# Patient Record
Sex: Male | Born: 1955 | Race: White | Hispanic: No | Smoking: Current some day smoker
Health system: Southern US, Community
[De-identification: ages and names within clinical notes are randomized; demographics above are authoritative.]

## PROBLEM LIST (undated history)

## (undated) DIAGNOSIS — L089 Local infection of the skin and subcutaneous tissue, unspecified: Secondary | ICD-10-CM

## (undated) DIAGNOSIS — C679 Malignant neoplasm of bladder, unspecified: Secondary | ICD-10-CM

## (undated) DIAGNOSIS — Z433 Encounter for attention to colostomy: Secondary | ICD-10-CM

## (undated) HISTORY — PX: ABDOMINAL SURGERY: SHX537

---

## 2019-07-24 ENCOUNTER — Other Ambulatory Visit: Payer: Self-pay

## 2019-07-24 DIAGNOSIS — Z20822 Contact with and (suspected) exposure to covid-19: Secondary | ICD-10-CM

## 2019-07-25 LAB — NOVEL CORONAVIRUS, NAA: SARS-CoV-2, NAA: NOT DETECTED

## 2019-10-17 ENCOUNTER — Emergency Department (HOSPITAL_COMMUNITY): Payer: Medicaid Other

## 2019-10-17 ENCOUNTER — Emergency Department (HOSPITAL_COMMUNITY)
Admission: EM | Admit: 2019-10-17 | Discharge: 2019-10-17 | Disposition: A | Discharge status: Home / Self Care | Payer: Medicaid Other | Attending: Emergency Medicine | Admitting: Emergency Medicine

## 2019-10-17 ENCOUNTER — Encounter (HOSPITAL_COMMUNITY): Payer: Self-pay

## 2019-10-17 ENCOUNTER — Other Ambulatory Visit: Payer: Self-pay

## 2019-10-17 DIAGNOSIS — Z590 Homelessness: Secondary | ICD-10-CM | POA: Diagnosis not present

## 2019-10-17 DIAGNOSIS — L97521 Non-pressure chronic ulcer of other part of left foot limited to breakdown of skin: Secondary | ICD-10-CM | POA: Insufficient documentation

## 2019-10-17 DIAGNOSIS — F17210 Nicotine dependence, cigarettes, uncomplicated: Secondary | ICD-10-CM | POA: Insufficient documentation

## 2019-10-17 DIAGNOSIS — Z433 Encounter for attention to colostomy: Secondary | ICD-10-CM | POA: Insufficient documentation

## 2019-10-17 DIAGNOSIS — S81802A Unspecified open wound, left lower leg, initial encounter: Secondary | ICD-10-CM

## 2019-10-17 MED ORDER — CLOTRIMAZOLE 1 % EX CREA
TOPICAL_CREAM | Freq: Two times a day (BID) | CUTANEOUS | Status: DC
  Administered 2019-10-17: 1 via TOPICAL
  Filled 2019-10-17: qty 15

## 2019-10-17 MED ORDER — HYDROCODONE-ACETAMINOPHEN 5-325 MG PO TABS
1.0000 | ORAL_TABLET | Freq: Once | ORAL | Status: AC
  Administered 2019-10-17: 19:00:00 1 via ORAL
  Filled 2019-10-17: qty 1

## 2019-10-17 MED ORDER — MUPIROCIN CALCIUM 2 % EX CREA
TOPICAL_CREAM | Freq: Two times a day (BID) | CUTANEOUS | Status: DC
  Administered 2019-10-17: 1 via TOPICAL
  Filled 2019-10-17: qty 15

## 2019-10-17 NOTE — ED Provider Notes (Signed)
[place] DEPT Provider Note   CSN: [id number] Arrival date & time: 10/17/19  1627     History Chief Complaint  Patient presents with  . Foot Pain  . Wound Check  . Colostomy Bag Change    Jamie Wilson is a 63 y.o. male.  Patient is a 63 year old male with a history of prior ostomy with colostomy bag, chronic lower extremity wounds who presents today for worsening wound on the left foot. Patient states that he was in prison until July and then has been out since then. In the last month his living situation has changed and he went to the [facility] which helped him get into a hotel however he has had difficult time paying and now he is back on the street sleeping in his wheelchair. Since this time he has not been able to do regular dressing changes of his foot and the wound is starting to get worse. He was initially using Neosporin and changing the dressing twice a day and it was almost completely healed. Also he has run out of ostomy care items and called multiple pharmacies but was unable to get any further ostomy bags. He denies any redness of the foot that radiates up but does have foot pain. He has had no fever, vomiting or abdominal pain. He has felt more tired in the last week or 2 but states he has been eating very little. He does plan to go to the [facility] on Monday so that they can hold his things as he currently has nowhere to stay. He is supposed to be on chronic medications but states he is not been on them for some time because he ran out. He has not seen Marliss Coots at [facility] for medications.  The history is provided by the patient.  Foot Pain This is a recurrent problem. Episode onset: wound has worsened over the last week. The problem occurs constantly. The problem has been gradually worsening. Associated symptoms comments: No fever, vomiting but feeling a little more tired. The symptoms are aggravated by bending and twisting. Nothing relieves the symptoms.  He has tried nothing for the symptoms.  Wound Check       History reviewed. No pertinent past medical history.  There are no problems to display for this patient.   History reviewed. No pertinent surgical history.     History reviewed. No pertinent family history.  Social History   Tobacco Use  . Smoking status: Current Some Day Smoker  . Smokeless tobacco: Never Used  Substance Use Topics  . Alcohol use: Not on file  . Drug use: Not on file    Home Medications Prior to Admission medications   Not on File    Allergies    Patient has no known allergies.  Review of Systems   Review of Systems  All other systems reviewed and are negative.   Physical Exam Updated Vital Signs BP 105/62 (BP Location: Left Arm)   Pulse 64   Temp 97.9 F (36.6 C) (Oral)   Resp 18   Ht 6\' 1"  (1.854 m)   Wt 72.6 kg   SpO2 96%   BMI 21.11 kg/m   Physical Exam Vitals and nursing note reviewed.  Constitutional:      General: He is not in acute distress.    Appearance: He is well-developed and normal weight.  HENT:     Head: Normocephalic and atraumatic.     Mouth/Throat:     Mouth: Mucous  membranes are moist.  Eyes:     Conjunctiva/sclera: Conjunctivae normal.     Pupils: Pupils are equal, round, and reactive to light.  Cardiovascular:     Rate and Rhythm: Normal rate and regular rhythm.     Heart sounds: No murmur.  Pulmonary:     Effort: Pulmonary effort is normal. No respiratory distress.     Breath sounds: Normal breath sounds. No wheezing or rales.  Abdominal:     General: There is no distension.     Palpations: Abdomen is soft.     Tenderness: There is no abdominal tenderness. There is no guarding or rebound.     Comments: Ostomy present without bag. Mild surrounding redness around ostomy site but no tenderness or induration.  Musculoskeletal:        General: No tenderness. Normal range of motion.     Cervical back: Normal range of motion and neck supple.      Right lower leg: Edema present.     Comments: Right foot swelling and edema without wounds present. On the left foot over the second and third toe there is an ulcerative lesion with malodorous skin breakdown between the toes. No significant drainage present. 2+ left DP pulse but foot is cold to touch. Capillary refill of 2-3  Skin:    General: Skin is warm and dry.     Findings: No erythema or rash.  Neurological:     General: No focal deficit present.     Mental Status: He is alert and oriented to person, place, and time. Mental status is at baseline.  Psychiatric:        Mood and Affect: Mood normal.        Behavior: Behavior normal.        Thought Content: Thought content normal.     ED Results / Procedures / Treatments   Labs (all labs ordered are listed, but only abnormal results are displayed) Labs Reviewed - No data to display  EKG None  Radiology No results found.  Procedures Procedures (including critical care time)  Medications Ordered in ED Medications  mupirocin cream (BACTROBAN) 2 % (has no administration in time range)  clotrimazole (LOTRIMIN) 1 % cream (has no administration in time range)    ED Course  I have reviewed the triage vital signs and the nursing notes.  Pertinent labs & imaging results that were available during my care of the patient were reviewed by me and considered in my medical decision making (see chart for details).    MDM Rules/Calculators/A&P                      63 year old male who is currently homeless coming in by EMS today for worsening wound on his left foot. 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 He has no evidence of cellulitis at this time and lower suspicion for osteomyelitis. There is no sign of abscess and patient does have palpable pulse with low suspicion for ischemic limb. Patient states the wound was almost healed until he lost the place he was  living and has not been providing good wound care to the area because he has been homeless. Patient is also out of ostomy supplies and has no further bags for his ostomy site. Patient is overall well-appearing. He has been seen at the [facility] and they helped him get a hotel and he plans to go  back there on Monday but has not seen the nurse practitioner there for his ongoing medical needs. Will discuss with social work to see if there is anything further we can offer the patient. A new dressing was placed on his wound with mupirocin and Lotrimin. X-rays negative for osteomyelitis. New ostomy supplies provided and placed. Patient has normal vital signs and low suspicion for acute underlying process.  6:44 PM Social work is not available till Architectural technologist.  Patient was given a list of shelters was discharged to the waiting room.  Patient plans to go to the [facility] on Monday and at the first of the year has plan to go to a boardinghouse.  No admittable findings today  Final Clinical Impression(s) / ED Diagnoses Final diagnoses:  Wound of left lower extremity, initial encounter    Rx / DC Orders ED Discharge Orders    None       Blanchie Dessert, MD 10/17/19 1846

## 2019-10-17 NOTE — ED Triage Notes (Signed)
EMS reports from hotel, worsening chronic wound to top of left foot. Unable to change colostomy bag.  BP 156/88 HR 88 RR 16 Sp02 95 RA Temp 97.0

## 2019-10-17 NOTE — Discharge Instructions (Addendum)
Apply the ointment 2 times a day and change the dressing to keep clean and dry.  Go to the [facility] with help for housing.

## 2019-10-18 NOTE — Progress Notes (Addendum)
CSW was called by the RN/AC to assit pt who is D/C'd.  CSW consulted Advance Care Supervisor who stated pt could go via taxi to [employer] [facility] [employer]. RN/CN updated and pt will go as soon as the taxi arrives.  Advanced Care Supervisor approved the $33 voucher.  CSW will continue to follow for D/C needs.  Alphonse Guild. Fleda Pagel, LCSW, LCAS, CSI Transitions of Care Clinical Social Worker Care Coordination Department Ph: [phone]

## 2019-11-21 ENCOUNTER — Emergency Department (HOSPITAL_COMMUNITY): Payer: Medicaid Other

## 2019-11-21 ENCOUNTER — Other Ambulatory Visit: Payer: Self-pay

## 2019-11-21 ENCOUNTER — Emergency Department (HOSPITAL_COMMUNITY)
Admission: EM | Admit: 2019-11-21 | Discharge: 2019-11-21 | Disposition: A | Discharge status: Home / Self Care | Payer: Medicaid Other | Attending: Emergency Medicine | Admitting: Emergency Medicine

## 2019-11-21 DIAGNOSIS — M79672 Pain in left foot: Secondary | ICD-10-CM | POA: Diagnosis not present

## 2019-11-21 DIAGNOSIS — F17210 Nicotine dependence, cigarettes, uncomplicated: Secondary | ICD-10-CM | POA: Insufficient documentation

## 2019-11-21 LAB — CBC WITH DIFFERENTIAL/PLATELET
Abs Immature Granulocytes: 0.03 10*3/uL (ref 0.00–0.07)
Basophils Absolute: 0 10*3/uL (ref 0.0–0.1)
Basophils Relative: 0 %
Eosinophils Absolute: 0.1 10*3/uL (ref 0.0–0.5)
Eosinophils Relative: 1 %
HCT: 46.9 % (ref 39.0–52.0)
Hemoglobin: 14.9 g/dL (ref 13.0–17.0)
Immature Granulocytes: 0 %
Lymphocytes Relative: 15 %
Lymphs Abs: 1.4 10*3/uL (ref 0.7–4.0)
MCH: 28.7 pg (ref 26.0–34.0)
MCHC: 31.8 g/dL (ref 30.0–36.0)
MCV: 90.4 fL (ref 80.0–100.0)
Monocytes Absolute: 0.5 10*3/uL (ref 0.1–1.0)
Monocytes Relative: 6 %
Neutro Abs: 7.4 10*3/uL (ref 1.7–7.7)
Neutrophils Relative %: 78 %
Platelets: 305 10*3/uL (ref 150–400)
RBC: 5.19 MIL/uL (ref 4.22–5.81)
RDW: 14.9 % (ref 11.5–15.5)
WBC: 9.4 10*3/uL (ref 4.0–10.5)
nRBC: 0 % (ref 0.0–0.2)

## 2019-11-21 LAB — BASIC METABOLIC PANEL
Anion gap: 10 (ref 5–15)
BUN: 22 mg/dL (ref 8–23)
CO2: 21 mmol/L — ABNORMAL LOW (ref 22–32)
Calcium: 9.4 mg/dL (ref 8.9–10.3)
Chloride: 104 mmol/L (ref 98–111)
Creatinine, Ser: 1.04 mg/dL (ref 0.61–1.24)
GFR calc Af Amer: >60 mL/min (ref >60)
GFR calc non Af Amer: >60 mL/min (ref >60)
Glucose, Bld: 100 mg/dL — ABNORMAL HIGH (ref 70–99)
Potassium: 3.9 mmol/L (ref 3.5–5.1)
Sodium: 135 mmol/L (ref 135–145)

## 2019-11-21 MED ORDER — SODIUM CHLORIDE 0.9 % IV SOLN: INTRAVENOUS | Status: DC

## 2019-11-21 NOTE — ED Provider Notes (Signed)
[place] DEPT Provider Note   CSN: [id number] Arrival date & time: 11/21/19  1334     History Chief Complaint  Patient presents with  . colostomy bag  . Foot Pain    Casten Gregorich is a 64 y.o. male.  64 year old male presents requesting a new colostomy bag.  Patient states he has had trouble getting adequate supplies.  Also has a secondary complaint of wound to his left foot.  States he has been doing self-care to this area by wrapping it.  Was seen at wound care in the past but due to insurance issues has not been able to go.  Denies any fever or chills.  No abdominal pain.        No past medical history on file.  There are no problems to display for this patient.   No past surgical history on file.     No family history on file.  Social History   Tobacco Use  . Smoking status: Current Some Day Smoker  . Smokeless tobacco: Never Used  Substance Use Topics  . Alcohol use: Not on file  . Drug use: Not on file    Home Medications Prior to Admission medications   Not on File    Allergies    Patient has no known allergies.  Review of Systems   Review of Systems  All other systems reviewed and are negative.   Physical Exam Updated Vital Signs BP 115/77 (BP Location: Left Arm)   Pulse (!) 105   Temp 98 F (36.7 C) (Oral)   Resp 18   SpO2 94%   Physical Exam Vitals and nursing note reviewed.  Constitutional:      General: He is not in acute distress.    Appearance: Normal appearance. He is well-developed. He is not toxic-appearing.  HENT:     Head: Normocephalic and atraumatic.  Eyes:     General: Lids are normal.     Conjunctiva/sclera: Conjunctivae normal.     Pupils: Pupils are equal, round, and reactive to light.  Neck:     Thyroid: No thyroid mass.     Trachea: No tracheal deviation.  Cardiovascular:     Rate and Rhythm: Normal rate and regular rhythm.     Heart sounds: Normal heart sounds. No murmur. No  gallop.   Pulmonary:     Effort: Pulmonary effort is normal. No respiratory distress.     Breath sounds: Normal breath sounds. No stridor. No decreased breath sounds, wheezing, rhonchi or rales.  Abdominal:     General: Bowel sounds are normal. There is no distension.     Palpations: Abdomen is soft.     Tenderness: There is no abdominal tenderness. There is no rebound.    Musculoskeletal:        General: No tenderness. Normal range of motion.     Cervical back: Normal range of motion and neck supple.  Skin:    General: Skin is warm and dry.     Findings: No abrasion or rash.  Neurological:     Mental Status: He is alert and oriented to person, place, and time.     GCS: GCS eye subscore is 4. GCS verbal subscore is 5. GCS motor subscore is 6.     Cranial Nerves: No cranial nerve deficit.     Sensory: No sensory deficit.  Psychiatric:        Speech: Speech normal.        Behavior: Behavior normal.  ED Results / Procedures / Treatments   Labs (all labs ordered are listed, but only abnormal results are displayed) Labs Reviewed - No data to display  EKG None  Radiology No results found.  Procedures Procedures (including critical care time)  Medications Ordered in ED Medications - No data to display  ED Course  I have reviewed the triage vital signs and the nursing notes.  Pertinent labs & imaging results that were available during my care of the patient were reviewed by me and considered in my medical decision making (see chart for details).    MDM Rules/Calculators/A&P                      Patient's colostomy bag replaced here.  Patient had negative x-rays of his left foot.  Upon further close exam there is no signs of ischemia to his second toe.  He has good sensation at the distal tip of the second toe on the left foot.  Does have some chronic skin breakdown.  We will have nurse dressed his wound and will give patient referral to the [facility] wound care  clinic as well as to the community [facility] center. Final Clinical Impression(s) / ED Diagnoses Final diagnoses:  None    Rx / DC Orders ED Discharge Orders    None       Lacretia Leigh, MD 11/21/19 1624

## 2019-11-21 NOTE — ED Notes (Signed)
Dressing applied to 2nd and 3rd toes on left foot.

## 2019-11-21 NOTE — ED Notes (Signed)
RN assisted patient with cleaning ostomy site, removing [employer] bag and cup and replacing it with appropriate ostomy bag. Ostomy stoma looks healthy, with no obvious signs of infection or abnormality.

## 2019-11-21 NOTE — ED Triage Notes (Signed)
Patient reports pain in left foot, patient reports previous infection.  Patient reports colostomy issues and has not had a bag to fit on the ostomy in a while

## 2019-11-21 NOTE — ED Notes (Signed)
Sign pad not working when pt signed, verbalized understanding instructions

## 2019-11-21 NOTE — ED Notes (Signed)
Pt not interested in leaving due to having to wait for ride, encouraged to get dressed so he can wait in lobby for ride. Sandwich and juice given upon going to lobby.

## 2019-12-13 ENCOUNTER — Emergency Department (HOSPITAL_COMMUNITY)
Admission: EM | Admit: 2019-12-13 | Discharge: 2019-12-13 | Disposition: A | Discharge status: Home / Self Care | Payer: Medicaid Other | Attending: Emergency Medicine | Admitting: Emergency Medicine

## 2019-12-13 ENCOUNTER — Encounter (HOSPITAL_COMMUNITY): Payer: Self-pay | Admitting: *Deleted

## 2019-12-13 ENCOUNTER — Emergency Department (HOSPITAL_COMMUNITY): Payer: Medicaid Other

## 2019-12-13 ENCOUNTER — Other Ambulatory Visit: Payer: Self-pay

## 2019-12-13 DIAGNOSIS — Z5189 Encounter for other specified aftercare: Secondary | ICD-10-CM

## 2019-12-13 DIAGNOSIS — Z4800 Encounter for change or removal of nonsurgical wound dressing: Secondary | ICD-10-CM | POA: Diagnosis not present

## 2019-12-13 DIAGNOSIS — Z433 Encounter for attention to colostomy: Secondary | ICD-10-CM

## 2019-12-13 DIAGNOSIS — F17200 Nicotine dependence, unspecified, uncomplicated: Secondary | ICD-10-CM | POA: Insufficient documentation

## 2019-12-13 HISTORY — DX: Local infection of the skin and subcutaneous tissue, unspecified: L08.9

## 2019-12-13 HISTORY — DX: Encounter for attention to colostomy: Z43.3

## 2019-12-13 NOTE — Discharge Instructions (Addendum)
Please call the wound care clinic now that you have your Medicaid insurance to establish an appointment for ongoing management of your left foot wound.  Please also get established with a primary care provider for ongoing evaluation and management of your health and wellbeing.   Please return to the ED or seek immediate medical attention for any new or worsening symptoms.

## 2019-12-13 NOTE — ED Provider Notes (Signed)
[place] DEPT Provider Note   CSN: [id number] Arrival date & time: 12/13/19  1416     History Chief Complaint  Patient presents with  . Out of Colostomy supplies  . Wound Eval    Jamie Wilson is a 64 y.o. male with no significant PMH outside of colostomy care and a left foot infection who presents to the ED for colostomy supplies and wound check.  Patient reported to the ED for these exact symptoms on 11/21/2019 with plan of wound care follow-up and establishing with a primary care provider.  However, patient reports that he just received his Medicaid card in the past few days and plans to establish care in the next week.  In the interim however, he has been washing and reusing his colostomy bags and is requesting supply replacement.  As for his left foot, he feels as though it has improved, however is still concerned about the possible infection.  He denies any fevers or chills, headache or dizziness, numbness or weakness, urinary abdominal pain, nausea vomiting, or other symptoms.  HPI     Past Medical History:  Diagnosis Date  . Colostomy care ([place])   . Toe infection     There are no problems to display for this patient.   Past Surgical History:  Procedure Laterality Date  . ABDOMINAL SURGERY         No family history on file.  Social History   Tobacco Use  . Smoking status: Current Some Day Smoker  . Smokeless tobacco: Never Used  Substance Use Topics  . Alcohol use: Yes  . Drug use: Yes    Types: Marijuana    Home Medications Prior to Admission medications   Not on File    Allergies    Patient has no known allergies.  Review of Systems   Review of Systems  All other systems reviewed and are negative.   Physical Exam Updated Vital Signs BP (!) 142/62 (BP Location: Right Arm)   Pulse 76   Temp 98.5 F (36.9 C) (Oral)   Resp 16   SpO2 98%   Physical Exam Vitals and nursing note reviewed. Exam conducted with a  chaperone present.  Constitutional:      General: He is not in acute distress.    Appearance: Normal appearance. He is not ill-appearing.     Comments: Malodorous.  HENT:     Head: Normocephalic and atraumatic.  Eyes:     General: No scleral icterus.    Conjunctiva/sclera: Conjunctivae normal.  Cardiovascular:     Rate and Rhythm: Normal rate and regular rhythm.     Pulses: Normal pulses.     Heart sounds: Normal heart sounds.  Pulmonary:     Effort: Pulmonary effort is normal. No respiratory distress.     Breath sounds: Normal breath sounds.  Abdominal:     Comments: Colostomy bag.  Ostomy site with appears noninfected.  No TTP.  Mildly distended abdomen.  No rigidity.  No obvious masses appreciated.  Musculoskeletal:     Comments: Left leg: Skin is flaking.  Sensation intact throughout.  Pedal pulse intact.  Difficult to assess for cap refill however given discolored toenails.  No TTP.  No swelling.  Skin:    General: Skin is dry.     Capillary Refill: Capillary refill takes less than 2 seconds.  Neurological:     Mental Status: He is alert and oriented to person, place, and time.     GCS: GCS  eye subscore is 4. GCS verbal subscore is 5. GCS motor subscore is 6.  Psychiatric:        Mood and Affect: Mood normal.        Behavior: Behavior normal.        Thought Content: Thought content normal.         ED Results / Procedures / Treatments   Labs (all labs ordered are listed, but only abnormal results are displayed) Labs Reviewed - No data to display  EKG None  Radiology DG Foot Complete Left  Result Date: 12/13/2019 CLINICAL DATA:  Healed ulcer left ankle, sores between digits EXAM: LEFT FOOT - COMPLETE 3+ VIEW COMPARISON:  11/21/2019 FINDINGS: Frontal, oblique, lateral views of the left foot are again obtained. There is diffuse osteopenia unchanged. No acute or destructive bony lesions. Specifically, no bony destruction or periosteal reaction to suggest osteomyelitis.  Soft tissues are unremarkable. IMPRESSION: 1. Diffuse osteopenia. 2. No radiographic evidence of osteomyelitis. If osteomyelitis remains a clinical concern, three-phase bone scan or MRI could be considered. Electronically Signed   By: Randa Ngo M.D.   On: 12/13/2019 16:02    Procedures Procedures (including critical care time)  Medications Ordered in ED Medications - No data to display  ED Course  I have reviewed the triage vital signs and the nursing notes.  Pertinent labs & imaging results that were available during my care of the patient were reviewed by me and considered in my medical decision making (see chart for details).    MDM Rules/Calculators/A&P                      Patient has chronic skin breakdown over left foot, however plain films obtained were negative for soft tissue gas or other concerning findings for osteomyelitis.  Suspect possible onychomycosis, but given chronicity of wound, patient will need to be evaluated by wound care for ongoing evaluation and management.  His colostomy bag and required accessories have been replaced and he was provided with additional bags.    Given that he now has his Medicaid card, he reports that he should be able to establish care with a primary care provider for ongoing care and management.  Do not suspect any acute pathology this time.  Patient is resting comfortably and in no acute distress.  No significant complaints aside from his colostomy bag concern and desire for wound recheck.  DG left foot complete demonstrates no radiographic evidence of osteomyelitis.    Strict ED return precautions discussed with the patient.  All of the evaluation and work-up results were discussed with the patient and any family at bedside. They were provided opportunity to ask any additional questions and have none at this time. They have expressed understanding of verbal discharge instructions as well as return precautions and are agreeable to the plan.      Final Clinical Impression(s) / ED Diagnoses Final diagnoses:  Colostomy care [facility])  Visit for wound check    Rx / DC Orders ED Discharge Orders    None       Corena Herter, PA-C 12/13/19 1702    Dorie Rank, MD 12/15/19 1047

## 2019-12-13 NOTE — ED Triage Notes (Signed)
Pt out of colostomy supplies and needs wounds on left toes checked

## 2020-04-03 ENCOUNTER — Encounter (HOSPITAL_COMMUNITY): Payer: Self-pay | Admitting: *Deleted

## 2020-04-03 ENCOUNTER — Emergency Department (HOSPITAL_COMMUNITY)
Admission: EM | Admit: 2020-04-03 | Discharge: 2020-04-03 | Disposition: A | Discharge status: Home / Self Care | Payer: Medicaid Other | Attending: Emergency Medicine | Admitting: Emergency Medicine

## 2020-04-03 ENCOUNTER — Other Ambulatory Visit: Payer: Self-pay

## 2020-04-03 DIAGNOSIS — Z720 Tobacco use: Secondary | ICD-10-CM | POA: Diagnosis not present

## 2020-04-03 DIAGNOSIS — Z433 Encounter for attention to colostomy: Secondary | ICD-10-CM | POA: Insufficient documentation

## 2020-04-03 DIAGNOSIS — Z5189 Encounter for other specified aftercare: Secondary | ICD-10-CM

## 2020-04-03 NOTE — ED Triage Notes (Signed)
The [place] was [place] in by [facility] [place] the city  Ambulatory  hes here because he ran out of colostomy bags

## 2020-04-03 NOTE — Discharge Instructions (Signed)
Be sure to follow-up with your physician for further care or return here for any concerning changes in your condition.

## 2020-04-03 NOTE — ED Notes (Signed)
Pt requesting colostomy bag and wafer.  Same obtained from materials and given to patient.  Pt then changed bag.

## 2020-04-03 NOTE — ED Provider Notes (Signed)
°  [place] EMERGENCY DEPARTMENT Provider Note   CSN: [id number] Arrival date & time: 04/03/20  1821     History Chief Complaint  Patient presents with   needs colostomy bags    Jamie Wilson is a 64 y.o. male.  HPI    Patient presents with request for assistance with obtaining colostomy bags. Patient has colostomy, ran out of bags.  He has no complaints, states that that is his only request. It is unclear when he ran out of asked, but he is currently using a plastic sac.  Past Medical History:  Diagnosis Date   Colostomy care (Jamie Wilson)    Toe infection     There are no problems to display for this patient.   Past Surgical History:  Procedure Laterality Date   ABDOMINAL SURGERY         No family history on file.  Social History   Tobacco Use   Smoking status: Current Some Day Smoker   Smokeless tobacco: Never Used  Substance Use Topics   Alcohol use: Yes   Drug use: Yes    Types: Marijuana    Home Medications Prior to Admission medications   Not on File    Allergies    Patient has no known allergies.  Review of Systems   Review of Systems  Constitutional: Negative for fever.  Gastrointestinal: Negative for vomiting.  Musculoskeletal:       Negative aside from HPI  Skin:       Wound as above    Physical Exam Updated Vital Signs Ht 6\' 1"  (1.854 m)    Wt 72.6 kg    BMI 21.11 kg/m   Physical Exam Vitals and nursing note reviewed.  Constitutional:      Appearance: He is not ill-appearing or diaphoretic.  HENT:     Head: Normocephalic and atraumatic.  Eyes:     General:        Right eye: No discharge.        Left eye: No discharge.  Musculoskeletal:        General: No deformity.  Skin:    Coloration: Skin is not jaundiced.  Neurological:     Mental Status: He is alert.  Psychiatric:        Mood and Affect: Mood normal.        Behavior: Behavior normal.     Procedures Procedures (including critical care  time)  Medications Ordered in ED Medications - No data to display  ED Course  I have reviewed the triage vital signs and the nursing notes.  Pertinent labs & imaging results that were available during my care of the patient were reviewed by me and considered in my medical decision making (see chart for details).   Patient with a history of colostomy now presents with request for assistance with wound care.  Patient has no complaints, specifically denying any other issues, concerns, requests.  On our exam he is awake, alert, sitting upright, speaking clearly, in no distress, hemodynamically unremarkable. Given his denial of other complaints, absence of hemodynamic instability or distress, patient's request was accommodated with assistance of nursing staff and he was discharged with referral to our wound care center for further care.   Final Clinical Impression(s) / ED Diagnoses Final diagnoses:  Encounter for wound care     Carmin Muskrat, MD 04/03/20 2051

## 2020-06-26 ENCOUNTER — Encounter (HOSPITAL_COMMUNITY): Payer: Self-pay | Admitting: Emergency Medicine

## 2020-06-26 ENCOUNTER — Emergency Department (HOSPITAL_COMMUNITY)
Admission: EM | Admit: 2020-06-26 | Discharge: 2020-06-27 | Disposition: A | Discharge status: Home / Self Care | Payer: Medicaid Other | Attending: Emergency Medicine | Admitting: Emergency Medicine

## 2020-06-26 ENCOUNTER — Other Ambulatory Visit: Payer: Self-pay

## 2020-06-26 DIAGNOSIS — R300 Dysuria: Secondary | ICD-10-CM | POA: Diagnosis not present

## 2020-06-26 DIAGNOSIS — N3000 Acute cystitis without hematuria: Secondary | ICD-10-CM

## 2020-06-26 DIAGNOSIS — Z933 Colostomy status: Secondary | ICD-10-CM | POA: Insufficient documentation

## 2020-06-26 DIAGNOSIS — F17200 Nicotine dependence, unspecified, uncomplicated: Secondary | ICD-10-CM | POA: Diagnosis not present

## 2020-06-26 NOTE — ED Triage Notes (Addendum)
Pt states his colostomy bag fell off last night and he is using a trash bag.  States his landlord ordered more supplies but they won't come until next week.  Also reports burning when he self caths.  Denies fever and chills.

## 2020-06-26 NOTE — ED Notes (Signed)
Called Pt for room multiple times and no answer

## 2020-06-27 ENCOUNTER — Encounter (HOSPITAL_COMMUNITY): Payer: Self-pay | Admitting: Emergency Medicine

## 2020-06-27 LAB — URINALYSIS, ROUTINE W REFLEX MICROSCOPIC
Bilirubin Urine: NEGATIVE
Glucose, UA: NEGATIVE mg/dL
Ketones, ur: 5 mg/dL — AB
Nitrite: NEGATIVE
Protein, ur: >=300 mg/dL — AB
Specific Gravity, Urine: 1.015 (ref 1.005–1.030)
pH: 7 (ref 5.0–8.0)

## 2020-06-27 MED ORDER — CEPHALEXIN 500 MG PO CAPS: 500.0000 mg | ORAL_CAPSULE | Freq: Four times a day (QID) | ORAL | 0 refills | Status: DC

## 2020-06-27 MED ORDER — CEPHALEXIN 250 MG PO CAPS
500.0000 mg | ORAL_CAPSULE | Freq: Once | ORAL | Status: DC
  Filled 2020-06-27: qty 2

## 2020-06-27 NOTE — ED Notes (Signed)
Pt given ostomy supplies.

## 2020-06-27 NOTE — ED Provider Notes (Signed)
[facility] EMERGENCY DEPARTMENT Provider Note   CSN: [id number] Arrival date & time: 06/26/20  1314     History Chief Complaint  Patient presents with  . COLOSTOMY SUPPLIES  . burning with urination    Jamie Wilson is a 64 y.o. male.  The history is provided by the patient.  Illness Location:  Ostomy  Quality:  Has not supplies to cover the ostomy  Severity:  Mild Onset quality:  Gradual Duration:  1 day Timing:  Constant Progression:  Unchanged Chronicity:  New Context:  Also has burning with urination  Relieved by:  Nothing Worsened by:  Nothing  Ineffective treatments:  None tried  Associated symptoms: no abdominal pain, no chest pain, no congestion, no cough, no diarrhea, no ear pain, no fatigue, no fever, no headaches, no loss of consciousness, no myalgias, no nausea, no rash, no rhinorrhea, no shortness of breath, no sore throat, no vomiting and no wheezing   Risk factors:  Patient self catheterizes       Past Medical History:  Diagnosis Date  . Colostomy care ([place])   . Toe infection     There are no problems to display for this patient.   Past Surgical History:  Procedure Laterality Date  . ABDOMINAL SURGERY         History reviewed. No pertinent family history.  Social History   Tobacco Use  . Smoking status: Current Some Day Smoker  . Smokeless tobacco: Never Used  Substance Use Topics  . Alcohol use: Yes  . Drug use: Yes    Types: Marijuana    Home Medications Prior to Admission medications   Not on File    Allergies    Patient has no known allergies.  Review of Systems   Review of Systems  Constitutional: Negative for fatigue and fever.  HENT: Negative for congestion, ear pain, rhinorrhea and sore throat.   Eyes: Negative for visual disturbance.  Respiratory: Negative for cough, shortness of breath and wheezing.   Cardiovascular: Negative for chest pain.  Gastrointestinal: Negative for abdominal pain,  diarrhea, nausea and vomiting.  Genitourinary: Positive for dysuria.  Musculoskeletal: Negative for myalgias.  Skin: Negative for rash.  Neurological: Negative for loss of consciousness and headaches.  Psychiatric/Behavioral: Negative for agitation.  All other systems reviewed and are negative.   Physical Exam Updated Vital Signs BP 116/79 (BP Location: Left Arm)   Pulse (!) 108   Temp 98.2 F (36.8 C) (Oral)   Resp 16   Ht 6\' 1"  (1.854 m)   Wt 68 kg   SpO2 95%   BMI 19.79 kg/m   Physical Exam Vitals and nursing note reviewed.  Constitutional:      General: He is not in acute distress.    Appearance: Normal appearance.  HENT:     Head: Normocephalic and atraumatic.     Nose: Nose normal.  Eyes:     Conjunctiva/sclera: Conjunctivae normal.     Pupils: Pupils are equal, round, and reactive to light.  Cardiovascular:     Rate and Rhythm: Normal rate and regular rhythm.     Pulses: Normal pulses.     Heart sounds: Normal heart sounds.  Pulmonary:     Effort: Pulmonary effort is normal.     Breath sounds: Normal breath sounds.  Abdominal:     General: Abdomen is flat. Bowel sounds are normal.     Palpations: Abdomen is soft.     Tenderness: There is no abdominal tenderness.  Musculoskeletal:        General: Normal range of motion.     Cervical back: Normal range of motion and neck supple.  Skin:    General: Skin is warm and dry.     Capillary Refill: Capillary refill takes less than 2 seconds.  Neurological:     General: No focal deficit present.     Mental Status: He is alert and oriented to person, place, and time.  Psychiatric:        Mood and Affect: Mood normal.        Behavior: Behavior normal.     ED Results / Procedures / Treatments   Labs (all labs ordered are listed, but only abnormal results are displayed) Results for orders placed or performed during the hospital encounter of 06/26/20  Urinalysis, Routine w reflex microscopic Urine, Catheterized    Result Value Ref Range   Color, Urine AMBER (A) YELLOW   APPearance CLOUDY (A) CLEAR   Specific Gravity, Urine 1.015 1.005 - 1.030   pH 7.0 5.0 - 8.0   Glucose, UA NEGATIVE NEGATIVE mg/dL   Hgb urine dipstick SMALL (A) NEGATIVE   Bilirubin Urine NEGATIVE NEGATIVE   Ketones, ur 5 (A) NEGATIVE mg/dL   Protein, ur >=300 (A) NEGATIVE mg/dL   Nitrite NEGATIVE NEGATIVE   Leukocytes,Ua TRACE (A) NEGATIVE   RBC / HPF 21-50 0 - 5 RBC/hpf   WBC, UA 0-5 0 - 5 WBC/hpf   Bacteria, UA RARE (A) NONE SEEN   Squamous Epithelial / LPF 0-5 0 - 5   WBC Clumps PRESENT    Mucus PRESENT    No results found.  Radiology No results found.  Procedures Procedures (including critical care time)  Medications Ordered in ED Medications - No data to display  ED Course  I have reviewed the triage vital signs and the nursing notes.  Pertinent labs & imaging results that were available during my care of the patient were reviewed by me and considered in my medical decision making (see chart for details).  Supplies given in the ED.    Jamie Wilson was evaluated in Emergency Department on 06/27/2020 for the symptoms described in the history of present illness. He was evaluated in the context of the global COVID-19 pandemic, which necessitated consideration that the patient might be at risk for infection with the SARS-CoV-2 virus that causes COVID-19. Institutional protocols and algorithms that pertain to the evaluation of patients at risk for COVID-19 are in a state of rapid change based on information released by regulatory bodies including the CDC and federal and state organizations. These policies and algorithms were followed during the patient's care in the ED.  Final Clinical Impression(s) / ED Diagnoses Return for intractable cough, coughing up blood,fevers >100.4 unrelieved by medication, shortness of breath, intractable vomiting, chest pain, shortness of breath, weakness,numbness, changes in speech,  facial asymmetry,abdominal pain, passing out,Inability to tolerate liquids or food, cough, altered mental status or any concerns. No signs of systemic illness or infection. The patient is nontoxic-appearing on exam and vital signs are within normal limits.   I have reviewed the triage vital signs and the nursing notes. Pertinent labs &imaging results that were available during my care of the patient were reviewed by me and considered in my medical decision making (see chart for details).After history, exam, and medical workup I feel the patient has beenappropriately medically screened and is safe for discharge home. Pertinent diagnoses were discussed with the patient. Patient was given return precautions.  Alianys Chacko, MD 06/27/20 0511

## 2020-07-20 ENCOUNTER — Emergency Department (HOSPITAL_COMMUNITY)
Admission: EM | Admit: 2020-07-20 | Discharge: 2020-07-20 | Disposition: A | Discharge status: Home / Self Care | Payer: Medicaid Other | Attending: Emergency Medicine | Admitting: Emergency Medicine

## 2020-07-20 ENCOUNTER — Encounter (HOSPITAL_COMMUNITY): Payer: Self-pay

## 2020-07-20 DIAGNOSIS — S40861A Insect bite (nonvenomous) of right upper arm, initial encounter: Secondary | ICD-10-CM | POA: Diagnosis not present

## 2020-07-20 DIAGNOSIS — Y9289 Other specified places as the place of occurrence of the external cause: Secondary | ICD-10-CM | POA: Diagnosis not present

## 2020-07-20 DIAGNOSIS — Y998 Other external cause status: Secondary | ICD-10-CM | POA: Insufficient documentation

## 2020-07-20 DIAGNOSIS — W57XXXA Bitten or stung by nonvenomous insect and other nonvenomous arthropods, initial encounter: Secondary | ICD-10-CM | POA: Insufficient documentation

## 2020-07-20 DIAGNOSIS — S40862A Insect bite (nonvenomous) of left upper arm, initial encounter: Secondary | ICD-10-CM | POA: Diagnosis not present

## 2020-07-20 DIAGNOSIS — N390 Urinary tract infection, site not specified: Secondary | ICD-10-CM | POA: Diagnosis not present

## 2020-07-20 DIAGNOSIS — Z433 Encounter for attention to colostomy: Secondary | ICD-10-CM | POA: Diagnosis not present

## 2020-07-20 DIAGNOSIS — S80862A Insect bite (nonvenomous), left lower leg, initial encounter: Secondary | ICD-10-CM | POA: Insufficient documentation

## 2020-07-20 DIAGNOSIS — F17200 Nicotine dependence, unspecified, uncomplicated: Secondary | ICD-10-CM | POA: Diagnosis not present

## 2020-07-20 DIAGNOSIS — S80861A Insect bite (nonvenomous), right lower leg, initial encounter: Secondary | ICD-10-CM | POA: Diagnosis not present

## 2020-07-20 DIAGNOSIS — Z933 Colostomy status: Secondary | ICD-10-CM

## 2020-07-20 DIAGNOSIS — Y9389 Activity, other specified: Secondary | ICD-10-CM | POA: Insufficient documentation

## 2020-07-20 LAB — URINALYSIS, ROUTINE W REFLEX MICROSCOPIC
Bilirubin Urine: NEGATIVE
Glucose, UA: NEGATIVE mg/dL
Ketones, ur: NEGATIVE mg/dL
Nitrite: POSITIVE — AB
Protein, ur: 100 mg/dL — AB
RBC / HPF: >50 RBC/hpf — ABNORMAL HIGH (ref 0–5)
Specific Gravity, Urine: 1.017 (ref 1.005–1.030)
WBC, UA: >50 WBC/hpf — ABNORMAL HIGH (ref 0–5)
pH: 6 (ref 5.0–8.0)

## 2020-07-20 MED ORDER — CEFTRIAXONE SODIUM 1 G IJ SOLR: 1.0000 g | Freq: Once | INTRAMUSCULAR | 0 refills | Status: AC

## 2020-07-20 MED ORDER — SULFAMETHOXAZOLE-TRIMETHOPRIM 800-160 MG PO TABS: 1.0000 | ORAL_TABLET | Freq: Two times a day (BID) | ORAL | 0 refills | Status: DC

## 2020-07-20 MED ORDER — CEFTRIAXONE SODIUM 500 MG IJ SOLR
1000.0000 mg | Freq: Once | INTRAMUSCULAR | Status: AC
  Administered 2020-07-20: 1000 mg via INTRAMUSCULAR
  Filled 2020-07-20: qty 1000

## 2020-07-20 MED ORDER — LIDOCAINE HCL (PF) 1 % IJ SOLN
INTRAMUSCULAR | Status: AC
  Administered 2020-07-20: 2 mL
  Filled 2020-07-20: qty 5

## 2020-07-20 NOTE — ED Provider Notes (Signed)
[facility] EMERGENCY DEPARTMENT Provider Note   CSN: [id number] Arrival date & time: 07/20/20  1528     History Chief Complaint  Patient presents with  . colostomy bag change  . Rash  . Dysuria    Rockne Dearinger is a 64 y.o. male.  Pt reports he has bedbug bites.  He reports he needs colostomy supplies.  Pt reports he is taking keflex for a uti but thinks it is worse.   The history is provided by the patient. No language interpreter was used.  Rash Location:  Full body Quality: itchiness   Timing:  Constant Progression:  Worsening Chronicity:  New Relieved by:  Nothing Worsened by:  Nothing Associated symptoms: no abdominal pain   Dysuria Presenting symptoms: dysuria   Associated symptoms: no abdominal pain        Past Medical History:  Diagnosis Date  . 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 Toe infection     There are no problems to display for this patient.   Past Surgical History:  Procedure Laterality Date  . ABDOMINAL SURGERY         No family history on file.  Social History   Tobacco Use  . Smoking status: Current Some Day Smoker  . Smokeless tobacco: Never Used  Substance Use Topics  . Alcohol use: Yes  . Drug use: Yes    Types: Marijuana    Home Medications Prior to Admission medications   Medication Sig Start Date End Date Taking? Authorizing Provider  cephALEXin (KEFLEX) 500 MG capsule Take 1 capsule (500 mg total) by mouth 4 (four) times daily. 06/27/20   Palumbo, April, MD    Allergies    Patient has no known allergies.  Review of Systems   Review of Systems  Gastrointestinal: Negative for abdominal pain.  Genitourinary: Positive for dysuria.  Skin: Positive for rash.  All other systems reviewed and are negative.   Physical Exam Updated Vital Signs BP 103/71   Pulse 83   Temp 98.1 F (36.7 C) (Oral)   Resp 17   SpO2 95%   Physical Exam Vitals and nursing note reviewed.  Constitutional:      Appearance: He is  well-developed.  HENT:     Head: Normocephalic and atraumatic.  Eyes:     Conjunctiva/sclera: Conjunctivae normal.  Cardiovascular:     Rate and Rhythm: Normal rate and regular rhythm.     Heart sounds: No murmur heard.   Pulmonary:     Effort: Pulmonary effort is normal. No respiratory distress.     Breath sounds: Normal breath sounds.  Abdominal:     General: Abdomen is flat.     Palpations: Abdomen is soft.     Tenderness: There is no abdominal tenderness.     Comments: New colostomy bag in place  Musculoskeletal:     Cervical back: Neck supple.  Skin:    General: Skin is warm.     Findings: Rash present.     Comments: Multiple bites arms and legs.   Neurological:     Mental Status: He is alert.     ED Results / Procedures / Treatments   Labs (all labs ordered are listed, but only abnormal results are displayed) Labs Reviewed  URINALYSIS, ROUTINE W REFLEX MICROSCOPIC - Abnormal; Notable for the following components:      Result Value   Color, Urine AMBER (*)    APPearance CLOUDY (*)    Hgb urine dipstick LARGE (*)  Protein, ur 100 (*)    Nitrite POSITIVE (*)    Leukocytes,Ua MODERATE (*)    RBC / HPF >50 (*)    WBC, UA >50 (*)    Bacteria, UA MANY (*)    All other components within normal limits    EKG None  Radiology No results found.  Procedures Procedures (including critical care time)  Medications Ordered in ED Medications - No data to display  ED Course  I have reviewed the triage vital signs and the nursing notes.  Pertinent labs & imaging results that were available during my care of the patient were reviewed by me and considered in my medical decision making (see chart for details).    MDM Rules/Calculators/A&P                          Pt advised to use hydrocortisone on bites.  Pt given Rocephin IM.  rx for Bactrim.  Social work will provide with supplies  Final Clinical Impression(s) / ED Diagnoses Final diagnoses:  Bug bite,  initial encounter  Urinary tract infection without hematuria, site unspecified  Colostomy in place [facility])    Rx / DC Orders ED Discharge Orders         Ordered    cefTRIAXone (ROCEPHIN) 1 g injection   Once        07/20/20 2002    sulfamethoxazole-trimethoprim (BACTRIM DS) 800-160 MG tablet  2 times daily        07/20/20 2003        An After Visit Summary was printed and given to the patient.    Sidney Ace 07/20/20 2003    Tegeler, Gwenyth Allegra, MD 07/20/20 2114

## 2020-07-20 NOTE — ED Notes (Signed)
Pt provided with 5 new ostomy bags and supplies. Reviewed discharge instructions. Ambulatory to waiting room.

## 2020-07-20 NOTE — Progress Notes (Signed)
°   07/20/20 2015  TOC ED Mini Assessment  TOC Time spent with patient (minutes): 30  PING Used in TOC Assessment No  Admission or Readmission Diverted Yes  Interventions which prevented an admission or readmission Other (must enter comment) (Colostomy bags)  What brought you to the Emergency Department?  Needs colostomy supplies  Barriers to Discharge Barriers Resolved  Barrier interventions Provided colostomy supplies  Means of departure Not know

## 2020-07-20 NOTE — ED Triage Notes (Signed)
Pt requesting a new colotomy bag, currently has trash bag taped to abd, pt also reports burning with urination and a rash all over his arms

## 2020-07-20 NOTE — Discharge Instructions (Addendum)
Return if any problems.

## 2020-07-20 NOTE — Progress Notes (Signed)
TOC Team provided Pt with a few colostomy bags from supply on hand.  CSW explained to Pt that supply was very limited and it is not something that is normally kept on hand for distribution.    CSW discussed with Pt the importance of reaching out to [facility] directly to order supplies, Pt acknowledged having information previously.  Pt requested food and drink before d/c.  CSW supplied sack lunch and soda to Pt.

## 2020-07-20 NOTE — ED Notes (Signed)
Pt given new colostomy bag and skin cleaned

## 2020-07-22 ENCOUNTER — Telehealth: Payer: Self-pay | Admitting: Surgery

## 2020-07-22 NOTE — Telephone Encounter (Signed)
ED CM  Contacted patient concerning assistance with colostomy management.  Patient states he needs assistance with ordering colostomy supplies, patient tells me he does not know the correct size or how to correctly management the stoma [facility].  CM discussed [facility], patient provided with number to call Monday [phone] to set up appt. For colostomy management

## 2020-08-16 ENCOUNTER — Other Ambulatory Visit: Payer: Self-pay

## 2020-08-16 ENCOUNTER — Emergency Department (HOSPITAL_COMMUNITY): Payer: Medicaid Other

## 2020-08-16 ENCOUNTER — Inpatient Hospital Stay (HOSPITAL_COMMUNITY)
Admission: EM | Admit: 2020-08-16 | Discharge: 2020-08-28 | DRG: 394 | Disposition: A | Discharge status: Home / Self Care | Payer: Medicaid Other | Attending: Internal Medicine | Admitting: Internal Medicine

## 2020-08-16 DIAGNOSIS — K2970 Gastritis, unspecified, without bleeding: Secondary | ICD-10-CM | POA: Diagnosis present

## 2020-08-16 DIAGNOSIS — R7303 Prediabetes: Secondary | ICD-10-CM | POA: Diagnosis present

## 2020-08-16 DIAGNOSIS — K9409 Other complications of colostomy: Principal | ICD-10-CM | POA: Diagnosis present

## 2020-08-16 DIAGNOSIS — I319 Disease of pericardium, unspecified: Secondary | ICD-10-CM | POA: Diagnosis present

## 2020-08-16 DIAGNOSIS — D649 Anemia, unspecified: Secondary | ICD-10-CM | POA: Diagnosis present

## 2020-08-16 DIAGNOSIS — E872 Acidosis: Secondary | ICD-10-CM | POA: Diagnosis not present

## 2020-08-16 DIAGNOSIS — Z681 Body mass index (BMI) 19 or less, adult: Secondary | ICD-10-CM | POA: Diagnosis not present

## 2020-08-16 DIAGNOSIS — J9811 Atelectasis: Secondary | ICD-10-CM | POA: Diagnosis present

## 2020-08-16 DIAGNOSIS — Z87442 Personal history of urinary calculi: Secondary | ICD-10-CM

## 2020-08-16 DIAGNOSIS — I208 Other forms of angina pectoris: Secondary | ICD-10-CM | POA: Diagnosis present

## 2020-08-16 DIAGNOSIS — N138 Other obstructive and reflux uropathy: Secondary | ICD-10-CM | POA: Diagnosis present

## 2020-08-16 DIAGNOSIS — Z20822 Contact with and (suspected) exposure to covid-19: Secondary | ICD-10-CM | POA: Diagnosis present

## 2020-08-16 DIAGNOSIS — N200 Calculus of kidney: Secondary | ICD-10-CM

## 2020-08-16 DIAGNOSIS — Z23 Encounter for immunization: Secondary | ICD-10-CM

## 2020-08-16 DIAGNOSIS — N401 Enlarged prostate with lower urinary tract symptoms: Secondary | ICD-10-CM | POA: Diagnosis present

## 2020-08-16 DIAGNOSIS — Y929 Unspecified place or not applicable: Secondary | ICD-10-CM

## 2020-08-16 DIAGNOSIS — T83511A Infection and inflammatory reaction due to indwelling urethral catheter, initial encounter: Secondary | ICD-10-CM | POA: Diagnosis present

## 2020-08-16 DIAGNOSIS — D72829 Elevated white blood cell count, unspecified: Secondary | ICD-10-CM

## 2020-08-16 DIAGNOSIS — K56609 Unspecified intestinal obstruction, unspecified as to partial versus complete obstruction: Secondary | ICD-10-CM | POA: Diagnosis present

## 2020-08-16 DIAGNOSIS — R338 Other retention of urine: Secondary | ICD-10-CM | POA: Diagnosis present

## 2020-08-16 DIAGNOSIS — I309 Acute pericarditis, unspecified: Secondary | ICD-10-CM | POA: Diagnosis not present

## 2020-08-16 DIAGNOSIS — B9620 Unspecified Escherichia coli [E. coli] as the cause of diseases classified elsewhere: Secondary | ICD-10-CM | POA: Diagnosis present

## 2020-08-16 DIAGNOSIS — F17290 Nicotine dependence, other tobacco product, uncomplicated: Secondary | ICD-10-CM | POA: Diagnosis present

## 2020-08-16 DIAGNOSIS — R079 Chest pain, unspecified: Secondary | ICD-10-CM | POA: Diagnosis not present

## 2020-08-16 DIAGNOSIS — N179 Acute kidney failure, unspecified: Secondary | ICD-10-CM | POA: Diagnosis present

## 2020-08-16 DIAGNOSIS — R636 Underweight: Secondary | ICD-10-CM | POA: Diagnosis present

## 2020-08-16 DIAGNOSIS — I700 Atherosclerosis of aorta: Secondary | ICD-10-CM | POA: Diagnosis present

## 2020-08-16 DIAGNOSIS — E871 Hypo-osmolality and hyponatremia: Secondary | ICD-10-CM | POA: Diagnosis not present

## 2020-08-16 DIAGNOSIS — R739 Hyperglycemia, unspecified: Secondary | ICD-10-CM | POA: Diagnosis not present

## 2020-08-16 DIAGNOSIS — R1110 Vomiting, unspecified: Secondary | ICD-10-CM

## 2020-08-16 DIAGNOSIS — F1290 Cannabis use, unspecified, uncomplicated: Secondary | ICD-10-CM | POA: Diagnosis present

## 2020-08-16 DIAGNOSIS — K567 Ileus, unspecified: Secondary | ICD-10-CM | POA: Diagnosis present

## 2020-08-16 DIAGNOSIS — Y738 Miscellaneous gastroenterology and urology devices associated with adverse incidents, not elsewhere classified: Secondary | ICD-10-CM | POA: Diagnosis present

## 2020-08-16 DIAGNOSIS — N390 Urinary tract infection, site not specified: Secondary | ICD-10-CM | POA: Diagnosis present

## 2020-08-16 DIAGNOSIS — F1490 Cocaine use, unspecified, uncomplicated: Secondary | ICD-10-CM | POA: Diagnosis present

## 2020-08-16 DIAGNOSIS — K435 Parastomal hernia without obstruction or  gangrene: Secondary | ICD-10-CM | POA: Diagnosis present

## 2020-08-16 DIAGNOSIS — R3989 Other symptoms and signs involving the genitourinary system: Secondary | ICD-10-CM | POA: Diagnosis not present

## 2020-08-16 LAB — CBC WITH DIFFERENTIAL/PLATELET
Abs Immature Granulocytes: 0.06 10*3/uL (ref 0.00–0.07)
Basophils Absolute: 0.1 10*3/uL (ref 0.0–0.1)
Basophils Relative: 1 %
Eosinophils Absolute: 0.5 10*3/uL (ref 0.0–0.5)
Eosinophils Relative: 3 %
HCT: 49.5 % (ref 39.0–52.0)
Hemoglobin: 15.6 g/dL (ref 13.0–17.0)
Immature Granulocytes: 0 %
Lymphocytes Relative: 3 %
Lymphs Abs: 0.6 10*3/uL — ABNORMAL LOW (ref 0.7–4.0)
MCH: 29.5 pg (ref 26.0–34.0)
MCHC: 31.5 g/dL (ref 30.0–36.0)
MCV: 93.8 fL (ref 80.0–100.0)
Monocytes Absolute: 0.9 10*3/uL (ref 0.1–1.0)
Monocytes Relative: 6 %
Neutro Abs: 14.6 10*3/uL — ABNORMAL HIGH (ref 1.7–7.7)
Neutrophils Relative %: 87 %
Platelets: 348 10*3/uL (ref 150–400)
RBC: 5.28 MIL/uL (ref 4.22–5.81)
RDW: 13.6 % (ref 11.5–15.5)
WBC: 16.7 10*3/uL — ABNORMAL HIGH (ref 4.0–10.5)
nRBC: 0 % (ref 0.0–0.2)

## 2020-08-16 LAB — COMPREHENSIVE METABOLIC PANEL
ALT: 15 U/L (ref 0–44)
AST: 19 U/L (ref 15–41)
Albumin: 3.8 g/dL (ref 3.5–5.0)
Alkaline Phosphatase: 93 U/L (ref 38–126)
Anion gap: 11 (ref 5–15)
BUN: 31 mg/dL — ABNORMAL HIGH (ref 8–23)
CO2: 29 mmol/L (ref 22–32)
Calcium: 14.5 mg/dL (ref 8.9–10.3)
Chloride: 104 mmol/L (ref 98–111)
Creatinine, Ser: 1.48 mg/dL — ABNORMAL HIGH (ref 0.61–1.24)
GFR, Estimated: 49 mL/min — ABNORMAL LOW (ref >60)
Glucose, Bld: 143 mg/dL — ABNORMAL HIGH (ref 70–99)
Potassium: 4 mmol/L (ref 3.5–5.1)
Sodium: 144 mmol/L (ref 135–145)
Total Bilirubin: 0.5 mg/dL (ref 0.3–1.2)
Total Protein: 8.7 g/dL — ABNORMAL HIGH (ref 6.5–8.1)

## 2020-08-16 LAB — RESPIRATORY PANEL BY RT PCR (FLU A&B, COVID)
Influenza A by PCR: NEGATIVE
Influenza B by PCR: NEGATIVE
SARS Coronavirus 2 by RT PCR: NEGATIVE

## 2020-08-16 LAB — LIPASE, BLOOD: Lipase: 43 U/L (ref 11–51)

## 2020-08-16 LAB — POC OCCULT BLOOD, ED: Fecal Occult Bld: NEGATIVE

## 2020-08-16 LAB — PROTIME-INR
INR: 1.1 (ref 0.8–1.2)
Prothrombin Time: 14.2 seconds (ref 11.4–15.2)

## 2020-08-16 LAB — APTT: aPTT: 36 seconds (ref 24–36)

## 2020-08-16 LAB — LACTIC ACID, PLASMA: Lactic Acid, Venous: 1.4 mmol/L (ref 0.5–1.9)

## 2020-08-16 LAB — TROPONIN I (HIGH SENSITIVITY)
Troponin I (High Sensitivity): 5 ng/L (ref <18)
Troponin I (High Sensitivity): 4 ng/L (ref <18)

## 2020-08-16 MED ORDER — SODIUM CHLORIDE 0.9 % IV SOLN: INTRAVENOUS | Status: AC

## 2020-08-16 MED ORDER — PANTOPRAZOLE SODIUM 40 MG IV SOLR
40.0000 mg | Freq: Once | INTRAVENOUS | Status: AC
  Administered 2020-08-16: 40 mg via INTRAVENOUS
  Filled 2020-08-16: qty 40

## 2020-08-16 MED ORDER — CALCITONIN (SALMON) 200 UNIT/ML IJ SOLN
4.0000 [IU]/kg | Freq: Two times a day (BID) | INTRAMUSCULAR | Status: DC
  Administered 2020-08-17 (×2): 272 [IU] via SUBCUTANEOUS
  Filled 2020-08-16 (×2): qty 1.36

## 2020-08-16 MED ORDER — ONDANSETRON HCL 4 MG/2ML IJ SOLN
4.0000 mg | Freq: Once | INTRAMUSCULAR | Status: AC
  Administered 2020-08-16: 4 mg via INTRAVENOUS
  Filled 2020-08-16: qty 2

## 2020-08-16 MED ORDER — IOHEXOL 300 MG/ML  SOLN
100.0000 mL | Freq: Once | INTRAMUSCULAR | Status: AC | PRN
  Administered 2020-08-16: 100 mL via INTRAVENOUS

## 2020-08-16 MED ORDER — LACTATED RINGERS IV BOLUS (SEPSIS)
1000.0000 mL | Freq: Once | INTRAVENOUS | Status: AC
  Administered 2020-08-16: 1000 mL via INTRAVENOUS

## 2020-08-16 MED ORDER — ZOLEDRONIC ACID 4 MG/5ML IV CONC
4.0000 mg | Freq: Once | INTRAVENOUS | Status: AC
  Administered 2020-08-17: 4 mg via INTRAVENOUS
  Filled 2020-08-16: qty 5

## 2020-08-16 NOTE — ED Triage Notes (Signed)
Patient BIB EMS with c/o UTI for over a month, has taken two different rounds of antibiotics for infection. Patient self caths and reports blood in his urine. Patient also complains of nausea/vomiting and heartburn since last night. Patient also complains of bilateral flank pain. Patient complains of pain/burning with urination, but denies retaining any urine when self cathing. Patient denies being around anyone else that has been sick but states he lives in a boarding house with several other men. Patient also has a colostomy bag present.Patient is AxOx4, in no apparent distress.

## 2020-08-16 NOTE — ED Notes (Signed)
Pt emesis x3 while staff was in room collecting vitals and triaging patient

## 2020-08-16 NOTE — ED Notes (Signed)
Date and time results received: 08/16/20 2100 (use smartphrase ".now" to insert current time)  Test: Calcium Critical Value: 14.5  Name of Provider Notified: Roslynn Amble, MD  Orders Received? Or Actions Taken?:

## 2020-08-16 NOTE — H&P (Signed)
History and Physical    Jamie Wilson DUK:[id number] DOB: [birth year] DOA: 08/16/2020  PCP: Patient, [name]  Patient coming from: Home via EMS  I have personally briefly reviewed patient's old medical records in [place]  Chief Complaint: Nausea, vomiting, abdominal pain  HPI: Jamie Wilson is a 64 y.o. 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  Patient states he has a history of bowel obstruction for which partial resection was performed in January 2018 with subsequent colostomy placement.  He says he was in prison at the time of the surgery.  He has not had routine surgical follow-up since then.  He has had several days of worsening abdominal pain with nausea and vomiting.  He has had decreased output from his colostomy.  He had worsening emesis today and came to the ED for further evaluation.  He says while in the waiting area he vomited 4 more times.  He says he has a history of kidney stones and has been having bilateral flank pain.  He says he has to self cath for urine output and does have some discomfort when he urinates.  He also reports significant heartburn sensation and chest discomfort recently.  He says on arrival to the ED he was having significant trouble breathing which has now resolved since NG tube was placed.  He says he currently lives in a boardinghouse.  He is not in contact with close family.  He reports smoking about 5 cigars per day. He reports occasional alcohol use about once a week.  He denies any history of alcohol withdrawal.  He reports occasional marijuana use and denies any cocaine or IV drug use.  He says he is not taking any medications regularly.  He is not on any calcium or vitamin supplements.  ED Course:  Initial vitals showed BP 142/84, pulse 125, RR 30, temp 97.7 Fahrenheit, SPO2 92% on room air.  Labs  significant for calcium 14.5, BUN 31, creatinine 1.48, sodium 144, potassium 4.0, bicarb 29, serum glucose 143, albumin 3.8, LFTs within normal limits, WBC 16.7, hemoglobin 15.6, platelets 348,000, lactic acid 1.4, lipase 43, high-sensitivity troponin I 5 > 4.  FOBT is negative.  Blood cultures were obtained and pending.  SARS-CoV-2 PCR is negative.  Influenza A/B PCR are negative.  Portable chest x-ray showed right base atelectasis.  CT abdomen/pelvis with contrast shows small bowel herniation through the left lower quadrant ostomy defect causing high-grade small bowel obstruction.  Right nephrolithiasis without hydronephrosis seen as well as layering high density material in the posterior urinary bladder.  NG tube was placed in follow-up imaging showed enteric tube tip in the left upper quadrant consistent with location in the upper stomach.  EDP discussed with on-call general surgery team will evaluate in the morning.  Medical admission was requested and the hospitalist service was consulted to admit for further evaluation and management.  Patient was given 1 L LR, 4 mg IV Zofran, and 40 mg IV Protonix while in the ED.  Review of Systems: All systems reviewed and are negative except as documented in history of present illness above.   Past Medical History:  Diagnosis Date  . Colostomy care ([place])   . Toe infection     Past Surgical History:  Procedure Laterality Date  . ABDOMINAL SURGERY      Social History:  reports that he has been smoking.  He has never used smokeless tobacco. He reports current alcohol use. He reports current drug use. Drug: Marijuana.  No Known Allergies  No family history on file.   Prior to Admission medications   Medication Sig Start Date End Date Taking? Authorizing Provider  cephALEXin (KEFLEX) 500 MG capsule Take 1 capsule (500 mg total) by mouth 4 (four) times daily. Patient not taking: Reported on 08/16/2020 06/27/20   Palumbo, April, MD   sulfamethoxazole-trimethoprim (BACTRIM DS) 800-160 MG tablet Take 1 tablet by mouth 2 (two) times daily. Patient not taking: Reported on 08/16/2020 07/20/20   Sidney Ace    Physical Exam: Vitals:   08/16/20 2006 08/16/20 2030 08/16/20 2130 08/16/20 2200  BP:  (!) 145/93 (!) 142/86 133/84  Pulse:  (!) 123 (!) 106 (!) 106  Resp:  18 19 14   Temp:      TempSrc:      SpO2:  92% 94% 93%  Weight: 68 kg     Height: 6\' 1"  (1.854 m)      Constitutional: Thin unkept man resting in bed with head slightly elevated.  NAD, calm, comfortable Eyes: PERRL, lids and conjunctivae normal ENMT: NG tube in place left nares with dark brown output, collecting bin nearly full.  Mucous membranes are dry. Posterior pharynx clear of any exudate or lesions. Neck: normal, supple, no masses. Respiratory: clear to auscultation bilaterally, no wheezing, no crackles. Normal respiratory effort. No accessory muscle use.  Cardiovascular: Tachycardic with regular rhythm, no murmurs / rubs / gallops. No extremity edema. 2+ pedal pulses. Abdomen: Slight distended abdomen with colostomy in place.  Brown stool present in colostomy bag.  Mild generalized tenderness on palpation.  Bowel sounds are diminished. Musculoskeletal: no clubbing / cyanosis. No joint deformity upper and lower extremities. Good ROM, no contractures. Normal muscle tone.  Skin: no rashes, lesions, ulcers. No induration Neurologic: CN 2-12 grossly intact. Sensation intact, Strength 5/5 in all 4.  Psychiatric: Normal judgment and insight. Alert and oriented x 3. Normal mood.   Labs on Admission: I have personally reviewed following labs and imaging studies  CBC: Recent Labs  Lab 08/16/20 1955  WBC 16.7*  NEUTROABS 14.6*  HGB 15.6  HCT 49.5  MCV 93.8  PLT 381   Basic Metabolic Panel: Recent Labs  Lab 08/16/20 1955  NA 144  K 4.0  CL 104  CO2 29  GLUCOSE 143*  BUN 31*  CREATININE 1.48*  CALCIUM 14.5*   GFR: Estimated  Creatinine Clearance: 48.5 mL/min (A) (by C-G formula based on SCr of 1.48 mg/dL (H)). Liver Function Tests: Recent Labs  Lab 08/16/20 1955  AST 19  ALT 15  ALKPHOS 93  BILITOT 0.5  PROT 8.7*  ALBUMIN 3.8   Recent Labs  Lab 08/16/20 1955  LIPASE 43   No results for input(s): AMMONIA in the last 168 hours. Coagulation Profile: Recent Labs  Lab 08/16/20 1955  INR 1.1   Cardiac Enzymes: No results for input(s): CKTOTAL, CKMB, CKMBINDEX, TROPONINI in the last 168 hours. BNP (last 3 results) No results for input(s): PROBNP in the last 8760 hours. HbA1C: No results for input(s): HGBA1C in the last 72 hours. CBG: No results for input(s): GLUCAP in the last 168 hours. Lipid Profile: No results for input(s): CHOL, HDL, LDLCALC, TRIG, CHOLHDL, LDLDIRECT in the last 72 hours. Thyroid Function Tests: No results for input(s): TSH, T4TOTAL, FREET4, T3FREE, THYROIDAB in the last 72 hours. Anemia Panel: No results for input(s): VITAMINB12, FOLATE, FERRITIN, TIBC, IRON,  RETICCTPCT in the last 72 hours. Urine analysis:    Component Value Date/Time   COLORURINE AMBER (A) 07/20/2020 1611   APPEARANCEUR CLOUDY (A) 07/20/2020 1611   LABSPEC 1.017 07/20/2020 1611   PHURINE 6.0 07/20/2020 1611   GLUCOSEU NEGATIVE 07/20/2020 1611   HGBUR LARGE (A) 07/20/2020 1611   BILIRUBINUR NEGATIVE 07/20/2020 1611   KETONESUR NEGATIVE 07/20/2020 1611   PROTEINUR 100 (A) 07/20/2020 1611   NITRITE POSITIVE (A) 07/20/2020 1611   LEUKOCYTESUR MODERATE (A) 07/20/2020 1611    Radiological Exams on Admission: DG Abdomen 1 View  Result Date: 08/16/2020 CLINICAL DATA:  NG tube placement EXAM: ABDOMEN - 1 VIEW COMPARISON:  CT 08/16/2020 FINDINGS: An enteric tube has been placed with tip in the left upper quadrant consistent with location in the upper stomach. Proximal side hole is positioned at or just below the level of the EG junction. Gas-filled dilated small bowel seen in the upper abdomen consistent  with small bowel obstruction. Lung bases are clear. IMPRESSION: Enteric tube tip is in the left upper quadrant consistent with location in the upper stomach. Electronically Signed   By: Lucienne Capers M.D.   On: 08/16/2020 22:15   CT ABDOMEN PELVIS W CONTRAST  Result Date: 08/16/2020 CLINICAL DATA:  Nausea, vomiting.  Bowel obstruction suspected EXAM: CT ABDOMEN AND PELVIS WITH CONTRAST TECHNIQUE: Multidetector CT imaging of the abdomen and pelvis was performed using the standard protocol following bolus administration of intravenous contrast. CONTRAST:  150mL OMNIPAQUE IOHEXOL 300 MG/ML  SOLN COMPARISON:  11/15/2017 FINDINGS: Lower chest: Linear density peripherally in the right lung base, likely atelectasis. No effusions. Hepatobiliary: Numerous cysts throughout the liver. Gallbladder unremarkable. Pancreas: No focal abnormality or ductal dilatation. Spleen: No focal abnormality.  Normal size. Adrenals/Urinary Tract: Multiple calcifications in the right kidney. No obstruction. Areas of cortical thinning and scarring in the right kidney. Left renal cyst in the midpole measures 4 cm. No hydronephrosis. Adrenal glands unremarkable. Bladder wall thickening measuring up to 12 mm on coronal imaging. Layering high-density material posteriorly in the bladder, likely numerous stones. Areas of calcification also noted linearly within the lumen of the bladder of unknown etiology. These could be along the thickened trabeculated bladder wall, but difficult to evaluate due to bladder decompression. Stomach/Bowel: Previously seen left lower quadrant ostomy has been taken down. There is herniation of small bowel loops through the ostomy defect. Dilated small bowel loops compatible with high-grade obstruction. Distal small bowel loops are decompressed. Stomach is dilated and fluid-filled. Large bowel decompressed. Vascular/Lymphatic: Aortic atherosclerosis. Reproductive: Mildly prominent prostate. Other: No free fluid or  free air. Musculoskeletal: No acute bony abnormality. IMPRESSION: Small bowel herniation through the left lower quadrant the ostomy defect causing high-grade small bowel obstruction. Right nephrolithiasis. No hydronephrosis. Layering high-density material posteriorly in the urinary bladder, likely small layering stones. Markedly thickened bladder wall abnormal calcifications seen within the lumen of the bladder, difficult to determine exact source and location. These could be along the thickened bladder wall. Aortic atherosclerosis. Hepatic and renal cysts. Electronically Signed   By: Rolm Baptise M.D.   On: 08/16/2020 21:33   DG Chest Port 1 View  Result Date: 08/16/2020 CLINICAL DATA:  Shortness of breath EXAM: PORTABLE CHEST 1 VIEW COMPARISON:  11/15/2017 FINDINGS: Right base atelectasis. Left lung clear. Heart is normal size. No effusions. No acute bony abnormality. IMPRESSION: Right base atelectasis. Electronically Signed   By: Rolm Baptise M.D.   On: 08/16/2020 20:34    EKG: Personally reviewed. Sinus tachycardia, rate  118, QTc 397, PR depression inferior leads.  No prior for comparison.  Assessment/Plan Principal Problem:   Small bowel obstruction (HCC) Active Problems:   Hypercalcemia   AKI (acute kidney injury) ([place])   Right nephrolithiasis  Jurgen Groeneveld is a 64 y.o. 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  Small bowel obstruction secondary to herniation through ostomy: -General surgery consulted and will evaluate in a.m. -NG tube in place, continue low intermittent suction -Monitor strict I/O's -Keep n.p.o. -Repeat KUB in a.m. -Antiemetics and analgesics as needed  Hypercalcemia: Corrected calcium 14.7 on admission.  Last calcium 9.4 11/21/2019. -Start IV fluid hydration with NS@ 150 mL/hour overnight -Start IV  calcitonin 4 units/kg sq twice daily for max 4 doses -Give IV zoledronic acid 4 mg once now -Strict urine output, bladder scan and in and out cath as needed -Repeat labs in a.m.  Acute kidney injury: Likely prerenal from SBO and GI losses.  Continue IV fluid hydration as above and repeat labs in a.m.  Right nephrolithiasis: Seen on CT imaging without hydronephrosis.  Small layering stones noted in the urinary bladder as well as abnormal calcifications within the lumen of the bladder, may be along the thickened bladder wall per radiology report. -Continue IV fluids and analgesics as above -Follow urinalysis  DVT prophylaxis: SCDs for now in case patient requires surgical intervention Code Status: Full code, confirmed with patient Family Communication: Discussed with patient, he is not in close contact with family.  He says an acquaintance at his boarding house is aware. Disposition Plan: From home/boardinghouse.  Dispo pending clinical progress and further surgical evaluation. Consults called: General surgery Admission status:  Status is: Inpatient  Remains inpatient appropriate because:Persistent severe electrolyte disturbances, IV treatments appropriate due to intensity of illness or inability to take PO and Inpatient level of care appropriate due to severity of illness   Dispo: The patient is from: Home              Anticipated d/c is to: Home              Anticipated d/c date is: 3 days              Patient currently is not medically stable to d/c.   Zada Finders MD [facility]  If 7PM-7AM, please contact night-coverage [email or web address]  08/16/2020, 11:30 PM

## 2020-08-16 NOTE — ED Provider Notes (Signed)
[facility] DEPT Provider Note   CSN: [id number] Arrival date & time: 08/16/20  1938     History Chief Complaint  Patient presents with  . Urinary Tract Infection    Quintin Hjort is a 64 y.o. male.  Presents to ER with concern for nausea, vomiting, abdominal pain.  Patient reports that symptoms have been ongoing for the last couple days, significant worsening over the last 24 hours.  Has had 3 or 4 episodes of vomiting today, most recently noted some small streaks of blood.  No large clots.  Nonbilious.  He also has noted decreased ostomy output over the last 24 to 48 hours.  No gas from ostomy.  He reports that in 2018 he had bowel obstruction requiring colostomy placement at [facility].  Does not follow regularly with his doctor, unsure who operated.  HPI     Past Medical History:  Diagnosis Date  . Colostomy care ([place])   . Toe infection     There are no problems to display for this patient.   Past Surgical History:  Procedure Laterality Date  . ABDOMINAL SURGERY         No family history on file.  Social History   Tobacco Use  . Smoking status: Current Some Day Smoker  . Smokeless tobacco: Never Used  Substance Use Topics  . Alcohol use: Yes  . Drug use: Yes    Types: Marijuana    Home Medications Prior to Admission medications   Medication Sig Start Date End Date Taking? Authorizing Provider  cephALEXin (KEFLEX) 500 MG capsule Take 1 capsule (500 mg total) by mouth 4 (four) times daily. Patient not taking: Reported on 08/16/2020 06/27/20   Palumbo, April, MD  sulfamethoxazole-trimethoprim (BACTRIM DS) 800-160 MG tablet Take 1 tablet by mouth 2 (two) times daily. Patient not taking: Reported on 08/16/2020 07/20/20   Fransico Meadow, PA-C    Allergies    Patient has no known allergies.  Review of Systems   Review of Systems  Constitutional: Negative for chills and fever.  HENT: Negative for ear pain and sore throat.   Eyes:  Negative for pain and visual disturbance.  Respiratory: Negative for cough and shortness of breath.   Cardiovascular: Negative for chest pain and palpitations.  Gastrointestinal: Positive for abdominal pain, nausea and vomiting.  Genitourinary: Negative for dysuria and hematuria.  Musculoskeletal: Negative for arthralgias and back pain.  Skin: Negative for color change and rash.  Neurological: Negative for seizures and syncope.  All other systems reviewed and are negative.   Physical Exam Updated Vital Signs BP 133/84   Pulse (!) 106   Temp 97.7 F (36.5 C) (Oral)   Resp 14   Ht 6\' 1"  (1.854 m)   Wt 68 kg   SpO2 93%   BMI 19.78 kg/m   Physical Exam Vitals and nursing note reviewed.  Constitutional:      Appearance: He is well-developed.     Comments: Chronically ill-appearing  HENT:     Head: Normocephalic and atraumatic.     Mouth/Throat:     Mouth: Mucous membranes are moist.  Eyes:     Conjunctiva/sclera: Conjunctivae normal.  Cardiovascular:     Rate and Rhythm: Regular rhythm. Tachycardia present.     Pulses: Normal pulses.     Heart sounds: No murmur heard.   Pulmonary:     Effort: Pulmonary effort is normal. No respiratory distress.     Breath sounds: Normal breath sounds.  Abdominal:  Palpations: Abdomen is soft.     Tenderness: There is no abdominal tenderness.     Comments: Ostomy site intact, stool brown  Generalized tenderness to palpation, not frankly distended, no rebound or guarding  Musculoskeletal:        General: No deformity or signs of injury.     Cervical back: Neck supple.  Skin:    General: Skin is warm and dry.     Capillary Refill: Capillary refill takes less than 2 seconds.  Neurological:     General: No focal deficit present.     Mental Status: He is alert and oriented to person, place, and time.  Psychiatric:        Mood and Affect: Mood normal.     ED Results / Procedures / Treatments   Labs (all labs ordered are listed,  but only abnormal results are displayed) Labs Reviewed  COMPREHENSIVE METABOLIC PANEL - Abnormal; Notable for the following components:      Result Value   Glucose, Bld 143 (*)    BUN 31 (*)    Creatinine, Ser 1.48 (*)    Calcium 14.5 (*)    Total Protein 8.7 (*)    GFR, Estimated 49 (*)    All other components within normal limits  CBC WITH DIFFERENTIAL/PLATELET - Abnormal; Notable for the following components:   WBC 16.7 (*)    Neutro Abs 14.6 (*)    Lymphs Abs 0.6 (*)    All other components within normal limits  RESPIRATORY PANEL BY RT PCR (FLU A&B, COVID)  URINE CULTURE  CULTURE, BLOOD (ROUTINE X 2)  CULTURE, BLOOD (ROUTINE X 2)  LACTIC ACID, PLASMA  PROTIME-INR  APTT  LIPASE, BLOOD  URINALYSIS, ROUTINE W REFLEX MICROSCOPIC  POC OCCULT BLOOD, ED  TROPONIN I (HIGH SENSITIVITY)  TROPONIN I (HIGH SENSITIVITY)    EKG EKG Interpretation  Date/Time:  Tuesday August 16 2020 20:18:40 EDT Ventricular Rate:  118 PR Interval:    QRS Duration: 85 QT Interval:  283 QTC Calculation: 397 R Axis:   82 Text Interpretation: Sinus tachycardia Biatrial enlargement Nonspecific ST abnormality no acute STEMI, reviewed with  Orland Mustard by Madalyn Rob [id number]) on 08/16/2020 9:59:43 PM   Radiology DG Abdomen 1 View  Result Date: 08/16/2020 CLINICAL DATA:  NG tube placement EXAM: ABDOMEN - 1 VIEW COMPARISON:  CT 08/16/2020 FINDINGS: An enteric tube has been placed with tip in the left upper quadrant consistent with location in the upper stomach. Proximal side hole is positioned at or just below the level of the EG junction. Gas-filled dilated small bowel seen in the upper abdomen consistent with small bowel obstruction. Lung bases are clear. IMPRESSION: Enteric tube tip is in the left upper quadrant consistent with location in the upper stomach. Electronically Signed   By: Lucienne Capers M.D.   On: 08/16/2020 22:15   CT ABDOMEN PELVIS W CONTRAST  Result Date:  08/16/2020 CLINICAL DATA:  Nausea, vomiting.  Bowel obstruction suspected EXAM: CT ABDOMEN AND PELVIS WITH CONTRAST TECHNIQUE: Multidetector CT imaging of the abdomen and pelvis was performed using the standard protocol following bolus administration of intravenous contrast. CONTRAST:  139mL OMNIPAQUE IOHEXOL 300 MG/ML  SOLN COMPARISON:  11/15/2017 FINDINGS: Lower chest: Linear density peripherally in the right lung base, likely atelectasis. No effusions. Hepatobiliary: Numerous cysts throughout the liver. Gallbladder unremarkable. Pancreas: No focal abnormality or ductal dilatation. Spleen: No focal abnormality.  Normal size. Adrenals/Urinary Tract: Multiple calcifications in the right kidney. No obstruction. Areas of cortical thinning  and scarring in the right kidney. Left renal cyst in the midpole measures 4 cm. No hydronephrosis. Adrenal glands unremarkable. Bladder wall thickening measuring up to 12 mm on coronal imaging. Layering high-density material posteriorly in the bladder, likely numerous stones. Areas of calcification also noted linearly within the lumen of the bladder of unknown etiology. These could be along the thickened trabeculated bladder wall, but difficult to evaluate due to bladder decompression. Stomach/Bowel: Previously seen left lower quadrant ostomy has been taken down. There is herniation of small bowel loops through the ostomy defect. Dilated small bowel loops compatible with high-grade obstruction. Distal small bowel loops are decompressed. Stomach is dilated and fluid-filled. Large bowel decompressed. Vascular/Lymphatic: Aortic atherosclerosis. Reproductive: Mildly prominent prostate. Other: No free fluid or free air. Musculoskeletal: No acute bony abnormality. IMPRESSION: Small bowel herniation through the left lower quadrant the ostomy defect causing high-grade small bowel obstruction. Right nephrolithiasis. No hydronephrosis. Layering high-density material posteriorly in the urinary  bladder, likely small layering stones. Markedly thickened bladder wall abnormal calcifications seen within the lumen of the bladder, difficult to determine exact source and location. These could be along the thickened bladder wall. Aortic atherosclerosis. Hepatic and renal cysts. Electronically Signed   By: Rolm Baptise M.D.   On: 08/16/2020 21:33   DG Chest Port 1 View  Result Date: 08/16/2020 CLINICAL DATA:  Shortness of breath EXAM: PORTABLE CHEST 1 VIEW COMPARISON:  11/15/2017 FINDINGS: Right base atelectasis. Left lung clear. Heart is normal size. No effusions. No acute bony abnormality. IMPRESSION: Right base atelectasis. Electronically Signed   By: Rolm Baptise M.D.   On: 08/16/2020 20:34    Procedures Procedures (including critical care time)  Medications Ordered in ED Medications  lactated ringers bolus 1,000 mL (0 mLs Intravenous Stopped 08/16/20 2213)  ondansetron (ZOFRAN) injection 4 mg (4 mg Intravenous Given 08/16/20 2040)  pantoprazole (PROTONIX) injection 40 mg (40 mg Intravenous Given 08/16/20 2040)  iohexol (OMNIPAQUE) 300 MG/ML solution 100 mL (100 mLs Intravenous Contrast Given 08/16/20 2110)    ED Course  I have reviewed the triage vital signs and the nursing notes.  Pertinent labs & imaging results that were available during my care of the patient were reviewed by me and considered in my medical decision making (see chart for details).  Clinical Course as of Aug 16 2249  Tue Aug 16, 2020  2033 Repeat still with some ST segment changes but also noted PR depression, not clear STEMI, will d/w STEMI doc  EKG 12-Lead [RD]  2038 D/w Gwenlyn Found  - no STEMI, if anything maybe pericarditis, would consider transfer to [facility], he will let fellow know about pt   [RD]  2119 Obvious small bowel obstruction per my review of CT, will await final read, discussed with general surgery, will place NG tube   [RD]    Clinical Course User Index [RD] Lucrezia Starch, MD   MDM  Rules/Calculators/A&P                         64 year old male with history of bowel obstruction, colostomy placement presents to ER with nausea vomiting, abdominal pain.  Labs noted for leukocytosis, hypercalcemia.  EKG noted some ST segment changes but PR depression, STEMI doc said could be pericarditis. His CT showed obvious SBO. Suspect the ekg findings incidental. Rosendo Gros recommended attempting to reduce at bedside. I was unsuccessful in reducing at bedside. He reports someone from his team will see in morning, admit to hosp for  now and continue NG.  Consult to [facility] for further management.    Final Clinical Impression(s) / ED Diagnoses Final diagnoses:  Small bowel obstruction (HCC)  Leukocytosis, unspecified type    Rx / DC Orders ED Discharge Orders    None       Lucrezia Starch, MD 08/16/20 2250

## 2020-08-17 ENCOUNTER — Inpatient Hospital Stay (HOSPITAL_COMMUNITY): Payer: Medicaid Other

## 2020-08-17 ENCOUNTER — Encounter (HOSPITAL_COMMUNITY): Payer: Self-pay | Admitting: Internal Medicine

## 2020-08-17 DIAGNOSIS — I309 Acute pericarditis, unspecified: Secondary | ICD-10-CM | POA: Diagnosis not present

## 2020-08-17 DIAGNOSIS — R079 Chest pain, unspecified: Secondary | ICD-10-CM

## 2020-08-17 DIAGNOSIS — N200 Calculus of kidney: Secondary | ICD-10-CM | POA: Diagnosis not present

## 2020-08-17 DIAGNOSIS — K56609 Unspecified intestinal obstruction, unspecified as to partial versus complete obstruction: Secondary | ICD-10-CM | POA: Diagnosis not present

## 2020-08-17 DIAGNOSIS — N179 Acute kidney failure, unspecified: Secondary | ICD-10-CM

## 2020-08-17 DIAGNOSIS — R739 Hyperglycemia, unspecified: Secondary | ICD-10-CM

## 2020-08-17 LAB — ECHOCARDIOGRAM COMPLETE
Area-P 1/2: 3.27 cm2
Height: 73 in
S' Lateral: 2.6 cm
Weight: 2398.6 oz

## 2020-08-17 LAB — COMPREHENSIVE METABOLIC PANEL
ALT: 12 U/L (ref 0–44)
AST: 14 U/L — ABNORMAL LOW (ref 15–41)
Albumin: 3.1 g/dL — ABNORMAL LOW (ref 3.5–5.0)
Alkaline Phosphatase: 75 U/L (ref 38–126)
Anion gap: 9 (ref 5–15)
BUN: 27 mg/dL — ABNORMAL HIGH (ref 8–23)
CO2: 27 mmol/L (ref 22–32)
Calcium: 11.9 mg/dL — ABNORMAL HIGH (ref 8.9–10.3)
Chloride: 104 mmol/L (ref 98–111)
Creatinine, Ser: 1.22 mg/dL (ref 0.61–1.24)
GFR, Estimated: >60 mL/min (ref >60)
Glucose, Bld: 129 mg/dL — ABNORMAL HIGH (ref 70–99)
Potassium: 3.9 mmol/L (ref 3.5–5.1)
Sodium: 140 mmol/L (ref 135–145)
Total Bilirubin: 0.6 mg/dL (ref 0.3–1.2)
Total Protein: 7.2 g/dL (ref 6.5–8.1)

## 2020-08-17 LAB — CBC
HCT: 43.9 % (ref 39.0–52.0)
Hemoglobin: 13.9 g/dL (ref 13.0–17.0)
MCH: 29.5 pg (ref 26.0–34.0)
MCHC: 31.7 g/dL (ref 30.0–36.0)
MCV: 93.2 fL (ref 80.0–100.0)
Platelets: 298 10*3/uL (ref 150–400)
RBC: 4.71 MIL/uL (ref 4.22–5.81)
RDW: 13.4 % (ref 11.5–15.5)
WBC: 12.5 10*3/uL — ABNORMAL HIGH (ref 4.0–10.5)
nRBC: 0 % (ref 0.0–0.2)

## 2020-08-17 LAB — URINALYSIS, ROUTINE W REFLEX MICROSCOPIC
Bilirubin Urine: NEGATIVE
Glucose, UA: NEGATIVE mg/dL
Ketones, ur: 5 mg/dL — AB
Nitrite: POSITIVE — AB
Protein, ur: 100 mg/dL — AB
RBC / HPF: >50 RBC/hpf — ABNORMAL HIGH (ref 0–5)
Specific Gravity, Urine: >1.046 — ABNORMAL HIGH (ref 1.005–1.030)
WBC, UA: >50 WBC/hpf — ABNORMAL HIGH (ref 0–5)
pH: 7 (ref 5.0–8.0)

## 2020-08-17 LAB — HIV ANTIBODY (ROUTINE TESTING W REFLEX): HIV Screen 4th Generation wRfx: NONREACTIVE

## 2020-08-17 MED ORDER — HYDROMORPHONE HCL 1 MG/ML IJ SOLN
0.5000 mg | INTRAMUSCULAR | Status: DC | PRN
  Administered 2020-08-17 – 2020-08-22 (×25): 0.5 mg via INTRAVENOUS
  Filled 2020-08-17 (×25): qty 0.5

## 2020-08-17 MED ORDER — ONDANSETRON HCL 4 MG PO TABS: 4.0000 mg | ORAL_TABLET | Freq: Four times a day (QID) | ORAL | Status: DC | PRN

## 2020-08-17 MED ORDER — SODIUM CHLORIDE 0.9 % IV SOLN
1.0000 g | INTRAVENOUS | Status: DC
  Administered 2020-08-17 – 2020-08-24 (×8): 1 g via INTRAVENOUS
  Filled 2020-08-17: qty 10
  Filled 2020-08-17 (×7): qty 1

## 2020-08-17 MED ORDER — ONDANSETRON HCL 4 MG/2ML IJ SOLN
4.0000 mg | Freq: Four times a day (QID) | INTRAMUSCULAR | Status: DC | PRN
  Administered 2020-08-17 – 2020-08-20 (×3): 4 mg via INTRAVENOUS
  Filled 2020-08-17 (×4): qty 2

## 2020-08-17 MED ORDER — CHLORHEXIDINE GLUCONATE CLOTH 2 % EX PADS
6.0000 | MEDICATED_PAD | Freq: Every day | CUTANEOUS | Status: DC
  Administered 2020-08-17 – 2020-08-28 (×11): 6 via TOPICAL

## 2020-08-17 MED ORDER — INFLUENZA VAC A&B SA ADJ QUAD 0.5 ML IM PRSY
0.5000 mL | PREFILLED_SYRINGE | INTRAMUSCULAR | Status: AC
  Administered 2020-08-18: 0.5 mL via INTRAMUSCULAR
  Filled 2020-08-17: qty 0.5

## 2020-08-17 NOTE — Consult Note (Signed)
Reason for Consult: bowel obstruction Referring Physician: Apryl Brymer  Jamie Wilson is an 64 y.o. male.  HPI: 64 yo male with 1 week of abdominal pain. He had noticed his ostomy had stopped draining a few days ago. He had vomiting and came into the ED. He has not had vomiting since NG tube was placed. He notes his abdomen is less distended. He continues to have some pain in the area.  Past Medical History:  Diagnosis Date  . 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 Toe infection     Past Surgical History:  Procedure Laterality Date  . ABDOMINAL SURGERY      History reviewed. No pertinent family history.  Social History:  reports that he has been smoking. He has never used smokeless tobacco. He reports current alcohol use. He reports current drug use. Drug: Marijuana.  Allergies: No Known Allergies  Medications: I have reviewed the patient's current medications.  Results for orders placed or performed during the hospital encounter of 08/16/20 (from the past 48 hour(s))  Lactic acid, plasma     Status: None   Collection Time: 08/16/20  7:55 PM  Result Value Ref Range   Lactic Acid, Venous 1.4 0.5 - 1.9 mmol/L    Comment: Performed at [facility], [place] [address], [place], [place] [address]  Comprehensive metabolic panel     Status: Abnormal   Collection Time: 08/16/20  7:55 PM  Result Value Ref Range   Sodium 144 135 - 145 mmol/L   Potassium 4.0 3.5 - 5.1 mmol/L   Chloride 104 98 - 111 mmol/L   CO2 29 22 - 32 mmol/L   Glucose, Bld 143 (H) 70 - 99 mg/dL    Comment: Glucose reference range applies only to samples taken after fasting for at least 8 hours.   BUN 31 (H) 8 - 23 mg/dL   Creatinine, Ser 1.48 (H) 0.61 - 1.24 mg/dL   Calcium 14.5 (HH) 8.9 - 10.3 mg/dL    Comment: CRITICAL RESULT CALLED TO, READ BACK BY AND VERIFIED WITH: rn j smith at 2059 08/16/20 cruickshank a     Total Protein 8.7 (H) 6.5 - 8.1 g/dL   Albumin 3.8 3.5 - 5.0 g/dL   AST 19 15 - 41 U/L   ALT  15 0 - 44 U/L   Alkaline Phosphatase 93 38 - 126 U/L   Total Bilirubin 0.5 0.3 - 1.2 mg/dL   GFR, Estimated 49 (L) >60 mL/min   Anion gap 11 5 - 15    Comment: Performed at [facility], [place] [address], [place], [place] [address]  CBC WITH DIFFERENTIAL     Status: Abnormal   Collection Time: 08/16/20  7:55 PM  Result Value Ref Range   WBC 16.7 (H) 4.0 - 10.5 K/uL   RBC 5.28 4.22 - 5.81 MIL/uL   Hemoglobin 15.6 13.0 - 17.0 g/dL   HCT 49.5 39 - 52 %   MCV 93.8 80.0 - 100.0 fL   MCH 29.5 26.0 - 34.0 pg   MCHC 31.5 30.0 - 36.0 g/dL   RDW 13.6 11.5 - 15.5 %   Platelets 348 150 - 400 K/uL   nRBC 0.0 0.0 - 0.2 %   Neutrophils Relative % 87 %   Neutro Abs 14.6 (H) 1.7 - 7.7 K/uL   Lymphocytes Relative 3 %   Lymphs Abs 0.6 (L) 0.7 - 4.0 K/uL   Monocytes Relative 6 %   Monocytes Absolute 0.9 0.1 - 1.0  K/uL   Eosinophils Relative 3 %   Eosinophils Absolute 0.5 0.0 - 0.5 K/uL   Basophils Relative 1 %   Basophils Absolute 0.1 0.0 - 0.1 K/uL   Immature Granulocytes 0 %   Abs Immature Granulocytes 0.06 0.00 - 0.07 K/uL    Comment: Performed at [facility], [place] [address], [place], [place] [address]  Protime-INR     Status: None   Collection Time: 08/16/20  7:55 PM  Result Value Ref Range   Prothrombin Time 14.2 11.4 - 15.2 seconds   INR 1.1 0.8 - 1.2    Comment: (NOTE) INR goal varies based on device and disease states. Performed at [facility], [place] [address] [place], [place] [address]   APTT     Status: None   Collection Time: 08/16/20  7:55 PM  Result Value Ref Range   aPTT 36 24 - 36 seconds    Comment: Performed at [facility], [place] [address], [place], [place] [address]  Troponin I (High Sensitivity)     Status: None   Collection Time: 08/16/20  7:55 PM  Result Value Ref Range   Troponin I (High Sensitivity) 5 <18 ng/L    Comment: (NOTE) Elevated high sensitivity troponin I (hsTnI) values and  significant  changes across serial measurements may suggest ACS but many other  chronic and acute conditions are known to elevate hsTnI results.  Refer to the Links section for chest pain algorithms and additional  guidance. Performed at [facility], [place] [address], [place], [place] [address]   Lipase, blood     Status: None   Collection Time: 08/16/20  7:55 PM  Result Value Ref Range   Lipase 43 11 - 51 U/L    Comment: Performed at [facility], [place] [address], [place], [place] [address]  Respiratory Panel by RT PCR (Flu A&B, Covid) - Nasopharyngeal Swab     Status: None   Collection Time: 08/16/20  8:15 PM   Specimen: Nasopharyngeal Swab  Result Value Ref Range   SARS Coronavirus 2 by RT PCR NEGATIVE NEGATIVE    Comment: (NOTE) SARS-CoV-2 target nucleic acids are NOT DETECTED.  The SARS-CoV-2 RNA is generally detectable in upper respiratoy specimens during the acute phase of infection. The lowest concentration of SARS-CoV-2 viral copies this assay can detect is 131 copies/mL. A negative result does not preclude SARS-Cov-2 infection and should not be used as the sole basis for treatment or other patient management decisions. A negative result may occur with  improper specimen collection/handling, submission of specimen other than nasopharyngeal swab, presence of viral mutation(s) within the areas targeted by this assay, and inadequate number of viral copies (<131 copies/mL). A negative result must be combined with clinical observations, patient history, and epidemiological information. The expected result is Negative.  Fact Sheet for Patients:  PinkCheek.be  Fact Sheet for Healthcare Providers:  GravelBags.it  This test is no t yet approved or cleared by the [place] FDA and  has been authorized for detection and/or diagnosis of SARS-CoV-2 by FDA under an Emergency Use  Authorization (EUA). This EUA will remain  in effect (meaning this test can be used) for the duration of the COVID-19 declaration under Section 564(b)(1) of the Act, 21 U.S.C. section 360bbb-3(b)(1), unless the authorization is terminated or revoked sooner.     Influenza A by PCR NEGATIVE NEGATIVE   Influenza B by PCR NEGATIVE NEGATIVE    Comment: (NOTE) The  Xpert Xpress SARS-CoV-2/FLU/RSV assay is intended as an aid in  the diagnosis of influenza from Nasopharyngeal swab specimens and  should not be used as a sole basis for treatment. Nasal washings and  aspirates are unacceptable for Xpert Xpress SARS-CoV-2/FLU/RSV  testing.  Fact Sheet for Patients: PinkCheek.be  Fact Sheet for Healthcare Providers: GravelBags.it  This test is not yet approved or cleared by the [place] FDA and  has been authorized for detection and/or diagnosis of SARS-CoV-2 by  FDA under an Emergency Use Authorization (EUA). This EUA will remain  in effect (meaning this test can be used) for the duration of the  Covid-19 declaration under Section 564(b)(1) of the Act, 21  U.S.C. section 360bbb-3(b)(1), unless the authorization is  terminated or revoked. Performed at [facility], [place] [address], [place], [place] [address]   POC occult blood, ED     Status: None   Collection Time: 08/16/20  8:29 PM  Result Value Ref Range   Fecal Occult Bld NEGATIVE NEGATIVE  Troponin I (High Sensitivity)     Status: None   Collection Time: 08/16/20 10:05 PM  Result Value Ref Range   Troponin I (High Sensitivity) 4 <18 ng/L    Comment: (NOTE) Elevated high sensitivity troponin I (hsTnI) values and significant  changes across serial measurements may suggest ACS but many other  chronic and acute conditions are known to elevate hsTnI results.  Refer to the "Links" section for chest pain algorithms and additional  guidance. Performed at  [facility], [place] [address], [place], [place] [address]   Urinalysis, Routine w reflex microscopic Urine, Catheterized     Status: Abnormal   Collection Time: 08/17/20  3:00 AM  Result Value Ref Range   Color, Urine YELLOW YELLOW   APPearance CLOUDY (A) CLEAR   Specific Gravity, Urine >1.046 (H) 1.005 - 1.030   pH 7.0 5.0 - 8.0   Glucose, UA NEGATIVE NEGATIVE mg/dL   Hgb urine dipstick MODERATE (A) NEGATIVE   Bilirubin Urine NEGATIVE NEGATIVE   Ketones, ur 5 (A) NEGATIVE mg/dL   Protein, ur 100 (A) NEGATIVE mg/dL   Nitrite POSITIVE (A) NEGATIVE   Leukocytes,Ua MODERATE (A) NEGATIVE   RBC / HPF >50 (H) 0 - 5 RBC/hpf   WBC, UA >50 (H) 0 - 5 WBC/hpf   Bacteria, UA FEW (A) NONE SEEN   WBC Clumps PRESENT     Comment: Performed at [facility], [place] [address], [place], [place] [address]  CBC     Status: Abnormal   Collection Time: 08/17/20  4:30 AM  Result Value Ref Range   WBC 12.5 (H) 4.0 - 10.5 K/uL   RBC 4.71 4.22 - 5.81 MIL/uL   Hemoglobin 13.9 13.0 - 17.0 g/dL   HCT 43.9 39 - 52 %   MCV 93.2 80.0 - 100.0 fL   MCH 29.5 26.0 - 34.0 pg   MCHC 31.7 30.0 - 36.0 g/dL   RDW 13.4 11.5 - 15.5 %   Platelets 298 150 - 400 K/uL   nRBC 0.0 0.0 - 0.2 %    Comment: Performed at [facility], [place] [address] [place], [place] [address]  Comprehensive metabolic panel     Status: Abnormal   Collection Time: 08/17/20  4:30 AM  Result Value Ref Range   Sodium 140 135 - 145 mmol/L   Potassium 3.9 3.5 - 5.1 mmol/L   Chloride 104 98 - 111 mmol/L   CO2 27  22 - 32 mmol/L   Glucose, Bld 129 (H) 70 - 99 mg/dL    Comment: Glucose reference range applies only to samples taken after fasting for at least 8 hours.   BUN 27 (H) 8 - 23 mg/dL   Creatinine, Ser 1.22 0.61 - 1.24 mg/dL   Calcium 11.9 (H) 8.9 - 10.3 mg/dL    Comment: DELTA CHECK NOTED   Total Protein 7.2 6.5 - 8.1 g/dL   Albumin 3.1 (L) 3.5 - 5.0 g/dL   AST 14 (L) 15 - 41 U/L    ALT 12 0 - 44 U/L   Alkaline Phosphatase 75 38 - 126 U/L   Total Bilirubin 0.6 0.3 - 1.2 mg/dL   GFR, Estimated >60 >60 mL/min   Anion gap 9 5 - 15    Comment: Performed at [facility], [place] [address] [place], [place] [address]    DG Abd 1 View  Result Date: 08/17/2020 CLINICAL DATA:  Small-bowel obstruction. EXAM: ABDOMEN - 1 VIEW COMPARISON:  08/16/2020.  CT 08/16/2020. FINDINGS: NG tube tip noted over the upper stomach in unchanged position. Persistent prominently dilated loops of small bowel are noted. Ostomy site over the left lower quadrant noted. Surgical clips over the pelvis noted. Contrast in the bladder. Degenerative change lumbar spine and both hips. IMPRESSION: NG tube tip noted over the upper stomach in unchanged position. Persistent prominently dilated loops of small bowel noted without interim change and consistent with small bowel obstruction. Electronically Signed   By: Marcello Moores  Register   On: 08/17/2020 05:40   DG Abdomen 1 View  Result Date: 08/16/2020 CLINICAL DATA:  NG tube placement EXAM: ABDOMEN - 1 VIEW COMPARISON:  CT 08/16/2020 FINDINGS: An enteric tube has been placed with tip in the left upper quadrant consistent with location in the upper stomach. Proximal side hole is positioned at or just below the level of the EG junction. Gas-filled dilated small bowel seen in the upper abdomen consistent with small bowel obstruction. Lung bases are clear. IMPRESSION: Enteric tube tip is in the left upper quadrant consistent with location in the upper stomach. Electronically Signed   By: Lucienne Capers M.D.   On: 08/16/2020 22:15   CT ABDOMEN PELVIS W CONTRAST  Result Date: 08/16/2020 CLINICAL DATA:  Nausea, vomiting.  Bowel obstruction suspected EXAM: CT ABDOMEN AND PELVIS WITH CONTRAST TECHNIQUE: Multidetector CT imaging of the abdomen and pelvis was performed using the standard protocol following bolus administration of intravenous contrast. CONTRAST:   133mL OMNIPAQUE IOHEXOL 300 MG/ML  SOLN COMPARISON:  11/15/2017 FINDINGS: Lower chest: Linear density peripherally in the right lung base, likely atelectasis. No effusions. Hepatobiliary: Numerous cysts throughout the liver. Gallbladder unremarkable. Pancreas: No focal abnormality or ductal dilatation. Spleen: No focal abnormality.  Normal size. Adrenals/Urinary Tract: Multiple calcifications in the right kidney. No obstruction. Areas of cortical thinning and scarring in the right kidney. Left renal cyst in the midpole measures 4 cm. No hydronephrosis. Adrenal glands unremarkable. Bladder wall thickening measuring up to 12 mm on coronal imaging. Layering high-density material posteriorly in the bladder, likely numerous stones. Areas of calcification also noted linearly within the lumen of the bladder of unknown etiology. These could be along the thickened trabeculated bladder wall, but difficult to evaluate due to bladder decompression. Stomach/Bowel: Previously seen left lower quadrant ostomy has been taken down. There is herniation of small bowel loops through the ostomy defect. Dilated small bowel loops compatible with high-grade obstruction. Distal small bowel loops are decompressed. Stomach  is dilated and fluid-filled. Large bowel decompressed. Vascular/Lymphatic: Aortic atherosclerosis. Reproductive: Mildly prominent prostate. Other: No free fluid or free air. Musculoskeletal: No acute bony abnormality. IMPRESSION: Small bowel herniation through the left lower quadrant the ostomy defect causing high-grade small bowel obstruction. Right nephrolithiasis. No hydronephrosis. Layering high-density material posteriorly in the urinary bladder, likely small layering stones. Markedly thickened bladder wall abnormal calcifications seen within the lumen of the bladder, difficult to determine exact source and location. These could be along the thickened bladder wall. Aortic atherosclerosis. Hepatic and renal cysts.  Electronically Signed   By: Rolm Baptise M.D.   On: 08/16/2020 21:33   DG Chest Port 1 View  Result Date: 08/16/2020 CLINICAL DATA:  Shortness of breath EXAM: PORTABLE CHEST 1 VIEW COMPARISON:  11/15/2017 FINDINGS: Right base atelectasis. Left lung clear. Heart is normal size. No effusions. No acute bony abnormality. IMPRESSION: Right base atelectasis. Electronically Signed   By: Rolm Baptise M.D.   On: 08/16/2020 20:34    Review of Systems  Constitutional: Negative for chills and fever.  HENT: Negative for hearing loss.   Eyes: Negative for blurred vision and double vision.  Respiratory: Negative for cough and hemoptysis.   Cardiovascular: Negative for chest pain and palpitations.  Gastrointestinal: Positive for abdominal pain and nausea. Negative for vomiting.  Genitourinary: Positive for dysuria. Negative for urgency.  Musculoskeletal: Negative for myalgias and neck pain.  Skin: Negative for itching and rash.  Neurological: Negative for dizziness, tingling and headaches.  Endo/Heme/Allergies: Does not bruise/bleed easily.  Psychiatric/Behavioral: Negative for depression and suicidal ideas.  All other systems reviewed and are negative.   PE Blood pressure 130/81, pulse (!) 113, temperature 98 F (36.7 C), temperature source Oral, resp. rate 18, height 6\' 1"  (1.854 m), weight 68 kg, SpO2 92 %. Constitutional: NAD; poor historian; no deformities Eyes: Moist conjunctiva; no lid lag; anicteric; PERRL Neck: Trachea midline; no thyromegaly Lungs: Normal respiratory effort; no tactile fremitus CV: RRR; no palpable thrills; no pitting edema GI: Abd soft, slight pain on palpation no guarding; ostomy soft, no incarcerated parastomal hernia, no palpable incisional hernia no palpable hepatosplenomegaly MSK: unable to assess gait; no clubbing/cyanosis Psychiatric: Appropriate affect; alert and oriented x3 Lymphatic: No palpable cervical or axillary lymphadenopathy   Assessment/Plan: 64 yo  male with bowel obstruction related to ostomy. On exam today, hernia appears reduced, no cardinal signs, feculent output. Unable to account for amount from NG in the emergency room but 500 ml in canister currently. -continue NG tube -will reassess later today -bowel rest  BPH/nephrolithiasis - patient notes self cath dependent with concern for UTI - may benefit from foley or urologic consult  Arta Bruce Rodd Heft 08/17/2020, 7:36 AM

## 2020-08-17 NOTE — Consult Note (Signed)
[place] Nurse ostomy consult note Stoma type/location: LLQ colostomy Stomal assessment/size: 1 " pink and moist Peristomal assessment: peristomal hernia present.  Will implement ostomy belt.  Treatment options for stomal/peristomal skin: barrier ring and 2 1/4" pouch.  Output none in pouch Patient indicates that he has supply issues at home and has new provider appointment 09/05/20 Ostomy pouching: 2pc. 2 1/4" pouch with belt and barrier ring.  Independent with care.  Education provided: informed patient on use of belt and rationale.  The large belt is too large for patient.   Enrolled patient in [facility] program: Yes previously Will not follow at this time.  Please re-consult if needed.  Domenic Moras MSN, RN, FNP-BC CWON Wound, Ostomy, Continence Nurse Pager [phone]

## 2020-08-17 NOTE — Consult Note (Addendum)
Cardiology Consultation:   Patient ID: Anthonee Gelin MRN: [id number]; DOB: September 16, 1956  Admit date: 08/16/2020 Date of Consult: 08/17/2020  Primary Care Provider: Patient, [name] [place] Cardiologist: New (Dr. Johney Frame) [place] Electrophysiologist:  None    Patient Profile:   Alexios Keown is a 64 y.o. male with a history of bowel obstruction s/p partial bowel resection with subsequent colostomy placement in 2018, nephrolithiasis, urinary retention (self caths) who is being seen today for the evaluation of abnormal EKG concerning for possible pericarditis at the request of [facility].  History of Present Illness:   Mr. Messman is a 64 year old male with the above history. No known cardiac history. He does not think he has every been seen by a Cardiologist. He does report an episode of severe heartburn several years ago while in prison. He states they did an EKG at that time but he does not think he had any ischemic work-up done. No known hypertension, hyperlipidemia, or diabetes. No known family history of CAD. He does have a long smoking history. He currently smokes 1/2 pack per day and has smoked since he was 16 years ago (quit for about 12 hours but restarted in 2012). He also notes occasional alcohol use and occasion drug use (marijuana and crack cocaine). He states he last used marijuana about 1 month ago and crack cocaine 1 week ago.  Patient has a history of bowel obstruction. He ultimately underwent partial bowel resection for this in 10/2016 with subsequent colostomy placement. He was in prison at the time of the surgery. Unfortunately, he has not had routine surgical follow-up since then.  Patient presented to the ED on 08/16/2020 for abdominal pain, nausea, and vomiting. H&P mentions that patient has had several days of worsening abdominal pain with nausea and vomiting. However, when talking with me patient states symptoms really started yesterday. He states he woke up early in  the morning with severe heart burn. He states he ate a hot pocket around 10-11pm on 08/15/2020 and then woke up in the middle of the night with severe heart burn. He tried taking Pepto Bismol with no improvement. Pain continued and then he developed nausea and vomiting around 4am yesterday. Symptoms persisted and he vomiting again yesterday afternoon. His roommates told him that he did not look good and he ultimately decided to come to the ED for further evaluation. He states he does not usually have heartburn. No other recent chest pain. No exertional chest pain. No recent viral illnesses. He reports feeling a little shortness of breath last night and this morning but nothing significant. He does describe sleeping on an incline and occasionally waking up feeling like he cannot breathe but he states this has been going on for years and attributes it to "sinus problems." Does not sound like true orthopnea or PND. No recent lower extremity edema. He notes occasional palpitations and some lightheadedness and dizziness normally when standing quickly that sounds like orthostasis. No syncope. No recent fevers or illness. No abnormal bleeding in urine or stools. He states his last BM was 3-4 days ago.  In the ED, patient tachycardic but vitals stable. EKG showed sinus tachycardia with mild ST elevation diffuse (mostly in inferior leads). Reviewed by STEMI MD and not felt to be acute STEMI - possible pericarditis. High-sensitivity troponin negative x2. Chest x-ray showed right base atelectasis. Abdominal/pelvic CT showed small bowel herniation through left lower quadrant ostomy defect causing high-grade small bowel obstruction as well as right nephrolithiasis but  no hydronephrosis. WBC 16.7, Hgb 15.6, Plts 13.6. Na 144, K 4.0, Glucose 143, BUN 31, Cr 1.48, Ca 14.5. LFTs normal. Blood cultures pending. Patient was treated with IV fluids, IV Zofran, and IV Protonix in the ED and admitted for further evaluation. NG tube was  placed. General surgery consulted and plan is for bowel rest for now and they will reassess later. Cardiology consulted for further evaluation of abnormal EKG.  At the time of this evaluation, patient resting comfortably in no acute distress. He states he is starting to feel a little nauseous again. He is currently chest pain free. He states heartburn resolved after arriving to the ED.   Past Medical History:  Diagnosis Date  . Colostomy care ([place])   . Toe infection     Past Surgical History:  Procedure Laterality Date  . ABDOMINAL SURGERY       Home Medications:  Prior to Admission medications   Medication Sig Start Date End Date Taking? Authorizing Provider  cephALEXin (KEFLEX) 500 MG capsule Take 1 capsule (500 mg total) by mouth 4 (four) times daily. Patient not taking: Reported on 08/16/2020 06/27/20   Palumbo, April, MD  sulfamethoxazole-trimethoprim (BACTRIM DS) 800-160 MG tablet Take 1 tablet by mouth 2 (two) times daily. Patient not taking: Reported on 08/16/2020 07/20/20   Fransico Meadow, PA-C    Inpatient Medications: Scheduled Meds: . calcitonin  4 Units/kg Subcutaneous BID  . [START ON 08/18/2020] influenza vaccine adjuvanted  0.5 mL Intramuscular Tomorrow-1000   Continuous Infusions: . sodium chloride 150 mL/hr at 08/17/20 0600  . cefTRIAXone (ROCEPHIN)  IV     PRN Meds: HYDROmorphone (DILAUDID) injection, ondansetron **OR** ondansetron (ZOFRAN) IV  Allergies:   No Known Allergies  Social History:   Social History   Socioeconomic History  . Marital status: Divorced    Spouse name: Not on file  . Number of children: Not on file  . Years of education: Not on file  . Highest education level: Not on file  Occupational History  . Not on file  Tobacco Use  . Smoking status: Current Some Day Smoker  . Smokeless tobacco: Never Used  Substance and Sexual Activity  . Alcohol use: Yes  . Drug use: Yes    Types: Marijuana  . Sexual activity: Not on file    Other Topics Concern  . Not on file  Social History Narrative  . Not on file   Social Determinants of Health   Financial Resource Strain:   . Difficulty of Paying Living Expenses: Not on file  Food Insecurity:   . Worried About Charity fundraiser in the Last Year: Not on file  . Ran Out of Food in the Last Year: Not on file  Transportation Needs:   . Lack of Transportation (Medical): Not on file  . Lack of Transportation (Non-Medical): Not on file  Physical Activity:   . Days of Exercise per Week: Not on file  . Minutes of Exercise per Session: Not on file  Stress:   . Feeling of Stress : Not on file  Social Connections:   . Frequency of Communication with Friends and Family: Not on file  . Frequency of Social Gatherings with Friends and Family: Not on file  . Attends Religious Services: Not on file  . Active Member of Clubs or Organizations: Not on file  . Attends Archivist Meetings: Not on file  . Marital Status: Not on file  Intimate Partner Violence:   . Fear  of Current or Ex-Partner: Not on file  . Emotionally Abused: Not on file  . Physically Abused: Not on file  . Sexually Abused: Not on file    Family History:    Family History  Problem Relation Age of Onset  . CAD Neg Hx   . Heart failure Neg Hx      ROS:  Please see the history of present illness.  All other ROS reviewed and negative.     Physical Exam/Data:   Vitals:   08/17/20 0147 08/17/20 0405 08/17/20 0604 08/17/20 0740  BP: (!) 153/83 136/79 130/81 119/66  Pulse: (!) 114 (!) 111 (!) 113 (!) 109  Resp: 18 16 18 18   Temp: 98.5 F (36.9 C) 98.2 F (36.8 C) 98 F (36.7 C) 98.5 F (36.9 C)  TempSrc: Oral Oral Oral Oral  SpO2: 93% 94% 92% 92%  Weight: 68 kg     Height: 6\' 1"  (1.854 m)       Intake/Output Summary (Last 24 hours) at 08/17/2020 0947 Last data filed at 08/17/2020 0600 Gross per 24 hour  Intake 934.35 ml  Output 1050 ml  Net -115.65 ml   Last 3 Weights  08/17/2020 08/16/2020 07/20/2020  Weight (lbs) 149 lb 14.6 oz 149 lb 14.6 oz 150 lb  Weight (kg) 68 kg 68 kg 68.04 kg     Body mass index is 19.78 kg/m.  General: 64 y.o. thin male resting comfortably in no acute distress. NG tube in place. HEENT: Normocephalic and atraumatic. Sclera clear.  Neck: Supple.No JVD. Heart: Tachycardic with regular rhythm.  Distinct S1 and S2. No murmurs, gallops, or rubs. Radial and distal pedal pulses 2+ and equal bilaterally. Lungs: No increased work of breathing. Clear to ausculation bilaterally. No wheezes, rhonchi, or rales.  Abdomen: Soft, non-distended, and non-tender to palpation. Bowel sounds present. Colostomy bag in place. MSK: Normal strength and tone for age. Extremities: No lower extremity edema.    Skin: Warm and dry. Neuro: Alert and oriented x3. No focal deficits. Psych: Normal affect. Responds appropriately.  EKG:  The EKG was personally reviewed and demonstrates:  - EKG 08/16/2020 at 20:16 shows sinus tachycardia, rate 115 bpm, with diffuse ST elevation. Mostly in inferior leads but slightly in V2-V3 and V6 as well.. - EKG 08/16/2020 at 21:59 shows sinus tachyrcardia, rate 118 bpm, with more diffuse ST elevation. Still most prominent in inferior leads but now in V4-V5 as well. PR depression in inferior leads and V6.  Telemetry:  Telemetry was personally reviewed and demonstrates:  Sinus tachycardia with rates in the 110's.  Relevant CV Studies: None.  Laboratory Data:  High Sensitivity Troponin:   Recent Labs  Lab 08/16/20 1955 08/16/20 2205  TROPONINIHS 5 4     Chemistry Recent Labs  Lab 08/16/20 1955 08/17/20 0430  NA 144 140  K 4.0 3.9  CL 104 104  CO2 29 27  GLUCOSE 143* 129*  BUN 31* 27*  CREATININE 1.48* 1.22  CALCIUM 14.5* 11.9*  GFRNONAA 49* >60  ANIONGAP 11 9    Recent Labs  Lab 08/16/20 1955 08/17/20 0430  PROT 8.7* 7.2  ALBUMIN 3.8 3.1*  AST 19 14*  ALT 15 12  ALKPHOS 93 75  BILITOT 0.5 0.6    Hematology Recent Labs  Lab 08/16/20 1955 08/17/20 0430  WBC 16.7* 12.5*  RBC 5.28 4.71  HGB 15.6 13.9  HCT 49.5 43.9  MCV 93.8 93.2  MCH 29.5 29.5  MCHC 31.5 31.7  RDW 13.6 13.4  PLT 348 298   BNPNo results for input(s): BNP, PROBNP in the last 168 hours.  DDimer No results for input(s): DDIMER in the last 168 hours.   Radiology/Studies:  DG Abd 1 View  Result Date: 08/17/2020 CLINICAL DATA:  Small-bowel obstruction. EXAM: ABDOMEN - 1 VIEW COMPARISON:  08/16/2020.  CT 08/16/2020. FINDINGS: NG tube tip noted over the upper stomach in unchanged position. Persistent prominently dilated loops of small bowel are noted. Ostomy site over the left lower quadrant noted. Surgical clips over the pelvis noted. Contrast in the bladder. Degenerative change lumbar spine and both hips. IMPRESSION: NG tube tip noted over the upper stomach in unchanged position. Persistent prominently dilated loops of small bowel noted without interim change and consistent with small bowel obstruction. Electronically Signed   By: Marcello Moores  Register   On: 08/17/2020 05:40   DG Abdomen 1 View  Result Date: 08/16/2020 CLINICAL DATA:  NG tube placement EXAM: ABDOMEN - 1 VIEW COMPARISON:  CT 08/16/2020 FINDINGS: An enteric tube has been placed with tip in the left upper quadrant consistent with location in the upper stomach. Proximal side hole is positioned at or just below the level of the EG junction. Gas-filled dilated small bowel seen in the upper abdomen consistent with small bowel obstruction. Lung bases are clear. IMPRESSION: Enteric tube tip is in the left upper quadrant consistent with location in the upper stomach. Electronically Signed   By: Lucienne Capers M.D.   On: 08/16/2020 22:15   CT ABDOMEN PELVIS W CONTRAST  Result Date: 08/16/2020 CLINICAL DATA:  Nausea, vomiting.  Bowel obstruction suspected EXAM: CT ABDOMEN AND PELVIS WITH CONTRAST TECHNIQUE: Multidetector CT imaging of the abdomen and pelvis was  performed using the standard protocol following bolus administration of intravenous contrast. CONTRAST:  129mL OMNIPAQUE IOHEXOL 300 MG/ML  SOLN COMPARISON:  11/15/2017 FINDINGS: Lower chest: Linear density peripherally in the right lung base, likely atelectasis. No effusions. Hepatobiliary: Numerous cysts throughout the liver. Gallbladder unremarkable. Pancreas: No focal abnormality or ductal dilatation. Spleen: No focal abnormality.  Normal size. Adrenals/Urinary Tract: Multiple calcifications in the right kidney. No obstruction. Areas of cortical thinning and scarring in the right kidney. Left renal cyst in the midpole measures 4 cm. No hydronephrosis. Adrenal glands unremarkable. Bladder wall thickening measuring up to 12 mm on coronal imaging. Layering high-density material posteriorly in the bladder, likely numerous stones. Areas of calcification also noted linearly within the lumen of the bladder of unknown etiology. These could be along the thickened trabeculated bladder wall, but difficult to evaluate due to bladder decompression. Stomach/Bowel: Previously seen left lower quadrant ostomy has been taken down. There is herniation of small bowel loops through the ostomy defect. Dilated small bowel loops compatible with high-grade obstruction. Distal small bowel loops are decompressed. Stomach is dilated and fluid-filled. Large bowel decompressed. Vascular/Lymphatic: Aortic atherosclerosis. Reproductive: Mildly prominent prostate. Other: No free fluid or free air. Musculoskeletal: No acute bony abnormality. IMPRESSION: Small bowel herniation through the left lower quadrant the ostomy defect causing high-grade small bowel obstruction. Right nephrolithiasis. No hydronephrosis. Layering high-density material posteriorly in the urinary bladder, likely small layering stones. Markedly thickened bladder wall abnormal calcifications seen within the lumen of the bladder, difficult to determine exact source and location.  These could be along the thickened bladder wall. Aortic atherosclerosis. Hepatic and renal cysts. Electronically Signed   By: Rolm Baptise M.D.   On: 08/16/2020 21:33   DG Chest Port 1 View  Result Date: 08/16/2020 CLINICAL DATA:  Shortness of  breath EXAM: PORTABLE CHEST 1 VIEW COMPARISON:  11/15/2017 FINDINGS: Right base atelectasis. Left lung clear. Heart is normal size. No effusions. No acute bony abnormality. IMPRESSION: Right base atelectasis. Electronically Signed   By: Rolm Baptise M.D.   On: 08/16/2020 20:34     Assessment and Plan:   Chest Discomfort Abnormal EKG - Patient presented with severe "heartburn" and abdominal pain, nausea, and vomiting and found to have a small bowel obstruction.  - EKG showed sinus tachycardia with diffuse ST elevation and PR depression. Reviewed by Dr. Gwenlyn Found (STEMI MD) and was not felt to be acute STEMI and possible pericarditis. - High-sensitivity troponin negative x2 at 5 >>4.  - Denies any pleuritic type pain. No recent viral illness. Heartburn has resolved.  - Will repeat EKG this morning.  - Can check Echo for signs of pericardial effusion. - Will check lipid panel and hemoglobin A1c. - Patient is NPO right now and is on bowel rest with SBO so no Colchicine or NSAIDs for now.  Sinus Tachycardia - Rates in the 110's.  - Likely physiologic response with SBO. - No need for AV nodal blockers at this time.  Otherwise, per primary team. - SBO - Hypercalcemia   - Right Nephrolithiasis - AKI - improved with fluids  HEAR Score (for undifferentiated chest pain):  HEAR Score: 4      For questions or updates, please contact [place] Please consult [email or web address] for contact info under    Signed, Darreld Mclean, PA-C  08/17/2020 9:47 AM  Patient seen and examined and agree with Sande Rives, PA as detailed above.  In brief, patient is a 65 year old male with history of bowel obstruction s/p partial bowel resection with  subsequent colostomy placement in 2018, urinary retention with self cath, and no known cardiac history who presented with nausea, vomiting and decreased ostomy draining. ECG on admission with diffuse STE concerning for pericarditis for which Cardiology has been consulted.  Patient states that he has very minimal chest pain when taking deep breaths. No known cardiac history. Did report some chest pain on exertion when mowing the lawn after 51minutes but no rest pain. Bedside TTE per prelim view showed no effusion. Patient is currently NPO.  Exam: General: Frail appearing male, appears older than stated age CV: tachycardic, regular, no significant murmurs Resp: CTAB Abd: Soft, ND. Colostomy bag in place Extremities: Thin, warm, no edema Neuro: Nonfocal  Plan: -No significant chest pain at this time and TTE with no effusion. Will hold off on colchicine/NSAIDs currently as patient NPO and no significant pain currently -Has some exertional symptoms concerning for stable angina. Will plan for out-patient stress test on a non-emergent basis -Will add ASA and statin once able to tolerate PO -Follow-up TTE -Management of bowel obstruction per primary team  Gwyndolyn Kaufman, MD

## 2020-08-17 NOTE — Progress Notes (Signed)
  Echocardiogram 2D Echocardiogram has been performed.  Jennette Dubin 08/17/2020, 4:07 PM

## 2020-08-17 NOTE — TOC Progression Note (Signed)
Transition of Care [facility]) - Progression Note    Patient Details  Name: Jamie Wilson MRN: [id number] Date of Birth: Mar 06, 1956  [facility] CM/SW Contact  Purcell Mouton, RN Phone Number: 08/17/2020, 11:56 AM  Clinical Narrative:     Spoke with pt who states that he is from a [place] and returning.  Expected Discharge Plan: Group Home Barriers to Discharge: No Barriers Identified  Expected Discharge Plan and Services Expected Discharge Plan: Group Home       Living arrangements for the past 2 months: Group Home                                       Social Determinants of Health (SDOH) Interventions    Readmission Risk Interventions No flowsheet data found.

## 2020-08-17 NOTE — Progress Notes (Signed)
   08/17/20 0147  Assess: MEWS Score  Temp 98.5 F (36.9 C)  BP (!) 153/83  Pulse Rate (!) 114  Resp 18  SpO2 93 %  O2 Device Room Air  Assess: MEWS Score  MEWS Temp 0  MEWS Systolic 0  MEWS Pulse 2  MEWS RR 0  MEWS LOC 0  MEWS Score 2  MEWS Score Color Yellow  Assess: if the MEWS score is Yellow or Red  Were vital signs taken at a resting state? Yes  Focused Assessment Change from prior assessment (see assessment flowsheet) (initial admission)  Early Detection of Sepsis Score *See Row Information* Medium  MEWS guidelines implemented *See Row Information* Yes  Treat  MEWS Interventions Administered prn meds/treatments  Take Vital Signs  Increase Vital Sign Frequency  Yellow: Q 2hr X 2 then Q 4hr X 2, if remains yellow, continue Q 4hrs  Escalate  MEWS: Escalate Yellow: discuss with charge nurse/RN and consider discussing with provider and RRT  Notify: Charge Nurse/RN  Name of Charge Nurse/RN Notified Michelle, RN  Date Charge Nurse/RN Notified 08/17/20  Time Charge Nurse/RN Notified 0210

## 2020-08-17 NOTE — Progress Notes (Signed)
PROGRESS NOTE    Jamie Wilson  ASN:[id number] DOB: 02-10-1956 DOA: 08/16/2020 PCP: Patient, [name]   Brief Narrative:  HPI per Dr. Zada Finders on 08/16/20 Jamie Wilson is a 64 y.o. 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  Patient states he has a history of bowel obstruction for which partial resection was performed in January 2018 with subsequent colostomy placement.  He says he was in prison at the time of the surgery.  He has not had routine surgical follow-up since then.  He has had several days of worsening abdominal pain with nausea and vomiting.  He has had decreased output from his colostomy.  He had worsening emesis today and came to the ED for further evaluation.  He says while in the waiting area he vomited 4 more times.  He says he has a history of kidney stones and has been having bilateral flank pain.  He says he has to self cath for urine output and does have some discomfort when he urinates.  He also reports significant heartburn sensation and chest discomfort recently.  He says on arrival to the ED he was having significant trouble breathing which has now resolved since NG tube was placed.  He says he currently lives in a boardinghouse.  He is not in contact with close family.  He reports smoking about 5 cigars per day. He reports occasional alcohol use about once a week.  He denies any history of alcohol withdrawal.  He reports occasional marijuana use and denies any cocaine or IV drug use.  He says he is not taking any medications regularly.  He is not on any calcium or vitamin supplements.  ED Course:  Initial vitals showed BP 142/84, pulse 125, RR 30, temp 97.7 Fahrenheit, SPO2 92% on room air.  Labs significant for calcium 14.5, BUN 31, creatinine 1.48, sodium 144, potassium 4.0, bicarb 29, serum glucose 143, albumin 3.8,  LFTs within normal limits, WBC 16.7, hemoglobin 15.6, platelets 348,000, lactic acid 1.4, lipase 43, high-sensitivity troponin I 5 > 4.  FOBT is negative.  Blood cultures were obtained and pending.  SARS-CoV-2 PCR is negative.  Influenza A/B PCR are negative.  Portable chest x-ray showed right base atelectasis.  CT abdomen/pelvis with contrast shows small bowel herniation through the left lower quadrant ostomy defect causing high-grade small bowel obstruction.  Right nephrolithiasis without hydronephrosis seen as well as layering high density material in the posterior urinary bladder.  NG tube was placed in follow-up imaging showed enteric tube tip in the left upper quadrant consistent with location in the upper stomach.  EDP discussed with on-call general surgery team will evaluate in the morning.  Medical admission was requested and the hospitalist service was consulted to admit for further evaluation and management.  Patient was given 1 L LR, 4 mg IV Zofran, and 40 mg IV Protonix while in the ED.  **Interim History Patient was admitted for small bowel obstruction and incidentally his EKG showed mildly diffuse ST elevation so cardiology was consulted and felt that patient possibly has pericarditis.  General surgery recommending continuing the NG tube in place and will continue n.p.o. and supportive care for now.  NG tube is connected to suction and patient is less distended today but still complaining of abdominal soreness.  Assessment & Plan:   Principal Problem:   Small bowel obstruction (HCC) Active Problems:  Hypercalcemia   AKI (acute kidney injury) ([place])   Right nephrolithiasis  Small bowel obstruction secondary to herniation through ostomy: -General surgery consulted and patient now has an NG tube and hernia appears to be reduced. -NG tube in place, continue low intermittent suction for now and general surgery to reassess the tube later today -CT Abdomen showed "Small bowel  herniation through the left lower quadrant the ostomy defect causing high-grade small bowel obstruction."  -Monitor strict I/O's -Keep n.p.o. and continue bowel rest -Repeat KUB in a.m. -WBC Went from 16.7 -> 12.5 -Antiemetics and analgesics as needed; continue with p.o./IV ondansetron 4 mg every 6 hours as needed nausea as well as 0.5 mg IV hydromorphone every 4 hours as needed severe pain -Continue with supportive care  Hypercalcemia -Corrected calcium 14.7 on admission.  Last calcium 9.4 11/21/2019. -Start IV fluid hydration with NS@ 150 mL/hour and will continue for 16 hours -Started IV calcitonin 4 units/kg sq twice daily for max 4 doses and now stopped  -Give IV zoledronic acid 4 mg once now -Strict urine output, bladder scan and in and out cath as needed -Repeat Labs this AM showed a Ca2+ of 11.9 and corrected for Albumin was 12.6 -C/w IVF Hydration  Acute Kidney Injury -Likely prerenal from SBO and GI losses.   -Patient's BUN/Cr went from 31/1.48 -> 27/1.22 -Is given 1 L lactated Ringer's bolus and started on IV fluid hydration with normal saline at 150 MLS per hour for 16 hours and will continue for now -Continue IV fluid hydration as above and repeat labs in a.m. -Avoid nephrotoxic medications, contrast dyes, hypotension and renally dose medications -Continue monitor and trend renal function and repeat CMP in a.m.  Right Nephrolithiasis: -Seen on CT imaging without hydronephrosis.   -Small layering stones noted in the urinary bladder as well as abnormal calcifications within the lumen of the bladder, may be along the thickened bladder wall per radiology report. -Continue IV fluids and analgesics as above -U/A as below  BPH -Will place Foley Catheter as patient Self Cath's -Discussed with Urology Dr. Diona Fanti and recommended Foley  Suspected UTI -In the setting of Self Cath and SBO -WBC on admission was 16.7 and trended down to 12.5 and could be in the setting of  dehydration SBL however urinalysis showed a cloudy appearance with moderate hemoglobin, 5 ketones, moderate leukocytes, positive nitrites, 100 protein, greater than 1.046 specific gravity, few bacteria, greater than 50 RBCs per high-power field, and greater than 50 WBCs -Urine culture still pending -Will empirically start antibiotics with IV Ceftriaxone 1 g every 24 hours -CTA showed "Right nephrolithiasis. No hydronephrosis. Layering high-density material posteriorly in the urinary bladder, likely small layering stones. Markedly thickened bladder wall abnormal calcifications seen within the lumen of the bladder, difficult to determine exact source and location. These could be along the thickened bladder wall. Aortic atherosclerosis. Hepatic and renal cysts."  Abnormal EKG -Patient had an abnormal EKG on Admission with mild ST Elevation which was diffuse -Cardiology consulted and reviewed EKG and felt it not to be a STEMI but felt it was possible Pericarditis -High-sensitivity troponins were done and negative x2 as initial 1 was 5 and repeat was 4 -Cardiology recommending repeating EKG in the morning and possibly an echo for signs of pericardial effusion -Currently patient remains n.p.o. and is on bowel rest today have not initiated colchicine or NSAIDs for now  Hyperglycemia -Patient's blood sugar on admission was 143 and repeat this morning was 129  -Check hemoglobin A1c  to rule out diabetes -Continue to monitor blood sugars carefully and if necessary will place on sensitive sliding scale insulin AC   DVT prophylaxis: SCDs; will add Heparin 5,000 units sq q8h Code Status: FULL CODE Family Communication: None Disposition Plan: (specify when and where you expect patient to be discharged). Include barriers to DC in this tab.  Status is: Inpatient  Remains inpatient appropriate because:Unsafe d/c plan, IV treatments appropriate due to intensity of illness or inability to take PO and Inpatient  level of care appropriate due to severity of illness   Dispo: The patient is from: Home              Anticipated d/c is to: Home              Anticipated d/c date is: 1-2 days              Patient currently is not medically stable to d/c.  Consultants:   General Surgery  Discussed Case with Urology   Procedures: NGT insertion   Antimicrobials:  Anti-infectives (From admission, onward)   Start     Dose/Rate Route Frequency Ordered Stop   08/17/20 0945  cefTRIAXone (ROCEPHIN) 1 g in sodium chloride 0.9 % 100 mL IVPB        1 g 200 mL/hr over 30 Minutes Intravenous Every 24 hours 08/17/20 0856         Subjective: Seen and examined at bedside and states that his abdomen is less distended than it was yesterday but still remains sore.  No nausea or vomiting currently and he has had a lot out from his NG tube.  Denies any chest pain, lightheadedness or dizziness.  No other concerns or complaints at this time.  Objective: Vitals:   08/17/20 0147 08/17/20 0405 08/17/20 0604 08/17/20 0740  BP: (!) 153/83 136/79 130/81 119/66  Pulse: (!) 114 (!) 111 (!) 113 (!) 109  Resp: 18 16 18 18   Temp: 98.5 F (36.9 C) 98.2 F (36.8 C) 98 F (36.7 C) 98.5 F (36.9 C)  TempSrc: Oral Oral Oral Oral  SpO2: 93% 94% 92% 92%  Weight: 68 kg     Height: 6\' 1"  (1.854 m)       Intake/Output Summary (Last 24 hours) at 08/17/2020 0857 Last data filed at 08/17/2020 0600 Gross per 24 hour  Intake 934.35 ml  Output 1050 ml  Net -115.65 ml   Filed Weights   08/16/20 2006 08/17/20 0147  Weight: 68 kg 68 kg   Examination: Physical Exam:  Constitutional: WN/WD slightly disheveled Caucasian male in NAD and appears calm but slightly uncomfortable Eyes: Lids and conjunctivae normal, sclerae anicteric  ENMT: External Ears, Nose appear normal. Grossly normal hearing. NGT in place Neck: Appears normal, supple, no cervical masses, normal ROM, no appreciable thyromegaly Respiratory: Diminished to  auscultation bilaterally with coarse breath sounds, no wheezing, rales, rhonchi or crackles. Normal respiratory effort and patient is not tachypenic. No accessory muscle use. Unlabored breathing   Cardiovascular: RRR, no murmurs / rubs / gallops. S1 and S2 auscultated. No extremity edema.  Abdomen: Soft, Tender to palpate, Distended due to body habitus. Has a colostomy in place with no output. Bowel sounds diminished.  GU: Deferred. Musculoskeletal: No clubbing / cyanosis of digits/nails. No joint deformity upper and lower extremities Skin: No rashes, lesions, ulcers on a limited skin evaluation. No induration; Warm and dry.  Neurologic: CN 2-12 grossly intact with no focal deficits. Romberg sign and cerebellar reflexes not assessed.  Psychiatric: Normal judgment and insight. Alert and oriented x 3. Normal mood and appropriate affect.   Data Reviewed: I have personally reviewed following labs and imaging studies  CBC: Recent Labs  Lab 08/16/20 1955 08/17/20 0430  WBC 16.7* 12.5*  NEUTROABS 14.6*  --   HGB 15.6 13.9  HCT 49.5 43.9  MCV 93.8 93.2  PLT 348 294   Basic Metabolic Panel: Recent Labs  Lab 08/16/20 1955 08/17/20 0430  NA 144 140  K 4.0 3.9  CL 104 104  CO2 29 27  GLUCOSE 143* 129*  BUN 31* 27*  CREATININE 1.48* 1.22  CALCIUM 14.5* 11.9*   GFR: Estimated Creatinine Clearance: 58.1 mL/min (by C-G formula based on SCr of 1.22 mg/dL). Liver Function Tests: Recent Labs  Lab 08/16/20 1955 08/17/20 0430  AST 19 14*  ALT 15 12  ALKPHOS 93 75  BILITOT 0.5 0.6  PROT 8.7* 7.2  ALBUMIN 3.8 3.1*   Recent Labs  Lab 08/16/20 1955  LIPASE 43   No results for input(s): AMMONIA in the last 168 hours. Coagulation Profile: Recent Labs  Lab 08/16/20 1955  INR 1.1   Cardiac Enzymes: No results for input(s): CKTOTAL, CKMB, CKMBINDEX, TROPONINI in the last 168 hours. BNP (last 3 results) No results for input(s): PROBNP in the last 8760 hours. HbA1C: No results for  input(s): HGBA1C in the last 72 hours. CBG: No results for input(s): GLUCAP in the last 168 hours. Lipid Profile: No results for input(s): CHOL, HDL, LDLCALC, TRIG, CHOLHDL, LDLDIRECT in the last 72 hours. Thyroid Function Tests: No results for input(s): TSH, T4TOTAL, FREET4, T3FREE, THYROIDAB in the last 72 hours. Anemia Panel: No results for input(s): VITAMINB12, FOLATE, FERRITIN, TIBC, IRON, RETICCTPCT in the last 72 hours. Sepsis Labs: Recent Labs  Lab 08/16/20 1955  LATICACIDVEN 1.4    Recent Results (from the past 240 hour(s))  Respiratory Panel by RT PCR (Flu A&B, Covid) - Nasopharyngeal Swab     Status: None   Collection Time: 08/16/20  8:15 PM   Specimen: Nasopharyngeal Swab  Result Value Ref Range Status   SARS Coronavirus 2 by RT PCR NEGATIVE NEGATIVE Final    Comment: (NOTE) SARS-CoV-2 target nucleic acids are NOT DETECTED.  The SARS-CoV-2 RNA is generally detectable in upper respiratoy specimens during the acute phase of infection. The lowest concentration of SARS-CoV-2 viral copies this assay can detect is 131 copies/mL. A negative result does not preclude SARS-Cov-2 infection and should not be used as the sole basis for treatment or other patient management decisions. A negative result may occur with  improper specimen collection/handling, submission of specimen other than nasopharyngeal swab, presence of viral mutation(s) within the areas targeted by this assay, and inadequate number of viral copies (<131 copies/mL). A negative result must be combined with clinical observations, patient history, and epidemiological information. The expected result is Negative.  Fact Sheet for Patients:  PinkCheek.be  Fact Sheet for Healthcare Providers:  GravelBags.it  This test is no t yet approved or cleared by the [place] FDA and  has been authorized for detection and/or diagnosis of SARS-CoV-2 by FDA under  an Emergency Use Authorization (EUA). This EUA will remain  in effect (meaning this test can be used) for the duration of the COVID-19 declaration under Section 564(b)(1) of the Act, 21 U.S.C. section 360bbb-3(b)(1), unless the authorization is terminated or revoked sooner.     Influenza A by PCR NEGATIVE NEGATIVE Final   Influenza B by PCR NEGATIVE NEGATIVE Final  Comment: (NOTE) The Xpert Xpress SARS-CoV-2/FLU/RSV assay is intended as an aid in  the diagnosis of influenza from Nasopharyngeal swab specimens and  should not be used as a sole basis for treatment. Nasal washings and  aspirates are unacceptable for Xpert Xpress SARS-CoV-2/FLU/RSV  testing.  Fact Sheet for Patients: PinkCheek.be  Fact Sheet for Healthcare Providers: GravelBags.it  This test is not yet approved or cleared by the [place] FDA and  has been authorized for detection and/or diagnosis of SARS-CoV-2 by  FDA under an Emergency Use Authorization (EUA). This EUA will remain  in effect (meaning this test can be used) for the duration of the  Covid-19 declaration under Section 564(b)(1) of the Act, 21  U.S.C. section 360bbb-3(b)(1), unless the authorization is  terminated or revoked. Performed at [facility], [place] [address], [place], [place] [address]     RN Pressure Injury Documentation:     Estimated body mass index is 19.78 kg/m as calculated from the following:   Height as of this encounter: 6\' 1"  (1.854 m).   Weight as of this encounter: 68 kg.  Malnutrition Type:      Malnutrition Characteristics:      Nutrition Interventions:    Radiology Studies: DG Abd 1 View  Result Date: 08/17/2020 CLINICAL DATA:  Small-bowel obstruction. EXAM: ABDOMEN - 1 VIEW COMPARISON:  08/16/2020.  CT 08/16/2020. FINDINGS: NG tube tip noted over the upper stomach in unchanged position. Persistent prominently dilated loops of  small bowel are noted. Ostomy site over the left lower quadrant noted. Surgical clips over the pelvis noted. Contrast in the bladder. Degenerative change lumbar spine and both hips. IMPRESSION: NG tube tip noted over the upper stomach in unchanged position. Persistent prominently dilated loops of small bowel noted without interim change and consistent with small bowel obstruction. Electronically Signed   By: Marcello Moores  Register   On: 08/17/2020 05:40   DG Abdomen 1 View  Result Date: 08/16/2020 CLINICAL DATA:  NG tube placement EXAM: ABDOMEN - 1 VIEW COMPARISON:  CT 08/16/2020 FINDINGS: An enteric tube has been placed with tip in the left upper quadrant consistent with location in the upper stomach. Proximal side hole is positioned at or just below the level of the EG junction. Gas-filled dilated small bowel seen in the upper abdomen consistent with small bowel obstruction. Lung bases are clear. IMPRESSION: Enteric tube tip is in the left upper quadrant consistent with location in the upper stomach. Electronically Signed   By: Lucienne Capers M.D.   On: 08/16/2020 22:15   CT ABDOMEN PELVIS W CONTRAST  Result Date: 08/16/2020 CLINICAL DATA:  Nausea, vomiting.  Bowel obstruction suspected EXAM: CT ABDOMEN AND PELVIS WITH CONTRAST TECHNIQUE: Multidetector CT imaging of the abdomen and pelvis was performed using the standard protocol following bolus administration of intravenous contrast. CONTRAST:  123mL OMNIPAQUE IOHEXOL 300 MG/ML  SOLN COMPARISON:  11/15/2017 FINDINGS: Lower chest: Linear density peripherally in the right lung base, likely atelectasis. No effusions. Hepatobiliary: Numerous cysts throughout the liver. Gallbladder unremarkable. Pancreas: No focal abnormality or ductal dilatation. Spleen: No focal abnormality.  Normal size. Adrenals/Urinary Tract: Multiple calcifications in the right kidney. No obstruction. Areas of cortical thinning and scarring in the right kidney. Left renal cyst in the  midpole measures 4 cm. No hydronephrosis. Adrenal glands unremarkable. Bladder wall thickening measuring up to 12 mm on coronal imaging. Layering high-density material posteriorly in the bladder, likely numerous stones. Areas of calcification also noted linearly within the lumen of the  bladder of unknown etiology. These could be along the thickened trabeculated bladder wall, but difficult to evaluate due to bladder decompression. Stomach/Bowel: Previously seen left lower quadrant ostomy has been taken down. There is herniation of small bowel loops through the ostomy defect. Dilated small bowel loops compatible with high-grade obstruction. Distal small bowel loops are decompressed. Stomach is dilated and fluid-filled. Large bowel decompressed. Vascular/Lymphatic: Aortic atherosclerosis. Reproductive: Mildly prominent prostate. Other: No free fluid or free air. Musculoskeletal: No acute bony abnormality. IMPRESSION: Small bowel herniation through the left lower quadrant the ostomy defect causing high-grade small bowel obstruction. Right nephrolithiasis. No hydronephrosis. Layering high-density material posteriorly in the urinary bladder, likely small layering stones. Markedly thickened bladder wall abnormal calcifications seen within the lumen of the bladder, difficult to determine exact source and location. These could be along the thickened bladder wall. Aortic atherosclerosis. Hepatic and renal cysts. Electronically Signed   By: Rolm Baptise M.D.   On: 08/16/2020 21:33   DG Chest Port 1 View  Result Date: 08/16/2020 CLINICAL DATA:  Shortness of breath EXAM: PORTABLE CHEST 1 VIEW COMPARISON:  11/15/2017 FINDINGS: Right base atelectasis. Left lung clear. Heart is normal size. No effusions. No acute bony abnormality. IMPRESSION: Right base atelectasis. Electronically Signed   By: Rolm Baptise M.D.   On: 08/16/2020 20:34   Scheduled Meds: . calcitonin  4 Units/kg Subcutaneous BID  . [START ON 08/18/2020]  influenza vaccine adjuvanted  0.5 mL Intramuscular Tomorrow-1000   Continuous Infusions: . sodium chloride 150 mL/hr at 08/17/20 0600  . cefTRIAXone (ROCEPHIN)  IV      LOS: 1 day   Kerney Elbe, DO [facility] PAGER is on AMION  If 7PM-7AM, please contact night-coverage [email or web address]

## 2020-08-17 NOTE — Plan of Care (Signed)

## 2020-08-18 ENCOUNTER — Inpatient Hospital Stay (HOSPITAL_COMMUNITY): Payer: Medicaid Other

## 2020-08-18 DIAGNOSIS — N200 Calculus of kidney: Secondary | ICD-10-CM | POA: Diagnosis not present

## 2020-08-18 DIAGNOSIS — I309 Acute pericarditis, unspecified: Secondary | ICD-10-CM | POA: Diagnosis not present

## 2020-08-18 DIAGNOSIS — K56609 Unspecified intestinal obstruction, unspecified as to partial versus complete obstruction: Secondary | ICD-10-CM | POA: Diagnosis not present

## 2020-08-18 DIAGNOSIS — N179 Acute kidney failure, unspecified: Secondary | ICD-10-CM | POA: Diagnosis not present

## 2020-08-18 DIAGNOSIS — D649 Anemia, unspecified: Secondary | ICD-10-CM

## 2020-08-18 DIAGNOSIS — R3989 Other symptoms and signs involving the genitourinary system: Secondary | ICD-10-CM

## 2020-08-18 LAB — CBC WITH DIFFERENTIAL/PLATELET
Abs Immature Granulocytes: 0.03 10*3/uL (ref 0.00–0.07)
Basophils Absolute: 0 10*3/uL (ref 0.0–0.1)
Basophils Relative: 0 %
Eosinophils Absolute: 1 10*3/uL — ABNORMAL HIGH (ref 0.0–0.5)
Eosinophils Relative: 12 %
HCT: 39.6 % (ref 39.0–52.0)
Hemoglobin: 12.2 g/dL — ABNORMAL LOW (ref 13.0–17.0)
Immature Granulocytes: 0 %
Lymphocytes Relative: 7 %
Lymphs Abs: 0.6 10*3/uL — ABNORMAL LOW (ref 0.7–4.0)
MCH: 29.4 pg (ref 26.0–34.0)
MCHC: 30.8 g/dL (ref 30.0–36.0)
MCV: 95.4 fL (ref 80.0–100.0)
Monocytes Absolute: 0.6 10*3/uL (ref 0.1–1.0)
Monocytes Relative: 7 %
Neutro Abs: 6.3 10*3/uL (ref 1.7–7.7)
Neutrophils Relative %: 74 %
Platelets: 252 10*3/uL (ref 150–400)
RBC: 4.15 MIL/uL — ABNORMAL LOW (ref 4.22–5.81)
RDW: 13.6 % (ref 11.5–15.5)
WBC: 8.5 10*3/uL (ref 4.0–10.5)
nRBC: 0 % (ref 0.0–0.2)

## 2020-08-18 LAB — COMPREHENSIVE METABOLIC PANEL
ALT: 10 U/L (ref 0–44)
AST: 12 U/L — ABNORMAL LOW (ref 15–41)
Albumin: 2.7 g/dL — ABNORMAL LOW (ref 3.5–5.0)
Alkaline Phosphatase: 62 U/L (ref 38–126)
Anion gap: 10 (ref 5–15)
BUN: 24 mg/dL — ABNORMAL HIGH (ref 8–23)
CO2: 25 mmol/L (ref 22–32)
Calcium: 9.5 mg/dL (ref 8.9–10.3)
Chloride: 109 mmol/L (ref 98–111)
Creatinine, Ser: 1.11 mg/dL (ref 0.61–1.24)
GFR, Estimated: >60 mL/min (ref >60)
Glucose, Bld: 94 mg/dL (ref 70–99)
Potassium: 3.6 mmol/L (ref 3.5–5.1)
Sodium: 144 mmol/L (ref 135–145)
Total Bilirubin: 0.6 mg/dL (ref 0.3–1.2)
Total Protein: 6.4 g/dL — ABNORMAL LOW (ref 6.5–8.1)

## 2020-08-18 LAB — LIPID PANEL
Cholesterol: 105 mg/dL (ref 0–200)
HDL: 39 mg/dL — ABNORMAL LOW (ref >40)
LDL Cholesterol: 51 mg/dL (ref 0–99)
Total CHOL/HDL Ratio: 2.7 RATIO
Triglycerides: 74 mg/dL (ref <150)
VLDL: 15 mg/dL (ref 0–40)

## 2020-08-18 LAB — MAGNESIUM: Magnesium: 2 mg/dL (ref 1.7–2.4)

## 2020-08-18 LAB — PTH, INTACT AND CALCIUM
Calcium, Total (PTH): 11.8 mg/dL — ABNORMAL HIGH (ref 8.6–10.2)
PTH: 8 pg/mL — ABNORMAL LOW (ref 15–65)

## 2020-08-18 LAB — PHOSPHORUS: Phosphorus: 1.7 mg/dL — ABNORMAL LOW (ref 2.5–4.6)

## 2020-08-18 MED ORDER — POTASSIUM PHOSPHATES 15 MMOLE/5ML IV SOLN
30.0000 mmol | Freq: Once | INTRAVENOUS | Status: AC
  Administered 2020-08-18: 30 mmol via INTRAVENOUS
  Filled 2020-08-18: qty 10

## 2020-08-18 MED ORDER — POTASSIUM CHLORIDE 10 MEQ/100ML IV SOLN
10.0000 meq | INTRAVENOUS | Status: AC
  Administered 2020-08-18 (×3): 10 meq via INTRAVENOUS
  Filled 2020-08-18 (×3): qty 100

## 2020-08-18 MED ORDER — SODIUM CHLORIDE 0.9 % IV SOLN: INTRAVENOUS | Status: DC

## 2020-08-18 MED ORDER — BELLADONNA ALKALOIDS-OPIUM 16.2-60 MG RE SUPP: 1.0000 | Freq: Three times a day (TID) | RECTAL | Status: DC | PRN

## 2020-08-18 MED ORDER — HEPARIN SODIUM (PORCINE) 5000 UNIT/ML IJ SOLN
5000.0000 [IU] | Freq: Three times a day (TID) | INTRAMUSCULAR | Status: DC
  Administered 2020-08-18 – 2020-08-28 (×30): 5000 [IU] via SUBCUTANEOUS
  Filled 2020-08-18 (×29): qty 1

## 2020-08-18 MED ORDER — TAMSULOSIN HCL 0.4 MG PO CAPS
0.4000 mg | ORAL_CAPSULE | Freq: Every day | ORAL | Status: DC
  Administered 2020-08-20 – 2020-08-28 (×9): 0.4 mg via ORAL
  Filled 2020-08-18 (×9): qty 1

## 2020-08-18 NOTE — Progress Notes (Signed)
PROGRESS NOTE    Jamie Wilson  TKZ:[id number] DOB: 1956/05/06 DOA: 08/16/2020 PCP: Patient, [name]   Brief Narrative:  HPI per Dr. Zada Finders on 08/16/20 Jamie Wilson is a 64 y.o. 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  Patient states he has a history of bowel obstruction for which partial resection was performed in January 2018 with subsequent colostomy placement.  He says he was in prison at the time of the surgery.  He has not had routine surgical follow-up since then.  He has had several days of worsening abdominal pain with nausea and vomiting.  He has had decreased output from his colostomy.  He had worsening emesis today and came to the ED for further evaluation.  He says while in the waiting area he vomited 4 more times.  He says he has a history of kidney stones and has been having bilateral flank pain.  He says he has to self cath for urine output and does have some discomfort when he urinates.  He also reports significant heartburn sensation and chest discomfort recently.  He says on arrival to the ED he was having significant trouble breathing which has now resolved since NG tube was placed.  He says he currently lives in a boardinghouse.  He is not in contact with close family.  He reports smoking about 5 cigars per day. He reports occasional alcohol use about once a week.  He denies any history of alcohol withdrawal.  He reports occasional marijuana use and denies any cocaine or IV drug use.  He says he is not taking any medications regularly.  He is not on any calcium or vitamin supplements.  ED Course:  Initial vitals showed BP 142/84, pulse 125, RR 30, temp 97.7 Fahrenheit, SPO2 92% on room air.  Labs significant for calcium 14.5, BUN 31, creatinine 1.48, sodium 144, potassium 4.0, bicarb 29, serum glucose 143, albumin 3.8,  LFTs within normal limits, WBC 16.7, hemoglobin 15.6, platelets 348,000, lactic acid 1.4, lipase 43, high-sensitivity troponin I 5 > 4.  FOBT is negative.  Blood cultures were obtained and pending.  SARS-CoV-2 PCR is negative.  Influenza A/B PCR are negative.  Portable chest x-ray showed right base atelectasis.  CT abdomen/pelvis with contrast shows small bowel herniation through the left lower quadrant ostomy defect causing high-grade small bowel obstruction.  Right nephrolithiasis without hydronephrosis seen as well as layering high density material in the posterior urinary bladder.  NG tube was placed in follow-up imaging showed enteric tube tip in the left upper quadrant consistent with location in the upper stomach.  EDP discussed with on-call general surgery team will evaluate in the morning.  Medical admission was requested and the hospitalist service was consulted to admit for further evaluation and management.  Patient was given 1 L LR, 4 mg IV Zofran, and 40 mg IV Protonix while in the ED.  **Interim History Patient was admitted for small bowel obstruction and incidentally his EKG showed mildly diffuse ST elevation so cardiology was consulted and felt that patient possibly has pericarditis.  General surgery recommending continuing the NG tube in place and will continue n.p.o. and supportive care for now.  NG tube is connected to suction and patient is less distended today but still complaining of abdominal soreness.  08/18/20: KUB showing some improvement however patient did not have any output from his ostomy yet  still.  Complaining of burning and discomfort in her urine and feels like his bladder spasming cardiology consulted for his diffuse ST elevations and echo showed an EF of 60 to 65% with normal diastolic function.  Cardiology recommending outpatient ischemic evaluation with NST versus coronary CTA if his heart rate improves and they will add aspirin and statin when tolerating  p.o..  Assessment & Plan:   Principal Problem:   Small bowel obstruction (HCC) Active Problems:   Hypercalcemia   AKI (acute kidney injury) ([place])   Right nephrolithiasis  Small bowel obstruction secondary to herniation through ostomy: -General surgery consulted and patient now has an NG tube and hernia appears to be reduced. -NG tube in place, continue low intermittent suction for now and general surgery to reassess the tube later today -CT Abdomen showed "Small bowel herniation through the left lower quadrant the ostomy defect causing high-grade small bowel obstruction."  -Monitor strict I/O's -Keep n.p.o. and continue bowel rest; now has some ice chips -Repeat KUB in a.m. again; KUB this AM showed " -WBC Went from 16.7 -> 12.5 -> and normalized to 8.5 -Antiemetics and analgesics as needed; continue with p.o./IV ondansetron 4 mg every 6 hours as needed nausea as well as 0.5 mg IV hydromorphone every 4 hours as needed severe pain -Continue with supportive care mobilize the patient.  Ensure potassium is greater than 4 and magnesium is greater than 2; patient's magnesium was 2.0 but his potassium was 3.6 so this was replete with IV KCl 30 mEq plus the IV K-Phos 30 mmol as below -General surgery recommending conservative measures and feel that there is no role for surgical intervention at this time  Hypercalcemia -Corrected calcium 14.7 on admission.  Last calcium 9.4 11/21/2019. -Start IV fluid hydration with NS@ 150 mL/hour and will continue for 16 hours -Started IV calcitonin 4 units/kg sq twice daily for max 4 doses and now stopped  -Give IV zoledronic acid 4 mg once now -PTH intact was 8, and Calicum Total (PTH) was 11.8 -Strict urine output, bladder scan and in and out cath as needed -Repeat Labs yesterday AM showed a Ca2+ of 11.9 and corrected for Albumin was 12.6; Now Ca2+ is 9.5 and Corrected for Albumin is 10.5 -C/w IVF Hydration  Acute Kidney Injury, improving  -Likely  prerenal from SBO and GI losses.   -Patient's BUN/Cr went from 31/1.48 -> 27/1.22 -> 24/1.11 -He was given 1 L lactated Ringer's bolus and started on IV fluid hydration with normal saline at 150 MLS per hour for 16 hours and this expired so will renew IVF with NS at 75 mL/hr -Continue IV fluid hydration as above and repeat labs in a.m. -Avoid nephrotoxic medications, contrast dyes, hypotension and renally dose medications -Continue monitor and trend renal function and repeat CMP in a.m.  Right Nephrolithiasis: -Seen on CT imaging without hydronephrosis.   -Small layering stones noted in the urinary bladder as well as abnormal calcifications within the lumen of the bladder, may be along the thickened bladder wall per radiology report. -Continue IV fluids and analgesics as above -Will add Tamsulosin 0.4 mg Daily -Also will add B&O Suppositories  -U/A as below  BPH -Will place Foley Catheter as patient Self Cath's -Discussed with Urology Dr. Diona Fanti and recommended Foley -Will add Tamsulosin as above  Suspected UTI -In the setting of Self Cath and SBO -WBC on admission was 16.7 and trended down to 12.5 and could be in the setting of dehydration SBL however urinalysis showed a  cloudy appearance with moderate hemoglobin, 5 ketones, moderate leukocytes, positive nitrites, 100 protein, greater than 1.046 specific gravity, few bacteria, greater than 50 RBCs per high-power field, and greater than 50 WBCs -Urine culture still pending sensitivities but show >100,000 CFU of GNR x2; Susceptibilities to follow -Will empirically start antibiotics with IV Ceftriaxone 1 g every 24 hours and escalate/de-escalate as needed -CTA showed "Right nephrolithiasis. No hydronephrosis. Layering high-density material posteriorly in the urinary bladder, likely small layering stones. Markedly thickened bladder wall abnormal calcifications seen within the lumen of the bladder, difficult to determine exact source and  location. These could be along the thickened bladder wall. Aortic atherosclerosis. Hepatic and renal cysts."  Abnormal EKG -Patient had an abnormal EKG on Admission with mild ST Elevation which was diffuse -Cardiology consulted and reviewed EKG and felt it not to be a STEMI but felt it was possible Pericarditis -High-sensitivity troponins were done and negative x2 as initial 1 was 5 and repeat was 4 -Cardiology recommending repeating EKG in the morning and possibly an echo for signs of pericardial effusion -Currently patient remains n.p.o. and is on bowel rest today have not initiated colchicine or NSAIDs for now -ECHO done and normal -Cardiology checked lipid panel showed a total cholesterol/HDL ratio of 2.7, cholesterol level of 105, HDL 39, LDL 51, triglycerides of 74, VLDL 15 -Cardiology planning on NST as an outpatient or Coronary CTA in the outpatient and will add ASA and Statin when taking po   Hyperglycemia -Patient's blood sugar on admission was 143 and repeat this morning was 129  -Check hemoglobin A1c to rule out diabetes and still pending to be done -Continue to monitor blood sugars carefully and if necessary will place on sensitive sliding scale insulin AC  Hypophosphatemia -Patient's Phos Level this AM was 1.7 -Replete with IV K Phos 30 mmol -Continue to Monitor and Replete as Necessary -Repeat Phos Level in the AM   Normocytic Anemia -? Dilutional Drop -Patient's Hgb/Hct went from 15.6/49.5 (Likely hemoconcentrated on admission) and trended down to 13.9/43.9 -> 12.2/39.6 -Check Anemia Panel in the AM -Continue to Monitor for S/Sx of Bleeding; Currently no overt bleeding noted -Repeat CBC in the AM  Sinus Tachycardia -Likely physiological response in relation to his small bowel obstruction and likely UTI as well as flank pain from nephrolithiasis -Cardiology recommending to continue to monitor on telemetry for now    DVT prophylaxis: SCDs; will add Heparin 5,000  units sq q8h Code Status: FULL CODE Family Communication: None Disposition Plan: Pending further clinical improvement back to baseline and tolerance of p.o. diet and clearance by cardiology and general surgery  Status is: Inpatient  Remains inpatient appropriate because:Unsafe d/c plan, IV treatments appropriate due to intensity of illness or inability to take PO and Inpatient level of care appropriate due to severity of illness   Dispo: The patient is from: Home              Anticipated d/c is to: Home              Anticipated d/c date is: 1-2 days when he is able to tolerate p.o.              Patient currently is not medically stable to d/c.  Consultants:   General Surgery  Cardiology  Discussed Case with Urology   Procedures: NGT insertion   Antimicrobials:  Anti-infectives (From admission, onward)   Start     Dose/Rate Route Frequency Ordered Stop   08/17/20 0930  cefTRIAXone (ROCEPHIN) 1 g in sodium chloride 0.9 % 100 mL IVPB        1 g 200 mL/hr over 30 Minutes Intravenous Every 24 hours 08/17/20 0856         Subjective: Seen and examined at bedside and states that he does not feel much better feels about the same as yesterday.  Still complaining of some abdominal soreness and tenderness on the right side of his abdomen also complaining of some flank pain and feels like he is passing a kidney stone.  States that his urine and Foley catheter is burning and does have some discomfort still was Foley catheter is inserted.  Denies any nausea or vomiting.  No gas or air in his colostomy bag.  Denies any other concerns or complaints at this time.  Wanting to rest.   Objective: Vitals:   08/17/20 0740 08/17/20 1302 08/17/20 2221 08/18/20 0554  BP: 119/66 (!) 105/59 107/71 108/71  Pulse: (!) 109 97 (!) 107 (!) 104  Resp: 18 18 20 20   Temp: 98.5 F (36.9 C) 98.3 F (36.8 C) 99.3 F (37.4 C) 99.8 F (37.7 C)  TempSrc: Oral Oral Axillary Axillary  SpO2: 92% 91% 93% 94%    Weight:      Height:        Intake/Output Summary (Last 24 hours) at 08/18/2020 0816 Last data filed at 08/18/2020 1027 Gross per 24 hour  Intake 1721.26 ml  Output 2500 ml  Net -778.74 ml   Filed Weights   08/16/20 2006 08/17/20 0147  Weight: 68 kg 68 kg   Examination: Physical Exam:  Constitutional: WN/WD disheveled Caucasian male currently in no acute distress appears calm but still does appear uncomfortable. Eyes: Lids and conjunctivae normal, sclerae anicteric  ENMT: External Ears, Nose appear normal. Grossly normal hearing.  NG tube in place in the left nare Neck: Appears normal, supple, no cervical masses, normal ROM, no appreciable thyromegaly; no appreciable JVD Respiratory: Diminished to auscultation bilaterally with coarse breath sounds, no wheezing, rales, rhonchi or crackles. Normal respiratory effort and patient is not tachypenic. No accessory muscle use.  Cardiovascular: RRR, no murmurs / rubs / gallops. S1 and S2 auscultated. No extremity edema. 2+ pedal pulses. No carotid bruits.  Abdomen: Soft, tender to palpate, slightly distended secondary body habitus and from his abdominal small bowel obstruction.  Has a colostomy bag with no output.  Bowel sounds are diminished  GU: Foley catheter is in place Musculoskeletal: No clubbing / cyanosis of digits/nails. No joint deformity upper and lower extremities.  Skin: No rashes, lesions, ulcers on limited skin evaluation. No induration; Warm and dry.  Neurologic: CN 2-12 grossly intact with no focal deficits. Romberg sign and cerebellar reflexes not assessed.  Psychiatric: Normal judgment and insight. Alert and oriented x 3. Normal mood and appropriate affect.   Data Reviewed: I have personally reviewed following labs and imaging studies  CBC: Recent Labs  Lab 08/16/20 1955 08/17/20 0430 08/18/20 0357  WBC 16.7* 12.5* 8.5  NEUTROABS 14.6*  --  6.3  HGB 15.6 13.9 12.2*  HCT 49.5 43.9 39.6  MCV 93.8 93.2 95.4  PLT  348 298 253   Basic Metabolic Panel: Recent Labs  Lab 08/16/20 1955 08/17/20 0430 08/18/20 0357  NA 144 140 144  K 4.0 3.9 3.6  CL 104 104 109  CO2 29 27 25   GLUCOSE 143* 129* 94  BUN 31* 27* 24*  CREATININE 1.48* 1.22 1.11  CALCIUM 14.5* 11.9* 9.5  MG  --   --  2.0  PHOS  --   --  1.7*   GFR: Estimated Creatinine Clearance: 63.8 mL/min (by C-G formula based on SCr of 1.11 mg/dL). Liver Function Tests: Recent Labs  Lab 08/16/20 1955 08/17/20 0430 08/18/20 0357  AST 19 14* 12*  ALT 15 12 10   ALKPHOS 93 75 62  BILITOT 0.5 0.6 0.6  PROT 8.7* 7.2 6.4*  ALBUMIN 3.8 3.1* 2.7*   Recent Labs  Lab 08/16/20 1955  LIPASE 43   No results for input(s): AMMONIA in the last 168 hours. Coagulation Profile: Recent Labs  Lab 08/16/20 1955  INR 1.1   Cardiac Enzymes: No results for input(s): CKTOTAL, CKMB, CKMBINDEX, TROPONINI in the last 168 hours. BNP (last 3 results) No results for input(s): PROBNP in the last 8760 hours. HbA1C: No results for input(s): HGBA1C in the last 72 hours. CBG: No results for input(s): GLUCAP in the last 168 hours. Lipid Profile: No results for input(s): CHOL, HDL, LDLCALC, TRIG, CHOLHDL, LDLDIRECT in the last 72 hours. Thyroid Function Tests: No results for input(s): TSH, T4TOTAL, FREET4, T3FREE, THYROIDAB in the last 72 hours. Anemia Panel: No results for input(s): VITAMINB12, FOLATE, FERRITIN, TIBC, IRON, RETICCTPCT in the last 72 hours. Sepsis Labs: Recent Labs  Lab 08/16/20 1955  LATICACIDVEN 1.4    Recent Results (from the past 240 hour(s))  Blood Culture (routine x 2)     Status: None (Preliminary result)   Collection Time: 08/16/20  7:55 PM   Specimen: BLOOD  Result Value Ref Range Status   Specimen Description   Final    BLOOD RIGHT ANTECUBITAL Performed at [facility] [address], [place], [place] [address]    Special Requests   Final    BOTTLES DRAWN AEROBIC AND ANAEROBIC Blood Culture adequate  volume Performed at [facility] [address], [place], [place] [address]    Culture   Final    NO GROWTH < 12 HOURS Performed at [facility] [address] [place], [place] [address]    Report Status PENDING  Incomplete  Blood Culture (routine x 2)     Status: None (Preliminary result)   Collection Time: 08/16/20  8:00 PM   Specimen: BLOOD  Result Value Ref Range Status   Specimen Description   Final    BLOOD LEFT ANTECUBITAL Performed at [facility] [address], [place], [place] [address]    Special Requests   Final    BOTTLES DRAWN AEROBIC AND ANAEROBIC Blood Culture adequate volume Performed at [facility] [address], [place], [place] [address]    Culture   Final    NO GROWTH < 12 HOURS Performed at [facility] [address] [place], [place] [address]    Report Status PENDING  Incomplete  Respiratory Panel by RT PCR (Flu A&B, Covid) - Nasopharyngeal Swab     Status: None   Collection Time: 08/16/20  8:15 PM   Specimen: Nasopharyngeal Swab  Result Value Ref Range Status   SARS Coronavirus 2 by RT PCR NEGATIVE NEGATIVE Final    Comment: (NOTE) SARS-CoV-2 target nucleic acids are NOT DETECTED.  The SARS-CoV-2 RNA is generally detectable in upper respiratoy specimens during the acute phase of infection. The lowest concentration of SARS-CoV-2 viral copies this assay can detect is 131 copies/mL. A negative result does not preclude SARS-Cov-2 infection and should not be used as the sole basis for treatment or other patient management decisions. A  negative result may occur with  improper specimen collection/handling, submission of specimen other than nasopharyngeal swab, presence of viral mutation(s) within the areas targeted by this assay, and inadequate number of viral copies (<131 copies/mL). A negative result must be combined with clinical observations, patient history, and  epidemiological information. The expected result is Negative.  Fact Sheet for Patients:  PinkCheek.be  Fact Sheet for Healthcare Providers:  GravelBags.it  This test is no t yet approved or cleared by the [place] FDA and  has been authorized for detection and/or diagnosis of SARS-CoV-2 by FDA under an Emergency Use Authorization (EUA). This EUA will remain  in effect (meaning this test can be used) for the duration of the COVID-19 declaration under Section 564(b)(1) of the Act, 21 U.S.C. section 360bbb-3(b)(1), unless the authorization is terminated or revoked sooner.     Influenza A by PCR NEGATIVE NEGATIVE Final   Influenza B by PCR NEGATIVE NEGATIVE Final    Comment: (NOTE) The Xpert Xpress SARS-CoV-2/FLU/RSV assay is intended as an aid in  the diagnosis of influenza from Nasopharyngeal swab specimens and  should not be used as a sole basis for treatment. Nasal washings and  aspirates are unacceptable for Xpert Xpress SARS-CoV-2/FLU/RSV  testing.  Fact Sheet for Patients: PinkCheek.be  Fact Sheet for Healthcare Providers: GravelBags.it  This test is not yet approved or cleared by the [place] FDA and  has been authorized for detection and/or diagnosis of SARS-CoV-2 by  FDA under an Emergency Use Authorization (EUA). This EUA will remain  in effect (meaning this test can be used) for the duration of the  Covid-19 declaration under Section 564(b)(1) of the Act, 21  U.S.C. section 360bbb-3(b)(1), unless the authorization is  terminated or revoked. Performed at [facility], [place] [address], [place], [place] [address]   Urine culture     Status: Abnormal (Preliminary result)   Collection Time: 08/17/20  2:00 AM   Specimen: In/Out Cath Urine  Result Value Ref Range Status   Specimen Description   Final    IN/OUT CATH  URINE Performed at [facility] [address], [place], [place] [address]    Special Requests   Final    NONE Performed at [facility], [place] [address] [place], [place] [address]    Culture (A)  Final    >=100,000 COLONIES/mL GRAM NEGATIVE RODS SUSCEPTIBILITIES TO FOLLOW Performed at [facility], [place] [address], [place], [place] [address]    Report Status PENDING  Incomplete  Culture, Urine     Status: Abnormal (Preliminary result)   Collection Time: 08/17/20  9:50 AM   Specimen: Urine, Random  Result Value Ref Range Status   Specimen Description   Final    URINE, RANDOM Performed at [facility] [address], [place], [place] [address]    Special Requests   Final    NONE Performed at [facility], [place] [address], [place], [place] [address]    Culture (A)  Final    >=100,000 COLONIES/mL GRAM NEGATIVE RODS SUSCEPTIBILITIES TO FOLLOW Performed at [facility], [place] [address], [place], [place] [address]    Report Status PENDING  Incomplete    RN Pressure Injury Documentation:     Estimated body mass index is 19.78 kg/m as calculated from the following:   Height as of this encounter: 6\' 1"  (1.854 m).   Weight as of this encounter: 68 kg.  Malnutrition Type:  Malnutrition Characteristics:      Nutrition Interventions:    Radiology Studies: DG Abd 1 View  Result Date: 08/18/2020 CLINICAL DATA:  Small-bowel obstruction. EXAM: ABDOMEN - 1 VIEW COMPARISON:  08/17/2020. FINDINGS: NG tube noted in stable position with its tip over the stomach. Interim improvement of small-bowel distention. Air noted throughout the colon. No free air identified. Possibly noted over the left lower quadrant. Surgical clips noted over the pelvis. No acute bony abnormality. IMPRESSION: 1. NG tube noted in stable position with its tip over the stomach. 2. Interim improvement of small-bowel  distention. Air noted throughout the colon. No free air identified. Electronically Signed   By: Marcello Moores  Register   On: 08/18/2020 05:48   DG Abd 1 View  Result Date: 08/17/2020 CLINICAL DATA:  Small-bowel obstruction. EXAM: ABDOMEN - 1 VIEW COMPARISON:  08/16/2020.  CT 08/16/2020. FINDINGS: NG tube tip noted over the upper stomach in unchanged position. Persistent prominently dilated loops of small bowel are noted. Ostomy site over the left lower quadrant noted. Surgical clips over the pelvis noted. Contrast in the bladder. Degenerative change lumbar spine and both hips. IMPRESSION: NG tube tip noted over the upper stomach in unchanged position. Persistent prominently dilated loops of small bowel noted without interim change and consistent with small bowel obstruction. Electronically Signed   By: Marcello Moores  Register   On: 08/17/2020 05:40   DG Abdomen 1 View  Result Date: 08/16/2020 CLINICAL DATA:  NG tube placement EXAM: ABDOMEN - 1 VIEW COMPARISON:  CT 08/16/2020 FINDINGS: An enteric tube has been placed with tip in the left upper quadrant consistent with location in the upper stomach. Proximal side hole is positioned at or just below the level of the EG junction. Gas-filled dilated small bowel seen in the upper abdomen consistent with small bowel obstruction. Lung bases are clear. IMPRESSION: Enteric tube tip is in the left upper quadrant consistent with location in the upper stomach. Electronically Signed   By: Lucienne Capers M.D.   On: 08/16/2020 22:15   CT ABDOMEN PELVIS W CONTRAST  Result Date: 08/16/2020 CLINICAL DATA:  Nausea, vomiting.  Bowel obstruction suspected EXAM: CT ABDOMEN AND PELVIS WITH CONTRAST TECHNIQUE: Multidetector CT imaging of the abdomen and pelvis was performed using the standard protocol following bolus administration of intravenous contrast. CONTRAST:  115mL OMNIPAQUE IOHEXOL 300 MG/ML  SOLN COMPARISON:  11/15/2017 FINDINGS: Lower chest: Linear density peripherally in the  right lung base, likely atelectasis. No effusions. Hepatobiliary: Numerous cysts throughout the liver. Gallbladder unremarkable. Pancreas: No focal abnormality or ductal dilatation. Spleen: No focal abnormality.  Normal size. Adrenals/Urinary Tract: Multiple calcifications in the right kidney. No obstruction. Areas of cortical thinning and scarring in the right kidney. Left renal cyst in the midpole measures 4 cm. No hydronephrosis. Adrenal glands unremarkable. Bladder wall thickening measuring up to 12 mm on coronal imaging. Layering high-density material posteriorly in the bladder, likely numerous stones. Areas of calcification also noted linearly within the lumen of the bladder of unknown etiology. These could be along the thickened trabeculated bladder wall, but difficult to evaluate due to bladder decompression. Stomach/Bowel: Previously seen left lower quadrant ostomy has been taken down. There is herniation of small bowel loops through the ostomy defect. Dilated small bowel loops compatible with high-grade obstruction. Distal small bowel loops are decompressed. Stomach is dilated and fluid-filled. Large bowel decompressed. Vascular/Lymphatic: Aortic atherosclerosis. Reproductive: Mildly prominent prostate. Other: No free fluid or free air. Musculoskeletal: No acute bony abnormality. IMPRESSION: Small bowel herniation through the  left lower quadrant the ostomy defect causing high-grade small bowel obstruction. Right nephrolithiasis. No hydronephrosis. Layering high-density material posteriorly in the urinary bladder, likely small layering stones. Markedly thickened bladder wall abnormal calcifications seen within the lumen of the bladder, difficult to determine exact source and location. These could be along the thickened bladder wall. Aortic atherosclerosis. Hepatic and renal cysts. Electronically Signed   By: Rolm Baptise M.D.   On: 08/16/2020 21:33   DG Chest Port 1 View  Result Date:  08/16/2020 CLINICAL DATA:  Shortness of breath EXAM: PORTABLE CHEST 1 VIEW COMPARISON:  11/15/2017 FINDINGS: Right base atelectasis. Left lung clear. Heart is normal size. No effusions. No acute bony abnormality. IMPRESSION: Right base atelectasis. Electronically Signed   By: Rolm Baptise M.D.   On: 08/16/2020 20:34   ECHOCARDIOGRAM COMPLETE  Result Date: 08/17/2020    ECHOCARDIOGRAM REPORT   Patient Name:   OLUWADEMILADE Heeter Date of Exam: 08/17/2020 Medical Rec #:  [id number]  Height:       73.0 in Accession #:    [id number] Weight:       149.9 lb Date of Birth:  [birth year] BSA:          1.904 m Patient Age:    64 years   BP:           105/59 mmHg Patient Gender: M          HR:           100 bpm. Exam Location:  Inpatient Procedure: 2D Echo Indications:    Chest Pain R07.9  History:        Patient has no prior history of Echocardiogram examinations.                 Risk Factors:Current Smoker.  Sonographer:    Mikki Santee RDCS (AE) Referring Phys: [id number] [place]  1. Left ventricular ejection fraction, by estimation, is 60 to 65%. The left ventricle has normal function. The left ventricle has no regional wall motion abnormalities. Left ventricular diastolic parameters were normal.  2. Right ventricular systolic function is normal. The right ventricular size is normal. Tricuspid regurgitation signal is inadequate for assessing PA pressure.  3. The mitral valve is normal in structure. Trivial mitral valve regurgitation. No evidence of mitral stenosis.  4. The aortic valve is grossly normal. There is mild calcification of the aortic valve. Aortic valve regurgitation is not visualized. No aortic stenosis is present.  5. The inferior vena cava is dilated in size with <50% respiratory variability, suggesting right atrial pressure of 15 mmHg. Comparison(s): No prior Echocardiogram. Conclusion(s)/Recommendation(s): Normal biventricular function without evidence of hemodynamically significant  valvular heart disease. FINDINGS  Left Ventricle: Left ventricular ejection fraction, by estimation, is 60 to 65%. The left ventricle has normal function. The left ventricle has no regional wall motion abnormalities. The left ventricular internal cavity size was normal in size. There is  no left ventricular hypertrophy. Left ventricular diastolic parameters were normal. Right Ventricle: The right ventricular size is normal. No increase in right ventricular wall thickness. Right ventricular systolic function is normal. Tricuspid regurgitation signal is inadequate for assessing PA pressure. Left Atrium: Left atrial size was normal in size. Right Atrium: Right atrial size was normal in size. Pericardium: There is no evidence of pericardial effusion. Presence of pericardial fat pad. Mitral Valve: The mitral valve is normal in structure. Trivial mitral valve regurgitation. No evidence of mitral valve stenosis. Tricuspid Valve: The tricuspid valve is normal in  structure. Tricuspid valve regurgitation is trivial. No evidence of tricuspid stenosis. Aortic Valve: The aortic valve is grossly normal. There is mild calcification of the aortic valve. Aortic valve regurgitation is not visualized. No aortic stenosis is present. Pulmonic Valve: The pulmonic valve was not well visualized. Pulmonic valve regurgitation is not visualized. Aorta: The aortic root and ascending aorta are structurally normal, with no evidence of dilitation. Venous: The inferior vena cava is dilated in size with less than 50% respiratory variability, suggesting right atrial pressure of 15 mmHg. IAS/Shunts: The atrial septum is grossly normal.  LEFT VENTRICLE PLAX 2D LVIDd:         3.80 cm LVIDs:         2.60 cm LV PW:         0.90 cm LV IVS:        0.90 cm LVOT diam:     2.00 cm LV SV:         43 LV SV Index:   23 LVOT Area:     3.14 cm  RIGHT VENTRICLE TAPSE (M-mode): 1.6 cm LEFT ATRIUM           Index LA diam:      2.50 cm 1.31 cm/m LA Vol (A2C): 6.1 ml   3.20 ml/m LA Vol (A4C): 13.4 ml 7.04 ml/m  AORTIC VALVE LVOT Vmax:   89.40 cm/s LVOT Vmean:  57.900 cm/s LVOT VTI:    0.137 m  AORTA Ao Root diam: 2.90 cm MITRAL VALVE MV Area (PHT): 3.27 cm    SHUNTS MV Decel Time: 232 msec    Systemic VTI:  0.14 m MV E velocity: 66.00 cm/s  Systemic Diam: 2.00 cm MV A velocity: 83.90 cm/s MV E/A ratio:  0.79 Buford Dresser MD Electronically signed by Buford Dresser MD Signature Date/Time: 08/17/2020/10:12:58 PM    Final    Scheduled Meds: . Chlorhexidine Gluconate Cloth  6 each Topical Daily  . influenza vaccine adjuvanted  0.5 mL Intramuscular Tomorrow-1000   Continuous Infusions: . cefTRIAXone (ROCEPHIN)  IV 1 g (08/17/20 1038)  . potassium PHOSPHATE IVPB (in mmol)      LOS: 2 days   Kerney Elbe, DO [facility] PAGER is on [place]  If 7PM-7AM, please contact night-coverage [email or web address]

## 2020-08-18 NOTE — Progress Notes (Addendum)
Progress Note  Patient Name: Curren Mohrmann Date of Encounter: 08/18/2020  Primary Cardiologist: Freada Bergeron, MD   Subjective   Reports some improvement in his abdominal pain this morning but still having significant bladder pain and feels like he is passing a kidney stone. No complaints of chest pain, SOB, or palpitations this morning.   Inpatient Medications    Scheduled Meds: . Chlorhexidine Gluconate Cloth  6 each Topical Daily  . influenza vaccine adjuvanted  0.5 mL Intramuscular Tomorrow-1000   Continuous Infusions: . cefTRIAXone (ROCEPHIN)  IV 1 g (08/17/20 1038)  . potassium PHOSPHATE IVPB (in mmol)     PRN Meds: HYDROmorphone (DILAUDID) injection, ondansetron **OR** ondansetron (ZOFRAN) IV   Vital Signs    Vitals:   08/17/20 0740 08/17/20 1302 08/17/20 2221 08/18/20 0554  BP: 119/66 (!) 105/59 107/71 108/71  Pulse: (!) 109 97 (!) 107 (!) 104  Resp: 18 18 20 20   Temp: 98.5 F (36.9 C) 98.3 F (36.8 C) 99.3 F (37.4 C) 99.8 F (37.7 C)  TempSrc: Oral Oral Axillary Axillary  SpO2: 92% 91% 93% 94%  Weight:      Height:        Intake/Output Summary (Last 24 hours) at 08/18/2020 0904 Last data filed at 08/18/2020 6629 Gross per 24 hour  Intake 1721.26 ml  Output 2500 ml  Net -778.74 ml   Filed Weights   08/16/20 2006 08/17/20 0147  Weight: 68 kg 68 kg    Telemetry    Sinus rhythm/sinus tachycardia with rate in the 90s-120s - Personally Reviewed  ECG    Sinus tachycardia with rate 105 bpm, improved STE and PR depressions, still with non-specific ST-T wave abnormalities  - Personally Reviewed  Physical Exam   GEN: No acute distress.   Neck: No JVD, no carotid bruits Cardiac: RRR, no murmurs, rubs, or gallops.  Respiratory: Clear to auscultation bilaterally, no wheezes/ rales/ rhonchi GI: quiet bowel sounds, Soft, colostomy bag/NG tube in place MS: No edema; No deformity. Neuro:  Nonfocal, moving all extremities spontaneously Psych:  Normal affect   Labs    Chemistry Recent Labs  Lab 08/16/20 1955 08/17/20 0430 08/18/20 0357  NA 144 140 144  K 4.0 3.9 3.6  CL 104 104 109  CO2 29 27 25   GLUCOSE 143* 129* 94  BUN 31* 27* 24*  CREATININE 1.48* 1.22 1.11  CALCIUM 14.5* 11.9* 9.5  PROT 8.7* 7.2 6.4*  ALBUMIN 3.8 3.1* 2.7*  AST 19 14* 12*  ALT 15 12 10   ALKPHOS 93 75 62  BILITOT 0.5 0.6 0.6  GFRNONAA 49* >60 >60  ANIONGAP 11 9 10      Hematology Recent Labs  Lab 08/16/20 1955 08/17/20 0430 08/18/20 0357  WBC 16.7* 12.5* 8.5  RBC 5.28 4.71 4.15*  HGB 15.6 13.9 12.2*  HCT 49.5 43.9 39.6  MCV 93.8 93.2 95.4  MCH 29.5 29.5 29.4  MCHC 31.5 31.7 30.8  RDW 13.6 13.4 13.6  PLT 348 298 252    Cardiac EnzymesNo results for input(s): TROPONINI in the last 168 hours. No results for input(s): TROPIPOC in the last 168 hours.   BNPNo results for input(s): BNP, PROBNP in the last 168 hours.   DDimer No results for input(s): DDIMER in the last 168 hours.   Radiology    DG Abd 1 View  Result Date: 08/18/2020 CLINICAL DATA:  Small-bowel obstruction. EXAM: ABDOMEN - 1 VIEW COMPARISON:  08/17/2020. FINDINGS: NG tube noted in stable position with its tip over  the stomach. Interim improvement of small-bowel distention. Air noted throughout the colon. No free air identified. Possibly noted over the left lower quadrant. Surgical clips noted over the pelvis. No acute bony abnormality. IMPRESSION: 1. NG tube noted in stable position with its tip over the stomach. 2. Interim improvement of small-bowel distention. Air noted throughout the colon. No free air identified. Electronically Signed   By: Marcello Moores  Register   On: 08/18/2020 05:48   DG Abd 1 View  Result Date: 08/17/2020 CLINICAL DATA:  Small-bowel obstruction. EXAM: ABDOMEN - 1 VIEW COMPARISON:  08/16/2020.  CT 08/16/2020. FINDINGS: NG tube tip noted over the upper stomach in unchanged position. Persistent prominently dilated loops of small bowel are noted. Ostomy  site over the left lower quadrant noted. Surgical clips over the pelvis noted. Contrast in the bladder. Degenerative change lumbar spine and both hips. IMPRESSION: NG tube tip noted over the upper stomach in unchanged position. Persistent prominently dilated loops of small bowel noted without interim change and consistent with small bowel obstruction. Electronically Signed   By: Marcello Moores  Register   On: 08/17/2020 05:40   DG Abdomen 1 View  Result Date: 08/16/2020 CLINICAL DATA:  NG tube placement EXAM: ABDOMEN - 1 VIEW COMPARISON:  CT 08/16/2020 FINDINGS: An enteric tube has been placed with tip in the left upper quadrant consistent with location in the upper stomach. Proximal side hole is positioned at or just below the level of the EG junction. Gas-filled dilated small bowel seen in the upper abdomen consistent with small bowel obstruction. Lung bases are clear. IMPRESSION: Enteric tube tip is in the left upper quadrant consistent with location in the upper stomach. Electronically Signed   By: Lucienne Capers M.D.   On: 08/16/2020 22:15   CT ABDOMEN PELVIS W CONTRAST  Result Date: 08/16/2020 CLINICAL DATA:  Nausea, vomiting.  Bowel obstruction suspected EXAM: CT ABDOMEN AND PELVIS WITH CONTRAST TECHNIQUE: Multidetector CT imaging of the abdomen and pelvis was performed using the standard protocol following bolus administration of intravenous contrast. CONTRAST:  187mL OMNIPAQUE IOHEXOL 300 MG/ML  SOLN COMPARISON:  11/15/2017 FINDINGS: Lower chest: Linear density peripherally in the right lung base, likely atelectasis. No effusions. Hepatobiliary: Numerous cysts throughout the liver. Gallbladder unremarkable. Pancreas: No focal abnormality or ductal dilatation. Spleen: No focal abnormality.  Normal size. Adrenals/Urinary Tract: Multiple calcifications in the right kidney. No obstruction. Areas of cortical thinning and scarring in the right kidney. Left renal cyst in the midpole measures 4 cm. No  hydronephrosis. Adrenal glands unremarkable. Bladder wall thickening measuring up to 12 mm on coronal imaging. Layering high-density material posteriorly in the bladder, likely numerous stones. Areas of calcification also noted linearly within the lumen of the bladder of unknown etiology. These could be along the thickened trabeculated bladder wall, but difficult to evaluate due to bladder decompression. Stomach/Bowel: Previously seen left lower quadrant ostomy has been taken down. There is herniation of small bowel loops through the ostomy defect. Dilated small bowel loops compatible with high-grade obstruction. Distal small bowel loops are decompressed. Stomach is dilated and fluid-filled. Large bowel decompressed. Vascular/Lymphatic: Aortic atherosclerosis. Reproductive: Mildly prominent prostate. Other: No free fluid or free air. Musculoskeletal: No acute bony abnormality. IMPRESSION: Small bowel herniation through the left lower quadrant the ostomy defect causing high-grade small bowel obstruction. Right nephrolithiasis. No hydronephrosis. Layering high-density material posteriorly in the urinary bladder, likely small layering stones. Markedly thickened bladder wall abnormal calcifications seen within the lumen of the bladder, difficult to determine exact source and  location. These could be along the thickened bladder wall. Aortic atherosclerosis. Hepatic and renal cysts. Electronically Signed   By: Rolm Baptise M.D.   On: 08/16/2020 21:33   DG Chest Port 1 View  Result Date: 08/16/2020 CLINICAL DATA:  Shortness of breath EXAM: PORTABLE CHEST 1 VIEW COMPARISON:  11/15/2017 FINDINGS: Right base atelectasis. Left lung clear. Heart is normal size. No effusions. No acute bony abnormality. IMPRESSION: Right base atelectasis. Electronically Signed   By: Rolm Baptise M.D.   On: 08/16/2020 20:34   ECHOCARDIOGRAM COMPLETE  Result Date: 08/17/2020    ECHOCARDIOGRAM REPORT   Patient Name:   MUAD Brouse Date of  Exam: 08/17/2020 Medical Rec #:  [id number]  Height:       73.0 in Accession #:    [id number] Weight:       149.9 lb Date of Birth:  21-Oct-1956 BSA:          1.904 m Patient Age:    52 years   BP:           105/59 mmHg Patient Gender: M          HR:           100 bpm. Exam Location:  Inpatient Procedure: 2D Echo Indications:    Chest Pain R07.9  History:        Patient has no prior history of Echocardiogram examinations.                 Risk Factors:Current Smoker.  Sonographer:    Mikki Santee RDCS (AE) Referring Phys: [id number] [place]  1. Left ventricular ejection fraction, by estimation, is 60 to 65%. The left ventricle has normal function. The left ventricle has no regional wall motion abnormalities. Left ventricular diastolic parameters were normal.  2. Right ventricular systolic function is normal. The right ventricular size is normal. Tricuspid regurgitation signal is inadequate for assessing PA pressure.  3. The mitral valve is normal in structure. Trivial mitral valve regurgitation. No evidence of mitral stenosis.  4. The aortic valve is grossly normal. There is mild calcification of the aortic valve. Aortic valve regurgitation is not visualized. No aortic stenosis is present.  5. The inferior vena cava is dilated in size with <50% respiratory variability, suggesting right atrial pressure of 15 mmHg. Comparison(s): No prior Echocardiogram. Conclusion(s)/Recommendation(s): Normal biventricular function without evidence of hemodynamically significant valvular heart disease. FINDINGS  Left Ventricle: Left ventricular ejection fraction, by estimation, is 60 to 65%. The left ventricle has normal function. The left ventricle has no regional wall motion abnormalities. The left ventricular internal cavity size was normal in size. There is  no left ventricular hypertrophy. Left ventricular diastolic parameters were normal. Right Ventricle: The right ventricular size is normal. No increase  in right ventricular wall thickness. Right ventricular systolic function is normal. Tricuspid regurgitation signal is inadequate for assessing PA pressure. Left Atrium: Left atrial size was normal in size. Right Atrium: Right atrial size was normal in size. Pericardium: There is no evidence of pericardial effusion. Presence of pericardial fat pad. Mitral Valve: The mitral valve is normal in structure. Trivial mitral valve regurgitation. No evidence of mitral valve stenosis. Tricuspid Valve: The tricuspid valve is normal in structure. Tricuspid valve regurgitation is trivial. No evidence of tricuspid stenosis. Aortic Valve: The aortic valve is grossly normal. There is mild calcification of the aortic valve. Aortic valve regurgitation is not visualized. No aortic stenosis is present. Pulmonic Valve: The pulmonic valve was not well  visualized. Pulmonic valve regurgitation is not visualized. Aorta: The aortic root and ascending aorta are structurally normal, with no evidence of dilitation. Venous: The inferior vena cava is dilated in size with less than 50% respiratory variability, suggesting right atrial pressure of 15 mmHg. IAS/Shunts: The atrial septum is grossly normal.  LEFT VENTRICLE PLAX 2D LVIDd:         3.80 cm LVIDs:         2.60 cm LV PW:         0.90 cm LV IVS:        0.90 cm LVOT diam:     2.00 cm LV SV:         43 LV SV Index:   23 LVOT Area:     3.14 cm  RIGHT VENTRICLE TAPSE (M-mode): 1.6 cm LEFT ATRIUM           Index LA diam:      2.50 cm 1.31 cm/m LA Vol (A2C): 6.1 ml  3.20 ml/m LA Vol (A4C): 13.4 ml 7.04 ml/m  AORTIC VALVE LVOT Vmax:   89.40 cm/s LVOT Vmean:  57.900 cm/s LVOT VTI:    0.137 m  AORTA Ao Root diam: 2.90 cm MITRAL VALVE MV Area (PHT): 3.27 cm    SHUNTS MV Decel Time: 232 msec    Systemic VTI:  0.14 m MV E velocity: 66.00 cm/s  Systemic Diam: 2.00 cm MV A velocity: 83.90 cm/s MV E/A ratio:  0.79 Buford Dresser MD Electronically signed by Buford Dresser MD Signature  Date/Time: 08/17/2020/10:12:58 PM    Final     Cardiac Studies   Echocardiogram 08/17/20: 1. Left ventricular ejection fraction, by estimation, is 60 to 65%. The  left ventricle has normal function. The left ventricle has no regional  wall motion abnormalities. Left ventricular diastolic parameters were  normal.  2. Right ventricular systolic function is normal. The right ventricular  size is normal. Tricuspid regurgitation signal is inadequate for assessing  PA pressure.  3. The mitral valve is normal in structure. Trivial mitral valve  regurgitation. No evidence of mitral stenosis.  4. The aortic valve is grossly normal. There is mild calcification of the  aortic valve. Aortic valve regurgitation is not visualized. No aortic  stenosis is present.  5. The inferior vena cava is dilated in size with <50% respiratory  variability, suggesting right atrial pressure of 15 mmHg.   Patient Profile     64 y.o. male with a history of bowel obstruction s/p partial bowel resection with subsequent colostomy placement in 2018, nephrolithiasis, urinary retention (self caths), tobacco abuse, and polysubstance abuse (marijuana/cocaine), who is being followed by cardiology for the evaluation of abnormal EKG concerning for possible pericarditis   Assessment & Plan    1. Chest discomfort and abnormal EKG: no prior history of CAD/CHF. Presented with severe "heartburn" and abdominal pain with N/V, found to have an SBO. EKG showed sinus tachycardia with diffuse STE and PR depression - reviewed with STEMI MD (Dr. Gwenlyn Found), who felt this was possible pericarditis. HsTrop negative x2. Repeat EKG showed improvement in STE/ PR depression, though still with non-specific ST-T wave abnormalities. Echo showed EF 60-65%, no RWMA, normal LV diastolic function, and no significant valvular abnormalities. Colchicine/NSAIDs held given NPO status and no significant chest pain currently. He does note some stable exertional  angina as well.  - A1C pending. Will add on Lipid panel for risk stratification - Can consider an outpatient ischemic evaluation with a NST vs coronary CTA if  HR improves.  - Anticipate addition of aspirin and statin when tolerating po  2. Sinus tachycardia: continues to have HR in the 90s-120s. Likely a physiologic response to SBO +/- UTI.  - Continue to monitor on telemetry for now  3. Tobacco abuse/polysubstance abuse: smokes 1/2 ppd and has occasional marijuana/cocaine use (last 1 week ago) - Continue to encourage cessation  Remainder of care per primary team: - SBO - UTI - Nephrolithiasis -Hypercalcemia - AKI   For questions or updates, please contact [place] Please consult [email or web address] for contact info under Cardiology/STEMI.      Signed, Abigail Butts, PA-C  08/18/2020, 9:04 AM   [phone]  Patient seen and examined and agree with Roby Lofts, PA.  In brief, patient is a 64 y.o. male with a history of bowel obstruction s/p partial bowel resection with subsequent colostomy placement in 2018, nephrolithiasis, urinary retention (self caths), tobacco abuse, and polysubstance abuse (marijuana/cocaine), who is being followed by cardiology for the evaluation of abnormal EKG concerning for possible pericarditis.  Patient stable without significant chest pain. TTE with normal BiV function and no effusion. Has exertional chest pressure but no evidence of ACS or symptoms at rest. Currently NPO.  Exam: General: Frail appearing male, appears older than stated age CV: tachycardic, regular, no significant murmurs Resp: Clear Abd: Soft, ND. Colostomy bag in place Extremities: Thin, warm, no edema Neuro: Nonfocal  Plan: -TTE reassuring with normal BiV function and no effusion -No need for NSAIDs/colchicine at this time as patient relatively asymptomatic -Will arrange for out-patient cardiology follow-up for exertional chest pain with plans for stress testing on  non-emergent basis once clinically improved  Cardiology will sign-off at this time and will arrange for follow-up with [place]. Please feel free to call back with any questions/concerns.  Gwyndolyn Kaufman, MD

## 2020-08-18 NOTE — Progress Notes (Signed)
[facility] Progress Note     Subjective: CC-  Continues to have some mild abdominal pain but states that it is less than yesterday. Denies n/v. Some sweat in colostomy pouch but no stool. NG tube with 950cc output since placement - drainage is now more green tinged. Patient states that he has been eating some ice chips. Xray today shows less small bowel dilation, air in colon.  Objective: Vital signs in last 24 hours: Temp:  [98.3 F (36.8 C)-99.8 F (37.7 C)] 99.8 F (37.7 C) (10/21 0554) Pulse Rate:  [97-107] 104 (10/21 0554) Resp:  [18-20] 20 (10/21 0554) BP: (105-108)/(59-71) 108/71 (10/21 0554) SpO2:  [91 %-94 %] 94 % (10/21 0554)    Intake/Output from previous day: 10/20 0701 - 10/21 0700 In: 1721.3 [I.V.:1501.3; NG/GT:120; IV Piggyback:100] Out: 2500 [Urine:1550; Emesis/NG output:950] Intake/Output this shift: No intake/output data recorded.  PE: Gen:  Alert, NAD Pulm: rate and effort normal Abd: Soft, ND, minimal right sided abdominal TTP, few BS heard, ostomy viable/ sweat noted in pouch but no stool GU: foley in place Skin: no rashes noted, warm and dry  Lab Results:  Recent Labs    08/17/20 0430 08/18/20 0357  WBC 12.5* 8.5  HGB 13.9 12.2*  HCT 43.9 39.6  PLT 298 252   BMET Recent Labs    08/17/20 0430 08/18/20 0357  NA 140 144  K 3.9 3.6  CL 104 109  CO2 27 25  GLUCOSE 129* 94  BUN 27* 24*  CREATININE 1.22 1.11  CALCIUM 11.9* 9.5   PT/INR Recent Labs    08/16/20 1955  LABPROT 14.2  INR 1.1   CMP     Component Value Date/Time   NA 144 08/18/2020 0357   K 3.6 08/18/2020 0357   CL 109 08/18/2020 0357   CO2 25 08/18/2020 0357   GLUCOSE 94 08/18/2020 0357   BUN 24 (H) 08/18/2020 0357   CREATININE 1.11 08/18/2020 0357   CALCIUM 9.5 08/18/2020 0357   PROT 6.4 (L) 08/18/2020 0357   ALBUMIN 2.7 (L) 08/18/2020 0357   AST 12 (L) 08/18/2020 0357   ALT 10 08/18/2020 0357   ALKPHOS 62 08/18/2020 0357   BILITOT 0.6  08/18/2020 0357   GFRNONAA >60 08/18/2020 0357   GFRAA >60 11/21/2019 1524   Lipase     Component Value Date/Time   LIPASE 43 08/16/2020 1955       Studies/Results: DG Abd 1 View  Result Date: 08/18/2020 CLINICAL DATA:  Small-bowel obstruction. EXAM: ABDOMEN - 1 VIEW COMPARISON:  08/17/2020. FINDINGS: NG tube noted in stable position with its tip over the stomach. Interim improvement of small-bowel distention. Air noted throughout the colon. No free air identified. Possibly noted over the left lower quadrant. Surgical clips noted over the pelvis. No acute bony abnormality. IMPRESSION: 1. NG tube noted in stable position with its tip over the stomach. 2. Interim improvement of small-bowel distention. Air noted throughout the colon. No free air identified. Electronically Signed   By: Marcello Moores  Register   On: 08/18/2020 05:48   DG Abd 1 View  Result Date: 08/17/2020 CLINICAL DATA:  Small-bowel obstruction. EXAM: ABDOMEN - 1 VIEW COMPARISON:  08/16/2020.  CT 08/16/2020. FINDINGS: NG tube tip noted over the upper stomach in unchanged position. Persistent prominently dilated loops of small bowel are noted. Ostomy site over the left lower quadrant noted. Surgical clips over the pelvis noted. Contrast in the bladder. Degenerative change lumbar spine and both hips. IMPRESSION: NG tube tip noted  over the upper stomach in unchanged position. Persistent prominently dilated loops of small bowel noted without interim change and consistent with small bowel obstruction. Electronically Signed   By: Marcello Moores  Register   On: 08/17/2020 05:40   DG Abdomen 1 View  Result Date: 08/16/2020 CLINICAL DATA:  NG tube placement EXAM: ABDOMEN - 1 VIEW COMPARISON:  CT 08/16/2020 FINDINGS: An enteric tube has been placed with tip in the left upper quadrant consistent with location in the upper stomach. Proximal side hole is positioned at or just below the level of the EG junction. Gas-filled dilated small bowel seen in the  upper abdomen consistent with small bowel obstruction. Lung bases are clear. IMPRESSION: Enteric tube tip is in the left upper quadrant consistent with location in the upper stomach. Electronically Signed   By: Lucienne Capers M.D.   On: 08/16/2020 22:15   CT ABDOMEN PELVIS W CONTRAST  Result Date: 08/16/2020 CLINICAL DATA:  Nausea, vomiting.  Bowel obstruction suspected EXAM: CT ABDOMEN AND PELVIS WITH CONTRAST TECHNIQUE: Multidetector CT imaging of the abdomen and pelvis was performed using the standard protocol following bolus administration of intravenous contrast. CONTRAST:  117mL OMNIPAQUE IOHEXOL 300 MG/ML  SOLN COMPARISON:  11/15/2017 FINDINGS: Lower chest: Linear density peripherally in the right lung base, likely atelectasis. No effusions. Hepatobiliary: Numerous cysts throughout the liver. Gallbladder unremarkable. Pancreas: No focal abnormality or ductal dilatation. Spleen: No focal abnormality.  Normal size. Adrenals/Urinary Tract: Multiple calcifications in the right kidney. No obstruction. Areas of cortical thinning and scarring in the right kidney. Left renal cyst in the midpole measures 4 cm. No hydronephrosis. Adrenal glands unremarkable. Bladder wall thickening measuring up to 12 mm on coronal imaging. Layering high-density material posteriorly in the bladder, likely numerous stones. Areas of calcification also noted linearly within the lumen of the bladder of unknown etiology. These could be along the thickened trabeculated bladder wall, but difficult to evaluate due to bladder decompression. Stomach/Bowel: Previously seen left lower quadrant ostomy has been taken down. There is herniation of small bowel loops through the ostomy defect. Dilated small bowel loops compatible with high-grade obstruction. Distal small bowel loops are decompressed. Stomach is dilated and fluid-filled. Large bowel decompressed. Vascular/Lymphatic: Aortic atherosclerosis. Reproductive: Mildly prominent prostate.  Other: No free fluid or free air. Musculoskeletal: No acute bony abnormality. IMPRESSION: Small bowel herniation through the left lower quadrant the ostomy defect causing high-grade small bowel obstruction. Right nephrolithiasis. No hydronephrosis. Layering high-density material posteriorly in the urinary bladder, likely small layering stones. Markedly thickened bladder wall abnormal calcifications seen within the lumen of the bladder, difficult to determine exact source and location. These could be along the thickened bladder wall. Aortic atherosclerosis. Hepatic and renal cysts. Electronically Signed   By: Rolm Baptise M.D.   On: 08/16/2020 21:33   DG Chest Port 1 View  Result Date: 08/16/2020 CLINICAL DATA:  Shortness of breath EXAM: PORTABLE CHEST 1 VIEW COMPARISON:  11/15/2017 FINDINGS: Right base atelectasis. Left lung clear. Heart is normal size. No effusions. No acute bony abnormality. IMPRESSION: Right base atelectasis. Electronically Signed   By: Rolm Baptise M.D.   On: 08/16/2020 20:34   ECHOCARDIOGRAM COMPLETE  Result Date: 08/17/2020    ECHOCARDIOGRAM REPORT   Patient Name:   MESSIAH Cermak Date of Exam: 08/17/2020 Medical Rec #:  [id number]  Height:       73.0 in Accession #:    [id number] Weight:       149.9 lb Date of Birth:  03-02-1956 BSA:  1.904 m Patient Age:    19 years   BP:           105/59 mmHg Patient Gender: M          HR:           100 bpm. Exam Location:  Inpatient Procedure: 2D Echo Indications:    Chest Pain R07.9  History:        Patient has no prior history of Echocardiogram examinations.                 Risk Factors:Current Smoker.  Sonographer:    Mikki Santee RDCS (AE) Referring Phys: [id number] [place]  1. Left ventricular ejection fraction, by estimation, is 60 to 65%. The left ventricle has normal function. The left ventricle has no regional wall motion abnormalities. Left ventricular diastolic parameters were normal.  2. Right ventricular  systolic function is normal. The right ventricular size is normal. Tricuspid regurgitation signal is inadequate for assessing PA pressure.  3. The mitral valve is normal in structure. Trivial mitral valve regurgitation. No evidence of mitral stenosis.  4. The aortic valve is grossly normal. There is mild calcification of the aortic valve. Aortic valve regurgitation is not visualized. No aortic stenosis is present.  5. The inferior vena cava is dilated in size with <50% respiratory variability, suggesting right atrial pressure of 15 mmHg. Comparison(s): No prior Echocardiogram. Conclusion(s)/Recommendation(s): Normal biventricular function without evidence of hemodynamically significant valvular heart disease. FINDINGS  Left Ventricle: Left ventricular ejection fraction, by estimation, is 60 to 65%. The left ventricle has normal function. The left ventricle has no regional wall motion abnormalities. The left ventricular internal cavity size was normal in size. There is  no left ventricular hypertrophy. Left ventricular diastolic parameters were normal. Right Ventricle: The right ventricular size is normal. No increase in right ventricular wall thickness. Right ventricular systolic function is normal. Tricuspid regurgitation signal is inadequate for assessing PA pressure. Left Atrium: Left atrial size was normal in size. Right Atrium: Right atrial size was normal in size. Pericardium: There is no evidence of pericardial effusion. Presence of pericardial fat pad. Mitral Valve: The mitral valve is normal in structure. Trivial mitral valve regurgitation. No evidence of mitral valve stenosis. Tricuspid Valve: The tricuspid valve is normal in structure. Tricuspid valve regurgitation is trivial. No evidence of tricuspid stenosis. Aortic Valve: The aortic valve is grossly normal. There is mild calcification of the aortic valve. Aortic valve regurgitation is not visualized. No aortic stenosis is present. Pulmonic Valve: The  pulmonic valve was not well visualized. Pulmonic valve regurgitation is not visualized. Aorta: The aortic root and ascending aorta are structurally normal, with no evidence of dilitation. Venous: The inferior vena cava is dilated in size with less than 50% respiratory variability, suggesting right atrial pressure of 15 mmHg. IAS/Shunts: The atrial septum is grossly normal.  LEFT VENTRICLE PLAX 2D LVIDd:         3.80 cm LVIDs:         2.60 cm LV PW:         0.90 cm LV IVS:        0.90 cm LVOT diam:     2.00 cm LV SV:         43 LV SV Index:   23 LVOT Area:     3.14 cm  RIGHT VENTRICLE TAPSE (M-mode): 1.6 cm LEFT ATRIUM           Index LA diam:  2.50 cm 1.31 cm/m LA Vol (A2C): 6.1 ml  3.20 ml/m LA Vol (A4C): 13.4 ml 7.04 ml/m  AORTIC VALVE LVOT Vmax:   89.40 cm/s LVOT Vmean:  57.900 cm/s LVOT VTI:    0.137 m  AORTA Ao Root diam: 2.90 cm MITRAL VALVE MV Area (PHT): 3.27 cm    SHUNTS MV Decel Time: 232 msec    Systemic VTI:  0.14 m MV E velocity: 66.00 cm/s  Systemic Diam: 2.00 cm MV A velocity: 83.90 cm/s MV E/A ratio:  0.79 Buford Dresser MD Electronically signed by Buford Dresser MD Signature Date/Time: 08/17/2020/10:12:58 PM    Final     Anti-infectives: Anti-infectives (From admission, onward)   Start     Dose/Rate Route Frequency Ordered Stop   08/17/20 0930  cefTRIAXone (ROCEPHIN) 1 g in sodium chloride 0.9 % 100 mL IVPB        1 g 200 mL/hr over 30 Minutes Intravenous Every 24 hours 08/17/20 0856         Assessment/Plan Chest pain, abnormal EKG - cardiology following  Tobacco abuse UTI Nephrolithiasis BPH - patient self-caths, foley placed Hypercalcemia - resolved AKI - resolved  SBO related to ostomy - Abdominal exam benign and xray shows improvement, although he does not have any output from colostomy yet. WBC WNL, afebrile. No role for acute surgical intervention. Continue NPO/NGT to LIWS. Mobilize.  ID - rocephin 10/20>> FEN - IVF, NPO/NGT to LIWS VTE - SCDs,  ok for chemical DVT prophylaxis from surgical standpint Foley - in place Follow up - TBD    LOS: 2 days    [name], [facility] 08/18/2020, 9:53 AM Please see Amion for pager number during day hours 7:00am-4:30pm

## 2020-08-19 ENCOUNTER — Inpatient Hospital Stay (HOSPITAL_COMMUNITY): Payer: Medicaid Other

## 2020-08-19 DIAGNOSIS — N390 Urinary tract infection, site not specified: Secondary | ICD-10-CM

## 2020-08-19 DIAGNOSIS — N200 Calculus of kidney: Secondary | ICD-10-CM | POA: Diagnosis not present

## 2020-08-19 DIAGNOSIS — R7303 Prediabetes: Secondary | ICD-10-CM

## 2020-08-19 DIAGNOSIS — N179 Acute kidney failure, unspecified: Secondary | ICD-10-CM | POA: Diagnosis not present

## 2020-08-19 DIAGNOSIS — K56609 Unspecified intestinal obstruction, unspecified as to partial versus complete obstruction: Secondary | ICD-10-CM | POA: Diagnosis not present

## 2020-08-19 DIAGNOSIS — B9620 Unspecified Escherichia coli [E. coli] as the cause of diseases classified elsewhere: Secondary | ICD-10-CM

## 2020-08-19 LAB — CBC WITH DIFFERENTIAL/PLATELET
Abs Immature Granulocytes: 0.05 10*3/uL (ref 0.00–0.07)
Basophils Absolute: 0.1 10*3/uL (ref 0.0–0.1)
Basophils Relative: 1 %
Eosinophils Absolute: 2 10*3/uL — ABNORMAL HIGH (ref 0.0–0.5)
Eosinophils Relative: 18 %
HCT: 39 % (ref 39.0–52.0)
Hemoglobin: 12.4 g/dL — ABNORMAL LOW (ref 13.0–17.0)
Immature Granulocytes: 1 %
Lymphocytes Relative: 8 %
Lymphs Abs: 0.8 10*3/uL (ref 0.7–4.0)
MCH: 30.1 pg (ref 26.0–34.0)
MCHC: 31.8 g/dL (ref 30.0–36.0)
MCV: 94.7 fL (ref 80.0–100.0)
Monocytes Absolute: 0.6 10*3/uL (ref 0.1–1.0)
Monocytes Relative: 6 %
Neutro Abs: 7.5 10*3/uL (ref 1.7–7.7)
Neutrophils Relative %: 66 %
Platelets: 249 10*3/uL (ref 150–400)
RBC: 4.12 MIL/uL — ABNORMAL LOW (ref 4.22–5.81)
RDW: 13.4 % (ref 11.5–15.5)
WBC: 11 10*3/uL — ABNORMAL HIGH (ref 4.0–10.5)
nRBC: 0 % (ref 0.0–0.2)

## 2020-08-19 LAB — COMPREHENSIVE METABOLIC PANEL
ALT: 11 U/L (ref 0–44)
AST: 12 U/L — ABNORMAL LOW (ref 15–41)
Albumin: 2.8 g/dL — ABNORMAL LOW (ref 3.5–5.0)
Alkaline Phosphatase: 60 U/L (ref 38–126)
Anion gap: 9 (ref 5–15)
BUN: 24 mg/dL — ABNORMAL HIGH (ref 8–23)
CO2: 24 mmol/L (ref 22–32)
Calcium: 8.8 mg/dL — ABNORMAL LOW (ref 8.9–10.3)
Chloride: 109 mmol/L (ref 98–111)
Creatinine, Ser: 0.83 mg/dL (ref 0.61–1.24)
GFR, Estimated: >60 mL/min (ref >60)
Glucose, Bld: 101 mg/dL — ABNORMAL HIGH (ref 70–99)
Potassium: 3.7 mmol/L (ref 3.5–5.1)
Sodium: 142 mmol/L (ref 135–145)
Total Bilirubin: 0.6 mg/dL (ref 0.3–1.2)
Total Protein: 6.7 g/dL (ref 6.5–8.1)

## 2020-08-19 LAB — HEMOGLOBIN A1C
Hgb A1c MFr Bld: 5.7 % — ABNORMAL HIGH (ref 4.8–5.6)
Mean Plasma Glucose: 117 mg/dL

## 2020-08-19 LAB — URINE CULTURE
Culture: >=100000 — AB
Culture: >=100000 — AB

## 2020-08-19 LAB — VITAMIN B12: Vitamin B-12: 118 pg/mL — ABNORMAL LOW (ref 180–914)

## 2020-08-19 LAB — IRON AND TIBC
Iron: 25 ug/dL — ABNORMAL LOW (ref 45–182)
Saturation Ratios: 10 % — ABNORMAL LOW (ref 17.9–39.5)
TIBC: 247 ug/dL — ABNORMAL LOW (ref 250–450)
UIBC: 222 ug/dL

## 2020-08-19 LAB — RETICULOCYTES
Immature Retic Fract: 6.7 % (ref 2.3–15.9)
RBC.: 4.08 MIL/uL — ABNORMAL LOW (ref 4.22–5.81)
Retic Count, Absolute: 36.3 10*3/uL (ref 19.0–186.0)
Retic Ct Pct: 0.9 % (ref 0.4–3.1)

## 2020-08-19 LAB — PHOSPHORUS: Phosphorus: 1.2 mg/dL — ABNORMAL LOW (ref 2.5–4.6)

## 2020-08-19 LAB — MAGNESIUM: Magnesium: 2 mg/dL (ref 1.7–2.4)

## 2020-08-19 LAB — FOLATE: Folate: 6.3 ng/mL (ref >5.9)

## 2020-08-19 LAB — FERRITIN: Ferritin: 156 ng/mL (ref 24–336)

## 2020-08-19 MED ORDER — POTASSIUM CHLORIDE 10 MEQ/100ML IV SOLN
10.0000 meq | INTRAVENOUS | Status: AC
  Administered 2020-08-19 (×3): 10 meq via INTRAVENOUS
  Filled 2020-08-19 (×3): qty 100

## 2020-08-19 MED ORDER — SODIUM PHOSPHATES 45 MMOLE/15ML IV SOLN
30.0000 mmol | Freq: Once | INTRAVENOUS | Status: AC
  Administered 2020-08-19: 30 mmol via INTRAVENOUS
  Filled 2020-08-19: qty 10

## 2020-08-19 MED ORDER — PANTOPRAZOLE SODIUM 40 MG IV SOLR
40.0000 mg | Freq: Two times a day (BID) | INTRAVENOUS | Status: DC
  Administered 2020-08-19 – 2020-08-25 (×13): 40 mg via INTRAVENOUS
  Filled 2020-08-19 (×13): qty 40

## 2020-08-19 MED ORDER — POTASSIUM PHOSPHATES 15 MMOLE/5ML IV SOLN: 30.0000 mmol | Freq: Once | INTRAVENOUS | Status: DC

## 2020-08-19 NOTE — Progress Notes (Signed)
Progress Note: General Surgery Service   Chief Complaint/Subjective: Some nausea, no gas or stool in bag, concern of red tinge to urine  Objective: Vital signs in last 24 hours: Temp:  [97.9 F (36.6 C)-98.4 F (36.9 C)] 97.9 F (36.6 C) (10/22 0450) Pulse Rate:  [61-96] 89 (10/22 0450) Resp:  [17-21] 17 (10/22 0450) BP: (105-110)/(66-72) 105/72 (10/22 0450) SpO2:  [94 %-95 %] 95 % (10/22 0450)    Intake/Output from previous day: 10/21 0701 - 10/22 0700 In: 1088 [I.V.:743.5; IV Piggyback:344.5] Out: 2575 [Urine:825; Emesis/NG output:1750] Intake/Output this shift: No intake/output data recorded.  Gen: NAD  Resp: nonlabored  Card: RRR  Abd: soft, ostomy pink without air or stool  Lab Results: CBC  Recent Labs    08/18/20 0357 08/19/20 0359  WBC 8.5 11.0*  HGB 12.2* 12.4*  HCT 39.6 39.0  PLT 252 249   BMET Recent Labs    08/18/20 0357 08/19/20 0359  NA 144 142  K 3.6 3.7  CL 109 109  CO2 25 24  GLUCOSE 94 101*  BUN 24* 24*  CREATININE 1.11 0.83  CALCIUM 9.5 8.8*   PT/INR Recent Labs    08/16/20 1955  LABPROT 14.2  INR 1.1   ABG No results for input(s): PHART, HCO3 in the last 72 hours.  Invalid input(s): PCO2, PO2  Anti-infectives: Anti-infectives (From admission, onward)   Start     Dose/Rate Route Frequency Ordered Stop   08/17/20 0930  cefTRIAXone (ROCEPHIN) 1 g in sodium chloride 0.9 % 100 mL IVPB        1 g 200 mL/hr over 30 Minutes Intravenous Every 24 hours 08/17/20 0856        Medications: Scheduled Meds: . Chlorhexidine Gluconate Cloth  6 each Topical Daily  . heparin injection (subcutaneous)  5,000 Units Subcutaneous Q8H  . tamsulosin  0.4 mg Oral Daily   Continuous Infusions: . sodium chloride 75 mL/hr at 08/19/20 0420  . cefTRIAXone (ROCEPHIN)  IV 1 g (08/18/20 0934)  . sodium phosphate  Dextrose 5% IVPB     PRN Meds:.HYDROmorphone (DILAUDID) injection, ondansetron **OR** ondansetron (ZOFRAN) IV,  opium-belladonna  Assessment/Plan: SBO related to ostomy - Abdominal exam benign, NG with 1700 ml out, dark with red tinge. Will get XR. It has been 48 h without improvement. He has a difficult clinical picture since he now has multiple issues that could be causing ileus (UTI, electrolyte abnormality, kidney stone), but he may require laparoscopy this afternoon to ensure there is no mechanical issue. -IV PPI due to concern for NG induced gastritis with blood  Hypophosphatemia - lower despite 41mmol given yesterday  ID - rocephin 10/20>> FEN - IVF, NPO/NGT to LIWS VTE - SCDs, ok for chemical DVT prophylaxis from surgical standpint Foley - in place Follow up - TBD   LOS: 3 days   Mickeal Skinner, MD [facility]

## 2020-08-19 NOTE — Progress Notes (Signed)
MD [facility] notified for foley orders.

## 2020-08-19 NOTE — Progress Notes (Signed)
PROGRESS NOTE    Jamie Wilson  TKW:[id number] DOB: Dec 11, 1955 DOA: 08/16/2020 PCP: Patient, [name]   Brief Narrative:  HPI per Dr. Zada Finders on 08/16/20 Jamie Wilson is a 64 y.o. 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  Patient states he has a history of bowel obstruction for which partial resection was performed in January 2018 with subsequent colostomy placement.  He says he was in prison at the time of the surgery.  He has not had routine surgical follow-up since then.  He has had several days of worsening abdominal pain with nausea and vomiting.  He has had decreased output from his colostomy.  He had worsening emesis today and came to the ED for further evaluation.  He says while in the waiting area he vomited 4 more times.  He says he has a history of kidney stones and has been having bilateral flank pain.  He says he has to self cath for urine output and does have some discomfort when he urinates.  He also reports significant heartburn sensation and chest discomfort recently.  He says on arrival to the ED he was having significant trouble breathing which has now resolved since NG tube was placed.  He says he currently lives in a boardinghouse.  He is not in contact with close family.  He reports smoking about 5 cigars per day. He reports occasional alcohol use about once a week.  He denies any history of alcohol withdrawal.  He reports occasional marijuana use and denies any cocaine or IV drug use.  He says he is not taking any medications regularly.  He is not on any calcium or vitamin supplements.  ED Course:  Initial vitals showed BP 142/84, pulse 125, RR 30, temp 97.7 Fahrenheit, SPO2 92% on room air.  Labs significant for calcium 14.5, BUN 31, creatinine 1.48, sodium 144, potassium 4.0, bicarb 29, serum glucose 143, albumin 3.8,  LFTs within normal limits, WBC 16.7, hemoglobin 15.6, platelets 348,000, lactic acid 1.4, lipase 43, high-sensitivity troponin I 5 > 4.  FOBT is negative.  Blood cultures were obtained and pending.  SARS-CoV-2 PCR is negative.  Influenza A/B PCR are negative.  Portable chest x-ray showed right base atelectasis.  CT abdomen/pelvis with contrast shows small bowel herniation through the left lower quadrant ostomy defect causing high-grade small bowel obstruction.  Right nephrolithiasis without hydronephrosis seen as well as layering high density material in the posterior urinary bladder.  NG tube was placed in follow-up imaging showed enteric tube tip in the left upper quadrant consistent with location in the upper stomach.  EDP discussed with on-call general surgery team will evaluate in the morning.  Medical admission was requested and the hospitalist service was consulted to admit for further evaluation and management.  Patient was given 1 L LR, 4 mg IV Zofran, and 40 mg IV Protonix while in the ED.  **Interim History Patient was admitted for small bowel obstruction and incidentally his EKG showed mildly diffuse ST elevation so cardiology was consulted and felt that patient possibly has pericarditis.  General surgery recommending continuing the NG tube in place and will continue n.p.o. and supportive care for now.  NG tube is connected to suction and patient is less distended today but still complaining of abdominal soreness.  08/18/20: KUB showing some improvement however patient did not have any output from his ostomy yet  still.  Complaining of burning and discomfort in her urine and feels like his bladder spasming cardiology consulted for his diffuse ST elevations and echo showed an EF of 60 to 65% with normal diastolic function.  Cardiology recommending outpatient ischemic evaluation with NST versus coronary CTA if his heart rate improves and they will add aspirin and statin when tolerating  p.o..  08/19/20: Patient still has not had any output from his ostomy bag and urine culture is growing E. coli which is sensitive to ceftriaxone. Foley catheter will remain in place. NG tube in place and still putting out and KUB showing a nonobstructive bowel gas pattern and air throughout the colon that is unchanged. Because he has not had any improvement in the last 48 hours, general surgery considering a laparoscopic evaluation this afternoon to ensure that there is no mechanical issue noted. General surgery also added IV PPI due to concern for NG induced gastritis  Assessment & Plan:   Principal Problem:   Small bowel obstruction (HCC) Active Problems:   Hypercalcemia   AKI (acute kidney injury) ([place])   Right nephrolithiasis  Small bowel obstruction secondary to herniation through ostomy: -General surgery consulted and patient now has an NG tube and hernia appears to be reduced. -NG tube in place, continue low intermittent suction for now and general surgery to reassess the tube later today -CT Abdomen showed "Small bowel herniation through the left lower quadrant the ostomy defect causing high-grade small bowel obstruction."  -Monitor strict I/O's -Keep n.p.o. and continue bowel rest; now has some ice chips -Repeat KUB in a.m. again; KUB this AM showed "Nonobstructive bowel gas pattern. Air throughout the colon unchanged." -WBC Went from 16.7 -> 12.5 -> and normalized to 8.5 yesterday but is now slightly worse at 11.0 today -Antiemetics and analgesics as needed; continue with p.o./IV ondansetron 4 mg every 6 hours as needed nausea as well as 0.5 mg IV hydromorphone every 4 hours as needed severe pain -Continue with supportive care mobilize the patient.  Ensure potassium is greater than 4 and magnesium is greater than 2; patient's magnesium was 2.0 but his potassium was 3.7 so this was replete with IV KCl 30 mEq again plus the IV K-Phos 30 mmol as below -General surgery recommending  conservative measures but since the patient has failed to progress in the last 48 hours and because he is putting out a lot of fluid and had 7000 mL out from his NG with dark red tinge General surgery is going to evaluate for laparoscopic this afternoon to ensure there is no mechanical issue since he now has multiple issues that can be causing ileus  NG induced Gastritis -There is Concern given that he had dark red-tinged output from his NG this morning  -General surgery is adding IV PPI Pantoprazole 40 mg q12h  Hypercalcemia -Corrected calcium 14.7 on admission.  Last calcium 9.4 11/21/2019. -Start IV fluid hydration with NS@ 150 mL/hour and will continue for 16 hours and now that has stopped and IVF resumed at 75 mL/hr given that he is still npo -Started IV calcitonin 4 units/kg sq twice daily for max 4 doses and now stopped  -Give IV zoledronic acid 4 mg once now -PTH intact was 8, and Calicum Total (PTH) was 11.8 -Strict urine output, bladder scan and in and out cath as needed -Repeat Labs improving and Ca2+ is now 8.8 and corrected for Albumin of 2.8 the Ca2+ is 9.8 -C/w IVF Hydration  Acute Kidney Injury, improving  -Likely  prerenal from SBO and GI losses.   -Patient's BUN/Cr went from 31/1.48 -> 27/1.22 -> 24/1.11 -> 24/0.83 -He was given 1 L lactated Ringer's bolus and started on IV fluid hydration with normal saline at 150 MLS per hour for 16 hours and this expired so will renew IVF with NS at 75 mL/hr -Continue IV fluid hydration as above and repeat labs in a.m. -Avoid nephrotoxic medications, contrast dyes, hypotension and renally dose medications -Continue monitor and trend renal function and repeat CMP in a.m.  Right Nephrolithiasis: -Seen on CT imaging without hydronephrosis.   -Small layering stones noted in the urinary bladder as well as abnormal calcifications within the lumen of the bladder, may be along the thickened bladder wall per radiology report. -Continue IV  fluids and analgesics as above -Will add Tamsulosin 0.4 mg Daily -Also will add B&O Suppositories  -U/A as below  BPH -Will place Foley Catheter as patient Self Cath's -Discussed with Urology Dr. Diona Fanti and recommended Foley -Will add Tamsulosin as above  E Coli UTI, poA -In the setting of Self Cath and SBO -WBC on admission was 16.7 and trended down to 12.5 and could be in the setting of dehydration, however urinalysis showed a cloudy appearance with moderate hemoglobin, 5 ketones, moderate leukocytes, positive nitrites, 100 protein, greater than 1.046 specific gravity, few bacteria, greater than 50 RBCs per high-power field, and greater than 50 WBCs -Urine culture still pending sensitivities but show >100,000 CFU of GNR x2; Susceptibilities showed E Coli with sensitivities to cefazolin, ceftriaxone, gentamicin, imipenem, and nitrofurantoin -Will empirically start antibiotics with IV Ceftriaxone 1 g every 24 hours and escalate/de-escalate as needed -CTA showed "Right nephrolithiasis. No hydronephrosis. Layering high-density material posteriorly in the urinary bladder, likely small layering stones. Markedly thickened bladder wall abnormal calcifications seen within the lumen of the bladder, difficult to determine exact source and location. These could be along the thickened bladder wall. Aortic atherosclerosis. Hepatic and renal cysts."  Abnormal EKG with Concern for Pericarditis -Patient had an abnormal EKG on Admission with mild ST Elevation which was diffuse -Cardiology consulted and reviewed EKG and felt it not to be a STEMI but felt it was possible Pericarditis -High-sensitivity troponins were done and negative x2 as initial 1 was 5 and repeat was 4 -Cardiology recommending repeating EKG in the morning and possibly an echo for signs of pericardial effusion -Currently patient remains n.p.o. and is on bowel rest today have not initiated colchicine or NSAIDs for now -ECHO done and  normal -Cardiology checked lipid panel showed a total cholesterol/HDL ratio of 2.7, cholesterol level of 105, HDL 39, LDL 51, triglycerides of 74, VLDL 15 -Cardiology planning on NST as an outpatient or Coronary CTA in the outpatient and will add ASA and Statin when taking po  -Cardiology recommending no NSAIDs or colchicine at this time given the patient is relatively asymptomatic and there have been to arrange outpatient cardiology follow-up for exertional chest pain with plans for stress testing on a nonemergent basis once he is clinically improved  Hyperglycemia in the setting of Prediabetes -Patient's blood sugar on admission was 143 and repeat this morning was 101 -Check hemoglobin A1c to rule out diabetes and still pending to be done -Continue to monitor blood sugars carefully and if necessary will place on sensitive sliding scale insulin AC  Hypophosphatemia -Patient's Phos Level this AM was 1.2 -Replete with IV K Phos 30 mmol again -Continue to Monitor and Replete as Necessary -Repeat Phos Level in the AM  Normocytic Anemia -? Dilutional Drop -Patient's Hgb/Hct went from 15.6/49.5 (Likely hemoconcentrated on admission) and trended down to 13.9/43.9 -> 12.2/39.6 -> 12.4/39.0 -Check Anemia Panel and showed an iron level of 25, U IBC of 222, TIBC of 247, saturation ratios of 10%, ferritin level 156, folate level 6.3, vitamin B12 118 -We will not give IV iron supplementation given his urinary tract infection -Continue to Monitor for S/Sx of Bleeding; Currently no overt bleeding noted -Repeat CBC in the AM  Sinus Tachycardia -Likely physiological response in relation to his small bowel obstruction and likely UTI as well as flank pain from nephrolithiasis -Cardiology recommending to continue to monitor on telemetry for now    DVT prophylaxis: SCDs; will add Heparin 5,000 units sq q8h Code Status: FULL CODE Family Communication: None Disposition Plan: Pending further clinical  improvement back to baseline and tolerance of p.o. diet and clearance by cardiology and general surgery  Status is: Inpatient  Remains inpatient appropriate because:Unsafe d/c plan, IV treatments appropriate due to intensity of illness or inability to take PO and Inpatient level of care appropriate due to severity of illness   Dispo: The patient is from: Home              Anticipated d/c is to: Home              Anticipated d/c date is: 1-2 days when he is able to tolerate p.o.              Patient currently is not medically stable to d/c.  Consultants:   General Surgery  Cardiology  Discussed Case with Urology   Procedures: NGT insertion; possible Laparoscopy   Antimicrobials:  Anti-infectives (From admission, onward)   Start     Dose/Rate Route Frequency Ordered Stop   08/17/20 0930  cefTRIAXone (ROCEPHIN) 1 g in sodium chloride 0.9 % 100 mL IVPB        1 g 200 mL/hr over 30 Minutes Intravenous Every 24 hours 08/17/20 0856         Subjective: Seen and examined at bedside and and still having tremendous amount of output from his NG tube and is now dark and slightly red-tinged.  Complaining of some burning in his urinary tract infection but states when his catheter is gravity drainage is not having issues.  Still having some abdominal tenderness.  No stool in his ostomy bag.  No other concerns or plans at this time he understands that he may be going for a laparoscopic procedure later today.  Objective: Vitals:   08/18/20 0554 08/18/20 1305 08/18/20 2135 08/19/20 0450  BP: 108/71 110/66 108/68 105/72  Pulse: (!) 104 61 96 89  Resp: 20 (!) 21 17 17   Temp: 99.8 F (37.7 C) 98.4 F (36.9 C) 98.2 F (36.8 C) 97.9 F (36.6 C)  TempSrc: Axillary  Axillary   SpO2: 94% 94% 95% 95%  Weight:      Height:        Intake/Output Summary (Last 24 hours) at 08/19/2020 1143 Last data filed at 08/19/2020 0827 Gross per 24 hour  Intake 1088 ml  Output 4475 ml  Net -3387 ml    Filed Weights   08/16/20 2006 08/17/20 0147  Weight: 68 kg 68 kg   Examination: Physical Exam:  Constitutional: WN/WD slightly disheveled Caucasian male currently in no acute distress appears calm but still appears uncomfortable complaining of some abdominal tenderness Eyes: Lids and conjunctivae normal, sclerae anicteric  ENMT: External Ears, Nose appear normal. Grossly  normal hearing.  NG tube in place Neck: Appears normal, supple, no cervical masses, normal ROM, no appreciable thyromegaly; no JVD Respiratory: Diminished to auscultation bilaterally, no wheezing, rales, rhonchi or crackles. Normal respiratory effort and patient is not tachypenic. No accessory muscle use.  Unlabored breathing Cardiovascular: RRR, no murmurs / rubs / gallops. S1 and S2 auscultated.  No appreciable extremity edema Abdomen: Soft, tender to palpate, slightly distended.  Colostomy in place with no output bowel sounds positive.  GU: Deferred.  Foley catheter in place with darker color urine in place Musculoskeletal: No clubbing / cyanosis of digits/nails. No joint deformity upper and lower extremities.  Skin: No rashes, lesions, ulcers. No induration; Warm and dry.  Neurologic: CN 2-12 grossly intact with no focal deficits. Romberg sign and cerebellar reflexes not assessed.  Psychiatric: Normal judgment and insight. Alert and oriented x 3. Normal mood and appropriate affect.    Data Reviewed: I have personally reviewed following labs and imaging studies  CBC: Recent Labs  Lab 08/16/20 1955 08/17/20 0430 08/18/20 0357 08/19/20 0359  WBC 16.7* 12.5* 8.5 11.0*  NEUTROABS 14.6*  --  6.3 7.5  HGB 15.6 13.9 12.2* 12.4*  HCT 49.5 43.9 39.6 39.0  MCV 93.8 93.2 95.4 94.7  PLT 348 298 252 166   Basic Metabolic Panel: Recent Labs  Lab 08/16/20 1955 08/17/20 0430 08/18/20 0357 08/19/20 0359  NA 144 140 144 142  K 4.0 3.9 3.6 3.7  CL 104 104 109 109  CO2 29 27 25 24   GLUCOSE 143* 129* 94 101*  BUN  31* 27* 24* 24*  CREATININE 1.48* 1.22 1.11 0.83  CALCIUM 14.5* 11.9*  11.8* 9.5 8.8*  MG  --   --  2.0 2.0  PHOS  --   --  1.7* 1.2*   GFR: Estimated Creatinine Clearance: 85.3 mL/min (by C-G formula based on SCr of 0.83 mg/dL). Liver Function Tests: Recent Labs  Lab 08/16/20 1955 08/17/20 0430 08/18/20 0357 08/19/20 0359  AST 19 14* 12* 12*  ALT 15 12 10 11   ALKPHOS 93 75 62 60  BILITOT 0.5 0.6 0.6 0.6  PROT 8.7* 7.2 6.4* 6.7  ALBUMIN 3.8 3.1* 2.7* 2.8*   Recent Labs  Lab 08/16/20 1955  LIPASE 43   No results for input(s): AMMONIA in the last 168 hours. Coagulation Profile: Recent Labs  Lab 08/16/20 1955  INR 1.1   Cardiac Enzymes: No results for input(s): CKTOTAL, CKMB, CKMBINDEX, TROPONINI in the last 168 hours. BNP (last 3 results) No results for input(s): PROBNP in the last 8760 hours. HbA1C: Recent Labs    08/18/20 0357  HGBA1C 5.7*   CBG: No results for input(s): GLUCAP in the last 168 hours. Lipid Profile: Recent Labs    08/18/20 0357  CHOL 105  HDL 39*  LDLCALC 51  TRIG 74  CHOLHDL 2.7   Thyroid Function Tests: No results for input(s): TSH, T4TOTAL, FREET4, T3FREE, THYROIDAB in the last 72 hours. Anemia Panel: Recent Labs    08/19/20 0359  VITAMINB12 118*  FOLATE 6.3  FERRITIN 156  TIBC 247*  IRON 25*  RETICCTPCT 0.9   Sepsis Labs: Recent Labs  Lab 08/16/20 1955  LATICACIDVEN 1.4    Recent Results (from the past 240 hour(s))  Blood Culture (routine x 2)     Status: None (Preliminary result)   Collection Time: 08/16/20  7:55 PM   Specimen: BLOOD  Result Value Ref Range Status   Specimen Description   Final    BLOOD  RIGHT ANTECUBITAL Performed at [facility] [address], [place], [place] [address]    Special Requests   Final    BOTTLES DRAWN AEROBIC AND ANAEROBIC Blood Culture adequate volume Performed at [facility] [address], [place], [place] [address]    Culture    Final    NO GROWTH 3 DAYS Performed at [facility], [place] [address], [place], [place] [address]    Report Status PENDING  Incomplete  Blood Culture (routine x 2)     Status: None (Preliminary result)   Collection Time: 08/16/20  8:00 PM   Specimen: BLOOD  Result Value Ref Range Status   Specimen Description   Final    BLOOD LEFT ANTECUBITAL Performed at [facility] [address], [place], [place] [address]    Special Requests   Final    BOTTLES DRAWN AEROBIC AND ANAEROBIC Blood Culture adequate volume Performed at [facility] [address] [place], [place] [address]    Culture   Final    NO GROWTH 3 DAYS Performed at [facility], [place] [address] [place], [place] [address]    Report Status PENDING  Incomplete  Respiratory Panel by RT PCR (Flu A&B, Covid) - Nasopharyngeal Swab     Status: None   Collection Time: 08/16/20  8:15 PM   Specimen: Nasopharyngeal Swab  Result Value Ref Range Status   SARS Coronavirus 2 by RT PCR NEGATIVE NEGATIVE Final    Comment: (NOTE) SARS-CoV-2 target nucleic acids are NOT DETECTED.  The SARS-CoV-2 RNA is generally detectable in upper respiratoy specimens during the acute phase of infection. The lowest concentration of SARS-CoV-2 viral copies this assay can detect is 131 copies/mL. A negative result does not preclude SARS-Cov-2 infection and should not be used as the sole basis for treatment or other patient management decisions. A negative result may occur with  improper specimen collection/handling, submission of specimen other than nasopharyngeal swab, presence of viral mutation(s) within the areas targeted by this assay, and inadequate number of viral copies (<131 copies/mL). A negative result must be combined with clinical observations, patient history, and epidemiological information. The expected result is Negative.  Fact Sheet for Patients:   PinkCheek.be  Fact Sheet for Healthcare Providers:  GravelBags.it  This test is no t yet approved or cleared by the [place] FDA and  has been authorized for detection and/or diagnosis of SARS-CoV-2 by FDA under an Emergency Use Authorization (EUA). This EUA will remain  in effect (meaning this test can be used) for the duration of the COVID-19 declaration under Section 564(b)(1) of the Act, 21 U.S.C. section 360bbb-3(b)(1), unless the authorization is terminated or revoked sooner.     Influenza A by PCR NEGATIVE NEGATIVE Final   Influenza B by PCR NEGATIVE NEGATIVE Final    Comment: (NOTE) The Xpert Xpress SARS-CoV-2/FLU/RSV assay is intended as an aid in  the diagnosis of influenza from Nasopharyngeal swab specimens and  should not be used as a sole basis for treatment. Nasal washings and  aspirates are unacceptable for Xpert Xpress SARS-CoV-2/FLU/RSV  testing.  Fact Sheet for Patients: PinkCheek.be  Fact Sheet for Healthcare Providers: GravelBags.it  This test is not yet approved or cleared by the [place] FDA and  has been authorized for detection and/or diagnosis of SARS-CoV-2 by  FDA under an Emergency Use Authorization (EUA). This EUA will remain  in effect (meaning this test can be used) for the  duration of the  Covid-19 declaration under Section 564(b)(1) of the Act, 21  U.S.C. section 360bbb-3(b)(1), unless the authorization is  terminated or revoked. Performed at [facility], [place] [address] [place], [place] [address]   Urine culture     Status: Abnormal   Collection Time: 08/17/20  2:00 AM   Specimen: In/Out Cath Urine  Result Value Ref Range Status   Specimen Description   Final    IN/OUT CATH URINE Performed at [facility] [address] [place], [place] [address]    Special Requests   Final     NONE Performed at [facility], [place] [address] [place], [place] [address]    Culture >=100,000 COLONIES/mL ESCHERICHIA COLI (A)  Final   Report Status 08/19/2020 FINAL  Final   Organism ID, Bacteria ESCHERICHIA COLI (A)  Final      Susceptibility   Escherichia coli - MIC*    AMPICILLIN >=32 RESISTANT Resistant     CEFAZOLIN 16 SENSITIVE Sensitive     CEFTRIAXONE <=0.25 SENSITIVE Sensitive     CIPROFLOXACIN >=4 RESISTANT Resistant     GENTAMICIN <=1 SENSITIVE Sensitive     IMIPENEM <=0.25 SENSITIVE Sensitive     NITROFURANTOIN <=16 SENSITIVE Sensitive     TRIMETH/SULFA >=320 RESISTANT Resistant     AMPICILLIN/SULBACTAM >=32 RESISTANT Resistant     PIP/TAZO <=4 SENSITIVE Sensitive     * >=100,000 COLONIES/mL ESCHERICHIA COLI  Culture, Urine     Status: Abnormal   Collection Time: 08/17/20  9:50 AM   Specimen: Urine, Random  Result Value Ref Range Status   Specimen Description   Final    URINE, RANDOM Performed at [facility] [address], [place], [place] [address]    Special Requests   Final    NONE Performed at [facility], [place] [address], [place], [place] [address]    Culture >=100,000 COLONIES/mL ESCHERICHIA COLI (A)  Final   Report Status 08/19/2020 FINAL  Final   Organism ID, Bacteria ESCHERICHIA COLI (A)  Final      Susceptibility   Escherichia coli - MIC*    AMPICILLIN >=32 RESISTANT Resistant     CEFAZOLIN 16 SENSITIVE Sensitive     CEFTRIAXONE <=0.25 SENSITIVE Sensitive     CIPROFLOXACIN >=4 RESISTANT Resistant     GENTAMICIN <=1 SENSITIVE Sensitive     IMIPENEM <=0.25 SENSITIVE Sensitive     NITROFURANTOIN <=16 SENSITIVE Sensitive     TRIMETH/SULFA >=320 RESISTANT Resistant     AMPICILLIN/SULBACTAM >=32 RESISTANT Resistant     PIP/TAZO 64 INTERMEDIATE Intermediate     * >=100,000 COLONIES/mL ESCHERICHIA COLI    RN Pressure Injury Documentation:     Estimated body mass index is 19.78 kg/m as  calculated from the following:   Height as of this encounter: 6\' 1"  (1.854 m).   Weight as of this encounter: 68 kg.  Malnutrition Type:      Malnutrition Characteristics:      Nutrition Interventions:    Radiology Studies: DG Abd 1 View  Result Date: 08/18/2020 CLINICAL DATA:  Small-bowel obstruction. EXAM: ABDOMEN - 1 VIEW COMPARISON:  08/17/2020. FINDINGS: NG tube noted in stable position with its tip over the stomach. Interim improvement of small-bowel distention. Air noted throughout the colon. No free air identified. Possibly noted over the left lower quadrant. Surgical clips noted over the pelvis. No acute bony abnormality. IMPRESSION: 1. NG tube noted in stable position with its tip over the  stomach. 2. Interim improvement of small-bowel distention. Air noted throughout the colon. No free air identified. Electronically Signed   By: Marcello Moores  Register   On: 08/18/2020 05:48   DG Abd Portable 1V  Result Date: 08/19/2020 CLINICAL DATA:  Distended abdomen EXAM: PORTABLE ABDOMEN - 1 VIEW COMPARISON:  08/18/2020 FINDINGS: Gas in nondilated colon.  Small bowel nondilated. Left colostomy noted. NG tube in the body the stomach. Ventral hernia repair with mesh. IMPRESSION: Nonobstructive bowel gas pattern. Air throughout the colon unchanged. Electronically Signed   By: Franchot Gallo M.D.   On: 08/19/2020 08:18   ECHOCARDIOGRAM COMPLETE  Result Date: 08/17/2020    ECHOCARDIOGRAM REPORT   Patient Name:   Jamie Wilson Date of Exam: 08/17/2020 Medical Rec #:  [id number]  Height:       73.0 in Accession #:    [id number] Weight:       149.9 lb Date of Birth:  10-Jan-1956 BSA:          1.904 m Patient Age:    34 years   BP:           105/59 mmHg Patient Gender: M          HR:           100 bpm. Exam Location:  Inpatient Procedure: 2D Echo Indications:    Chest Pain R07.9  History:        Patient has no prior history of Echocardiogram examinations.                 Risk Factors:Current Smoker.   Sonographer:    Mikki Santee RDCS (AE) Referring Phys: [id number] [place]  1. Left ventricular ejection fraction, by estimation, is 60 to 65%. The left ventricle has normal function. The left ventricle has no regional wall motion abnormalities. Left ventricular diastolic parameters were normal.  2. Right ventricular systolic function is normal. The right ventricular size is normal. Tricuspid regurgitation signal is inadequate for assessing PA pressure.  3. The mitral valve is normal in structure. Trivial mitral valve regurgitation. No evidence of mitral stenosis.  4. The aortic valve is grossly normal. There is mild calcification of the aortic valve. Aortic valve regurgitation is not visualized. No aortic stenosis is present.  5. The inferior vena cava is dilated in size with <50% respiratory variability, suggesting right atrial pressure of 15 mmHg. Comparison(s): No prior Echocardiogram. Conclusion(s)/Recommendation(s): Normal biventricular function without evidence of hemodynamically significant valvular heart disease. FINDINGS  Left Ventricle: Left ventricular ejection fraction, by estimation, is 60 to 65%. The left ventricle has normal function. The left ventricle has no regional wall motion abnormalities. The left ventricular internal cavity size was normal in size. There is  no left ventricular hypertrophy. Left ventricular diastolic parameters were normal. Right Ventricle: The right ventricular size is normal. No increase in right ventricular wall thickness. Right ventricular systolic function is normal. Tricuspid regurgitation signal is inadequate for assessing PA pressure. Left Atrium: Left atrial size was normal in size. Right Atrium: Right atrial size was normal in size. Pericardium: There is no evidence of pericardial effusion. Presence of pericardial fat pad. Mitral Valve: The mitral valve is normal in structure. Trivial mitral valve regurgitation. No evidence of mitral valve  stenosis. Tricuspid Valve: The tricuspid valve is normal in structure. Tricuspid valve regurgitation is trivial. No evidence of tricuspid stenosis. Aortic Valve: The aortic valve is grossly normal. There is mild calcification of the aortic valve. Aortic valve regurgitation is not visualized. No  aortic stenosis is present. Pulmonic Valve: The pulmonic valve was not well visualized. Pulmonic valve regurgitation is not visualized. Aorta: The aortic root and ascending aorta are structurally normal, with no evidence of dilitation. Venous: The inferior vena cava is dilated in size with less than 50% respiratory variability, suggesting right atrial pressure of 15 mmHg. IAS/Shunts: The atrial septum is grossly normal.  LEFT VENTRICLE PLAX 2D LVIDd:         3.80 cm LVIDs:         2.60 cm LV PW:         0.90 cm LV IVS:        0.90 cm LVOT diam:     2.00 cm LV SV:         43 LV SV Index:   23 LVOT Area:     3.14 cm  RIGHT VENTRICLE TAPSE (M-mode): 1.6 cm LEFT ATRIUM           Index LA diam:      2.50 cm 1.31 cm/m LA Vol (A2C): 6.1 ml  3.20 ml/m LA Vol (A4C): 13.4 ml 7.04 ml/m  AORTIC VALVE LVOT Vmax:   89.40 cm/s LVOT Vmean:  57.900 cm/s LVOT VTI:    0.137 m  AORTA Ao Root diam: 2.90 cm MITRAL VALVE MV Area (PHT): 3.27 cm    SHUNTS MV Decel Time: 232 msec    Systemic VTI:  0.14 m MV E velocity: 66.00 cm/s  Systemic Diam: 2.00 cm MV A velocity: 83.90 cm/s MV E/A ratio:  0.79 Buford Dresser MD Electronically signed by Buford Dresser MD Signature Date/Time: 08/17/2020/10:12:58 PM    Final    Scheduled Meds: . Chlorhexidine Gluconate Cloth  6 each Topical Daily  . heparin injection (subcutaneous)  5,000 Units Subcutaneous Q8H  . pantoprazole (PROTONIX) IV  40 mg Intravenous Q12H  . tamsulosin  0.4 mg Oral Daily   Continuous Infusions: . sodium chloride 75 mL/hr at 08/19/20 0420  . cefTRIAXone (ROCEPHIN)  IV 1 g (08/19/20 0848)  . sodium phosphate  Dextrose 5% IVPB 30 mmol (08/19/20 0944)    LOS: 3  days   Kerney Elbe, DO [facility] PAGER is on [place]  If 7PM-7AM, please contact night-coverage [email or web address]

## 2020-08-19 NOTE — Progress Notes (Signed)
NG tube clamped. Pt given broth and popsicle. Pt tolerating well. No complaints of nausea this time.  Will continue to monitor.

## 2020-08-20 ENCOUNTER — Inpatient Hospital Stay (HOSPITAL_COMMUNITY): Payer: Medicaid Other

## 2020-08-20 DIAGNOSIS — K56609 Unspecified intestinal obstruction, unspecified as to partial versus complete obstruction: Secondary | ICD-10-CM | POA: Diagnosis not present

## 2020-08-20 DIAGNOSIS — N200 Calculus of kidney: Secondary | ICD-10-CM | POA: Diagnosis not present

## 2020-08-20 DIAGNOSIS — N179 Acute kidney failure, unspecified: Secondary | ICD-10-CM | POA: Diagnosis not present

## 2020-08-20 LAB — COMPREHENSIVE METABOLIC PANEL
ALT: 8 U/L (ref 0–44)
AST: 13 U/L — ABNORMAL LOW (ref 15–41)
Albumin: 2.5 g/dL — ABNORMAL LOW (ref 3.5–5.0)
Alkaline Phosphatase: 56 U/L (ref 38–126)
Anion gap: 8 (ref 5–15)
BUN: 16 mg/dL (ref 8–23)
CO2: 21 mmol/L — ABNORMAL LOW (ref 22–32)
Calcium: 8.4 mg/dL — ABNORMAL LOW (ref 8.9–10.3)
Chloride: 107 mmol/L (ref 98–111)
Creatinine, Ser: 0.79 mg/dL (ref 0.61–1.24)
GFR, Estimated: >60 mL/min (ref >60)
Glucose, Bld: 111 mg/dL — ABNORMAL HIGH (ref 70–99)
Potassium: 3.9 mmol/L (ref 3.5–5.1)
Sodium: 136 mmol/L (ref 135–145)
Total Bilirubin: 0.4 mg/dL (ref 0.3–1.2)
Total Protein: 6 g/dL — ABNORMAL LOW (ref 6.5–8.1)

## 2020-08-20 LAB — CBC WITH DIFFERENTIAL/PLATELET
Abs Immature Granulocytes: 0 10*3/uL (ref 0.00–0.07)
Band Neutrophils: 0 %
Basophils Absolute: 0 10*3/uL (ref 0.0–0.1)
Basophils Relative: 0 %
Blasts: 0 %
Eosinophils Absolute: 2.1 10*3/uL — ABNORMAL HIGH (ref 0.0–0.5)
Eosinophils Relative: 18 %
HCT: 37.1 % — ABNORMAL LOW (ref 39.0–52.0)
Hemoglobin: 11.5 g/dL — ABNORMAL LOW (ref 13.0–17.0)
Lymphocytes Relative: 10 %
Lymphs Abs: 1.1 10*3/uL (ref 0.7–4.0)
MCH: 29.7 pg (ref 26.0–34.0)
MCHC: 31 g/dL (ref 30.0–36.0)
MCV: 95.9 fL (ref 80.0–100.0)
Metamyelocytes Relative: 0 %
Monocytes Absolute: 0.9 10*3/uL (ref 0.1–1.0)
Monocytes Relative: 8 %
Myelocytes: 0 %
Neutro Abs: 7.3 10*3/uL (ref 1.7–7.7)
Neutrophils Relative %: 64 %
Other: 0 %
Platelets: 206 10*3/uL (ref 150–400)
Promyelocytes Relative: 0 %
RBC: 3.87 MIL/uL — ABNORMAL LOW (ref 4.22–5.81)
RDW: 13.2 % (ref 11.5–15.5)
WBC: 11.4 10*3/uL — ABNORMAL HIGH (ref 4.0–10.5)
nRBC: 0 % (ref 0.0–0.2)
nRBC: 0 /100 WBC

## 2020-08-20 LAB — PHOSPHORUS: Phosphorus: 1.1 mg/dL — ABNORMAL LOW (ref 2.5–4.6)

## 2020-08-20 LAB — MAGNESIUM: Magnesium: 1.7 mg/dL (ref 1.7–2.4)

## 2020-08-20 LAB — HEMOGLOBIN A1C
Hgb A1c MFr Bld: 5.7 % — ABNORMAL HIGH (ref 4.8–5.6)
Mean Plasma Glucose: 117 mg/dL

## 2020-08-20 MED ORDER — POTASSIUM PHOSPHATES 15 MMOLE/5ML IV SOLN
30.0000 mmol | Freq: Once | INTRAVENOUS | Status: AC
  Administered 2020-08-20: 30 mmol via INTRAVENOUS
  Filled 2020-08-20: qty 10

## 2020-08-20 MED ORDER — MAGNESIUM SULFATE 2 GM/50ML IV SOLN
2.0000 g | Freq: Once | INTRAVENOUS | Status: AC
  Administered 2020-08-20: 2 g via INTRAVENOUS
  Filled 2020-08-20: qty 50

## 2020-08-20 NOTE — Evaluation (Signed)
Physical Therapy Evaluation Patient Details Name: Jamie Wilson MRN: [id number] DOB: 16-Apr-1956 Today's Date: 08/20/2020   History of Present Illness  64 yo male admitted with SBO.  Clinical Impression  On eval, pt was Min guard assist for mobility. He walked ~350 feet around the unit with support of IV pole. Pt tolerated activity well. Anticipate he will progress well. Recommend daily ambulation with nursing supervision.     Follow Up Recommendations No PT follow up    Equipment Recommendations  None recommended by PT    Recommendations for Other Services       Precautions / Restrictions Precautions Precautions: Fall Restrictions Weight Bearing Restrictions: No      Mobility  Bed Mobility Overal bed mobility: Modified Independent                  Transfers Overall transfer level: Needs assistance   Transfers: Sit to/from Stand Sit to Stand: Supervision            Ambulation/Gait Ambulation/Gait assistance: Min guard Gait Distance (Feet): 350 Feet Assistive device: IV Pole Gait Pattern/deviations: Step-through pattern;Decreased stride length     General Gait Details: Pt tolerated distance well. He denied lightheadedness/dizziness.  Stairs            Wheelchair Mobility    Modified Rankin (Stroke Patients Only)       Balance Overall balance assessment: Needs assistance         Standing balance support: Single extremity supported Standing balance-Leahy Scale: Fair                               Pertinent Vitals/Pain Pain Assessment: No/denies pain    Home Living                   Additional Comments: Pt reported he lives in a boarding house    Prior Function Level of Independence: Independent               Hand Dominance        Extremity/Trunk Assessment   Upper Extremity Assessment Upper Extremity Assessment: Overall WFL for tasks assessed    Lower Extremity Assessment Lower Extremity  Assessment: Generalized weakness    Cervical / Trunk Assessment Cervical / Trunk Assessment: Normal  Communication   Communication: No difficulties  Cognition Arousal/Alertness: Awake/alert Behavior During Therapy: WFL for tasks assessed/performed Overall Cognitive Status: Within Functional Limits for tasks assessed                                        General Comments      Exercises     Assessment/Plan    PT Assessment Patient needs continued PT services  PT Problem List Decreased strength;Decreased mobility;Decreased activity tolerance;Decreased balance;Decreased knowledge of use of DME       PT Treatment Interventions DME instruction;Gait training;Therapeutic activities;Therapeutic exercise;Patient/family education;Balance training;Functional mobility training    PT Goals (Current goals can be found in the Care Plan section)  Acute Rehab PT Goals PT Goal Formulation: With patient Time For Goal Achievement: 09/03/20 Potential to Achieve Goals: Good    Frequency Min 3X/week   Barriers to discharge        Co-evaluation               AM-PAC PT "6 Clicks" Mobility  Outcome Measure Help needed turning from  your back to your side while in a flat bed without using bedrails?: None Help needed moving from lying on your back to sitting on the side of a flat bed without using bedrails?: None Help needed moving to and from a bed to a chair (including a wheelchair)?: A Little Help needed standing up from a chair using your arms (e.g., wheelchair or bedside chair)?: A Little Help needed to walk in hospital room?: A Little Help needed climbing 3-5 steps with a railing? : A Little 6 Click Score: 20    End of Session   Activity Tolerance: Patient tolerated treatment well Patient left: with call bell/phone within reach;with bed alarm set   PT Visit Diagnosis: Unsteadiness on feet (R26.81)    Time: 6222-9798 PT Time Calculation (min) (ACUTE ONLY):  28 min   Charges:   PT Evaluation $PT Eval Low Complexity: 1 Low PT Treatments $Gait Training: 8-22 mins           Doreatha Massed, PT Acute Rehabilitation  Office: [phone] Pager: [phone]

## 2020-08-20 NOTE — Progress Notes (Signed)
Progress Note: General Surgery Service   Chief Complaint/Subjective: No nausea, lots of gas in bag, no stool yet  Objective: Vital signs in last 24 hours: Temp:  [97.6 F (36.4 C)-98.2 F (36.8 C)] 98.2 F (36.8 C) (10/23 0452) Pulse Rate:  [71-86] 86 (10/23 0452) Resp:  [16-20] 20 (10/23 0452) BP: (110-115)/(69-78) 112/78 (10/23 0452) SpO2:  [95 %-96 %] 95 % (10/23 0452) Weight:  [59.5 kg] 59.5 kg (10/23 0458)    Intake/Output from previous day: 10/22 0701 - 10/23 0700 In: 996.8 [I.V.:608.2; IV Piggyback:388.6] Out: 2650 [Urine:1850; Emesis/NG output:800] Intake/Output this shift: No intake/output data recorded.  Gen: NAD  Resp: nonlabored  Card: RRR  Abd: soft, ostomy pink   Lab Results: CBC  Recent Labs    08/19/20 0359 08/20/20 0519  WBC 11.0* 11.4*  HGB 12.4* 11.5*  HCT 39.0 37.1*  PLT 249 206   BMET Recent Labs    08/19/20 0359 08/20/20 0519  NA 142 136  K 3.7 3.9  CL 109 107  CO2 24 21*  GLUCOSE 101* 111*  BUN 24* 16  CREATININE 0.83 0.79  CALCIUM 8.8* 8.4*   PT/INR No results for input(s): LABPROT, INR in the last 72 hours. ABG No results for input(s): PHART, HCO3 in the last 72 hours.  Invalid input(s): PCO2, PO2  Anti-infectives: Anti-infectives (From admission, onward)   Start     Dose/Rate Route Frequency Ordered Stop   08/17/20 0930  cefTRIAXone (ROCEPHIN) 1 g in sodium chloride 0.9 % 100 mL IVPB        1 g 200 mL/hr over 30 Minutes Intravenous Every 24 hours 08/17/20 0856        Medications: Scheduled Meds: . Chlorhexidine Gluconate Cloth  6 each Topical Daily  . heparin injection (subcutaneous)  5,000 Units Subcutaneous Q8H  . pantoprazole (PROTONIX) IV  40 mg Intravenous Q12H  . tamsulosin  0.4 mg Oral Daily   Continuous Infusions: . sodium chloride 75 mL/hr at 08/20/20 0312  . cefTRIAXone (ROCEPHIN)  IV 1 g (08/19/20 0848)   PRN Meds:.HYDROmorphone (DILAUDID) injection, ondansetron **OR** ondansetron (ZOFRAN) IV,  opium-belladonna  Assessment/Plan: SBO related to ostomy - tolerating clears with tube clamped.  Lots of air in bag.  Will d/c NG and cont clears  -IV PPI due to concern for NG induced gastritis with blood  Hypophosphatemia - lower despite 91mmol given yesterday  ID - rocephin 10/20>> FEN - IVF, NPO/NGT to LIWS VTE - SCDs, ok for chemical DVT prophylaxis from surgical standpint Foley - in place Follow up - TBD   LOS: 4 days   Rosario Adie, MD [phone] [facility]

## 2020-08-20 NOTE — Progress Notes (Signed)
PROGRESS NOTE    Jamie Wilson  LGX:[id number] DOB: Mar 20, 1956 DOA: 08/16/2020 PCP: Patient, [name]   Brief Narrative:  HPI per Dr. Zada Wilson on 08/16/20 Jamie Wilson is a 64 y.o. 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  Patient states he has a history of bowel obstruction for which partial resection was performed in January 2018 with subsequent colostomy placement.  He says he was in prison at the time of the surgery.  He has not had routine surgical follow-up since then.  He has had several days of worsening abdominal pain with nausea and vomiting.  He has had decreased output from his colostomy.  He had worsening emesis today and came to the ED for further evaluation.  He says while in the waiting area he vomited 4 more times.  He says he has a history of kidney stones and has been having bilateral flank pain.  He says he has to self cath for urine output and does have some discomfort when he urinates.  He also reports significant heartburn sensation and chest discomfort recently.  He says on arrival to the ED he was having significant trouble breathing which has now resolved since NG tube was placed.  He says he currently lives in a boardinghouse.  He is not in contact with close family.  He reports smoking about 5 cigars per day. He reports occasional alcohol use about once a week.  He denies any history of alcohol withdrawal.  He reports occasional marijuana use and denies any cocaine or IV drug use.  He says he is not taking any medications regularly.  He is not on any calcium or vitamin supplements.  ED Course:  Initial vitals showed BP 142/84, pulse 125, RR 30, temp 97.7 Fahrenheit, SPO2 92% on room air.  Labs significant for calcium 14.5, BUN 31, creatinine 1.48, sodium 144, potassium 4.0, bicarb 29, serum glucose 143, albumin 3.8,  LFTs within normal limits, WBC 16.7, hemoglobin 15.6, platelets 348,000, lactic acid 1.4, lipase 43, high-sensitivity troponin I 5 > 4.  FOBT is negative.  Blood cultures were obtained and pending.  SARS-CoV-2 PCR is negative.  Influenza A/B PCR are negative.  Portable chest x-ray showed right base atelectasis.  CT abdomen/pelvis with contrast shows small bowel herniation through the left lower quadrant ostomy defect causing high-grade small bowel obstruction.  Right nephrolithiasis without hydronephrosis seen as well as layering high density material in the posterior urinary bladder.  NG tube was placed in follow-up imaging showed enteric tube tip in the left upper quadrant consistent with location in the upper stomach.  EDP discussed with on-call general surgery team will evaluate in the morning.  Medical admission was requested and the hospitalist service was consulted to admit for further evaluation and management.  Patient was given 1 L LR, 4 mg IV Zofran, and 40 mg IV Protonix while in the ED.  **Interim History Patient was admitted for small bowel obstruction and incidentally his EKG showed mildly diffuse ST elevation so cardiology was consulted and felt that patient possibly has pericarditis.  General surgery recommending continuing the NG tube in place and will continue n.p.o. and supportive care for now.  NG tube is connected to suction and patient is less distended today but still complaining of abdominal soreness.  08/18/20: KUB showing some improvement however patient did not have any output from his ostomy yet  still.  Complaining of burning and discomfort in her urine and feels like his bladder spasming cardiology consulted for his diffuse ST elevations and echo showed an EF of 60 to 65% with normal diastolic function.  Cardiology recommending outpatient ischemic evaluation with NST versus coronary CTA if his heart rate improves and they will add aspirin and statin when tolerating  p.o..  08/19/20: Patient still has not had any output from his ostomy bag and urine culture is growing E. coli which is sensitive to ceftriaxone. Foley catheter will remain in place. NG tube in place and still putting out and KUB showing a nonobstructive bowel gas pattern and air throughout the colon that is unchanged. Because he has not had any improvement in the last 48 hours, general surgery considering a laparoscopic evaluation this afternoon to ensure that there is no mechanical issue noted. General surgery also added IV PPI due to concern for NG induced gastritis  08/20/2020: Patient did not actually go for his laparoscopic procedure as he started improving and started having gas in his colostomy bag.  This morning his NG tube is being clamped and planning on being removed by general surgery.  His diet was advanced to clears and is tolerating it with the tube clamped.  They are going to DC his NG tube today and continue clears and recommending continue IV PPI given his NG tube gastritis.  Patient continues to have significant air but not any stool in his colostomy bag today.   Assessment & Plan:   Principal Problem:   Small bowel obstruction (HCC) Active Problems:   Hypercalcemia   AKI (acute kidney injury) ([place])   Right nephrolithiasis  Small bowel obstruction secondary to herniation through ostomy: -General surgery consulted and patient now has an NG tube and hernia appears to be reduced. -NG tube in place, continue low intermittent suction for now and general surgery to reassess the tube later today -CT Abdomen showed "Small bowel herniation through the left lower quadrant the ostomy defect causing high-grade small bowel obstruction."  -Monitor strict I/O's -Keep n.p.o. and continue bowel rest; now has some ice chips -Repeat KUB in a.m. again; KUB this AM showed "Nonobstructive bowel gas pattern. Air throughout the colon unchanged." -WBC Went from 16.7 -> 12.5 -> and normalized to 8.5  yesterday but is now slightly worse at 11.0 yesterday and is 11.4 today -Antiemetics and analgesics as needed; continue with p.o./IV ondansetron 4 mg every 6 hours as needed nausea as well as 0.5 mg IV hydromorphone every 4 hours as needed severe pain -Continue with supportive care mobilize the patient.  Ensure potassium is greater than 4 and magnesium is greater than 2; patient's magnesium was 2.0 but his potassium was 3.7 so this was replete with IV KCl 30 mEq again plus the IV K-Phos 30 mmol as below -General surgery recommending conservative measures but since the patient has failed to progress in the last 48 hours and because he is putting out a lot of fluid and had 1700 mL out from his NG with dark red tinge;General surgery is going to evaluate for laparoscopic this afternoon to ensure there is no mechanical issue since he now has multiple issues that can be causing ileus -He did not actually undergo a laparoscopic procedure yesterday given the started to improve.  Diet was advanced to clear liquids and today is tolerating this and general surgery clamping NG tube and are planning on removing it today. -We will obtain PT OT evaluation to have the patient  ambulate  NG induced Gastritis -There is Concern given that he had dark red-tinged output from his NG yesterday morning  -General surgery is adding IV PPI Pantoprazole 40 mg q12h -He continues concurrently with some heartburn -We will continue to monitor carefully  Hypercalcemia -Corrected calcium 14.7 on admission.  Last calcium 9.4 11/21/2019. -Start IV fluid hydration with NS@ 150 mL/hour and will continue for 16 hours and now that has stopped and IVF resumed at 75 mL/hr given that he is still npo -Started IV calcitonin 4 units/kg sq twice daily for max 4 doses and now stopped  -Give IV zoledronic acid 4 mg once now -PTH intact was 8, and Calicum Total (PTH) was 11.8 -Strict urine output, bladder scan and in and out cath as needed -Repeat  Labs improving and Ca2+ is now 8.4 and corrected for Albumin of 2.5 the Ca2+ is 9.6 -C/w IVF Hydration with normal saline at 75 MLS per hour  Metabolic Acidosis -Mild. Patient's CO2 is now 21, chloride level is 107, anion gap of 8 -Continue IV fluid hydration with normal saline at 75 mils per hour -Continue monitor and trend carefully and repeat CMP in a.m.  Acute Kidney Injury, improving  -Likely prerenal from SBO and GI losses.   -Patient's BUN/Cr went from 31/1.48 -> 27/1.22 -> 24/1.11 -> 24/0.83 and today it is 16/0.79 -He was given 1 L lactated Ringer's bolus and started on IV fluid hydration with normal saline at 150 MLS per hour for 16 hours and this expired so will renew IVF with NS at 75 mL/hr and will continue for now and discontinue when he is tolerating more of a diet -Continue IV fluid hydration as above and repeat labs in a.m. -Avoid nephrotoxic medications, contrast dyes, hypotension and renally dose medications -Continue monitor and trend renal function and repeat CMP in a.m.  Right Nephrolithiasis: -Seen on CT imaging without hydronephrosis.   -Small layering stones noted in the urinary bladder as well as abnormal calcifications within the lumen of the bladder, may be along the thickened bladder wall per radiology report. -Continue IV fluids and analgesics as above -Will add Tamsulosin 0.4 mg Daily -Also will add B&O Suppositories  -U/A as below; now having some sediment in his Foley catheter  BPH -Will place Foley Catheter as patient Self Cath's -Discussed with Urology Dr. Diona Fanti and recommended Foley -Will add Tamsulosin as above and will continue now that he is on a clear liquid diet  E Coli UTI, poA -In the setting of Self Cath and SBO -WBC on admission was 16.7 and trended down to 12.5 and could be in the setting of dehydration, however urinalysis showed a cloudy appearance with moderate hemoglobin, 5 ketones, moderate leukocytes, positive nitrites, 100  protein, greater than 1.046 specific gravity, few bacteria, greater than 50 RBCs per high-power field, and greater than 50 WBCs -Urine culture still pending sensitivities but show >100,000 CFU of GNR x2; Susceptibilities showed E Coli with sensitivities to cefazolin, ceftriaxone, gentamicin, imipenem, and nitrofurantoin -Will empirically start antibiotics with IV Ceftriaxone 1 g every 24 hours and escalate/de-escalate as needed -CTA showed "Right nephrolithiasis. No hydronephrosis. Layering high-density material posteriorly in the urinary bladder, likely small layering stones. Markedly thickened bladder wall abnormal calcifications seen within the lumen of the bladder, difficult to determine exact source and location. These could be along the thickened bladder wall. Aortic atherosclerosis. Hepatic and renal cysts."  Abnormal EKG with Concern for Pericarditis -Patient had an abnormal EKG on Admission with mild ST Elevation  which was diffuse -Cardiology consulted and reviewed EKG and felt it not to be a STEMI but felt it was possible Pericarditis -High-sensitivity troponins were done and negative x2 as initial 1 was 5 and repeat was 4 -Cardiology recommending repeating EKG in the morning and possibly an echo for signs of pericardial effusion -Currently patient remains n.p.o. and is on bowel rest today have not initiated colchicine or NSAIDs for now -ECHO done and normal -Cardiology checked lipid panel showed a total cholesterol/HDL ratio of 2.7, cholesterol level of 105, HDL 39, LDL 51, triglycerides of 74, VLDL 15 -Cardiology planning on NST as an outpatient or Coronary CTA in the outpatient and will add ASA and Statin when taking po  -Cardiology recommending no NSAIDs or colchicine at this time given the patient is relatively asymptomatic and there have been to arrange outpatient cardiology follow-up for exertional chest pain with plans for stress testing on a nonemergent basis once he is clinically  improved  Hyperglycemia in the setting of Prediabetes -Patient's blood sugar on admission was 143 and repeat this morning was 101 -Check hemoglobin A1c to rule out diabetes and was 5.7 -Continue to monitor blood sugars carefully and if necessary will place on sensitive sliding scale insulin AC  Hypophosphatemia -Patient's Phos Level this AM was 1.1 -Replete with IV K Phos 30 mmol again as will be his third day in a row -Continue to Monitor and Replete as Necessary -Repeat Phos Level in the AM   Normocytic Anemia -? Dilutional Drop -Patient's Hgb/Hct went from 15.6/49.5 (Likely hemoconcentrated on admission) and trended down to 13.9/43.9 -> 12.2/39.6 -> 12.4/39.0 -> 11.5/37.1 and is likely from a dilutional drop given that his fluids have been renewed -Check Anemia Panel and showed an iron level of 25, U IBC of 222, TIBC of 247, saturation ratios of 10%, ferritin level 156, folate level 6.3, vitamin B12 118 -We will not give IV iron supplementation given his urinary tract infection -Continue to Monitor for S/Sx of Bleeding; Currently no overt bleeding noted -Repeat CBC in the AM  Sinus Tachycardia, improving -Likely physiological response in relation to his small bowel obstruction and likely UTI as well as flank pain from nephrolithiasis -Cardiology recommending to continue to monitor on telemetry for now    DVT prophylaxis: SCDs; will add Heparin 5,000 units sq q8h Code Status: FULL CODE Family Communication: None Disposition Plan: Pending further clinical improvement back to baseline and tolerance of p.o. diet and clearance by cardiology and general surgery  Status is: Inpatient  Remains inpatient appropriate because:Unsafe d/c plan, IV treatments appropriate due to intensity of illness or inability to take PO and Inpatient level of care appropriate due to severity of illness   Dispo: The patient is from: Home              Anticipated d/c is to: Home              Anticipated  d/c date is: 1-2 days when he is able to tolerate p.o.              Patient currently is not medically stable to d/c.  Consultants:   General Surgery  Cardiology  Discussed Case with Urology   Procedures: NGT insertion; possible Laparoscopy   Antimicrobials:  Anti-infectives (From admission, onward)   Start     Dose/Rate Route Frequency Ordered Stop   08/17/20 0930  cefTRIAXone (ROCEPHIN) 1 g in sodium chloride 0.9 % 100 mL IVPB  1 g 200 mL/hr over 30 Minutes Intravenous Every 24 hours 08/17/20 0856         Subjective: Seen and examined at bedside and he is having a lot of air in his ostomy bag today.  Still complaining of some burning in his chest.  Did have some mild nausea.  NG tube was clamped.  NG tube is going to be discontinued by general surgery and is tolerating his clear liquid diet and tolerated as broth without issues.  Did not actually go for surgical laparoscopic intervention yesterday and feels that he is more sediment in his Foley in hopes that his renal stone can be passed as he is feels that in and out cathing does not do anything for him.  No other concerns or complaints at this time.  Objective: Vitals:   08/19/20 1413 08/19/20 2135 08/20/20 0452 08/20/20 0458  BP: 110/69 115/74 112/78   Pulse: 75 71 86   Resp: 16 16 20    Temp: 97.6 F (36.4 C) 98 F (36.7 C) 98.2 F (36.8 C)   TempSrc: Oral Oral Oral   SpO2: 95% 96% 95%   Weight:    59.5 kg  Height:        Intake/Output Summary (Last 24 hours) at 08/20/2020 1128 Last data filed at 08/19/2020 2158 Gross per 24 hour  Intake 996.82 ml  Output 750 ml  Net 246.82 ml   Filed Weights   08/16/20 2006 08/17/20 0147 08/20/20 0458  Weight: 68 kg 68 kg 59.5 kg   Examination: Physical Exam:  Constitutional: Thin slightly disheveled Caucasian male currently in no acute distress appears calm and will bit more comfortable today than he did yesterday but is complaining of some burning in his  chest Eyes: Lids and conjunctivae normal, sclerae anicteric  ENMT: External Ears, Nose appear normal. Grossly normal hearing.  NG tube is in place in the left nare Neck: Appears normal, supple, no cervical masses, normal ROM, no appreciable thyromegaly; no JVD Respiratory: Diminished to auscultation bilaterally, no wheezing, rales, rhonchi or crackles. Normal respiratory effort and patient is not tachypenic. No accessory muscle use.  Cardiovascular: RRR, no murmurs / rubs / gallops. S1 and S2 auscultated.  No appreciable extremity edema Abdomen: Soft, minimally-tender, slightly distended.  Air in his colostomy bag today but no stool.  Bowel sounds positive but are diminished.  GU: Deferred.  Foley placed with dark color urine and some sediment in his Foley tube Musculoskeletal: No clubbing / cyanosis of digits/nails. No joint deformity upper and lower extremities.  Skin: No rashes, lesions, ulcers on limited skin evaluation. No induration; Warm and dry.  Neurologic: CN 2-12 grossly intact with no focal deficits. Romberg sign and cerebellar reflexes not assessed.  Psychiatric: Normal judgment and insight. Alert and oriented x 3. Normal mood and appropriate affect.    Data Reviewed: I have personally reviewed following labs and imaging studies  CBC: Recent Labs  Lab 08/16/20 1955 08/17/20 0430 08/18/20 0357 08/19/20 0359 08/20/20 0519  WBC 16.7* 12.5* 8.5 11.0* 11.4*  NEUTROABS 14.6*  --  6.3 7.5 7.3  HGB 15.6 13.9 12.2* 12.4* 11.5*  HCT 49.5 43.9 39.6 39.0 37.1*  MCV 93.8 93.2 95.4 94.7 95.9  PLT 348 298 252 249 546   Basic Metabolic Panel: Recent Labs  Lab 08/16/20 1955 08/17/20 0430 08/18/20 0357 08/19/20 0359 08/20/20 0519  NA 144 140 144 142 136  K 4.0 3.9 3.6 3.7 3.9  CL 104 104 109 109 107  CO2 29  27 25 24  21*  GLUCOSE 143* 129* 94 101* 111*  BUN 31* 27* 24* 24* 16  CREATININE 1.48* 1.22 1.11 0.83 0.79  CALCIUM 14.5* 11.9*   11.8* 9.5 8.8* 8.4*  MG  --   --  2.0 2.0  1.7  PHOS  --   --  1.7* 1.2* 1.1*   GFR: Estimated Creatinine Clearance: 77.5 mL/min (by C-G formula based on SCr of 0.79 mg/dL). Liver Function Tests: Recent Labs  Lab 08/16/20 1955 08/17/20 0430 08/18/20 0357 08/19/20 0359 08/20/20 0519  AST 19 14* 12* 12* 13*  ALT 15 12 10 11 8   ALKPHOS 93 75 62 60 56  BILITOT 0.5 0.6 0.6 0.6 0.4  PROT 8.7* 7.2 6.4* 6.7 6.0*  ALBUMIN 3.8 3.1* 2.7* 2.8* 2.5*   Recent Labs  Lab 08/16/20 1955  LIPASE 43   No results for input(s): AMMONIA in the last 168 hours. Coagulation Profile: Recent Labs  Lab 08/16/20 1955  INR 1.1   Cardiac Enzymes: No results for input(s): CKTOTAL, CKMB, CKMBINDEX, TROPONINI in the last 168 hours. BNP (last 3 results) No results for input(s): PROBNP in the last 8760 hours. HbA1C: Recent Labs    08/18/20 0357 08/19/20 0359  HGBA1C 5.7* 5.7*   CBG: No results for input(s): GLUCAP in the last 168 hours. Lipid Profile: Recent Labs    08/18/20 0357  CHOL 105  HDL 39*  LDLCALC 51  TRIG 74  CHOLHDL 2.7   Thyroid Function Tests: No results for input(s): TSH, T4TOTAL, FREET4, T3FREE, THYROIDAB in the last 72 hours. Anemia Panel: Recent Labs    08/19/20 0359  VITAMINB12 118*  FOLATE 6.3  FERRITIN 156  TIBC 247*  IRON 25*  RETICCTPCT 0.9   Sepsis Labs: Recent Labs  Lab 08/16/20 1955  LATICACIDVEN 1.4    Recent Results (from the past 240 hour(s))  Blood Culture (routine x 2)     Status: None (Preliminary result)   Collection Time: 08/16/20  7:55 PM   Specimen: BLOOD  Result Value Ref Range Status   Specimen Description   Final    BLOOD RIGHT ANTECUBITAL Performed at [facility] [address] [place], [place] [address]    Special Requests   Final    BOTTLES DRAWN AEROBIC AND ANAEROBIC Blood Culture adequate volume Performed at [facility] [address] [place], [place] [address]    Culture   Final    NO GROWTH 4 DAYS Performed at [facility], [place] [address], [place], [place] [address]    Report Status PENDING  Incomplete  Blood Culture (routine x 2)     Status: None (Preliminary result)   Collection Time: 08/16/20  8:00 PM   Specimen: BLOOD  Result Value Ref Range Status   Specimen Description   Final    BLOOD LEFT ANTECUBITAL Performed at [facility] [address] [place], [place] [address]    Special Requests   Final    BOTTLES DRAWN AEROBIC AND ANAEROBIC Blood Culture adequate volume Performed at [facility] [address], [place], [place] [address]    Culture   Final    NO GROWTH 4 DAYS Performed at [facility], [place] [address], [place], [place] [address]    Report Status PENDING  Incomplete  Respiratory Panel by RT PCR (Flu A&B, Covid) - Nasopharyngeal Swab     Status: None   Collection Time: 08/16/20  8:15 PM  Specimen: Nasopharyngeal Swab  Result Value Ref Range Status   SARS Coronavirus 2 by RT PCR NEGATIVE NEGATIVE Final    Comment: (NOTE) SARS-CoV-2 target nucleic acids are NOT DETECTED.  The SARS-CoV-2 RNA is generally detectable in upper respiratoy specimens during the acute phase of infection. The lowest concentration of SARS-CoV-2 viral copies this assay can detect is 131 copies/mL. A negative result does not preclude SARS-Cov-2 infection and should not be used as the sole basis for treatment or other patient management decisions. A negative result may occur with  improper specimen collection/handling, submission of specimen other than nasopharyngeal swab, presence of viral mutation(s) within the areas targeted by this assay, and inadequate number of viral copies (<131 copies/mL). A negative result must be combined with clinical observations, patient history, and epidemiological information. The expected result is Negative.  Fact Sheet for Patients:  PinkCheek.be  Fact Sheet for Healthcare Providers:   GravelBags.it  This test is no t yet approved or cleared by the [place] FDA and  has been authorized for detection and/or diagnosis of SARS-CoV-2 by FDA under an Emergency Use Authorization (EUA). This EUA will remain  in effect (meaning this test can be used) for the duration of the COVID-19 declaration under Section 564(b)(1) of the Act, 21 U.S.C. section 360bbb-3(b)(1), unless the authorization is terminated or revoked sooner.     Influenza A by PCR NEGATIVE NEGATIVE Final   Influenza B by PCR NEGATIVE NEGATIVE Final    Comment: (NOTE) The Xpert Xpress SARS-CoV-2/FLU/RSV assay is intended as an aid in  the diagnosis of influenza from Nasopharyngeal swab specimens and  should not be used as a sole basis for treatment. Nasal washings and  aspirates are unacceptable for Xpert Xpress SARS-CoV-2/FLU/RSV  testing.  Fact Sheet for Patients: PinkCheek.be  Fact Sheet for Healthcare Providers: GravelBags.it  This test is not yet approved or cleared by the [place] FDA and  has been authorized for detection and/or diagnosis of SARS-CoV-2 by  FDA under an Emergency Use Authorization (EUA). This EUA will remain  in effect (meaning this test can be used) for the duration of the  Covid-19 declaration under Section 564(b)(1) of the Act, 21  U.S.C. section 360bbb-3(b)(1), unless the authorization is  terminated or revoked. Performed at [facility], [place] [address], [place], [place] [address]   Urine culture     Status: Abnormal   Collection Time: 08/17/20  2:00 AM   Specimen: In/Out Cath Urine  Result Value Ref Range Status   Specimen Description   Final    IN/OUT CATH URINE Performed at [facility] [address], [place], [place] [address]    Special Requests   Final    NONE Performed at [facility], [place] [address],  [place], [place] [address]    Culture >=100,000 COLONIES/mL ESCHERICHIA COLI (A)  Final   Report Status 08/19/2020 FINAL  Final   Organism ID, Bacteria ESCHERICHIA COLI (A)  Final      Susceptibility   Escherichia coli - MIC*    AMPICILLIN >=32 RESISTANT Resistant     CEFAZOLIN 16 SENSITIVE Sensitive     CEFTRIAXONE <=0.25 SENSITIVE Sensitive     CIPROFLOXACIN >=4 RESISTANT Resistant     GENTAMICIN <=1 SENSITIVE Sensitive     IMIPENEM <=0.25 SENSITIVE Sensitive     NITROFURANTOIN <=16 SENSITIVE Sensitive     TRIMETH/SULFA >=320 RESISTANT Resistant     AMPICILLIN/SULBACTAM >=32 RESISTANT Resistant     PIP/TAZO <=4  SENSITIVE Sensitive     * >=100,000 COLONIES/mL ESCHERICHIA COLI  Culture, Urine     Status: Abnormal   Collection Time: 08/17/20  9:50 AM   Specimen: Urine, Random  Result Value Ref Range Status   Specimen Description   Final    URINE, RANDOM Performed at [facility] [address], [place], [place] [address]    Special Requests   Final    NONE Performed at [facility], [place] [address] [place], [place] [address]    Culture >=100,000 COLONIES/mL ESCHERICHIA COLI (A)  Final   Report Status 08/19/2020 FINAL  Final   Organism ID, Bacteria ESCHERICHIA COLI (A)  Final      Susceptibility   Escherichia coli - MIC*    AMPICILLIN >=32 RESISTANT Resistant     CEFAZOLIN 16 SENSITIVE Sensitive     CEFTRIAXONE <=0.25 SENSITIVE Sensitive     CIPROFLOXACIN >=4 RESISTANT Resistant     GENTAMICIN <=1 SENSITIVE Sensitive     IMIPENEM <=0.25 SENSITIVE Sensitive     NITROFURANTOIN <=16 SENSITIVE Sensitive     TRIMETH/SULFA >=320 RESISTANT Resistant     AMPICILLIN/SULBACTAM >=32 RESISTANT Resistant     PIP/TAZO 64 INTERMEDIATE Intermediate     * >=100,000 COLONIES/mL ESCHERICHIA COLI    RN Pressure Injury Documentation:     Estimated body mass index is 17.31 kg/m as calculated from the following:   Height as of this encounter: 6\' 1"  (1.854  m).   Weight as of this encounter: 59.5 kg.  Malnutrition Type:      Malnutrition Characteristics:      Nutrition Interventions:    Radiology Studies: DG Abd 1 View  Result Date: 08/20/2020 CLINICAL DATA:  Small bowel obstruction EXAM: ABDOMEN - 1 VIEW COMPARISON:  Yesterday FINDINGS: Unchanged gaseous colonic distension above a colostomy. The enteric tube tip is at the stomach with side port near the GE junction. No concerning mass effect or gas collection. IMPRESSION: Unchanged gaseous colonic distension above a colostomy. No gas dilated small bowel. Electronically Signed   By: Monte Fantasia M.D.   On: 08/20/2020 06:53   DG Abd Portable 1V  Result Date: 08/19/2020 CLINICAL DATA:  Distended abdomen EXAM: PORTABLE ABDOMEN - 1 VIEW COMPARISON:  08/18/2020 FINDINGS: Gas in nondilated colon.  Small bowel nondilated. Left colostomy noted. NG tube in the body the stomach. Ventral hernia repair with mesh. IMPRESSION: Nonobstructive bowel gas pattern. Air throughout the colon unchanged. Electronically Signed   By: Franchot Gallo M.D.   On: 08/19/2020 08:18   Scheduled Meds:  Chlorhexidine Gluconate Cloth  6 each Topical Daily   heparin injection (subcutaneous)  5,000 Units Subcutaneous Q8H   pantoprazole (PROTONIX) IV  40 mg Intravenous Q12H   tamsulosin  0.4 mg Oral Daily   Continuous Infusions:  sodium chloride 75 mL/hr at 08/20/20 0312   cefTRIAXone (ROCEPHIN)  IV 1 g (08/19/20 0848)   magnesium sulfate bolus IVPB     potassium PHOSPHATE IVPB (in mmol)      LOS: 4 days   Kerney Elbe, DO [facility] PAGER is on AMION  If 7PM-7AM, please contact night-coverage [email or web address]

## 2020-08-21 ENCOUNTER — Inpatient Hospital Stay (HOSPITAL_COMMUNITY): Payer: Medicaid Other

## 2020-08-21 DIAGNOSIS — N179 Acute kidney failure, unspecified: Secondary | ICD-10-CM | POA: Diagnosis not present

## 2020-08-21 DIAGNOSIS — N200 Calculus of kidney: Secondary | ICD-10-CM | POA: Diagnosis not present

## 2020-08-21 DIAGNOSIS — K56609 Unspecified intestinal obstruction, unspecified as to partial versus complete obstruction: Secondary | ICD-10-CM | POA: Diagnosis not present

## 2020-08-21 LAB — CBC WITH DIFFERENTIAL/PLATELET
Abs Immature Granulocytes: 0.02 10*3/uL (ref 0.00–0.07)
Basophils Absolute: 0.1 10*3/uL (ref 0.0–0.1)
Basophils Relative: 1 %
Eosinophils Absolute: 4.1 10*3/uL — ABNORMAL HIGH (ref 0.0–0.5)
Eosinophils Relative: 36 %
HCT: 36 % — ABNORMAL LOW (ref 39.0–52.0)
Hemoglobin: 11.4 g/dL — ABNORMAL LOW (ref 13.0–17.0)
Immature Granulocytes: 0 %
Lymphocytes Relative: 12 %
Lymphs Abs: 1.3 10*3/uL (ref 0.7–4.0)
MCH: 29.6 pg (ref 26.0–34.0)
MCHC: 31.7 g/dL (ref 30.0–36.0)
MCV: 93.5 fL (ref 80.0–100.0)
Monocytes Absolute: 0.5 10*3/uL (ref 0.1–1.0)
Monocytes Relative: 5 %
Neutro Abs: 5.3 10*3/uL (ref 1.7–7.7)
Neutrophils Relative %: 46 %
Platelets: 219 10*3/uL (ref 150–400)
RBC: 3.85 MIL/uL — ABNORMAL LOW (ref 4.22–5.81)
RDW: 12.9 % (ref 11.5–15.5)
WBC: 11.3 10*3/uL — ABNORMAL HIGH (ref 4.0–10.5)
nRBC: 0 % (ref 0.0–0.2)

## 2020-08-21 LAB — CULTURE, BLOOD (ROUTINE X 2)
Culture: NO GROWTH
Culture: NO GROWTH
Special Requests: ADEQUATE
Special Requests: ADEQUATE

## 2020-08-21 LAB — COMPREHENSIVE METABOLIC PANEL
ALT: 9 U/L (ref 0–44)
AST: 11 U/L — ABNORMAL LOW (ref 15–41)
Albumin: 2.5 g/dL — ABNORMAL LOW (ref 3.5–5.0)
Alkaline Phosphatase: 62 U/L (ref 38–126)
Anion gap: 9 (ref 5–15)
BUN: 9 mg/dL (ref 8–23)
CO2: 20 mmol/L — ABNORMAL LOW (ref 22–32)
Calcium: 7.9 mg/dL — ABNORMAL LOW (ref 8.9–10.3)
Chloride: 105 mmol/L (ref 98–111)
Creatinine, Ser: 0.77 mg/dL (ref 0.61–1.24)
GFR, Estimated: >60 mL/min (ref >60)
Glucose, Bld: 90 mg/dL (ref 70–99)
Potassium: 4.2 mmol/L (ref 3.5–5.1)
Sodium: 134 mmol/L — ABNORMAL LOW (ref 135–145)
Total Bilirubin: 0.4 mg/dL (ref 0.3–1.2)
Total Protein: 6 g/dL — ABNORMAL LOW (ref 6.5–8.1)

## 2020-08-21 LAB — MAGNESIUM: Magnesium: 2.1 mg/dL (ref 1.7–2.4)

## 2020-08-21 LAB — PHOSPHORUS: Phosphorus: 1.2 mg/dL — ABNORMAL LOW (ref 2.5–4.6)

## 2020-08-21 MED ORDER — SODIUM PHOSPHATES 45 MMOLE/15ML IV SOLN
30.0000 mmol | Freq: Once | INTRAVENOUS | Status: AC
  Administered 2020-08-21: 30 mmol via INTRAVENOUS
  Filled 2020-08-21: qty 10

## 2020-08-21 NOTE — Evaluation (Signed)
Occupational Therapy Evaluation Patient Details Name: Jamie Wilson MRN: [id number] DOB: Oct 12, 1956 Today's Date: 08/21/2020    History of Present Illness 64 yo male admitted with SBO.   Clinical Impression   Mr Jamie Wilson demonstrates ability to perform bed mobility, ambulate in hall, don socks and shoes and exhibits physical abilities to perform all aspects of ADLs and mobility. No OT needs at this time.    Follow Up Recommendations  No OT follow up    Equipment Recommendations  None recommended by OT    Recommendations for Other Services       Precautions / Restrictions Precautions Precautions: None Restrictions Weight Bearing Restrictions: No      Mobility Bed Mobility Overal bed mobility: Modified Independent                  Transfers Overall transfer level: Modified independent               General transfer comment: Increased time. Ambulated 200 feet in hall with IV pole. No physical assistance. Gait slow and steady without LOB.    Balance Overall balance assessment: No apparent balance deficits (not formally assessed)                                         ADL either performed or assessed with clinical judgement   ADL Overall ADL's : Modified independent                                       General ADL Comments: Able to perform all ADLs with increased time and also managing IV pole.     Vision Patient Visual Report: No change from baseline       Perception     Praxis      Pertinent Vitals/Pain Pain Assessment: No/denies pain     Hand Dominance     Extremity/Trunk Assessment Upper Extremity Assessment Upper Extremity Assessment: Overall WFL for tasks assessed   Lower Extremity Assessment Lower Extremity Assessment: Defer to PT evaluation   Cervical / Trunk Assessment Cervical / Trunk Assessment: Normal   Communication Communication Communication: No difficulties   Cognition  Arousal/Alertness: Awake/alert Behavior During Therapy: WFL for tasks assessed/performed Overall Cognitive Status: Within Functional Limits for tasks assessed                                     General Comments       Exercises     Shoulder Instructions      Home Living Family/patient expects to be discharged to:: Private residence             Home Layout: One level     Bathroom Shower/Tub: Teacher, early years/pre: Standard     Home Equipment: Radio producer - single point   Additional Comments: Pt reported he lives in a boarding house      Prior Functioning/Environment Level of Independence: Independent        Comments: REports walking a 1/2 mile to the store.        OT Problem List:        OT Treatment/Interventions:      OT Goals(Current goals can be found in the care plan section) Acute Rehab  OT Goals OT Goal Formulation: All assessment and education complete, DC therapy  OT Frequency:     Barriers to D/C:            Co-evaluation              AM-PAC OT "6 Clicks" Daily Activity     Outcome Measure Help from another person eating meals?: None Help from another person taking care of personal grooming?: None Help from another person toileting, which includes using toliet, bedpan, or urinal?: None Help from another person bathing (including washing, rinsing, drying)?: None Help from another person to put on and taking off regular upper body clothing?: None Help from another person to put on and taking off regular lower body clothing?: None 6 Click Score: 24   End of Session Nurse Communication:  (okay to see per RN)  Activity Tolerance: Patient tolerated treatment well Patient left: in bed;with call bell/phone within reach                   Time: 3343-5686 OT Time Calculation (min): 16 min Charges:  OT General Charges $OT Visit: 1 Visit OT Evaluation $OT Eval Low Complexity: 1 Low  Jamie Wilson, OTR/L Coin  Office [phone] Pager: [phone]   Jamie Wilson 08/21/2020, 10:45 AM

## 2020-08-21 NOTE — Progress Notes (Signed)
Progress Note: General Surgery Service   Chief Complaint/Subjective: No nausea, lots of gas in bag, no stool yet  Objective: Vital signs in last 24 hours: Temp:  [98 F (36.7 C)-98.6 F (37 C)] 98.6 F (37 C) (10/24 0607) Pulse Rate:  [69-76] 74 (10/24 0607) Resp:  [18-20] 18 (10/24 0607) BP: (97-118)/(59-73) 107/73 (10/24 0607) SpO2:  [94 %-96 %] 96 % (10/24 0607)    Intake/Output from previous day: 10/23 0701 - 10/24 0700 In: 3778.4 [P.O.:1220; I.V.:1798.4; IV Piggyback:760] Out: 2550 [Urine:2550] Intake/Output this shift: No intake/output data recorded.  Gen: NAD  Resp: nonlabored  Abd: soft, ostomy pink   Lab Results: CBC  Recent Labs    08/20/20 0519 08/21/20 0525  WBC 11.4* 11.3*  HGB 11.5* 11.4*  HCT 37.1* 36.0*  PLT 206 219   BMET Recent Labs    08/20/20 0519 08/21/20 0525  NA 136 134*  K 3.9 4.2  CL 107 105  CO2 21* 20*  GLUCOSE 111* 90  BUN 16 9  CREATININE 0.79 0.77  CALCIUM 8.4* 7.9*   PT/INR No results for input(s): LABPROT, INR in the last 72 hours. ABG No results for input(s): PHART, HCO3 in the last 72 hours.  Invalid input(s): PCO2, PO2  Anti-infectives: Anti-infectives (From admission, onward)   Start     Dose/Rate Route Frequency Ordered Stop   08/17/20 0930  cefTRIAXone (ROCEPHIN) 1 g in sodium chloride 0.9 % 100 mL IVPB        1 g 200 mL/hr over 30 Minutes Intravenous Every 24 hours 08/17/20 0856        Medications: Scheduled Meds: . Chlorhexidine Gluconate Cloth  6 each Topical Daily  . heparin injection (subcutaneous)  5,000 Units Subcutaneous Q8H  . pantoprazole (PROTONIX) IV  40 mg Intravenous Q12H  . tamsulosin  0.4 mg Oral Daily   Continuous Infusions: . sodium chloride 75 mL/hr at 08/20/20 1810  . cefTRIAXone (ROCEPHIN)  IV 1 g (08/20/20 1251)   PRN Meds:.HYDROmorphone (DILAUDID) injection, ondansetron **OR** ondansetron (ZOFRAN) IV, opium-belladonna  Assessment/Plan: SBO related to ostomy - tolerating  clears.  Will advance to [facility]  -IV PPI due to concern for NG induced gastritis with blood  Hypophosphatemia - lower despite 25mmol given yesterday  ID - rocephin 10/20>> FEN - IVF, NPO/NGT to LIWS VTE - SCDs, ok for chemical DVT prophylaxis from surgical standpint Foley - in place Follow up - TBD   LOS: 5 days   Rosario Adie, MD [phone] [facility]

## 2020-08-21 NOTE — Progress Notes (Addendum)
PROGRESS NOTE    [name]  BTD:[id number] DOB: 1956-07-23 DOA: 08/16/2020 PCP: Patient, [name]   Brief Narrative:  HPI per Dr. Zada Finders on 08/16/20 Jamie Wilson is a 64 y.o. 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  Patient states he has a history of bowel obstruction for which partial resection was performed in January 2018 with subsequent colostomy placement.  He says he was in prison at the time of the surgery.  He has not had routine surgical follow-up since then.  He has had several days of worsening abdominal pain with nausea and vomiting.  He has had decreased output from his colostomy.  He had worsening emesis today and came to the ED for further evaluation.  He says while in the waiting area he vomited 4 more times.  He says he has a history of kidney stones and has been having bilateral flank pain.  He says he has to self cath for urine output and does have some discomfort when he urinates.  He also reports significant heartburn sensation and chest discomfort recently.  He says on arrival to the ED he was having significant trouble breathing which has now resolved since NG tube was placed.  He says he currently lives in a boardinghouse.  He is not in contact with close family.  He reports smoking about 5 cigars per day. He reports occasional alcohol use about once a week.  He denies any history of alcohol withdrawal.  He reports occasional marijuana use and denies any cocaine or IV drug use.  He says he is not taking any medications regularly.  He is not on any calcium or vitamin supplements.  ED Course:  Initial vitals showed BP 142/84, pulse 125, RR 30, temp 97.7 Fahrenheit, SPO2 92% on room air.  Labs significant for calcium 14.5, BUN 31, creatinine 1.48, sodium 144, potassium 4.0, bicarb 29, serum glucose 143, albumin 3.8,  LFTs within normal limits, WBC 16.7, hemoglobin 15.6, platelets 348,000, lactic acid 1.4, lipase 43, high-sensitivity troponin I 5 > 4.  FOBT is negative.  Blood cultures were obtained and pending.  SARS-CoV-2 PCR is negative.  Influenza A/B PCR are negative.  Portable chest x-ray showed right base atelectasis.  CT abdomen/pelvis with contrast shows small bowel herniation through the left lower quadrant ostomy defect causing high-grade small bowel obstruction.  Right nephrolithiasis without hydronephrosis seen as well as layering high density material in the posterior urinary bladder.  NG tube was placed in follow-up imaging showed enteric tube tip in the left upper quadrant consistent with location in the upper stomach.  EDP discussed with on-call general surgery team will evaluate in the morning.  Medical admission was requested and the hospitalist service was consulted to admit for further evaluation and management.  Patient was given 1 L LR, 4 mg IV Zofran, and 40 mg IV Protonix while in the ED.  **Interim History Patient was admitted for small bowel obstruction and incidentally his EKG showed mildly diffuse ST elevation so cardiology was consulted and felt that patient possibly has pericarditis.  General surgery recommending continuing the NG tube in place and will continue n.p.o. and supportive care for now.  NG tube is connected to suction and patient is less distended today but still complaining of abdominal soreness.  08/18/20: KUB showing some improvement however patient did not have any output from his ostomy yet  still.  Complaining of burning and discomfort in her urine and feels like his bladder spasming cardiology consulted for his diffuse ST elevations and echo showed an EF of 60 to 65% with normal diastolic function.  Cardiology recommending outpatient ischemic evaluation with NST versus coronary CTA if his heart rate improves and they will add aspirin and statin when tolerating  p.o..  08/19/20: Patient still has not had any output from his ostomy bag and urine culture is growing E. coli which is sensitive to ceftriaxone. Foley catheter will remain in place. NG tube in place and still putting out and KUB showing a nonobstructive bowel gas pattern and air throughout the colon that is unchanged. Because he has not had any improvement in the last 48 hours, general surgery considering a laparoscopic evaluation this afternoon to ensure that there is no mechanical issue noted. General surgery also added IV PPI due to concern for NG induced gastritis  08/20/2020: Patient did not actually go for his laparoscopic procedure as he started improving and started having gas in his colostomy bag.  This morning his NG tube is being clamped and planning on being removed by general surgery.  His diet was advanced to clears and is tolerating it with the tube clamped.  They are going to DC his NG tube today and continue clears and recommending continue IV PPI given his NG tube gastritis.  Patient continues to have significant air but not any stool in his colostomy bag today.  08/21/2020: Patient is tolerating clear liquid diet and advance to full liquid diet today.  He does have some swelling in his ostomy site.  We will continue IV fluid hydration for now and he is ambulating fairly well.  Continues to have air in his colostomy bag with no output of stool yet.  Possible remains significantly low still.   Assessment & Plan:   Principal Problem:   Small bowel obstruction (HCC) Active Problems:   Hypercalcemia   AKI (acute kidney injury) ([place])   Right nephrolithiasis  Small bowel obstruction secondary to herniation through ostomy: -General surgery consulted and patient now has an NG tube and hernia appears to be reduced. -NG tube in place, continue low intermittent suction for now and general surgery to reassess the tube later today -CT Abdomen showed "Small bowel herniation through the left  lower quadrant the ostomy defect causing high-grade small bowel obstruction."  -Monitor strict I/O's -Keep n.p.o. and continue bowel rest; now has some ice chips -Repeat KUB in a.m. again; KUB this AM showed "Distended gas-filled loops of bowel throughout the abdomen and upper pelvis, some component of which corresponds to the small-bowel obstruction demonstrated on earlier CT abdomen of 08/16/2020." -WBC Went from 16.7 -> 12.5 -> and normalized to 8.5 yesterday but is now slightly worse at 11.0 the day before yesterday and is 11.4 yesterday and today it is 11.3 -Antiemetics and analgesics as needed; continue with p.o./IV ondansetron 4 mg every 6 hours as needed nausea as well as 0.5 mg IV hydromorphone every 4 hours as needed severe pain -Continue with supportive care mobilize the patient.  Ensure potassium is greater than 4 and magnesium is greater than 2; mag is now 2.1 and potassium is 4.2 -He did not actually undergo a laparoscopic procedure the day before yesterday given that he started to improve.  Diet was advanced to clear liquids yesterday and his NG tube was clamped and removed -Diet has been further advanced from clear liquids and has been on full liquid  diet -We will obtain PT OT evaluation to have the patient ambulate and they have no further recommendations  NG induced Gastritis -There is Concern given that he had dark red-tinged output from his NG yesterday morning  -General surgery is adding IV PPI Pantoprazole 40 mg q12h -He continues concurrently with some heartburn -We will continue to monitor carefully  Hypercalcemia -Corrected calcium 14.7 on admission.  Last calcium 9.4 11/21/2019. -Start IV fluid hydration with NS@ 150 mL/hour and will continue for 16 hours and now that has stopped and IVF resumed at 75 mL/hr given that he is still npo -Started IV calcitonin 4 units/kg sq twice daily for max 4 doses and now stopped  -Give IV zoledronic acid 4 mg once now -PTH intact was  8, and Calicum Total (PTH) was 11.8 -Strict urine output, bladder scan and in and out cath as needed -Repeat Labs improving and Ca2+ is now 7.9 and corrected for Albumin of 2.5 the Ca2+ is 9.1 -C/w IVF Hydration with normal saline at 75 MLS per hour for today and will discontinue in the morning  Metabolic Acidosis -Mild. Patient's CO2 is now 20, chloride level is 105, anion gap is 9 -Continue IV fluid hydration with normal saline at 75 mils per hour the morning -Continue monitor and trend carefully and repeat CMP in a.m.  Acute Kidney Injury, improving  -Likely prerenal from SBO and GI losses.   -Patient's BUN/Cr went from 31/1.48 -> 27/1.22 -> 24/1.11 -> 24/0.83 -> 16/0.79 -> 9/2.77 -He was given 1 L lactated Ringer's bolus and started on IV fluid hydration with normal saline at 150 MLS per hour for 16 hours and this expired so will renew IVF with NS at 75 mL/hr and will continue for now and discontinue in the morning as likely diet will be advanced -Continue IV fluid hydration as above and repeat labs in a.m. -Avoid nephrotoxic medications, contrast dyes, hypotension and renally dose medications -Continue monitor and trend renal function and repeat CMP in a.m.  Right Nephrolithiasis: -Seen on CT imaging without hydronephrosis.   -Small layering stones noted in the urinary bladder as well as abnormal calcifications within the lumen of the bladder, may be along the thickened bladder wall per radiology report. -Continue IV fluids and analgesics as above -Will add Tamsulosin 0.4 mg Daily -Also will add B&O Suppositories  -U/A as below; now having some sediment in his Foley catheter but feels like it is improving  BPH -Will place Foley Catheter as patient Self Cath's -Discussed with Urology Dr. Diona Fanti and recommended Foley -Will add Tamsulosin as above and will continue now that he is on a clear liquid diet  E Coli UTI, poA -In the setting of Self Cath and SBO -WBC on admission was  16.7 and trended down to 12.5 and could be in the setting of dehydration, however urinalysis showed a cloudy appearance with moderate hemoglobin, 5 ketones, moderate leukocytes, positive nitrites, 100 protein, greater than 1.046 specific gravity, few bacteria, greater than 50 RBCs per high-power field, and greater than 50 WBCs -Urine culture still pending sensitivities but show >100,000 CFU of GNR x2; Susceptibilities showed E Coli with sensitivities to cefazolin, ceftriaxone, gentamicin, imipenem, and nitrofurantoin -Will empirically start antibiotics with IV Ceftriaxone 1 g every 24 hours and escalate/de-escalate as needed -CTA showed "Right nephrolithiasis. No hydronephrosis. Layering high-density material posteriorly in the urinary bladder, likely small layering stones. Markedly thickened bladder wall abnormal calcifications seen within the lumen of the bladder, difficult to determine exact source and  location. These could be along the thickened bladder wall. Aortic atherosclerosis. Hepatic and renal cysts."  Abnormal EKG with Concern for Pericarditis -Patient had an abnormal EKG on Admission with mild ST Elevation which was diffuse -Cardiology consulted and reviewed EKG and felt it not to be a STEMI but felt it was possible Pericarditis -High-sensitivity troponins were done and negative x2 as initial 1 was 5 and repeat was 4 -Cardiology recommending repeating EKG in the morning and possibly an echo for signs of pericardial effusion -Currently patient remains n.p.o. and is on bowel rest today have not initiated colchicine or NSAIDs for now -ECHO done and normal -Cardiology checked lipid panel showed a total cholesterol/HDL ratio of 2.7, cholesterol level of 105, HDL 39, LDL 51, triglycerides of 74, VLDL 15 -Cardiology planning on NST as an outpatient or Coronary CTA in the outpatient and will add ASA and Statin when taking po  -Cardiology recommending no NSAIDs or colchicine at this time given the  patient is relatively asymptomatic and there have been to arrange outpatient cardiology follow-up for exertional chest pain with plans for stress testing on a nonemergent basis once he is clinically improved  Hyperglycemia in the setting of Prediabetes -Patient's blood sugar on admission was 143 and repeat this morning was 90 -Check hemoglobin A1c to rule out diabetes and was 5.7 -Continue to monitor blood sugars carefully and if necessary will place on sensitive sliding scale insulin AC  Hypophosphatemia -Patient's Phos Level this AM was 1.2 -Replete with IV K Phos 30 mmol again yesterday and will replete with IV sodium Phos 30 mmol today -Continue to Monitor and Replete as Necessary -Repeat Phos Level in the AM   Normocytic Anemia -? Dilutional Drop -Patient's Hgb/Hct went from 15.6/49.5 (Likely hemoconcentrated on admission) and trended down to 13.9/43.9 -> 12.2/39.6 -> 12.4/39.0 -> 11.5/37.1 and today it is 11.4/36.0 and is likely from a dilutional drop given that his fluids have been renewed -Check Anemia Panel and showed an iron level of 25, U IBC of 222, TIBC of 247, saturation ratios of 10%, ferritin level 156, folate level 6.3, vitamin B12 118 -We will not give IV iron supplementation given his urinary tract infection -Continue to Monitor for S/Sx of Bleeding; Currently no overt bleeding noted -Repeat CBC in the AM  Sinus Tachycardia, improving -Likely physiological response in relation to his small bowel obstruction and likely UTI as well as flank pain from nephrolithiasis -Cardiology recommending to continue to monitor on telemetry for now    DVT prophylaxis: SCDs; will add Heparin 5,000 units sq q8h Code Status: FULL CODE Family Communication: None Disposition Plan: Pending further clinical improvement back to baseline and tolerance of p.o. diet and clearance by cardiology and general surgery  Status is: Inpatient  Remains inpatient appropriate because:Unsafe d/c plan, IV  treatments appropriate due to intensity of illness or inability to take PO and Inpatient level of care appropriate due to severity of illness   Dispo: The patient is from: Home              Anticipated d/c is to: Home              Anticipated d/c date is: 1-2 days when he is able to tolerate p.o.              Patient currently is not medically stable to d/c.  Consultants:   General Surgery  Cardiology  Discussed Case with Urology   Procedures: NGT insertion; possible Laparoscopy   Antimicrobials:  Anti-infectives (From admission, onward)   Start     Dose/Rate Route Frequency Ordered Stop   08/17/20 0930  cefTRIAXone (ROCEPHIN) 1 g in sodium chloride 0.9 % 100 mL IVPB        1 g 200 mL/hr over 30 Minutes Intravenous Every 24 hours 08/17/20 0856         Subjective: Seen and examined at bedside and he continues to have ear in his ostomy bag but no solid fecal output.  Has some swelling in his ostomy site.  No nausea or vomiting denies any chest pain, lightheadedness or dizziness.  Thinks he is getting better and was ambulating.  Still has some abdominal tenderness.  States Foley is not hurting as bad today.  No other concerns or complaints at this time.  Objective: Vitals:   08/20/20 0458 08/20/20 1306 08/20/20 2047 08/21/20 0607  BP:  (!) 97/59 118/66 107/73  Pulse:  69 76 74  Resp:  20 20 18   Temp:  98.4 F (36.9 C) 98 F (36.7 C) 98.6 F (37 C)  TempSrc:      SpO2:  94% 95% 96%  Weight: 59.5 kg     Height:        Intake/Output Summary (Last 24 hours) at 08/21/2020 1255 Last data filed at 08/21/2020 0406 Gross per 24 hour  Intake 3538.4 ml  Output 2550 ml  Net 988.4 ml   Filed Weights   08/16/20 2006 08/17/20 0147 08/20/20 0458  Weight: 68 kg 68 kg 59.5 kg   Examination: Physical Exam:  Constitutional: The patient is a thin slightly disheveled Caucasian male currently in no acute distress appears calm and will be more comfortable today and feels like he is  improving slowly Eyes: Lids and conjunctivae normal, sclerae anicteric  ENMT: External Ears, Nose appear normal. Grossly normal hearing. Neck: Appears normal, supple, no cervical masses, normal ROM, no appreciable thyromegaly; no JVD Respiratory: Diminished to auscultation bilaterally, no wheezing, rales, rhonchi or crackles. Normal respiratory effort and patient is not tachypenic. No accessory muscle use.  Unlabored breathing Cardiovascular: RRR, no murmurs / rubs / gallops. S1 and S2 auscultated.  No appreciable extremity edema Abdomen: Soft, tender on the right side, distended slightly.  Ostomy present with no fecal output but does have some air in it.  Has some swelling at the ostomy site. GU: Deferred. Musculoskeletal: No clubbing / cyanosis of digits/nails. No joint deformity upper and lower extremities. Skin: No rashes, lesions, ulcers on limited skin evaluation. No induration; Warm and dry.  Neurologic: CN 2-12 grossly intact with no focal deficits. Romberg sign and cerebellar reflexes not assessed.  Psychiatric: Normal judgment and insight. Alert and oriented x 3. Normal mood and appropriate affect.    Data Reviewed: I have personally reviewed following labs and imaging studies  CBC: Recent Labs  Lab 08/16/20 1955 08/16/20 1955 08/17/20 0430 08/18/20 0357 08/19/20 0359 08/20/20 0519 08/21/20 0525  WBC 16.7*   < > 12.5* 8.5 11.0* 11.4* 11.3*  NEUTROABS 14.6*  --   --  6.3 7.5 7.3 5.3  HGB 15.6   < > 13.9 12.2* 12.4* 11.5* 11.4*  HCT 49.5   < > 43.9 39.6 39.0 37.1* 36.0*  MCV 93.8   < > 93.2 95.4 94.7 95.9 93.5  PLT 348   < > 298 252 249 206 219   < > = values in this interval not displayed.   Basic Metabolic Panel: Recent Labs  Lab 08/17/20 0430 08/18/20 0357 08/19/20 0359 08/20/20 0086  08/21/20 0525  NA 140 144 142 136 134*  K 3.9 3.6 3.7 3.9 4.2  CL 104 109 109 107 105  CO2 27 25 24  21* 20*  GLUCOSE 129* 94 101* 111* 90  BUN 27* 24* 24* 16 9  CREATININE 1.22  1.11 0.83 0.79 0.77  CALCIUM 11.9*  11.8* 9.5 8.8* 8.4* 7.9*  MG  --  2.0 2.0 1.7 2.1  PHOS  --  1.7* 1.2* 1.1* 1.2*   GFR: Estimated Creatinine Clearance: 77.5 mL/min (by C-G formula based on SCr of 0.77 mg/dL). Liver Function Tests: Recent Labs  Lab 08/17/20 0430 08/18/20 0357 08/19/20 0359 08/20/20 0519 08/21/20 0525  AST 14* 12* 12* 13* 11*  ALT 12 10 11 8 9   ALKPHOS 75 62 60 56 62  BILITOT 0.6 0.6 0.6 0.4 0.4  PROT 7.2 6.4* 6.7 6.0* 6.0*  ALBUMIN 3.1* 2.7* 2.8* 2.5* 2.5*   Recent Labs  Lab 08/16/20 1955  LIPASE 43   No results for input(s): AMMONIA in the last 168 hours. Coagulation Profile: Recent Labs  Lab 08/16/20 1955  INR 1.1   Cardiac Enzymes: No results for input(s): CKTOTAL, CKMB, CKMBINDEX, TROPONINI in the last 168 hours. BNP (last 3 results) No results for input(s): PROBNP in the last 8760 hours. HbA1C: Recent Labs    08/19/20 0359  HGBA1C 5.7*   CBG: No results for input(s): GLUCAP in the last 168 hours. Lipid Profile: No results for input(s): CHOL, HDL, LDLCALC, TRIG, CHOLHDL, LDLDIRECT in the last 72 hours. Thyroid Function Tests: No results for input(s): TSH, T4TOTAL, FREET4, T3FREE, THYROIDAB in the last 72 hours. Anemia Panel: Recent Labs    08/19/20 0359  VITAMINB12 118*  FOLATE 6.3  FERRITIN 156  TIBC 247*  IRON 25*  RETICCTPCT 0.9   Sepsis Labs: Recent Labs  Lab 08/16/20 1955  LATICACIDVEN 1.4    Recent Results (from the past 240 hour(s))  Blood Culture (routine x 2)     Status: None   Collection Time: 08/16/20  7:55 PM   Specimen: BLOOD  Result Value Ref Range Status   Specimen Description   Final    BLOOD RIGHT ANTECUBITAL Performed at [facility] [address], [place], [place] [address]    Special Requests   Final    BOTTLES DRAWN AEROBIC AND ANAEROBIC Blood Culture adequate volume Performed at [facility] [address] [place], [place] [address]    Culture    Final    NO GROWTH 5 DAYS Performed at [facility], [place] [address] [place], [place] [address]    Report Status 08/21/2020 FINAL  Final  Blood Culture (routine x 2)     Status: None   Collection Time: 08/16/20  8:00 PM   Specimen: BLOOD  Result Value Ref Range Status   Specimen Description   Final    BLOOD LEFT ANTECUBITAL Performed at [facility] [address] [place], [place] [address]    Special Requests   Final    BOTTLES DRAWN AEROBIC AND ANAEROBIC Blood Culture adequate volume Performed at [facility] [address] [place], [place] [address]    Culture   Final    NO GROWTH 5 DAYS Performed at [facility], [place] [address] [place], [place] [address]    Report Status 08/21/2020 FINAL  Final  Respiratory Panel by RT PCR (Flu A&B, Covid) - Nasopharyngeal Swab     Status: None   Collection  Time: 08/16/20  8:15 PM   Specimen: Nasopharyngeal Swab  Result Value Ref Range Status   SARS Coronavirus 2 by RT PCR NEGATIVE NEGATIVE Final    Comment: (NOTE) SARS-CoV-2 target nucleic acids are NOT DETECTED.  The SARS-CoV-2 RNA is generally detectable in upper respiratoy specimens during the acute phase of infection. The lowest concentration of SARS-CoV-2 viral copies this assay can detect is 131 copies/mL. A negative result does not preclude SARS-Cov-2 infection and should not be used as the sole basis for treatment or other patient management decisions. A negative result may occur with  improper specimen collection/handling, submission of specimen other than nasopharyngeal swab, presence of viral mutation(s) within the areas targeted by this assay, and inadequate number of viral copies (<131 copies/mL). A negative result must be combined with clinical observations, patient history, and epidemiological information. The expected result is Negative.  Fact Sheet for Patients:  PinkCheek.be  Fact  Sheet for Healthcare Providers:  GravelBags.it  This test is no t yet approved or cleared by the [place] FDA and  has been authorized for detection and/or diagnosis of SARS-CoV-2 by FDA under an Emergency Use Authorization (EUA). This EUA will remain  in effect (meaning this test can be used) for the duration of the COVID-19 declaration under Section 564(b)(1) of the Act, 21 U.S.C. section 360bbb-3(b)(1), unless the authorization is terminated or revoked sooner.     Influenza A by PCR NEGATIVE NEGATIVE Final   Influenza B by PCR NEGATIVE NEGATIVE Final    Comment: (NOTE) The Xpert Xpress SARS-CoV-2/FLU/RSV assay is intended as an aid in  the diagnosis of influenza from Nasopharyngeal swab specimens and  should not be used as a sole basis for treatment. Nasal washings and  aspirates are unacceptable for Xpert Xpress SARS-CoV-2/FLU/RSV  testing.  Fact Sheet for Patients: PinkCheek.be  Fact Sheet for Healthcare Providers: GravelBags.it  This test is not yet approved or cleared by the [place] FDA and  has been authorized for detection and/or diagnosis of SARS-CoV-2 by  FDA under an Emergency Use Authorization (EUA). This EUA will remain  in effect (meaning this test can be used) for the duration of the  Covid-19 declaration under Section 564(b)(1) of the Act, 21  U.S.C. section 360bbb-3(b)(1), unless the authorization is  terminated or revoked. Performed at [facility], [place] [address], [place], [place] [address]   Urine culture     Status: Abnormal   Collection Time: 08/17/20  2:00 AM   Specimen: In/Out Cath Urine  Result Value Ref Range Status   Specimen Description   Final    IN/OUT CATH URINE Performed at [facility] [address] [place], [place] [address]    Special Requests   Final    NONE Performed at [facility], [place] [address] [place], [place] [address]    Culture >=100,000 COLONIES/mL ESCHERICHIA COLI (A)  Final   Report Status 08/19/2020 FINAL  Final   Organism ID, Bacteria ESCHERICHIA COLI (A)  Final      Susceptibility   Escherichia coli - MIC*    AMPICILLIN >=32 RESISTANT Resistant     CEFAZOLIN 16 SENSITIVE Sensitive     CEFTRIAXONE <=0.25 SENSITIVE Sensitive     CIPROFLOXACIN >=4 RESISTANT Resistant     GENTAMICIN <=1 SENSITIVE Sensitive     IMIPENEM <=0.25 SENSITIVE Sensitive     NITROFURANTOIN <=16 SENSITIVE Sensitive     TRIMETH/SULFA >=320 RESISTANT Resistant     AMPICILLIN/SULBACTAM >=32 RESISTANT  Resistant     PIP/TAZO <=4 SENSITIVE Sensitive     * >=100,000 COLONIES/mL ESCHERICHIA COLI  Culture, Urine     Status: Abnormal   Collection Time: 08/17/20  9:50 AM   Specimen: Urine, Random  Result Value Ref Range Status   Specimen Description   Final    URINE, RANDOM Performed at [facility] [address] [place], [place] [address]    Special Requests   Final    NONE Performed at [facility], [place] [address] [place], [place] [place] [id number]    Culture >=100,000 COLONIES/mL ESCHERICHIA COLI (A)  Final   Report Status 08/19/2020 FINAL  Final   Organism ID, Bacteria ESCHERICHIA COLI (A)  Final      Susceptibility   Escherichia coli - MIC*    AMPICILLIN >=32 RESISTANT Resistant     CEFAZOLIN 16 SENSITIVE Sensitive     CEFTRIAXONE <=0.25 SENSITIVE Sensitive     CIPROFLOXACIN >=4 RESISTANT Resistant     GENTAMICIN <=1 SENSITIVE Sensitive     IMIPENEM <=0.25 SENSITIVE Sensitive     NITROFURANTOIN <=16 SENSITIVE Sensitive     TRIMETH/SULFA >=320 RESISTANT Resistant     AMPICILLIN/SULBACTAM >=32 RESISTANT Resistant     PIP/TAZO 64 INTERMEDIATE Intermediate     * >=100,000 COLONIES/mL ESCHERICHIA COLI    RN Pressure Injury Documentation:     Estimated body mass index is 17.31 kg/m as calculated from the following:   Height as  of this encounter: 6\' 1"  (1.854 m).   Weight as of this encounter: 59.5 kg.  Malnutrition Type:      Malnutrition Characteristics:      Nutrition Interventions:    Radiology Studies: DG Abd 1 View  Result Date: 08/21/2020 CLINICAL DATA:  SBO, abdominal distension EXAM: ABDOMEN - 1 VIEW COMPARISON:  Plain film of the abdomen dated 08/20/2020. CT abdomen dated 08/16/2020. FINDINGS: Distended gas-filled loops of small and large bowel are again seen throughout the abdomen and upper pelvis. LEFT lower quadrant ostomy. No evidence of free intraperitoneal air is seen. IMPRESSION: Distended gas-filled loops of bowel throughout the abdomen and upper pelvis, some component of which corresponds to the small-bowel obstruction demonstrated on earlier CT abdomen of 08/16/2020. Electronically Signed   By: Franki Cabot M.D.   On: 08/21/2020 06:24   DG Abd 1 View  Result Date: 08/20/2020 CLINICAL DATA:  Small bowel obstruction EXAM: ABDOMEN - 1 VIEW COMPARISON:  Yesterday FINDINGS: Unchanged gaseous colonic distension above a colostomy. The enteric tube tip is at the stomach with side port near the GE junction. No concerning mass effect or gas collection. IMPRESSION: Unchanged gaseous colonic distension above a colostomy. No gas dilated small bowel. Electronically Signed   By: Monte Fantasia M.D.   On: 08/20/2020 06:53   Scheduled Meds: . Chlorhexidine Gluconate Cloth  6 each Topical Daily  . heparin injection (subcutaneous)  5,000 Units Subcutaneous Q8H  . pantoprazole (PROTONIX) IV  40 mg Intravenous Q12H  . tamsulosin  0.4 mg Oral Daily   Continuous Infusions: . sodium chloride 75 mL/hr at 08/20/20 1810  . cefTRIAXone (ROCEPHIN)  IV 1 g (08/21/20 1040)  . sodium phosphate  Dextrose 5% IVPB 30 mmol (08/21/20 1136)    LOS: 5 days   Kerney Elbe, DO [facility] PAGER is on [place]  If 7PM-7AM, please contact night-coverage [email or web address]

## 2020-08-22 DIAGNOSIS — K56609 Unspecified intestinal obstruction, unspecified as to partial versus complete obstruction: Secondary | ICD-10-CM | POA: Diagnosis not present

## 2020-08-22 DIAGNOSIS — N179 Acute kidney failure, unspecified: Secondary | ICD-10-CM | POA: Diagnosis not present

## 2020-08-22 DIAGNOSIS — N200 Calculus of kidney: Secondary | ICD-10-CM | POA: Diagnosis not present

## 2020-08-22 LAB — CBC WITH DIFFERENTIAL/PLATELET
Abs Immature Granulocytes: 0.03 10*3/uL (ref 0.00–0.07)
Basophils Absolute: 0.1 10*3/uL (ref 0.0–0.1)
Basophils Relative: 1 %
Eosinophils Absolute: 4.2 10*3/uL — ABNORMAL HIGH (ref 0.0–0.5)
Eosinophils Relative: 38 %
HCT: 34.9 % — ABNORMAL LOW (ref 39.0–52.0)
Hemoglobin: 11.1 g/dL — ABNORMAL LOW (ref 13.0–17.0)
Immature Granulocytes: 0 %
Lymphocytes Relative: 13 %
Lymphs Abs: 1.5 10*3/uL (ref 0.7–4.0)
MCH: 29.4 pg (ref 26.0–34.0)
MCHC: 31.8 g/dL (ref 30.0–36.0)
MCV: 92.6 fL (ref 80.0–100.0)
Monocytes Absolute: 0.7 10*3/uL (ref 0.1–1.0)
Monocytes Relative: 6 %
Neutro Abs: 4.7 10*3/uL (ref 1.7–7.7)
Neutrophils Relative %: 42 %
Platelets: 217 10*3/uL (ref 150–400)
RBC: 3.77 MIL/uL — ABNORMAL LOW (ref 4.22–5.81)
RDW: 12.9 % (ref 11.5–15.5)
WBC: 11.2 10*3/uL — ABNORMAL HIGH (ref 4.0–10.5)
nRBC: 0 % (ref 0.0–0.2)

## 2020-08-22 LAB — COMPREHENSIVE METABOLIC PANEL
ALT: 12 U/L (ref 0–44)
AST: 14 U/L — ABNORMAL LOW (ref 15–41)
Albumin: 2.5 g/dL — ABNORMAL LOW (ref 3.5–5.0)
Alkaline Phosphatase: 60 U/L (ref 38–126)
Anion gap: 4 — ABNORMAL LOW (ref 5–15)
BUN: 9 mg/dL (ref 8–23)
CO2: 23 mmol/L (ref 22–32)
Calcium: 8.5 mg/dL — ABNORMAL LOW (ref 8.9–10.3)
Chloride: 107 mmol/L (ref 98–111)
Creatinine, Ser: 0.79 mg/dL (ref 0.61–1.24)
GFR, Estimated: >60 mL/min (ref >60)
Glucose, Bld: 100 mg/dL — ABNORMAL HIGH (ref 70–99)
Potassium: 4.5 mmol/L (ref 3.5–5.1)
Sodium: 134 mmol/L — ABNORMAL LOW (ref 135–145)
Total Bilirubin: 0.4 mg/dL (ref 0.3–1.2)
Total Protein: 6 g/dL — ABNORMAL LOW (ref 6.5–8.1)

## 2020-08-22 LAB — MAGNESIUM: Magnesium: 1.9 mg/dL (ref 1.7–2.4)

## 2020-08-22 LAB — PHOSPHORUS: Phosphorus: 1.9 mg/dL — ABNORMAL LOW (ref 2.5–4.6)

## 2020-08-22 MED ORDER — ENSURE ENLIVE PO LIQD
237.0000 mL | Freq: Two times a day (BID) | ORAL | Status: DC
  Administered 2020-08-22 – 2020-08-28 (×11): 237 mL via ORAL

## 2020-08-22 MED ORDER — SODIUM PHOSPHATES 45 MMOLE/15ML IV SOLN
30.0000 mmol | Freq: Once | INTRAVENOUS | Status: AC
  Administered 2020-08-22: 30 mmol via INTRAVENOUS
  Filled 2020-08-22: qty 10

## 2020-08-22 MED ORDER — ACETAMINOPHEN 325 MG PO TABS
650.0000 mg | ORAL_TABLET | Freq: Four times a day (QID) | ORAL | Status: DC | PRN
  Administered 2020-08-26 – 2020-08-27 (×3): 650 mg via ORAL
  Filled 2020-08-22 (×3): qty 2

## 2020-08-22 MED ORDER — OXYCODONE HCL 5 MG PO TABS
5.0000 mg | ORAL_TABLET | ORAL | Status: DC | PRN
  Administered 2020-08-22 – 2020-08-27 (×9): 5 mg via ORAL
  Filled 2020-08-22 (×9): qty 1

## 2020-08-22 MED ORDER — MAGNESIUM SULFATE IN D5W 1-5 GM/100ML-% IV SOLN
1.0000 g | Freq: Once | INTRAVENOUS | Status: AC
  Administered 2020-08-22: 1 g via INTRAVENOUS
  Filled 2020-08-22: qty 100

## 2020-08-22 MED ORDER — POLYETHYLENE GLYCOL 3350 17 G PO PACK
17.0000 g | PACK | Freq: Two times a day (BID) | ORAL | Status: DC
  Administered 2020-08-22 – 2020-08-28 (×13): 17 g via ORAL
  Filled 2020-08-22 (×11): qty 1

## 2020-08-22 MED ORDER — HYDROMORPHONE HCL 1 MG/ML IJ SOLN
0.5000 mg | INTRAMUSCULAR | Status: DC | PRN
  Administered 2020-08-22 – 2020-08-28 (×18): 0.5 mg via INTRAVENOUS
  Filled 2020-08-22 (×19): qty 0.5

## 2020-08-22 NOTE — Progress Notes (Signed)
[facility] Progress Note     Subjective: CC-  Sitting up in bed drinking liquids. He took in several full liquids yesterday. Denies any current abdominal complaints. No nausea, vomiting, bloating. He reports intermittent suprapubic abdominal pain associated with the catheter, for which he is still taking dilaudid. States that he had to empty air out of colostomy pouch multiple times last night. He has about 1 teaspoon of yellow fluid in colostomy pouch which he states is typically what he gets right before he passes stool.  Objective: Vital signs in last 24 hours: Temp:  [98 F (36.7 C)-98.1 F (36.7 C)] 98.1 F (36.7 C) (10/25 0545) Pulse Rate:  [78-80] 78 (10/25 0545) Resp:  [16-20] 16 (10/25 0545) BP: (95-111)/(56-70) 95/56 (10/25 0545) SpO2:  [95 %-96 %] 95 % (10/25 0545)    Intake/Output from previous day: 10/24 0701 - 10/25 0700 In: 2451.5 [P.O.:840; I.V.:1151.5; IV Piggyback:460] Out: 2542 [Urine:4375] Intake/Output this shift: No intake/output data recorded.  PE: Gen:  Alert, NAD Pulm: rate and effort normal Abd: Soft, ND, nontender, + BS heard, ostomy viable/ air and 1tsp yellow liquid in pouch/ able to digitize stoma GU: foley in place Skin: no rashes noted, warm and dry   Lab Results:  Recent Labs    08/21/20 0525 08/22/20 0431  WBC 11.3* 11.2*  HGB 11.4* 11.1*  HCT 36.0* 34.9*  PLT 219 217   BMET Recent Labs    08/21/20 0525 08/22/20 0431  NA 134* 134*  K 4.2 4.5  CL 105 107  CO2 20* 23  GLUCOSE 90 100*  BUN 9 9  CREATININE 0.77 0.79  CALCIUM 7.9* 8.5*   PT/INR No results for input(s): LABPROT, INR in the last 72 hours. CMP     Component Value Date/Time   NA 134 (L) 08/22/2020 0431   K 4.5 08/22/2020 0431   CL 107 08/22/2020 0431   CO2 23 08/22/2020 0431   GLUCOSE 100 (H) 08/22/2020 0431   BUN 9 08/22/2020 0431   CREATININE 0.79 08/22/2020 0431   CALCIUM 8.5 (L) 08/22/2020 0431   CALCIUM 11.8 (H) 08/17/2020 0430   PROT  6.0 (L) 08/22/2020 0431   ALBUMIN 2.5 (L) 08/22/2020 0431   AST 14 (L) 08/22/2020 0431   ALT 12 08/22/2020 0431   ALKPHOS 60 08/22/2020 0431   BILITOT 0.4 08/22/2020 0431   GFRNONAA >60 08/22/2020 0431   GFRAA >60 11/21/2019 1524   Lipase     Component Value Date/Time   LIPASE 43 08/16/2020 1955       Studies/Results: DG Abd 1 View  Result Date: 08/21/2020 CLINICAL DATA:  SBO, abdominal distension EXAM: ABDOMEN - 1 VIEW COMPARISON:  Plain film of the abdomen dated 08/20/2020. CT abdomen dated 08/16/2020. FINDINGS: Distended gas-filled loops of small and large bowel are again seen throughout the abdomen and upper pelvis. LEFT lower quadrant ostomy. No evidence of free intraperitoneal air is seen. IMPRESSION: Distended gas-filled loops of bowel throughout the abdomen and upper pelvis, some component of which corresponds to the small-bowel obstruction demonstrated on earlier CT abdomen of 08/16/2020. Electronically Signed   By: Franki Cabot M.D.   On: 08/21/2020 06:24    Anti-infectives: Anti-infectives (From admission, onward)   Start     Dose/Rate Route Frequency Ordered Stop   08/17/20 0930  cefTRIAXone (ROCEPHIN) 1 g in sodium chloride 0.9 % 100 mL IVPB        1 g 200 mL/hr over 30 Minutes Intravenous Every 24 hours 08/17/20 0856  Assessment/Plan Chest pain, abnormal EKG - cardiology following  Tobacco abuse UTI Nephrolithiasis BPH - patient self-caths, foley placed Hypercalcemia - resolved Hypophosphatemia  AKI - resolved Right nephrolithiasis E coli UTI  SBO related to ostomy - Continues to pass a lot of air from ostomy, although no stool. He is tolerating full liquids without abdominal pain, nausea, vomiting. Continue full liquids and add miralax BID. Add Ensure. Advance diet once he passes stool. Mobilize.  ID - rocephin 10/20>> FEN - IVF, FLD, Ensure VTE - SCDs, ok for chemical DVT prophylaxis from surgical standpint Foley - in place Follow up -  TBD   LOS: 6 days    [name], [facility] Surgery 08/22/2020, 10:16 AM Please see Amion for pager number during day hours 7:00am-4:30pm

## 2020-08-22 NOTE — Progress Notes (Signed)
PROGRESS NOTE    Jamie Wilson  BMW:[id number] DOB: 01-03-1956 DOA: 08/16/2020 PCP: Patient, [name]   Brief Narrative:  HPI per Dr. Zada Finders on 08/16/20 Jamie Wilson is a 64 y.o. 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  Patient states he has a history of bowel obstruction for which partial resection was performed in January 2018 with subsequent colostomy placement.  He says he was in prison at the time of the surgery.  He has not had routine surgical follow-up since then.  He has had several days of worsening abdominal pain with nausea and vomiting.  He has had decreased output from his colostomy.  He had worsening emesis today and came to the ED for further evaluation.  He says while in the waiting area he vomited 4 more times.  He says he has a history of kidney stones and has been having bilateral flank pain.  He says he has to self cath for urine output and does have some discomfort when he urinates.  He also reports significant heartburn sensation and chest discomfort recently.  He says on arrival to the ED he was having significant trouble breathing which has now resolved since NG tube was placed.  He says he currently lives in a boardinghouse.  He is not in contact with close family.  He reports smoking about 5 cigars per day. He reports occasional alcohol use about once a week.  He denies any history of alcohol withdrawal.  He reports occasional marijuana use and denies any cocaine or IV drug use.  He says he is not taking any medications regularly.  He is not on any calcium or vitamin supplements.  ED Course:  Initial vitals showed BP 142/84, pulse 125, RR 30, temp 97.7 Fahrenheit, SPO2 92% on room air.  Labs significant for calcium 14.5, BUN 31, creatinine 1.48, sodium 144, potassium 4.0, bicarb 29, serum glucose 143, albumin 3.8,  LFTs within normal limits, WBC 16.7, hemoglobin 15.6, platelets 348,000, lactic acid 1.4, lipase 43, high-sensitivity troponin I 5 > 4.  FOBT is negative.  Blood cultures were obtained and pending.  SARS-CoV-2 PCR is negative.  Influenza A/B PCR are negative.  Portable chest x-ray showed right base atelectasis.  CT abdomen/pelvis with contrast shows small bowel herniation through the left lower quadrant ostomy defect causing high-grade small bowel obstruction.  Right nephrolithiasis without hydronephrosis seen as well as layering high density material in the posterior urinary bladder.  NG tube was placed in follow-up imaging showed enteric tube tip in the left upper quadrant consistent with location in the upper stomach.  EDP discussed with on-call general surgery team will evaluate in the morning.  Medical admission was requested and the hospitalist service was consulted to admit for further evaluation and management.  Patient was given 1 L LR, 4 mg IV Zofran, and 40 mg IV Protonix while in the ED.  **Interim History Patient was admitted for small bowel obstruction and incidentally his EKG showed mildly diffuse ST elevation so cardiology was consulted and felt that patient possibly has pericarditis.  General surgery recommending continuing the NG tube in place and will continue n.p.o. and supportive care for now.  NG tube is connected to suction and patient is less distended today but still complaining of abdominal soreness.  08/18/20: KUB showing some improvement however patient did not have any output from his ostomy yet  still.  Complaining of burning and discomfort in her urine and feels like his bladder spasming cardiology consulted for his diffuse ST elevations and echo showed an EF of 60 to 65% with normal diastolic function.  Cardiology recommending outpatient ischemic evaluation with NST versus coronary CTA if his heart rate improves and they will add aspirin and statin when tolerating  p.o..  08/19/20: Patient still has not had any output from his ostomy bag and urine culture is growing E. coli which is sensitive to ceftriaxone. Foley catheter will remain in place. NG tube in place and still putting out and KUB showing a nonobstructive bowel gas pattern and air throughout the colon that is unchanged. Because he has not had any improvement in the last 48 hours, general surgery considering a laparoscopic evaluation this afternoon to ensure that there is no mechanical issue noted. General surgery also added IV PPI due to concern for NG induced gastritis  08/20/2020: Patient did not actually go for his laparoscopic procedure as he started improving and started having gas in his colostomy bag.  This morning his NG tube is being clamped and planning on being removed by general surgery.  His diet was advanced to clears and is tolerating it with the tube clamped.  They are going to DC his NG tube today and continue clears and recommending continue IV PPI given his NG tube gastritis.  Patient continues to have significant air but not any stool in his colostomy bag today.  08/21/2020: Patient is tolerating clear liquid diet and advance to full liquid diet today.  He does have some swelling in his ostomy site.  We will continue IV fluid hydration for now and he is ambulating fairly well.  Continues to have air in his colostomy bag with no output of stool yet.  Possible remains significantly low still.  08/22/2020: Felt much better after his catheter change yesterday but feels like he still has a lot of sediment in it.  I discussed with him that we would flush it today.  General surgery feels that even though he is tolerating full liquid diet without abdominal pain they will recommend continuing full liquid diet and adding MiraLAX twice daily for now until he passes stool.  Therapy also can add Ensure and continue to mobilize the patient.   Assessment & Plan:   Principal Problem:   Small bowel  obstruction (HCC) Active Problems:   Hypercalcemia   AKI (acute kidney injury) ([place])   Right nephrolithiasis  Small bowel obstruction secondary to herniation through ostomy,, slowly improving -General surgery consulted and patient now has an NG tube and hernia appears to be reduced. -NG tube in place, continue low intermittent suction for now and general surgery to reassess the tube later today -CT Abdomen showed "Small bowel herniation through the left lower quadrant the ostomy defect causing high-grade small bowel obstruction."  -Monitor strict I/O's -Keep n.p.o. and continue bowel rest; now has some ice chips -Repeat KUB in a.m. again; KUB this AM showed "Distended gas-filled loops of bowel throughout the abdomen and upper pelvis, some component of which corresponds to the small-bowel obstruction demonstrated on earlier CT abdomen of 08/16/2020." -WBC Went from 16.7 -> 12.5 -> and normalized to 8.5 but started worsening again slightly and went to 11.0 -> 11.4 -> 11.3 ->11.2  -Antiemetics and analgesics as needed; continue with p.o./IV ondansetron 4 mg every 6 hours as needed nausea as well as 0.5 mg IV hydromorphone every 4 hours as needed severe pain -Continue  with supportive care mobilize the patient.  Ensure potassium is greater than 4 and magnesium is greater than 2; mag is now 1.9 and potassium is 4.5 -He did not actually undergo a laparoscopic procedure the day before yesterday given that he started to improve.  Diet has been advanced from clears to full's and general surgery recommending continuing FULL LIQUID DIET despite him having a lot of air from his ostomy as the again added Neuralex twice daily and advance his diet once he passes some stool -We will obtain PT OT evaluation to have the patient ambulate and they have no further recommendations -Further care per general surgery  NG induced Gastritis -There is Concern given that he had dark red-tinged output from his NG yesterday  morning  -General surgery is adding IV PPI Pantoprazole 40 mg q12h -He continues concurrently with some heartburn -We will continue to monitor carefully  Hypercalcemia -Corrected calcium 14.7 on admission.  Last calcium 9.4 11/21/2019. -Start IV fluid hydration with NS@ 150 mL/hour and will continue for 16 hours and now that has stopped and IVF resumed at 75 mL/hr given that he is still npo -Started IV calcitonin 4 units/kg sq twice daily for max 4 doses and now stopped  -Give IV zoledronic acid 4 mg once now -PTH intact was 8, and Calicum Total (PTH) was 11.8 -Strict urine output, bladder scan and in and out cath as needed -Repeat Labs improving and Ca2+ is now 8.5 and corrected for Albumin of 2.5 the Ca2+ is 9.7 -C/w IVF Hydration with normal saline at 75 MLS per hour and will reduce to 50 mL/hr  Metabolic Acidosis -Mild. Patient's CO2 is now 20, chloride level is 105, anion gap is 9 -Continue IV fluid hydration with normal saline at 75 mils per hour the morning -Continue monitor and trend carefully and repeat CMP in a.m.  Acute Kidney Injury, improving  -Likely prerenal from SBO and GI losses.   -Patient's BUN/Cr went from 31/1.48 -> 27/1.22 -> 24/1.11 -> 24/0.83 -> 16/0.79 -> 9/2.77 -He was given 1 L lactated Ringer's bolus and started on IV fluid hydration with normal saline at 150 MLS per hour for 16 hours and this expired so will renew IVF with NS at 75 mL/hr and will continue for now and discontinue in the morning as likely diet will be advanced -Continue IV fluid hydration as above and repeat labs in a.m. -Avoid nephrotoxic medications, contrast dyes, hypotension and renally dose medications -Continue monitor and trend renal function and repeat CMP in a.m.  Right Nephrolithiasis: -Seen on CT imaging without hydronephrosis.   -Small layering stones noted in the urinary bladder as well as abnormal calcifications within the lumen of the bladder, may be along the thickened bladder  wall per radiology report. -Continue IV fluids and analgesics as above -Will add Tamsulosin 0.4 mg Daily -Also will add B&O Suppositories  -U/A as below; now having some sediment in his Foley catheter and Foley catheter was exchanged yesterday given the blockage  BPH -Will place Foley Catheter as patient Self Cath's -Discussed with Urology Dr. Diona Fanti and recommended Foley -Will add Tamsulosin as above and continue full liquid diet  E Coli UTI, poA -In the setting of Self Cath and SBO -WBC on admission was 16.7 and trended down to 12.5 and could be in the setting of dehydration, however urinalysis showed a cloudy appearance with moderate hemoglobin, 5 ketones, moderate leukocytes, positive nitrites, 100 protein, greater than 1.046 specific gravity, few bacteria, greater than 50 RBCs  per high-power field, and greater than 50 WBCs -Urine culture still pending sensitivities but show >100,000 CFU of GNR x2; Susceptibilities showed E Coli with sensitivities to cefazolin, ceftriaxone, gentamicin, imipenem, and nitrofurantoin -Will empirically start antibiotics with IV Ceftriaxone 1 g every 24 hours and escalate/de-escalate as needed; today is day 6 of 7 -CTA showed "Right nephrolithiasis. No hydronephrosis. Layering high-density material posteriorly in the urinary bladder, likely small layering stones. Markedly thickened bladder wall abnormal calcifications seen within the lumen of the bladder, difficult to determine exact source and location. These could be along the thickened bladder wall. Aortic atherosclerosis. Hepatic and renal cysts."  Abnormal EKG with Concern for Pericarditis -Patient had an abnormal EKG on Admission with mild ST Elevation which was diffuse -Cardiology consulted and reviewed EKG and felt it not to be a STEMI but felt it was possible Pericarditis -High-sensitivity troponins were done and negative x2 as initial 1 was 5 and repeat was 4 -Cardiology recommending repeating EKG  in the morning and possibly an echo for signs of pericardial effusion -Currently patient remains n.p.o. and is on bowel rest today have not initiated colchicine or NSAIDs for now -ECHO done and normal -Cardiology checked lipid panel showed a total cholesterol/HDL ratio of 2.7, cholesterol level of 105, HDL 39, LDL 51, triglycerides of 74, VLDL 15 -Cardiology planning on NST as an outpatient or Coronary CTA in the outpatient and will add ASA and Statin when taking po  -Cardiology recommending no NSAIDs or colchicine at this time given the patient is relatively asymptomatic and there have been to arrange outpatient cardiology follow-up for exertional chest pain with plans for stress testing on a nonemergent basis once he is clinically improved  Hyperglycemia in the setting of Prediabetes -Patient's blood sugar on admission was 143 and repeat this morning was 90 -Check hemoglobin A1c to rule out diabetes and was 5.7 -Continue to monitor blood sugars carefully and if necessary will place on sensitive sliding scale insulin AC  Hypophosphatemia -Patient's Phos Level this AM was 1.9 -Replete again with IV sodium Phos 30 mmol today -Continue to Monitor and Replete as Necessary -Repeat Phos Level in the AM   Ostomy Swelling -Per General Surgery -General surgery PA evaluated and felt that is soft and felt his hernia was reducible and she was able to digitized his stoma will discuss with Dr. Harlow Asa about further recommendations  Hyponatremia -Mild with a sodium of 134 -Replete as above  -Continue monitor and trend -Repeat CMP in a.m.  Normocytic Anemia -? Dilutional Drop -Patient's Hgb/Hct went from 15.6/49.5 (Likely hemoconcentrated on admission) and trended down to 13.9/43.9 -> 12.2/39.6 -> 12.4/39.0 -> 11.5/37.1 -> 11.4/36.0 -> 11.1/34.9 and is likely from a dilutional drop given that his fluids have been renewed -Check Anemia Panel and showed an iron level of 25, U IBC of 222, TIBC of 247,  saturation ratios of 10%, ferritin level 156, folate level 6.3, vitamin B12 118 -We will not give IV iron supplementation given his urinary tract infection -Continue to Monitor for S/Sx of Bleeding; Currently no overt bleeding noted -Repeat CBC in the AM  Sinus Tachycardia, improved -Likely physiological response in relation to his small bowel obstruction and likely UTI as well as flank pain from nephrolithiasis -Cardiology recommending to continue to monitor on telemetry for now   DVT prophylaxis: SCDs; will add Heparin 5,000 units sq q8h Code Status: FULL CODE Family Communication: None Disposition Plan: Pending further clinical improvement back to baseline and tolerance of p.o. diet and clearance  by cardiology and general surgery  Status is: Inpatient  Remains inpatient appropriate because:Unsafe d/c plan, IV treatments appropriate due to intensity of illness or inability to take PO and Inpatient level of care appropriate due to severity of illness   Dispo: The patient is from: Home              Anticipated d/c is to: Home              Anticipated d/c date is: 1-2 days when he is able to tolerate p.o and cleared by General Surgery.              Patient currently is not medically stable to d/c.  Consultants:   General Surgery  Cardiology  Discussed Case with Urology   Procedures: NGT insertion; possible Laparoscopy   Antimicrobials:  Anti-infectives (From admission, onward)   Start     Dose/Rate Route Frequency Ordered Stop   08/17/20 0930  cefTRIAXone (ROCEPHIN) 1 g in sodium chloride 0.9 % 100 mL IVPB        1 g 200 mL/hr over 30 Minutes Intravenous Every 24 hours 08/17/20 0856         Subjective: Seen and examined at bedside and he still states that he has a lot of air in his ostomy bag but continues to have no solid fecal output.  Does have some mild liquid in there.  No nausea or vomiting.  States his abdomen is sore and is still hurting still.  Feels like his  Foley catheter is improving but thinks he is got a lot of sediment in it and feels some more distention in his bladder so we will ask the nursing staff to flush it.  He denies any chest pain, heaviness or dizziness.  No other concerns or complaints at this time and tolerating full liquids without issue.  Objective: Vitals:   08/21/20 0607 08/21/20 1332 08/21/20 2214 08/22/20 0545  BP: 107/73 111/70 101/67 (!) 95/56  Pulse: 74 79 80 78  Resp: 18 18 20 16   Temp: 98.6 F (37 C) 98 F (36.7 C) 98 F (36.7 C) 98.1 F (36.7 C)  TempSrc:  Oral Oral Oral  SpO2: 96% 96% 96% 95%  Weight:      Height:        Intake/Output Summary (Last 24 hours) at 08/22/2020 1154 Last data filed at 08/22/2020 0500 Gross per 24 hour  Intake 2211.45 ml  Output 4375 ml  Net -2163.55 ml   Filed Weights   08/16/20 2006 08/17/20 0147 08/20/20 0458  Weight: 68 kg 68 kg 59.5 kg   Examination: Physical Exam:  Constitutional: The patient is a thin slightly disheveled Caucasian male currently in no acute distress appears calm and more comfortable Eyes: Lids and conjunctivae normal, sclerae anicteric  ENMT: External Ears, Nose appear normal. Grossly normal hearing.  Neck: Appears normal, supple, no cervical masses, normal ROM, no appreciable thyromegaly; no JVD Respiratory: Diminished to auscultation bilaterally, no wheezing, rales, rhonchi or crackles. Normal respiratory effort and patient is not tachypenic. No accessory muscle use.  Unlabored breathing Cardiovascular: RRR, no murmurs / rubs / gallops. S1 and S2 auscultated. No extremity edema.  Abdomen: Soft, mildly tender to palpate, slightly distended no masses palpated.  Ostomy is a little bit swollen and his ostomy bag has a lot of air in it and minimal liquid bowel sounds positive.  GU: Deferred.  Foley catheter in place draining amber dark color urine with some sediment Musculoskeletal: No clubbing /  cyanosis of digits/nails. No joint deformity upper and  lower extremities.  Skin: No rashes, lesions, ulcers limited skin evaluation. No induration; Warm and dry.  Neurologic: CN 2-12 grossly intact with no focal deficits. Romberg sign and cerebellar reflexes not assessed.  Psychiatric: Normal judgment and insight. Alert and oriented x 3. Normal mood and appropriate affect.    Data Reviewed: I have personally reviewed following labs and imaging studies  CBC: Recent Labs  Lab 08/18/20 0357 08/19/20 0359 08/20/20 0519 08/21/20 0525 08/22/20 0431  WBC 8.5 11.0* 11.4* 11.3* 11.2*  NEUTROABS 6.3 7.5 7.3 5.3 4.7  HGB 12.2* 12.4* 11.5* 11.4* 11.1*  HCT 39.6 39.0 37.1* 36.0* 34.9*  MCV 95.4 94.7 95.9 93.5 92.6  PLT 252 249 206 219 335   Basic Metabolic Panel: Recent Labs  Lab 08/18/20 0357 08/19/20 0359 08/20/20 0519 08/21/20 0525 08/22/20 0431  NA 144 142 136 134* 134*  K 3.6 3.7 3.9 4.2 4.5  CL 109 109 107 105 107  CO2 25 24 21* 20* 23  GLUCOSE 94 101* 111* 90 100*  BUN 24* 24* 16 9 9   CREATININE 1.11 0.83 0.79 0.77 0.79  CALCIUM 9.5 8.8* 8.4* 7.9* 8.5*  MG 2.0 2.0 1.7 2.1 1.9  PHOS 1.7* 1.2* 1.1* 1.2* 1.9*   GFR: Estimated Creatinine Clearance: 77.5 mL/min (by C-G formula based on SCr of 0.79 mg/dL). Liver Function Tests: Recent Labs  Lab 08/18/20 0357 08/19/20 0359 08/20/20 0519 08/21/20 0525 08/22/20 0431  AST 12* 12* 13* 11* 14*  ALT 10 11 8 9 12   ALKPHOS 62 60 56 62 60  BILITOT 0.6 0.6 0.4 0.4 0.4  PROT 6.4* 6.7 6.0* 6.0* 6.0*  ALBUMIN 2.7* 2.8* 2.5* 2.5* 2.5*   Recent Labs  Lab 08/16/20 1955  LIPASE 43   No results for input(s): AMMONIA in the last 168 hours. Coagulation Profile: Recent Labs  Lab 08/16/20 1955  INR 1.1   Cardiac Enzymes: No results for input(s): CKTOTAL, CKMB, CKMBINDEX, TROPONINI in the last 168 hours. BNP (last 3 results) No results for input(s): PROBNP in the last 8760 hours. HbA1C: No results for input(s): HGBA1C in the last 72 hours. CBG: No results for input(s): GLUCAP in  the last 168 hours. Lipid Profile: No results for input(s): CHOL, HDL, LDLCALC, TRIG, CHOLHDL, LDLDIRECT in the last 72 hours. Thyroid Function Tests: No results for input(s): TSH, T4TOTAL, FREET4, T3FREE, THYROIDAB in the last 72 hours. Anemia Panel: No results for input(s): VITAMINB12, FOLATE, FERRITIN, TIBC, IRON, RETICCTPCT in the last 72 hours. Sepsis Labs: Recent Labs  Lab 08/16/20 1955  LATICACIDVEN 1.4    Recent Results (from the past 240 hour(s))  Blood Culture (routine x 2)     Status: None   Collection Time: 08/16/20  7:55 PM   Specimen: BLOOD  Result Value Ref Range Status   Specimen Description   Final    BLOOD RIGHT ANTECUBITAL Performed at [facility] [address] [place], [place] [address]    Special Requests   Final    BOTTLES DRAWN AEROBIC AND ANAEROBIC Blood Culture adequate volume Performed at [facility] [address], [place], [place] [address]    Culture   Final    NO GROWTH 5 DAYS Performed at [facility], [place] [address], [place], [place] [address]    Report Status 08/21/2020 FINAL  Final  Blood Culture (routine x 2)     Status: None   Collection Time: 08/16/20  8:00 PM  Specimen: BLOOD  Result Value Ref Range Status   Specimen Description   Final    BLOOD LEFT ANTECUBITAL Performed at [facility] [address], [place], [place] [address]    Special Requests   Final    BOTTLES DRAWN AEROBIC AND ANAEROBIC Blood Culture adequate volume Performed at [facility] [address] [place], [place] [address]    Culture   Final    NO GROWTH 5 DAYS Performed at [facility], [place] [address] [place], [place] [address]    Report Status 08/21/2020 FINAL  Final  Respiratory Panel by RT PCR (Flu A&B, Covid) - Nasopharyngeal Swab     Status: None   Collection Time: 08/16/20  8:15 PM   Specimen: Nasopharyngeal Swab  Result Value Ref Range Status   SARS  Coronavirus 2 by RT PCR NEGATIVE NEGATIVE Final    Comment: (NOTE) SARS-CoV-2 target nucleic acids are NOT DETECTED.  The SARS-CoV-2 RNA is generally detectable in upper respiratoy specimens during the acute phase of infection. The lowest concentration of SARS-CoV-2 viral copies this assay can detect is 131 copies/mL. A negative result does not preclude SARS-Cov-2 infection and should not be used as the sole basis for treatment or other patient management decisions. A negative result may occur with  improper specimen collection/handling, submission of specimen other than nasopharyngeal swab, presence of viral mutation(s) within the areas targeted by this assay, and inadequate number of viral copies (<131 copies/mL). A negative result must be combined with clinical observations, patient history, and epidemiological information. The expected result is Negative.  Fact Sheet for Patients:  PinkCheek.be  Fact Sheet for Healthcare Providers:  GravelBags.it  This test is no t yet approved or cleared by the [place] FDA and  has been authorized for detection and/or diagnosis of SARS-CoV-2 by FDA under an Emergency Use Authorization (EUA). This EUA will remain  in effect (meaning this test can be used) for the duration of the COVID-19 declaration under Section 564(b)(1) of the Act, 21 U.S.C. section 360bbb-3(b)(1), unless the authorization is terminated or revoked sooner.     Influenza A by PCR NEGATIVE NEGATIVE Final   Influenza B by PCR NEGATIVE NEGATIVE Final    Comment: (NOTE) The Xpert Xpress SARS-CoV-2/FLU/RSV assay is intended as an aid in  the diagnosis of influenza from Nasopharyngeal swab specimens and  should not be used as a sole basis for treatment. Nasal washings and  aspirates are unacceptable for Xpert Xpress SARS-CoV-2/FLU/RSV  testing.  Fact Sheet for  Patients: PinkCheek.be  Fact Sheet for Healthcare Providers: GravelBags.it  This test is not yet approved or cleared by the [place] FDA and  has been authorized for detection and/or diagnosis of SARS-CoV-2 by  FDA under an Emergency Use Authorization (EUA). This EUA will remain  in effect (meaning this test can be used) for the duration of the  Covid-19 declaration under Section 564(b)(1) of the Act, 21  U.S.C. section 360bbb-3(b)(1), unless the authorization is  terminated or revoked. Performed at [facility], [place] [address] [place], [place] [address]   Urine culture     Status: Abnormal   Collection Time: 08/17/20  2:00 AM   Specimen: In/Out Cath Urine  Result Value Ref Range Status   Specimen Description   Final    IN/OUT CATH URINE Performed at [facility] [address] [place],  [address]    Special Requests   Final    NONE  Performed at [facility], [place] [address], [place], [place] [address]    Culture >=100,000 COLONIES/mL ESCHERICHIA COLI (A)  Final   Report Status 08/19/2020 FINAL  Final   Organism ID, Bacteria ESCHERICHIA COLI (A)  Final      Susceptibility   Escherichia coli - MIC*    AMPICILLIN >=32 RESISTANT Resistant     CEFAZOLIN 16 SENSITIVE Sensitive     CEFTRIAXONE <=0.25 SENSITIVE Sensitive     CIPROFLOXACIN >=4 RESISTANT Resistant     GENTAMICIN <=1 SENSITIVE Sensitive     IMIPENEM <=0.25 SENSITIVE Sensitive     NITROFURANTOIN <=16 SENSITIVE Sensitive     TRIMETH/SULFA >=320 RESISTANT Resistant     AMPICILLIN/SULBACTAM >=32 RESISTANT Resistant     PIP/TAZO <=4 SENSITIVE Sensitive     * >=100,000 COLONIES/mL ESCHERICHIA COLI  Culture, Urine     Status: Abnormal   Collection Time: 08/17/20  9:50 AM   Specimen: Urine, Random  Result Value Ref Range Status   Specimen Description   Final    URINE, RANDOM Performed at  [facility] [address], [place], [place] [id number]    Special Requests   Final    NONE Performed at [facility], [place] [address] [place], [place] [address]    Culture >=100,000 COLONIES/mL ESCHERICHIA COLI (A)  Final   Report Status 08/19/2020 FINAL  Final   Organism ID, Bacteria ESCHERICHIA COLI (A)  Final      Susceptibility   Escherichia coli - MIC*    AMPICILLIN >=32 RESISTANT Resistant     CEFAZOLIN 16 SENSITIVE Sensitive     CEFTRIAXONE <=0.25 SENSITIVE Sensitive     CIPROFLOXACIN >=4 RESISTANT Resistant     GENTAMICIN <=1 SENSITIVE Sensitive     IMIPENEM <=0.25 SENSITIVE Sensitive     NITROFURANTOIN <=16 SENSITIVE Sensitive     TRIMETH/SULFA >=320 RESISTANT Resistant     AMPICILLIN/SULBACTAM >=32 RESISTANT Resistant     PIP/TAZO 64 INTERMEDIATE Intermediate     * >=100,000 COLONIES/mL ESCHERICHIA COLI    RN Pressure Injury Documentation:     Estimated body mass index is 17.31 kg/m as calculated from the following:   Height as of this encounter: 6\' 1"  (1.854 m).   Weight as of this encounter: 59.5 kg.  Malnutrition Type:      Malnutrition Characteristics:      Nutrition Interventions:    Radiology Studies: DG Abd 1 View  Result Date: 08/21/2020 CLINICAL DATA:  SBO, abdominal distension EXAM: ABDOMEN - 1 VIEW COMPARISON:  Plain film of the abdomen dated 08/20/2020. CT abdomen dated 08/16/2020. FINDINGS: Distended gas-filled loops of small and large bowel are again seen throughout the abdomen and upper pelvis. LEFT lower quadrant ostomy. No evidence of free intraperitoneal air is seen. IMPRESSION: Distended gas-filled loops of bowel throughout the abdomen and upper pelvis, some component of which corresponds to the small-bowel obstruction demonstrated on earlier CT abdomen of 08/16/2020. Electronically Signed   By: Franki Cabot M.D.   On: 08/21/2020 06:24   Scheduled Meds:  Chlorhexidine Gluconate Cloth  6 each  Topical Daily   feeding supplement  237 mL Oral BID BM   heparin injection (subcutaneous)  5,000 Units Subcutaneous Q8H   pantoprazole (PROTONIX) IV  40 mg Intravenous Q12H   polyethylene glycol  17 g Oral BID   tamsulosin  0.4 mg Oral Daily   Continuous Infusions:  sodium chloride 75 mL/hr at 08/22/20 1004   cefTRIAXone (ROCEPHIN)  IV 1 g (  08/22/20 4497)   sodium phosphate  Dextrose 5% IVPB 30 mmol (08/22/20 1111)    LOS: 6 days   Kerney Elbe, DO [facility] PAGER is on [place]  If 7PM-7AM, please contact night-coverage [email or web address]

## 2020-08-22 NOTE — Plan of Care (Signed)

## 2020-08-23 DIAGNOSIS — K56609 Unspecified intestinal obstruction, unspecified as to partial versus complete obstruction: Secondary | ICD-10-CM | POA: Diagnosis not present

## 2020-08-23 DIAGNOSIS — N179 Acute kidney failure, unspecified: Secondary | ICD-10-CM | POA: Diagnosis not present

## 2020-08-23 DIAGNOSIS — N200 Calculus of kidney: Secondary | ICD-10-CM | POA: Diagnosis not present

## 2020-08-23 LAB — COMPREHENSIVE METABOLIC PANEL
ALT: 18 U/L (ref 0–44)
AST: 17 U/L (ref 15–41)
Albumin: 2.6 g/dL — ABNORMAL LOW (ref 3.5–5.0)
Alkaline Phosphatase: 69 U/L (ref 38–126)
Anion gap: 7 (ref 5–15)
BUN: 12 mg/dL (ref 8–23)
CO2: 23 mmol/L (ref 22–32)
Calcium: 9.5 mg/dL (ref 8.9–10.3)
Chloride: 106 mmol/L (ref 98–111)
Creatinine, Ser: 0.82 mg/dL (ref 0.61–1.24)
GFR, Estimated: >60 mL/min (ref >60)
Glucose, Bld: 114 mg/dL — ABNORMAL HIGH (ref 70–99)
Potassium: 5 mmol/L (ref 3.5–5.1)
Sodium: 136 mmol/L (ref 135–145)
Total Bilirubin: 0.5 mg/dL (ref 0.3–1.2)
Total Protein: 6.4 g/dL — ABNORMAL LOW (ref 6.5–8.1)

## 2020-08-23 LAB — CBC WITH DIFFERENTIAL/PLATELET
Abs Immature Granulocytes: 0.04 10*3/uL (ref 0.00–0.07)
Abs Immature Granulocytes: 0.03 10*3/uL (ref 0.00–0.07)
Basophils Absolute: 0.1 10*3/uL (ref 0.0–0.1)
Basophils Absolute: 0.1 10*3/uL (ref 0.0–0.1)
Basophils Relative: 1 %
Basophils Relative: 1 %
Eosinophils Absolute: 4.4 10*3/uL — ABNORMAL HIGH (ref 0.0–0.5)
Eosinophils Absolute: 4.7 10*3/uL — ABNORMAL HIGH (ref 0.0–0.5)
Eosinophils Relative: 32 %
Eosinophils Relative: 38 %
HCT: 37.2 % — ABNORMAL LOW (ref 39.0–52.0)
HCT: 39.9 % (ref 39.0–52.0)
Hemoglobin: 12 g/dL — ABNORMAL LOW (ref 13.0–17.0)
Hemoglobin: 12.7 g/dL — ABNORMAL LOW (ref 13.0–17.0)
Immature Granulocytes: 0 %
Immature Granulocytes: 0 %
Lymphocytes Relative: 10 %
Lymphocytes Relative: 10 %
Lymphs Abs: 1.4 10*3/uL (ref 0.7–4.0)
Lymphs Abs: 1.2 10*3/uL (ref 0.7–4.0)
MCH: 29.6 pg (ref 26.0–34.0)
MCH: 29.7 pg (ref 26.0–34.0)
MCHC: 32.3 g/dL (ref 30.0–36.0)
MCHC: 31.8 g/dL (ref 30.0–36.0)
MCV: 91.9 fL (ref 80.0–100.0)
MCV: 93.2 fL (ref 80.0–100.0)
Monocytes Absolute: 0.7 10*3/uL (ref 0.1–1.0)
Monocytes Absolute: 0.7 10*3/uL (ref 0.1–1.0)
Monocytes Relative: 5 %
Monocytes Relative: 6 %
Neutro Abs: 7.1 10*3/uL (ref 1.7–7.7)
Neutro Abs: 5.6 10*3/uL (ref 1.7–7.7)
Neutrophils Relative %: 52 %
Neutrophils Relative %: 45 %
Platelets: 247 10*3/uL (ref 150–400)
Platelets: 260 10*3/uL (ref 150–400)
RBC: 4.05 MIL/uL — ABNORMAL LOW (ref 4.22–5.81)
RBC: 4.28 MIL/uL (ref 4.22–5.81)
RDW: 13.2 % (ref 11.5–15.5)
RDW: 13.1 % (ref 11.5–15.5)
WBC: 13.7 10*3/uL — ABNORMAL HIGH (ref 4.0–10.5)
WBC: 12.3 10*3/uL — ABNORMAL HIGH (ref 4.0–10.5)
nRBC: 0 % (ref 0.0–0.2)
nRBC: 0 % (ref 0.0–0.2)

## 2020-08-23 LAB — PHOSPHORUS: Phosphorus: 2.4 mg/dL — ABNORMAL LOW (ref 2.5–4.6)

## 2020-08-23 LAB — MAGNESIUM: Magnesium: 1.8 mg/dL (ref 1.7–2.4)

## 2020-08-23 MED ORDER — MAGNESIUM SULFATE 2 GM/50ML IV SOLN
2.0000 g | Freq: Once | INTRAVENOUS | Status: AC
  Administered 2020-08-23: 2 g via INTRAVENOUS
  Filled 2020-08-23: qty 50

## 2020-08-23 NOTE — Progress Notes (Signed)
PROGRESS NOTE    Jamie Wilson  PYK:[id number] DOB: [birth year] DOA: 08/16/2020 PCP: Patient, [name]   Brief Narrative:  HPI per Dr. Zada Finders on 08/16/20 Jamie Wilson is a 64 y.o. 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  Patient states he has a history of bowel obstruction for which partial resection was performed in January 2018 with subsequent colostomy placement.  He says he was in prison at the time of the surgery.  He has not had routine surgical follow-up since then.  He has had several days of worsening abdominal pain with nausea and vomiting.  He has had decreased output from his colostomy.  He had worsening emesis today and came to the ED for further evaluation.  He says while in the waiting area he vomited 4 more times.  He says he has a history of kidney stones and has been having bilateral flank pain.  He says he has to self cath for urine output and does have some discomfort when he urinates.  He also reports significant heartburn sensation and chest discomfort recently.  He says on arrival to the ED he was having significant trouble breathing which has now resolved since NG tube was placed.  He says he currently lives in a boardinghouse.  He is not in contact with close family.  He reports smoking about 5 cigars per day. He reports occasional alcohol use about once a week.  He denies any history of alcohol withdrawal.  He reports occasional marijuana use and denies any cocaine or IV drug use.  He says he is not taking any medications regularly.  He is not on any calcium or vitamin supplements.  ED Course:  Initial vitals showed BP 142/84, pulse 125, RR 30, temp 97.7 Fahrenheit, SPO2 92% on room air.  Labs significant for calcium 14.5, BUN 31, creatinine 1.48, sodium 144, potassium 4.0, bicarb 29, serum glucose 143, albumin 3.8,  LFTs within normal limits, WBC 16.7, hemoglobin 15.6, platelets 348,000, lactic acid 1.4, lipase 43, high-sensitivity troponin I 5 > 4.  FOBT is negative.  Blood cultures were obtained and pending.  SARS-CoV-2 PCR is negative.  Influenza A/B PCR are negative.  Portable chest x-ray showed right base atelectasis.  CT abdomen/pelvis with contrast shows small bowel herniation through the left lower quadrant ostomy defect causing high-grade small bowel obstruction.  Right nephrolithiasis without hydronephrosis seen as well as layering high density material in the posterior urinary bladder.  NG tube was placed in follow-up imaging showed enteric tube tip in the left upper quadrant consistent with location in the upper stomach.  EDP discussed with on-call general surgery team will evaluate in the morning.  Medical admission was requested and the hospitalist service was consulted to admit for further evaluation and management.  Patient was given 1 L LR, 4 mg IV Zofran, and 40 mg IV Protonix while in the ED.  **Interim History Patient was admitted for small bowel obstruction and incidentally his EKG showed mildly diffuse ST elevation so cardiology was consulted and felt that patient possibly has pericarditis.  General surgery recommending continuing the NG tube in place and will continue n.p.o. and supportive care for now.  NG tube is connected to suction and patient is less distended today but still complaining of abdominal soreness.  08/18/20: KUB showing some improvement however patient did not have any output from his ostomy yet  still.  Complaining of burning and discomfort in her urine and feels like his bladder spasming cardiology consulted for his diffuse ST elevations and echo showed an EF of 60 to 65% with normal diastolic function.  Cardiology recommending outpatient ischemic evaluation with NST versus coronary CTA if his heart rate improves and they will add aspirin and statin when tolerating  p.o..  08/19/20: Patient still has not had any output from his ostomy bag and urine culture is growing E. coli which is sensitive to ceftriaxone. Foley catheter will remain in place. NG tube in place and still putting out and KUB showing a nonobstructive bowel gas pattern and air throughout the colon that is unchanged. Because he has not had any improvement in the last 48 hours, general surgery considering a laparoscopic evaluation this afternoon to ensure that there is no mechanical issue noted. General surgery also added IV PPI due to concern for NG induced gastritis  08/20/2020: Patient did not actually go for his laparoscopic procedure as he started improving and started having gas in his colostomy bag.  This morning his NG tube is being clamped and planning on being removed by general surgery.  His diet was advanced to clears and is tolerating it with the tube clamped.  They are going to DC his NG tube today and continue clears and recommending continue IV PPI given his NG tube gastritis.  Patient continues to have significant air but not any stool in his colostomy bag today.  08/21/2020: Patient is tolerating clear liquid diet and advance to full liquid diet today.  He does have some swelling in his ostomy site.  We will continue IV fluid hydration for now and he is ambulating fairly well.  Continues to have air in his colostomy bag with no output of stool yet.  Possible remains significantly low still.  08/22/2020: Felt much better after his catheter change yesterday but feels like he still has a lot of sediment in it.  I discussed with him that we would flush it today.  General surgery feels that even though he is tolerating full liquid diet without abdominal pain they will recommend continuing full liquid diet and adding MiraLAX twice daily for now until he passes stool.  Therapy also can add Ensure and continue to mobilize the patient.  08/23/2020: Patient's catheter got obstructed yesterday and  because nursing was not there in time to help from patient took it upon himself to relieve the pressure caused by his catheter and pull out his Foley catheter himself.  BBC is still trending upwards.  Still continues to have air in his colostomy bag with no stool noted.  PT OT recommending no follow-up.  Continues to have some sediment in his Foley catheter but thinks is improving.  Assessment & Plan:   Principal Problem:   Small bowel obstruction (HCC) Active Problems:   Hypercalcemia   AKI (acute kidney injury) ([place])   Right nephrolithiasis  Small bowel obstruction secondary to herniation through ostomy,, slowly improving -General surgery consulted and patient now has an NG tube and hernia appears to be reduced. -NG tube in place, continue low intermittent suction for now and general surgery to reassess the tube later today -CT Abdomen showed "Small bowel herniation through the left lower quadrant the ostomy defect causing high-grade small bowel obstruction."  -Monitor strict I/O's -Keep n.p.o. and continue bowel rest; now has some ice chips -Repeat KUB in a.m. again; KUB this AM showed "Distended gas-filled loops of bowel throughout the abdomen  and upper pelvis, some component of which corresponds to the small-bowel obstruction demonstrated on earlier CT abdomen of 08/16/2020." -WBC Went from 16.7 -> 12.5 -> and normalized to 8.5 but started worsening again slightly and went to 11.0 -> 11.4 -> 11.3 ->11.2 and today is now 13.7 and repeat this afternoon was 12.3 -Antiemetics and analgesics as needed; continue with p.o./IV ondansetron 4 mg every 6 hours as needed nausea as well as 0.5 mg IV hydromorphone every 4 hours as needed severe pain -Continue with supportive care mobilize the patient.  Ensure potassium is greater than 4 and magnesium is greater than 2; mag is now 1.9 and potassium is 4.5 -He did not actually undergo a laparoscopic procedure the day before yesterday given that he started  to improve.  Diet has been advanced from clears to full's and general surgery recommending continuing FULL LIQUID DIET despite him having a lot of air from his ostomy as the again added MiraLAX twice daily and advance his diet once he passes some stool; diet has now been advanced to soft diet but surgery wants him to have some stool in his ostomy bag prior to discharging -We will obtain PT OT evaluation to have the patient ambulate and they have no further recommendations -Further care per general surgery  Hypophosphatemia -His phosphorus levels 2.4 -Treat with p.o. K-Phos Neutral -Continue to monitor and replete as necessary -Repeat Phos Level in the AM  NG induced Gastritis -There is Concern given that he had dark red-tinged output from his NG yesterday morning  -General surgery is adding IV PPI Pantoprazole 40 mg q12h -He continues concurrently with some heartburn -We will continue to monitor carefully  Hypercalcemia -Corrected calcium 14.7 on admission.  Last calcium 9.4 11/21/2019. -Start IV fluid hydration with NS@ 150 mL/hour and will continue for 16 hours and now that has stopped and IVF resumed at 75 mL/hr given that he is still npo -Started IV calcitonin 4 units/kg sq twice daily for max 4 doses and now stopped  -Give IV zoledronic acid 4 mg once now -PTH intact was 8, and [place] Total (PTH) was 11.8 -Strict urine output, bladder scan and in and out cath as needed -Repeat Labs improving and Ca2+ is now 9.5 and corrected for Albumin of 2.6 the Ca2+ is 9.7 -C/w IVF Hydration with normal saline at 75 MLS per hour and will reduce to 50 mL/hr  Metabolic Acidosis -Mild and improved now patient's CO2 is 23, chloride level is 106, anion gap is 7 -Continue IV fluid hydration with normal saline at 50 mils per hour the morning -Continue monitor and trend carefully and repeat CMP in a.m.  Acute Kidney Injury, improving  -Likely prerenal from SBO and GI losses.   -Patient's BUN/Cr went  from 31/1.48 -> 27/1.22 -> 24/1.11 -> 24/0.83 -> 16/0.79 -> 9/2.77 and yesterday it is 9/0.79 today it is 12/0.82 -He was given 1 L lactated Ringer's bolus and started on IV fluid hydration with normal saline at 150 MLS per hour for 16 hours and this expired so will renew IVF with NS at 75 mL/hr and reduce the rate to 50 MLS per hour -Continue IV fluid hydration as above and repeat labs in a.m. -Avoid nephrotoxic medications, contrast dyes, hypotension and renally dose medications -Continue monitor and trend renal function and repeat CMP in a.m.  Right Nephrolithiasis: -Seen on CT imaging without hydronephrosis.   -Small layering stones noted in the urinary bladder as well as abnormal calcifications within the lumen of  the bladder, may be along the thickened bladder wall per radiology report. -Continue IV fluids and analgesics as above -Will add Tamsulosin 0.4 mg Daily -Also will add B&O Suppositories  -U/A as below; continues to have Foley catheter sediment in his Foley was exchanged the day before yesterday and he actually removed his Foley catheter last night given that he got obstructed again -Continue with aggressive bladder flushing and if necessary will consult urology  BPH -Will place Foley Catheter as patient Self Cath's; prior to discharge we will discontinue Foley catheter and resume self catheterizations -Discussed with Urology Dr. Diona Fanti and recommended Foley for now -Will add Tamsulosin as above and continue full liquid diet  E Coli UTI, poA -In the setting of Self Cath and SBO -WBC on admission was 16.7 and trended down to 12.5 and could be in the setting of dehydration but is now trending back up and went to 13.7, however urinalysis showed a cloudy appearance with moderate hemoglobin, 5 ketones, moderate leukocytes, positive nitrites, 100 protein, greater than 1.046 specific gravity, few bacteria, greater than 50 RBCs per high-power field, and greater than 50 WBCs -Urine  culture still pending sensitivities but show >100,000 CFU of GNR x2; Susceptibilities showed E Coli with sensitivities to cefazolin, ceftriaxone, gentamicin, imipenem, and nitrofurantoin -Will empirically start antibiotics with IV Ceftriaxone 1 g every 24 hours and escalate/de-escalate as needed; today is day 7 of 7 -CTA showed "Right nephrolithiasis. No hydronephrosis. Layering high-density material posteriorly in the urinary bladder, likely small layering stones. Markedly thickened bladder wall abnormal calcifications seen within the lumen of the bladder, difficult to determine exact source and location. These could be along the thickened bladder wall. Aortic atherosclerosis. Hepatic and renal cysts."  Abnormal EKG with Concern for Pericarditis -Patient had an abnormal EKG on Admission with mild ST Elevation which was diffuse -Cardiology consulted and reviewed EKG and felt it not to be a STEMI but felt it was possible Pericarditis -High-sensitivity troponins were done and negative x2 as initial 1 was 5 and repeat was 4 -Cardiology recommending repeating EKG in the morning and possibly an echo for signs of pericardial effusion -Currently patient remains n.p.o. and is on bowel rest today have not initiated colchicine or NSAIDs for now -ECHO done and normal -Cardiology checked lipid panel showed a total cholesterol/HDL ratio of 2.7, cholesterol level of 105, HDL 39, LDL 51, triglycerides of 74, VLDL 15 -Cardiology planning on NST as an outpatient or Coronary CTA in the outpatient and will add ASA and Statin when taking po  -Cardiology recommending no NSAIDs or colchicine at this time given the patient is relatively asymptomatic and there have been to arrange outpatient cardiology follow-up for exertional chest pain with plans for stress testing on a nonemergent basis once he is clinically improved  Hyperglycemia in the setting of Prediabetes -Patient's blood sugar on admission was 143 and repeat this  morning was 114 -Check hemoglobin A1c to rule out diabetes and was 5.7 -Continue to monitor blood sugars carefully and if necessary will place on sensitive sliding scale insulin AC  Hypophosphatemia -Patient's Phos Level this AM was 2.4 -Replete again asa bove -Continue to Monitor and Replete as Necessary -Repeat Phos Level in the AM   Ostomy Swelling -Per General Surgery -General surgery PA evaluated and felt that is soft and felt his hernia was reducible and she was able to digitized his stoma will discuss with Dr. Harlow Asa about further recommendations  Hyponatremia -Mild with a sodium of 134 and is now  136 -Replete as above  -Continue monitor and trend -Repeat CMP in a.m.  Normocytic Anemia -? Dilutional Drop -Patient's Hgb/Hct went from 15.6/49.5 (Likely hemoconcentrated on admission) and trended down to 13.9/43.9 -> 12.2/39.6 -> 12.4/39.0 -> 11.5/37.1 -> 11.4/36.0 -> 11.1/34.9 -> 12.0/37.2 -> 12.7/39.9 and is likely from a dilutional drop given that his fluids have been renewed -Check Anemia Panel and showed an iron level of 25, U IBC of 222, TIBC of 247, saturation ratios of 10%, ferritin level 156, folate level 6.3, vitamin B12 118 -We will not give IV iron supplementation given his urinary tract infection -Continue to Monitor for S/Sx of Bleeding; Currently no overt bleeding noted -Repeat CBC in the AM  Sinus Tachycardia, improved -Likely physiological response in relation to his small bowel obstruction and likely UTI as well as flank pain from nephrolithiasis -Cardiology recommending to continue to monitor on telemetry for now   DVT prophylaxis: SCDs; will add Heparin 5,000 units sq q8h Code Status: FULL CODE Family Communication: None Disposition Plan: Pending further clinical improvement back to baseline and tolerance of p.o. diet and clearance by cardiology and general surgery  Status is: Inpatient  Remains inpatient appropriate because:Unsafe d/c plan, IV treatments  appropriate due to intensity of illness or inability to take PO and Inpatient level of care appropriate due to severity of illness   Dispo: The patient is from: Home              Anticipated d/c is to: Home              Anticipated d/c date is: 1-2 days when he is able to tolerate p.o and cleared by General Surgery.              Patient currently is not medically stable to d/c.  Consultants:   General Surgery  Cardiology  Discussed Case with Urology   Procedures: NGT insertion; possible Laparoscopy   Antimicrobials:  Anti-infectives (From admission, onward)   Start     Dose/Rate Route Frequency Ordered Stop   08/17/20 0930  cefTRIAXone (ROCEPHIN) 1 g in sodium chloride 0.9 % 100 mL IVPB        1 g 200 mL/hr over 30 Minutes Intravenous Every 24 hours 08/17/20 0856         Subjective: Seen and examined at bedside and states that he had a rough night and pulled out his Foley catheter given the abdominal pressure that he had from his blocked Foley.  He states that he pulled out his catheter and was able to urinate and wants his catheter was reinserted he had immediate 800 mL out.  Not having lower abdominal pain or cramping today and thinks he got less sediment. No other concerns and has air in his ostomy and no stool and minimal liquid.   Objective: Vitals:   08/22/20 2231 08/23/20 0500 08/23/20 0600 08/23/20 1305  BP: 108/74  (!) 107/58 90/64  Pulse: 82  83 89  Resp: 16  18 18   Temp: 98.8 F (37.1 C)  98.5 F (36.9 C) 97.8 F (36.6 C)  TempSrc:    Oral  SpO2: 97%  95% 94%  Weight:  61.2 kg    Height:        Intake/Output Summary (Last 24 hours) at 08/23/2020 1706 Last data filed at 08/23/2020 1500 Gross per 24 hour  Intake 1489.91 ml  Output 5425 ml  Net -3935.09 ml   Filed Weights   08/17/20 0147 08/20/20 0458 08/23/20 0500  Weight:  68 kg 59.5 kg 61.2 kg   Examination: Physical Exam:  Constitutional: Patient is a thin slightly disheveled Caucasian male  currently no acute distress appears calm and does appear comfortable today Eyes: Lids and conjunctivae normal, sclerae anicteric  ENMT: External Ears, Nose appear normal. Grossly normal hearing. Neck: Appears normal, supple, no cervical masses, normal ROM, no appreciable thyromegaly; no JVD Respiratory: Diminished to auscultation bilaterally, no wheezing, rales, rhonchi or crackles. Normal respiratory effort and patient is not tachypenic. No accessory muscle use.  Unlabored breathing Cardiovascular: RRR, no murmurs / rubs / gallops. S1 and S2 auscultated. No extremity edema. Abdomen: Soft, slightly tender, distended slightly.  Has ostomy swelling and his ostomy has air in it but no fecal output. Bowel sounds positive.  GU: Deferred.  Foley catheter in place draining yellow color urine with minimal sediment Musculoskeletal: No clubbing / cyanosis of digits/nails. No joint deformity upper and lower extremities. Skin: No rashes, lesions, ulcers. No induration; Warm and dry.  Neurologic: CN 2-12 grossly intact with no focal deficits. Romberg sign and cerebellar reflexes not assessed.  Psychiatric: Normal judgment and insight. Alert and oriented x 3. Normal mood and appropriate affect.    Data Reviewed: I have personally reviewed following labs and imaging studies  CBC: Recent Labs  Lab 08/20/20 0519 08/21/20 0525 08/22/20 0431 08/23/20 0425 08/23/20 1357  WBC 11.4* 11.3* 11.2* 13.7* 12.3*  NEUTROABS 7.3 5.3 4.7 7.1 5.6  HGB 11.5* 11.4* 11.1* 12.0* 12.7*  HCT 37.1* 36.0* 34.9* 37.2* 39.9  MCV 95.9 93.5 92.6 91.9 93.2  PLT 206 219 217 247 297   Basic Metabolic Panel: Recent Labs  Lab 08/19/20 0359 08/20/20 0519 08/21/20 0525 08/22/20 0431 08/23/20 0425  NA 142 136 134* 134* 136  K 3.7 3.9 4.2 4.5 5.0  CL 109 107 105 107 106  CO2 24 21* 20* 23 23  GLUCOSE 101* 111* 90 100* 114*  BUN 24* 16 9 9 12   CREATININE 0.83 0.79 0.77 0.79 0.82  CALCIUM 8.8* 8.4* 7.9* 8.5* 9.5  MG 2.0 1.7  2.1 1.9 1.8  PHOS 1.2* 1.1* 1.2* 1.9* 2.4*   GFR: Estimated Creatinine Clearance: 77.7 mL/min (by C-G formula based on SCr of 0.82 mg/dL). Liver Function Tests: Recent Labs  Lab 08/19/20 0359 08/20/20 0519 08/21/20 0525 08/22/20 0431 08/23/20 0425  AST 12* 13* 11* 14* 17  ALT 11 8 9 12 18   ALKPHOS 60 56 62 60 69  BILITOT 0.6 0.4 0.4 0.4 0.5  PROT 6.7 6.0* 6.0* 6.0* 6.4*  ALBUMIN 2.8* 2.5* 2.5* 2.5* 2.6*   Recent Labs  Lab 08/16/20 1955  LIPASE 43   No results for input(s): AMMONIA in the last 168 hours. Coagulation Profile: Recent Labs  Lab 08/16/20 1955  INR 1.1   Cardiac Enzymes: No results for input(s): CKTOTAL, CKMB, CKMBINDEX, TROPONINI in the last 168 hours. BNP (last 3 results) No results for input(s): PROBNP in the last 8760 hours. HbA1C: No results for input(s): HGBA1C in the last 72 hours. CBG: No results for input(s): GLUCAP in the last 168 hours. Lipid Profile: No results for input(s): CHOL, HDL, LDLCALC, TRIG, CHOLHDL, LDLDIRECT in the last 72 hours. Thyroid Function Tests: No results for input(s): TSH, T4TOTAL, FREET4, T3FREE, THYROIDAB in the last 72 hours. Anemia Panel: No results for input(s): VITAMINB12, FOLATE, FERRITIN, TIBC, IRON, RETICCTPCT in the last 72 hours. Sepsis Labs: Recent Labs  Lab 08/16/20 1955  LATICACIDVEN 1.4    Recent Results (from the past 240 hour(s))  Blood  Culture (routine x 2)     Status: None   Collection Time: 08/16/20  7:55 PM   Specimen: BLOOD  Result Value Ref Range Status   Specimen Description   Final    BLOOD RIGHT ANTECUBITAL Performed at [facility] [address] [place], [place] [address]    Special Requests   Final    BOTTLES DRAWN AEROBIC AND ANAEROBIC Blood Culture adequate volume Performed at [facility] [address], [place], [place] [address]    Culture   Final    NO GROWTH 5 DAYS Performed at [facility], [place] [address] [place],  [place] [address]    Report Status 08/21/2020 FINAL  Final  Blood Culture (routine x 2)     Status: None   Collection Time: 08/16/20  8:00 PM   Specimen: BLOOD  Result Value Ref Range Status   Specimen Description   Final    BLOOD LEFT ANTECUBITAL Performed at [facility] [address] [place], [place] [address]    Special Requests   Final    BOTTLES DRAWN AEROBIC AND ANAEROBIC Blood Culture adequate volume Performed at [facility] [address], [place], [place] [address]    Culture   Final    NO GROWTH 5 DAYS Performed at [facility], [place] [address], [place], [place] [address]    Report Status 08/21/2020 FINAL  Final  Respiratory Panel by RT PCR (Flu A&B, Covid) - Nasopharyngeal Swab     Status: None   Collection Time: 08/16/20  8:15 PM   Specimen: Nasopharyngeal Swab  Result Value Ref Range Status   SARS Coronavirus 2 by RT PCR NEGATIVE NEGATIVE Final    Comment: (NOTE) SARS-CoV-2 target nucleic acids are NOT DETECTED.  The SARS-CoV-2 RNA is generally detectable in upper respiratoy specimens during the acute phase of infection. The lowest concentration of SARS-CoV-2 viral copies this assay can detect is 131 copies/mL. A negative result does not preclude SARS-Cov-2 infection and should not be used as the sole basis for treatment or other patient management decisions. A negative result may occur with  improper specimen collection/handling, submission of specimen other than nasopharyngeal swab, presence of viral mutation(s) within the areas targeted by this assay, and inadequate number of viral copies (<131 copies/mL). A negative result must be combined with clinical observations, patient history, and epidemiological information. The expected result is Negative.  Fact Sheet for Patients:  PinkCheek.be  Fact Sheet for Healthcare Providers:  GravelBags.it  This test is no t  yet approved or cleared by the [place] FDA and  has been authorized for detection and/or diagnosis of SARS-CoV-2 by FDA under an Emergency Use Authorization (EUA). This EUA will remain  in effect (meaning this test can be used) for the duration of the COVID-19 declaration under Section 564(b)(1) of the Act, 21 U.S.C. section 360bbb-3(b)(1), unless the authorization is terminated or revoked sooner.     Influenza A by PCR NEGATIVE NEGATIVE Final   Influenza B by PCR NEGATIVE NEGATIVE Final    Comment: (NOTE) The Xpert Xpress SARS-CoV-2/FLU/RSV assay is intended as an aid in  the diagnosis of influenza from Nasopharyngeal swab specimens and  should not be used as a sole basis for treatment. Nasal washings and  aspirates are unacceptable for Xpert Xpress SARS-CoV-2/FLU/RSV  testing.  Fact Sheet for Patients: PinkCheek.be  Fact Sheet for Healthcare Providers: GravelBags.it  This test is not yet approved or cleared by  the [employer] and  has been authorized for detection and/or diagnosis of SARS-CoV-2 by  FDA under an Emergency Use Authorization (EUA). This EUA will remain  in effect (meaning this test can be used) for the duration of the  Covid-19 declaration under Section 564(b)(1) of the Act, 21  U.S.C. section 360bbb-3(b)(1), unless the authorization is  terminated or revoked. Performed at [facility], [place] [address], [place], [place] [address]   Urine culture     Status: Abnormal   Collection Time: 08/17/20  2:00 AM   Specimen: In/Out Cath Urine  Result Value Ref Range Status   Specimen Description   Final    IN/OUT CATH URINE Performed at [place] [address], [place], [place] [address]    Special Requests   Final    NONE Performed at [facility], [place] [address], [place], [place] [address]    Culture >=100,000 COLONIES/mL ESCHERICHIA  COLI (A)  Final   Report Status 08/19/2020 FINAL  Final   Organism ID, Bacteria ESCHERICHIA COLI (A)  Final      Susceptibility   Escherichia coli - MIC*    AMPICILLIN >=32 RESISTANT Resistant     CEFAZOLIN 16 SENSITIVE Sensitive     CEFTRIAXONE <=0.25 SENSITIVE Sensitive     CIPROFLOXACIN >=4 RESISTANT Resistant     GENTAMICIN <=1 SENSITIVE Sensitive     IMIPENEM <=0.25 SENSITIVE Sensitive     NITROFURANTOIN <=16 SENSITIVE Sensitive     TRIMETH/SULFA >=320 RESISTANT Resistant     AMPICILLIN/SULBACTAM >=32 RESISTANT Resistant     PIP/TAZO <=4 SENSITIVE Sensitive     * >=100,000 COLONIES/mL ESCHERICHIA COLI  Culture, Urine     Status: Abnormal   Collection Time: 08/17/20  9:50 AM   Specimen: Urine, Random  Result Value Ref Range Status   Specimen Description   Final    URINE, RANDOM Performed at [place] [address], [place], [place] [address]    Special Requests   Final    NONE Performed at [facility], [place] [address], [place], [place] [address]    Culture >=100,000 COLONIES/mL ESCHERICHIA COLI (A)  Final   Report Status 08/19/2020 FINAL  Final   Organism ID, Bacteria ESCHERICHIA COLI (A)  Final      Susceptibility   Escherichia coli - MIC*    AMPICILLIN >=32 RESISTANT Resistant     CEFAZOLIN 16 SENSITIVE Sensitive     CEFTRIAXONE <=0.25 SENSITIVE Sensitive     CIPROFLOXACIN >=4 RESISTANT Resistant     GENTAMICIN <=1 SENSITIVE Sensitive     IMIPENEM <=0.25 SENSITIVE Sensitive     NITROFURANTOIN <=16 SENSITIVE Sensitive     TRIMETH/SULFA >=320 RESISTANT Resistant     AMPICILLIN/SULBACTAM >=32 RESISTANT Resistant     PIP/TAZO 64 INTERMEDIATE Intermediate     * >=100,000 COLONIES/mL ESCHERICHIA COLI    RN Pressure Injury Documentation:     Estimated body mass index is 17.8 kg/m as calculated from the following:   Height as of this encounter: 6\' 1"  (1.854 m).   Weight as of this encounter: 61.2 kg.  Malnutrition Type:       Malnutrition Characteristics:      Nutrition Interventions:    Radiology Studies: No results found. Scheduled Meds: . Chlorhexidine Gluconate Cloth  6 each Topical Daily  . feeding supplement  237 mL Oral BID BM  . heparin injection (subcutaneous)  5,000 Units Subcutaneous Q8H  . pantoprazole (PROTONIX) IV  40 mg Intravenous Q12H  . polyethylene glycol  17 g Oral BID  . tamsulosin  0.4 mg Oral Daily   Continuous Infusions: . sodium chloride 50 mL/hr at 08/22/20 1329  . cefTRIAXone (ROCEPHIN)  IV 1 g (08/23/20 0947)    LOS: 7 days   Kerney Elbe, DO [facility] PAGER is on [place]  If 7PM-7AM, please contact night-coverage [email or web address]

## 2020-08-23 NOTE — Progress Notes (Addendum)
[facility] Progress Note     Subjective: CC-  Issues with catheter last night. States that it got clogged and would not drain his bladder so he took it out himself and was able to urinate. At that time he was having severe suprapubic pain, this has since resolved. Denies any nausea or vomiting. Tolerating all of his full liquid trays, Ensure, and more. Continues to have air from ostomy, no stool. Emptied gas from pouch 3 times last night.  Objective: Vital signs in last 24 hours: Temp:  [98.2 F (36.8 C)-98.8 F (37.1 C)] 98.5 F (36.9 C) (10/26 0600) Pulse Rate:  [77-83] 83 (10/26 0600) Resp:  [16-18] 18 (10/26 0600) BP: (96-108)/(58-74) 107/58 (10/26 0600) SpO2:  [95 %-97 %] 95 % (10/26 0600) Weight:  [61.2 kg] 61.2 kg (10/26 0500)    Intake/Output from previous day: 10/25 0701 - 10/26 0700 In: 2209.9 [P.O.:1200; I.V.:573.9; IV Piggyback:436.1] Out: 6125 [Urine:6125] Intake/Output this shift: No intake/output data recorded.  PE: Gen: Alert, NAD Pulm: rate and effort normal Abd: Soft, ND,nontender, +BS,ostomy viable/ air and 1tsp yellow liquid in pouch/ parastomal hernia soft, reducible and nontender Skin: no rashes noted, warm and dry    Lab Results:  Recent Labs    08/22/20 0431 08/23/20 0425  WBC 11.2* 13.7*  HGB 11.1* 12.0*  HCT 34.9* 37.2*  PLT 217 247   BMET Recent Labs    08/22/20 0431 08/23/20 0425  NA 134* 136  K 4.5 5.0  CL 107 106  CO2 23 23  GLUCOSE 100* 114*  BUN 9 12  CREATININE 0.79 0.82  CALCIUM 8.5* 9.5   PT/INR No results for input(s): LABPROT, INR in the last 72 hours. CMP     Component Value Date/Time   NA 136 08/23/2020 0425   K 5.0 08/23/2020 0425   CL 106 08/23/2020 0425   CO2 23 08/23/2020 0425   GLUCOSE 114 (H) 08/23/2020 0425   BUN 12 08/23/2020 0425   CREATININE 0.82 08/23/2020 0425   CALCIUM 9.5 08/23/2020 0425   CALCIUM 11.8 (H) 08/17/2020 0430   PROT 6.4 (L) 08/23/2020 0425   ALBUMIN 2.6 (L)  08/23/2020 0425   AST 17 08/23/2020 0425   ALT 18 08/23/2020 0425   ALKPHOS 69 08/23/2020 0425   BILITOT 0.5 08/23/2020 0425   GFRNONAA >60 08/23/2020 0425   GFRAA >60 11/21/2019 1524   Lipase     Component Value Date/Time   LIPASE 43 08/16/2020 1955       Studies/Results: No results found.  Anti-infectives: Anti-infectives (From admission, onward)   Start     Dose/Rate Route Frequency Ordered Stop   08/17/20 0930  cefTRIAXone (ROCEPHIN) 1 g in sodium chloride 0.9 % 100 mL IVPB        1 g 200 mL/hr over 30 Minutes Intravenous Every 24 hours 08/17/20 0856         Assessment/Plan Chest pain, abnormal EKG - cardiology following  Tobacco abuse UTI Nephrolithiasis BPH - patient self-caths, foley placed and patient removed this last night Hypercalcemia - resolved Hypophosphatemia  AKI - resolved Right nephrolithiasis E coli UTI  SBO related to ostomy -Continues to pass flatus but no stool. 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 He is tolerating full liquids. Advance to soft diet. Continue bowel regimen. Mobilize.  ID -rocephin 10/20>> FEN -IVF, soft diet, Ensure VTE -SCDs, ok for chemical DVT prophylaxis from surgical standpint Foley -patient removed 10/25 Follow up -TBD    LOS: 7 days  Jamie Wilson, [facility] 08/23/2020, 7:55 AM Please see Amion for pager number during day hours 7:00am-4:30pm  [facility]  Agree with above note by Jamie Billet, PA-C. Advance diet and monitor ostomy function.  Jamie Gemma, MD [facility] Office: [phone]

## 2020-08-23 NOTE — Progress Notes (Addendum)
Physical Therapy Treatment Patient Details Name: Jamie Wilson MRN: [id number] DOB: Jul 03, 1956 Today's Date: 08/23/2020    History of Present Illness 64 yo male admitted with SBO. PMH of colostomy.     PT Comments    Pt is progressing well with mobility, he ambulated 350' holding IV pole, no loss of balance. Pt would benefit from daily ambulation with nursing.  Follow Up Recommendations  No PT follow up     Equipment Recommendations  None recommended by PT    Recommendations for Other Services       Precautions / Restrictions Precautions Precautions: Fall Restrictions Weight Bearing Restrictions: No    Mobility  Bed Mobility Overal bed mobility: Modified Independent             General bed mobility comments: used rail  Transfers Overall transfer level: Modified independent   Transfers: Sit to/from Stand Sit to Stand: Modified independent (Device/Increase time)         General transfer comment: used bedrail to push up  Ambulation/Gait Ambulation/Gait assistance: Min guard Gait Distance (Feet): 350 Feet Assistive device: IV Pole Gait Pattern/deviations: Step-through pattern;Decreased stride length Gait velocity: WFL   General Gait Details: Pt tolerated distance well. Mild unsteadiness x 1, pt able to self correct.   Stairs             Wheelchair Mobility    Modified Rankin (Stroke Patients Only)       Balance Overall balance assessment: Modified Independent                                          Cognition Arousal/Alertness: Awake/alert Behavior During Therapy: WFL for tasks assessed/performed Overall Cognitive Status: Within Functional Limits for tasks assessed                                        Exercises      General Comments        Pertinent Vitals/Pain Pain Assessment: No/denies pain    Home Living                      Prior Function            PT Goals (current  goals can now be found in the care plan section) Acute Rehab PT Goals PT Goal Formulation: With patient Time For Goal Achievement: 09/03/20 Potential to Achieve Goals: Good Progress towards PT goals: Progressing toward goals    Frequency    Min 3X/week      PT Plan Current plan remains appropriate    Co-evaluation              AM-PAC PT "6 Clicks" Mobility   Outcome Measure  Help needed turning from your back to your side while in a flat bed without using bedrails?: None Help needed moving from lying on your back to sitting on the side of a flat bed without using bedrails?: None Help needed moving to and from a bed to a chair (including a wheelchair)?: None Help needed standing up from a chair using your arms (e.g., wheelchair or bedside chair)?: None Help needed to walk in hospital room?: A Little Help needed climbing 3-5 steps with a railing? : A Little 6 Click Score: 22    End of Session  Activity Tolerance: Patient tolerated treatment well Patient left: with call bell/phone within reach;with bed alarm set;in bed Nurse Communication: Mobility status PT Visit Diagnosis: Unsteadiness on feet (R26.81)     Time: 4917-9150 PT Time Calculation (min) (ACUTE ONLY): 19 min  Charges:  $Gait Training: 8-22 mins                     Blondell Reveal Kistler PT 08/23/2020  Acute Rehabilitation Services Pager [phone] Office [phone]

## 2020-08-23 NOTE — TOC Progression Note (Signed)
Transition of Care [facility]) - Progression Note    Patient Details  Name: Jamie Wilson MRN: [id number] Date of Birth: 10-13-1956  [facility]) CM/SW Contact  Purcell Mouton, RN Phone Number: 08/23/2020, 10:13 AM  Clinical Narrative:     Spoke with pt concerning discharge. Pt states he is not ready to go home, the people are dirty. Pt continues to state that he has chucks in his foley cath.   Expected Discharge Plan: Group Home Barriers to Discharge: No Barriers Identified  Expected Discharge Plan and Services Expected Discharge Plan: Group Home       Living arrangements for the past 2 months: Group Home                                       Social Determinants of Health (SDOH) Interventions    Readmission Risk Interventions No flowsheet data found.

## 2020-08-24 ENCOUNTER — Inpatient Hospital Stay (HOSPITAL_COMMUNITY): Payer: Medicaid Other

## 2020-08-24 DIAGNOSIS — K56609 Unspecified intestinal obstruction, unspecified as to partial versus complete obstruction: Secondary | ICD-10-CM | POA: Diagnosis not present

## 2020-08-24 LAB — COMPREHENSIVE METABOLIC PANEL
ALT: 23 U/L (ref 0–44)
AST: 23 U/L (ref 15–41)
Albumin: 2.8 g/dL — ABNORMAL LOW (ref 3.5–5.0)
Alkaline Phosphatase: 76 U/L (ref 38–126)
Anion gap: 10 (ref 5–15)
BUN: 12 mg/dL (ref 8–23)
CO2: 22 mmol/L (ref 22–32)
Calcium: 10 mg/dL (ref 8.9–10.3)
Chloride: 104 mmol/L (ref 98–111)
Creatinine, Ser: 0.95 mg/dL (ref 0.61–1.24)
GFR, Estimated: >60 mL/min (ref >60)
Glucose, Bld: 118 mg/dL — ABNORMAL HIGH (ref 70–99)
Potassium: 4.6 mmol/L (ref 3.5–5.1)
Sodium: 136 mmol/L (ref 135–145)
Total Bilirubin: 0.3 mg/dL (ref 0.3–1.2)
Total Protein: 6.6 g/dL (ref 6.5–8.1)

## 2020-08-24 LAB — CBC WITH DIFFERENTIAL/PLATELET
Abs Immature Granulocytes: 0.02 10*3/uL (ref 0.00–0.07)
Basophils Absolute: 0.1 10*3/uL (ref 0.0–0.1)
Basophils Relative: 1 %
Eosinophils Absolute: 4.4 10*3/uL — ABNORMAL HIGH (ref 0.0–0.5)
Eosinophils Relative: 35 %
HCT: 38.1 % — ABNORMAL LOW (ref 39.0–52.0)
Hemoglobin: 12 g/dL — ABNORMAL LOW (ref 13.0–17.0)
Immature Granulocytes: 0 %
Lymphocytes Relative: 12 %
Lymphs Abs: 1.6 10*3/uL (ref 0.7–4.0)
MCH: 29.4 pg (ref 26.0–34.0)
MCHC: 31.5 g/dL (ref 30.0–36.0)
MCV: 93.4 fL (ref 80.0–100.0)
Monocytes Absolute: 0.8 10*3/uL (ref 0.1–1.0)
Monocytes Relative: 6 %
Neutro Abs: 5.7 10*3/uL (ref 1.7–7.7)
Neutrophils Relative %: 46 %
Platelets: 268 10*3/uL (ref 150–400)
RBC: 4.08 MIL/uL — ABNORMAL LOW (ref 4.22–5.81)
RDW: 13.1 % (ref 11.5–15.5)
WBC: 12.6 10*3/uL — ABNORMAL HIGH (ref 4.0–10.5)
nRBC: 0 % (ref 0.0–0.2)

## 2020-08-24 LAB — MAGNESIUM: Magnesium: 2 mg/dL (ref 1.7–2.4)

## 2020-08-24 LAB — PATHOLOGIST SMEAR REVIEW

## 2020-08-24 LAB — PHOSPHORUS: Phosphorus: 2.3 mg/dL — ABNORMAL LOW (ref 2.5–4.6)

## 2020-08-24 MED ORDER — SORBITOL 70 % SOLN
960.0000 mL | TOPICAL_OIL | Freq: Once | ORAL | Status: AC
  Administered 2020-08-24: 960 mL via RECTAL
  Filled 2020-08-24: qty 473

## 2020-08-24 NOTE — Progress Notes (Signed)
PROGRESS NOTE    Jamie Wilson  XAJ:[id number] DOB: 30-Oct-1955 DOA: 08/16/2020 PCP: Patient, [name]    Brief Narrative:  64 year old gentleman with history of bowel obstruction status post partial bowel resection and colostomy presented to the ER with nausea vomiting and abdominal pain.  Also has history of urinary obstruction and was doing intermittent self cath at home.  Lives in a boardinghouse. In the emergency room hemodynamically stable.  A CT scan showed a small bowel herniation through the left lower quadrant ostomy defect causing high-grade small bowel obstruction.  Right nephrolithiasis without hydronephrosis.  NGT was placed with improvement of symptoms.   Assessment & Plan:   Principal Problem:   Small bowel obstruction (HCC) Active Problems:   Hypercalcemia   AKI (acute kidney injury) ([place])   Right nephrolithiasis  Small bowel obstruction secondary herniation through ostomy: Treated conservatively.  Followed by surgery. Some clinical improvement, still no bowel movement in his colostomy.  He does have flatus present. Denies nausea vomiting. Repeat KUB with improvement. Stenosed colostomy outlet, surgery recommending enema today. Continue to mobilize and monitor.  Acute kidney injury, chronic recurrent urinary obstruction: Patient with history of BPH and intermittent self cath.  Recurrent urinary retention. Second Foley catheter in the hospital. Continue Flomax, discharged with Foley catheter with outpatient follow-up with urology for voiding trial.  E. coli UTI present on admission due to intermittent self-catheterization: Symptomatically improving.  Rocephin received for 8 days.  Will discontinue.  Abnormal EKG concern for pericarditis: Seen by cardiology.  Improved.  Electrolyte abnormalities including hypophosphatemia: All replaced and normalized.   DVT prophylaxis: heparin injection 5,000 Units Start: 08/18/20 1400 SCDs Start: 08/17/20 0001   Code  Status: Full code Family Communication: None Disposition Plan: Status is: Inpatient  Remains inpatient appropriate because:Inpatient level of care appropriate due to severity of illness   Dispo: The patient is from: Group home              Anticipated d/c is to: Group home              Anticipated d/c date is: 2 days              Patient currently is not medically stable to d/c.         Consultants:   General surgery  Procedures:   None  Antimicrobials:   Rocephin, 10/20--- 10/27   Subjective: Patient seen and examined.  Mild pain present.  He is more concerned about his living situation and wants [place] to make a call so they can give him a private room in the boardinghouse. Urinating well now after Foley catheter placement. Minimal abdominal discomfort. Complains of only air but no stool in his colostomy.  No nausea or vomiting.  Tolerating liquid diet.  Objective: Vitals:   08/23/20 2050 08/24/20 0427 08/24/20 0500 08/24/20 0900  BP: 124/67 (!) 87/60  104/70  Pulse: 88 83  97  Resp: 18 18    Temp: 97.9 F (36.6 C) 98.1 F (36.7 C)    TempSrc: Oral Oral    SpO2: 96% 95%    Weight:   59 kg   Height:        Intake/Output Summary (Last 24 hours) at 08/24/2020 1356 Last data filed at 08/24/2020 1100 Gross per 24 hour  Intake 820 ml  Output 4800 ml  Net -3980 ml   Filed Weights   08/20/20 0458 08/23/20 0500 08/24/20 0500  Weight: 59.5 kg 61.2 kg 59 kg  Examination:  General exam: Appears calm and comfortable  Talkative.  On room air.  Not in any distress. Respiratory system: Clear to auscultation. Respiratory effort normal. Cardiovascular system: S1 & S2 heard, RRR. No JVD, murmurs, rubs, gallops or clicks. No pedal edema. Gastrointestinal system: Mildly distended around the stoma. Nontender and no rigidity or guarding. Colostomy present, small amount of liquid present on the colostomy bag but no stool.  Bowel sounds present. Central nervous system:  Alert and oriented. No focal neurological deficits. Extremities: Symmetric 5 x 5 power. Skin: No rashes, lesions or ulcers Psychiatry: Judgement and insight appear normal. Mood & affect appropriate.     Data Reviewed: I have personally reviewed following labs and imaging studies  CBC: Recent Labs  Lab 08/21/20 0525 08/22/20 0431 08/23/20 0425 08/23/20 1357 08/24/20 0411  WBC 11.3* 11.2* 13.7* 12.3* 12.6*  NEUTROABS 5.3 4.7 7.1 5.6 5.7  HGB 11.4* 11.1* 12.0* 12.7* 12.0*  HCT 36.0* 34.9* 37.2* 39.9 38.1*  MCV 93.5 92.6 91.9 93.2 93.4  PLT 219 217 247 260 341   Basic Metabolic Panel: Recent Labs  Lab 08/20/20 0519 08/21/20 0525 08/22/20 0431 08/23/20 0425 08/24/20 0411  NA 136 134* 134* 136 136  K 3.9 4.2 4.5 5.0 4.6  CL 107 105 107 106 104  CO2 21* 20* 23 23 22   GLUCOSE 111* 90 100* 114* 118*  BUN 16 9 9 12 12   CREATININE 0.79 0.77 0.79 0.82 0.95  CALCIUM 8.4* 7.9* 8.5* 9.5 10.0  MG 1.7 2.1 1.9 1.8 2.0  PHOS 1.1* 1.2* 1.9* 2.4* 2.3*   GFR: Estimated Creatinine Clearance: 64.7 mL/min (by C-G formula based on SCr of 0.95 mg/dL). Liver Function Tests: Recent Labs  Lab 08/20/20 0519 08/21/20 0525 08/22/20 0431 08/23/20 0425 08/24/20 0411  AST 13* 11* 14* 17 23  ALT 8 9 12 18 23   ALKPHOS 56 62 60 69 76  BILITOT 0.4 0.4 0.4 0.5 0.3  PROT 6.0* 6.0* 6.0* 6.4* 6.6  ALBUMIN 2.5* 2.5* 2.5* 2.6* 2.8*   No results for input(s): LIPASE, AMYLASE in the last 168 hours. No results for input(s): AMMONIA in the last 168 hours. Coagulation Profile: No results for input(s): INR, PROTIME in the last 168 hours. Cardiac Enzymes: No results for input(s): CKTOTAL, CKMB, CKMBINDEX, TROPONINI in the last 168 hours. BNP (last 3 results) No results for input(s): PROBNP in the last 8760 hours. HbA1C: No results for input(s): HGBA1C in the last 72 hours. CBG: No results for input(s): GLUCAP in the last 168 hours. Lipid Profile: No results for input(s): CHOL, HDL, LDLCALC, TRIG,  CHOLHDL, LDLDIRECT in the last 72 hours. Thyroid Function Tests: No results for input(s): TSH, T4TOTAL, FREET4, T3FREE, THYROIDAB in the last 72 hours. Anemia Panel: No results for input(s): VITAMINB12, FOLATE, FERRITIN, TIBC, IRON, RETICCTPCT in the last 72 hours. Sepsis Labs: No results for input(s): PROCALCITON, LATICACIDVEN in the last 168 hours.  Recent Results (from the past 240 hour(s))  Blood Culture (routine x 2)     Status: None   Collection Time: 08/16/20  7:55 PM   Specimen: BLOOD  Result Value Ref Range Status   Specimen Description   Final    BLOOD RIGHT ANTECUBITAL Performed at [facility] [address], [place], [place] [address]    Special Requests   Final    BOTTLES DRAWN AEROBIC AND ANAEROBIC Blood Culture adequate volume Performed at [facility] [address] [place], [place] [address]    Culture  Final    NO GROWTH 5 DAYS Performed at [facility], [place] [address], [place], [place] [address]    Report Status 08/21/2020 FINAL  Final  Blood Culture (routine x 2)     Status: None   Collection Time: 08/16/20  8:00 PM   Specimen: BLOOD  Result Value Ref Range Status   Specimen Description   Final    BLOOD LEFT ANTECUBITAL Performed at [facility] [address], [place], [place] [address]    Special Requests   Final    BOTTLES DRAWN AEROBIC AND ANAEROBIC Blood Culture adequate volume Performed at [facility] [address] [place], [place] [address]    Culture   Final    NO GROWTH 5 DAYS Performed at [facility], [place] [address] [place], [place] [address]    Report Status 08/21/2020 FINAL  Final  Respiratory Panel by RT PCR (Flu A&B, Covid) - Nasopharyngeal Swab     Status: None   Collection Time: 08/16/20  8:15 PM   Specimen: Nasopharyngeal Swab  Result Value Ref Range Status   SARS Coronavirus 2 by RT PCR NEGATIVE NEGATIVE Final    Comment:  (NOTE) SARS-CoV-2 target nucleic acids are NOT DETECTED.  The SARS-CoV-2 RNA is generally detectable in upper respiratoy specimens during the acute phase of infection. The lowest concentration of SARS-CoV-2 viral copies this assay can detect is 131 copies/mL. A negative result does not preclude SARS-Cov-2 infection and should not be used as the sole basis for treatment or other patient management decisions. A negative result may occur with  improper specimen collection/handling, submission of specimen other than nasopharyngeal swab, presence of viral mutation(s) within the areas targeted by this assay, and inadequate number of viral copies (<131 copies/mL). A negative result must be combined with clinical observations, patient history, and epidemiological information. The expected result is Negative.  Fact Sheet for Patients:  PinkCheek.be  Fact Sheet for Healthcare Providers:  GravelBags.it  This test is no t yet approved or cleared by the [place] FDA and  has been authorized for detection and/or diagnosis of SARS-CoV-2 by FDA under an Emergency Use Authorization (EUA). This EUA will remain  in effect (meaning this test can be used) for the duration of the COVID-19 declaration under Section 564(b)(1) of the Act, 21 U.S.C. section 360bbb-3(b)(1), unless the authorization is terminated or revoked sooner.     Influenza A by PCR NEGATIVE NEGATIVE Final   Influenza B by PCR NEGATIVE NEGATIVE Final    Comment: (NOTE) The Xpert Xpress SARS-CoV-2/FLU/RSV assay is intended as an aid in  the diagnosis of influenza from Nasopharyngeal swab specimens and  should not be used as a sole basis for treatment. Nasal washings and  aspirates are unacceptable for Xpert Xpress SARS-CoV-2/FLU/RSV  testing.  Fact Sheet for Patients: PinkCheek.be  Fact Sheet for Healthcare  Providers: GravelBags.it  This test is not yet approved or cleared by the [place] FDA and  has been authorized for detection and/or diagnosis of SARS-CoV-2 by  FDA under an Emergency Use Authorization (EUA). This EUA will remain  in effect (meaning this test can be used) for the duration of the  Covid-19 declaration under Section 564(b)(1) of the Act, 21  U.S.C. section 360bbb-3(b)(1), unless the authorization is  terminated or revoked. Performed at [facility], [place] [address] [place], [place] [address]   Urine culture     Status: Abnormal   Collection Time: 08/17/20  2:00 AM   Specimen: In/Out Cath Urine  Result Value Ref Range Status   Specimen Description   Final    IN/OUT CATH URINE Performed at [facility], [place] [address] [place], [place] [address]    Special Requests   Final    NONE Performed at [facility], [place] [address], [place], [place] [address]    Culture >=100,000 COLONIES/mL ESCHERICHIA COLI (A)  Final   Report Status 08/19/2020 FINAL  Final   Organism ID, Bacteria ESCHERICHIA COLI (A)  Final      Susceptibility   Escherichia coli - MIC*    AMPICILLIN >=32 RESISTANT Resistant     CEFAZOLIN 16 SENSITIVE Sensitive     CEFTRIAXONE <=0.25 SENSITIVE Sensitive     CIPROFLOXACIN >=4 RESISTANT Resistant     GENTAMICIN <=1 SENSITIVE Sensitive     IMIPENEM <=0.25 SENSITIVE Sensitive     NITROFURANTOIN <=16 SENSITIVE Sensitive     TRIMETH/SULFA >=320 RESISTANT Resistant     AMPICILLIN/SULBACTAM >=32 RESISTANT Resistant     PIP/TAZO <=4 SENSITIVE Sensitive     * >=100,000 COLONIES/mL ESCHERICHIA COLI  Culture, Urine     Status: Abnormal   Collection Time: 08/17/20  9:50 AM   Specimen: Urine, Random  Result Value Ref Range Status   Specimen Description   Final    URINE, RANDOM Performed at [facility] [address] [place], [place] [address]     Special Requests   Final    NONE Performed at [facility], [place] [address], [place], [place] [address]    Culture >=100,000 COLONIES/mL ESCHERICHIA COLI (A)  Final   Report Status 08/19/2020 FINAL  Final   Organism ID, Bacteria ESCHERICHIA COLI (A)  Final      Susceptibility   Escherichia coli - MIC*    AMPICILLIN >=32 RESISTANT Resistant     CEFAZOLIN 16 SENSITIVE Sensitive     CEFTRIAXONE <=0.25 SENSITIVE Sensitive     CIPROFLOXACIN >=4 RESISTANT Resistant     GENTAMICIN <=1 SENSITIVE Sensitive     IMIPENEM <=0.25 SENSITIVE Sensitive     NITROFURANTOIN <=16 SENSITIVE Sensitive     TRIMETH/SULFA >=320 RESISTANT Resistant     AMPICILLIN/SULBACTAM >=32 RESISTANT Resistant     PIP/TAZO 64 INTERMEDIATE Intermediate     * >=100,000 COLONIES/mL ESCHERICHIA COLI         Radiology Studies: DG Abd Portable 1V  Result Date: 08/24/2020 CLINICAL DATA:  Small bowel obstruction EXAM: PORTABLE ABDOMEN - 1 VIEW COMPARISON:  August 21, 2020; CT abdomen and pelvis August 16, 2020 FINDINGS: Moderate stool is seen in the colon. There remain loops of mildly dilated small bowel which raises question of a degree of residual small bowel obstruction. No free air evident. Visualized lung bases are clear. Postoperative change noted in the pelvis. IMPRESSION: Persistent loops of mildly dilated bowel, similar to most recent study and suggesting a residual degree of bowel obstruction. No free air. Moderate stool in colon. Lung bases clear. Electronically Signed   By: Lowella Grip III M.D.   On: 08/24/2020 08:33        Scheduled Meds:  Chlorhexidine Gluconate Cloth  6 each Topical Daily   feeding supplement  237 mL Oral BID BM   heparin injection (subcutaneous)  5,000 Units Subcutaneous Q8H   pantoprazole (PROTONIX) IV  40 mg Intravenous Q12H   polyethylene glycol  17 g Oral BID   tamsulosin  0.4 mg Oral Daily   Continuous Infusions:  sodium chloride 50 mL/hr at  08/23/20 2121     LOS: 8 days    Time spent: 30 minutes    Barb Merino, MD [facility] Pager [phone]

## 2020-08-24 NOTE — Consult Note (Addendum)
[facility] Nurse ostomy follow up Requested to assist with SMOG enema administration via stoma.  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 Pt has a very narrow tight stoma when finger inserted to assess the direction before cone tip was inserted; no stool was palpated and 1000 cc of the SMOG enema instilled. Stoma red and viable; 1 inch and flush with skin level, visible hernia surrounding. Pt tolerated until last 300 cc, when cramping was started and enema was stopped at that time.  Returned 800 cc of brown liquid stool with significant chunks of formed hard stool, then another 200 cc a half hour later with more chunks of stool. Replaced 2 piece pouch and barrier ring. Pt states he has been homeless or moved several times and had issues with obtaining ostomy supplies.  Placed on [facility] discharge program now that patient states he has a group home address.  He is using 2 1/4 inch, 2 piece pouches and barrier rings.  5 sets of extra supplies left at the bedside for staff nurses use and for patient to take after discharge.  Pt states he was independent with ostomy pouching and emptying routines prior to admission.  Enrolled patient in [place] Start Discharge program: Yes Please re-consult if further assistance is needed.  Thank-you,  Julien Girt MSN, [place], [place], [place], [place]

## 2020-08-24 NOTE — Progress Notes (Addendum)
[facility] Progress Note     Subjective: CC-  Continues to have intermittent suprapubic pain. Tolerating diet, denies n/v. Continues to pass flatus but not as much, states that he did not have to empty colostomy pouch last night but it does have air in it this morning. No stool.  Objective: Vital signs in last 24 hours: Temp:  [97.8 F (36.6 C)-98.1 F (36.7 C)] 98.1 F (36.7 C) (10/27 0427) Pulse Rate:  [83-97] 97 (10/27 0900) Resp:  [18] 18 (10/27 0427) BP: (87-124)/(60-70) 104/70 (10/27 0900) SpO2:  [94 %-96 %] 95 % (10/27 0427) Weight:  [59 kg] 59 kg (10/27 0500)    Intake/Output from previous day: 10/26 0701 - 10/27 0700 In: 580 [P.O.:480; IV Piggyback:100] Out: 4100 [Urine:4100] Intake/Output this shift: No intake/output data recorded.  PE: Gen: Alert, NAD Pulm: rate and effort normal Abd: Soft, ND,nontender,+BS,ostomy viable/air in pouch/ parastomal hernia soft, reducible and nontender GU: foley catheter in place Skin: no rashes noted, warm and dry   Lab Results:  Recent Labs    08/23/20 1357 08/24/20 0411  WBC 12.3* 12.6*  HGB 12.7* 12.0*  HCT 39.9 38.1*  PLT 260 268   BMET Recent Labs    08/23/20 0425 08/24/20 0411  NA 136 136  K 5.0 4.6  CL 106 104  CO2 23 22  GLUCOSE 114* 118*  BUN 12 12  CREATININE 0.82 0.95  CALCIUM 9.5 10.0   PT/INR No results for input(s): LABPROT, INR in the last 72 hours. CMP     Component Value Date/Time   NA 136 08/24/2020 0411   K 4.6 08/24/2020 0411   CL 104 08/24/2020 0411   CO2 22 08/24/2020 0411   GLUCOSE 118 (H) 08/24/2020 0411   BUN 12 08/24/2020 0411   CREATININE 0.95 08/24/2020 0411   CALCIUM 10.0 08/24/2020 0411   CALCIUM 11.8 (H) 08/17/2020 0430   PROT 6.6 08/24/2020 0411   ALBUMIN 2.8 (L) 08/24/2020 0411   AST 23 08/24/2020 0411   ALT 23 08/24/2020 0411   ALKPHOS 76 08/24/2020 0411   BILITOT 0.3 08/24/2020 0411   GFRNONAA >60 08/24/2020 0411   GFRAA >60 11/21/2019 1524    Lipase     Component Value Date/Time   LIPASE 43 08/16/2020 1955       Studies/Results: DG Abd Portable 1V  Result Date: 08/24/2020 CLINICAL DATA:  Small bowel obstruction EXAM: PORTABLE ABDOMEN - 1 VIEW COMPARISON:  August 21, 2020; CT abdomen and pelvis August 16, 2020 FINDINGS: Moderate stool is seen in the colon. There remain loops of mildly dilated small bowel which raises question of a degree of residual small bowel obstruction. No free air evident. Visualized lung bases are clear. Postoperative change noted in the pelvis. IMPRESSION: Persistent loops of mildly dilated bowel, similar to most recent study and suggesting a residual degree of bowel obstruction. No free air. Moderate stool in colon. Lung bases clear. Electronically Signed   By: Lowella Grip III M.D.   On: 08/24/2020 08:33    Anti-infectives: Anti-infectives (From admission, onward)   Start     Dose/Rate Route Frequency Ordered Stop   08/17/20 0930  cefTRIAXone (ROCEPHIN) 1 g in sodium chloride 0.9 % 100 mL IVPB        1 g 200 mL/hr over 30 Minutes Intravenous Every 24 hours 08/17/20 0856         Assessment/Plan Chest pain, abnormal EKG - cardiology following  Tobacco abuse UTI Nephrolithiasis BPH - patient self-caths, foley placed  and patient removed this last night Hypercalcemia - resolved Hypophosphatemia AKI - resolved Right nephrolithiasis E coli UTI  SBO related to ostomy -Continues to pass flatus but no stool. Abdominal exam is benign. He is tolerating a diet without nausea or vomiting. Reviewed Xray from this AM with MD: shows moderate stool in colon, some dilated small bowel and colon. Will ask [place] team to give smog enema per stoma.   ID -rocephin 10/20>> FEN -IVF,soft diet, Ensure VTE -SCDs, ok for chemical DVT prophylaxis from surgical standpint Foley -replaced 10/25 Follow up -TBD     LOS: 8 days    [name], [facility] 08/24/2020, 9:08  AM Please see Amion for pager number during day hours 7:00am-4:30pm

## 2020-08-25 DIAGNOSIS — K56609 Unspecified intestinal obstruction, unspecified as to partial versus complete obstruction: Secondary | ICD-10-CM | POA: Diagnosis not present

## 2020-08-25 LAB — BASIC METABOLIC PANEL
Anion gap: 7 (ref 5–15)
BUN: 16 mg/dL (ref 8–23)
CO2: 25 mmol/L (ref 22–32)
Calcium: 10.3 mg/dL (ref 8.9–10.3)
Chloride: 102 mmol/L (ref 98–111)
Creatinine, Ser: 0.91 mg/dL (ref 0.61–1.24)
GFR, Estimated: >60 mL/min (ref >60)
Glucose, Bld: 106 mg/dL — ABNORMAL HIGH (ref 70–99)
Potassium: 4.5 mmol/L (ref 3.5–5.1)
Sodium: 134 mmol/L — ABNORMAL LOW (ref 135–145)

## 2020-08-25 LAB — CBC WITH DIFFERENTIAL/PLATELET
Abs Immature Granulocytes: 0.05 10*3/uL (ref 0.00–0.07)
Basophils Absolute: 0.1 10*3/uL (ref 0.0–0.1)
Basophils Relative: 1 %
Eosinophils Absolute: 4.6 10*3/uL — ABNORMAL HIGH (ref 0.0–0.5)
Eosinophils Relative: 33 %
HCT: 37.4 % — ABNORMAL LOW (ref 39.0–52.0)
Hemoglobin: 11.9 g/dL — ABNORMAL LOW (ref 13.0–17.0)
Immature Granulocytes: 0 %
Lymphocytes Relative: 11 %
Lymphs Abs: 1.5 10*3/uL (ref 0.7–4.0)
MCH: 29.7 pg (ref 26.0–34.0)
MCHC: 31.8 g/dL (ref 30.0–36.0)
MCV: 93.3 fL (ref 80.0–100.0)
Monocytes Absolute: 1 10*3/uL (ref 0.1–1.0)
Monocytes Relative: 7 %
Neutro Abs: 6.5 10*3/uL (ref 1.7–7.7)
Neutrophils Relative %: 48 %
Platelets: 289 10*3/uL (ref 150–400)
RBC: 4.01 MIL/uL — ABNORMAL LOW (ref 4.22–5.81)
RDW: 13.2 % (ref 11.5–15.5)
WBC: 13.8 10*3/uL — ABNORMAL HIGH (ref 4.0–10.5)
nRBC: 0 % (ref 0.0–0.2)

## 2020-08-25 LAB — MAGNESIUM: Magnesium: 1.9 mg/dL (ref 1.7–2.4)

## 2020-08-25 MED ORDER — PANTOPRAZOLE SODIUM 40 MG PO TBEC
40.0000 mg | DELAYED_RELEASE_TABLET | Freq: Two times a day (BID) | ORAL | Status: DC
  Administered 2020-08-25 – 2020-08-28 (×7): 40 mg via ORAL
  Filled 2020-08-25 (×5): qty 1

## 2020-08-25 MED ORDER — SORBITOL 70 % SOLN
960.0000 mL | TOPICAL_OIL | Freq: Once | ORAL | Status: AC
  Administered 2020-08-26: 960 mL via RECTAL
  Filled 2020-08-25: qty 473

## 2020-08-25 NOTE — Progress Notes (Signed)
PHARMACIST - PHYSICIAN COMMUNICATION  DR:   Sloan Leiter  CONCERNING: IV to Oral Route Change Policy  RECOMMENDATION: This patient is receiving protonix by the intravenous route.  Based on criteria approved by the Pharmacy and Therapeutics Committee, the intravenous medication(s) is/are being converted to the equivalent oral dose form(s).   DESCRIPTION: These criteria include:  The patient is eating (either orally or via tube) and/or has been taking other orally administered medications for a least 24 hours  The patient has no evidence of active gastrointestinal bleeding or impaired GI absorption (gastrectomy, short bowel, patient on TNA or NPO).  If you have questions about this conversion, please contact the [place], [place], [facility]  Pharmacy: [phone] 08/25/2020 10:28 AM

## 2020-08-25 NOTE — Progress Notes (Signed)
Physical Therapy Treatment Patient Details Name: Jamie Wilson MRN: [id number] DOB: Jun 02, 1956 Today's Date: 08/25/2020    History of Present Illness 64 yo male admitted with SBO.    PT Comments    Patient making steady progress with acute therapy and requires supervision for transfers. He was able to complete repeated sit<>stands with no UE use for power up and educated on benefits of functional LE strengthening. Patient required min guard for gait in hallway and tolerated ambulation well. He will continue to benefit from acute PT services and was encouraged to mobilize with RN/NT staff to improve endurance.      Follow Up Recommendations  No PT follow up     Equipment Recommendations  None recommended by PT    Recommendations for Other Services       Precautions / Restrictions Precautions Precautions: Fall Restrictions Weight Bearing Restrictions: No    Mobility  Bed Mobility Overal bed mobility: Modified Independent             General bed mobility comments: uses rail  Transfers Overall transfer level: Needs assistance Equipment used: None;4-wheeled walker Transfers: Sit to/from Stand Sit to Stand: Supervision         General transfer comment: uses rail to push up. able to complete sit<>stands without use of UE's.  Ambulation/Gait Ambulation/Gait assistance: Min guard Gait Distance (Feet): 150 Feet Assistive device: IV Pole Gait Pattern/deviations: Step-through pattern;Decreased stride length Gait velocity: decr   General Gait Details: Pt with mild unsteadiness during gait. denied dizziness, CP, or SOB during gait. HR reached max of 147 bpm during gait.    Stairs             Wheelchair Mobility    Modified Rankin (Stroke Patients Only)       Balance Overall balance assessment: Needs assistance Sitting-balance support: Feet supported Sitting balance-Leahy Scale: Good     Standing balance support: During functional activity;Single  extremity supported Standing balance-Leahy Scale: Fair                              Cognition Arousal/Alertness: Awake/alert Behavior During Therapy: WFL for tasks assessed/performed Overall Cognitive Status: Within Functional Limits for tasks assessed                                        Exercises Other Exercises Other Exercises: 10 reps Sit<>Stand from EOB, no UE use for power up.     General Comments        Pertinent Vitals/Pain Pain Assessment: No/denies pain    Home Living                      Prior Function            PT Goals (current goals can now be found in the care plan section) Acute Rehab PT Goals PT Goal Formulation: With patient Time For Goal Achievement: 09/03/20 Potential to Achieve Goals: Good Progress towards PT goals: Progressing toward goals    Frequency    Min 3X/week      PT Plan Current plan remains appropriate    Co-evaluation              AM-PAC PT "6 Clicks" Mobility   Outcome Measure  Help needed turning from your back to your side while in a flat bed without using bedrails?:  None Help needed moving from lying on your back to sitting on the side of a flat bed without using bedrails?: None Help needed moving to and from a bed to a chair (including a wheelchair)?: None Help needed standing up from a chair using your arms (e.g., wheelchair or bedside chair)?: None Help needed to walk in hospital room?: A Little Help needed climbing 3-5 steps with a railing? : A Little 6 Click Score: 22    End of Session Equipment Utilized During Treatment: Gait belt Activity Tolerance: Patient tolerated treatment well Patient left: in bed;with call bell/phone within reach;with bed alarm set Nurse Communication: Mobility status PT Visit Diagnosis: Unsteadiness on feet (R26.81)     Time: 0626-9485 PT Time Calculation (min) (ACUTE ONLY): 23 min  Charges:  $Gait Training: 8-22 mins $Therapeutic  Exercise: 8-22 mins                     Verner Mould, DPT Acute Rehabilitation Services  Office [phone] Pager [phone]  08/25/2020 5:39 PM

## 2020-08-25 NOTE — Progress Notes (Signed)
[facility] Progress Note     Subjective: CC-  About 1L stool from ostomy yesterday with enema. He has had flatus and thin/red tinged fluid over night but no further stool. Denies any nausea or vomiting. Tolerating diet.  Objective: Vital signs in last 24 hours: Temp:  [97.6 F (36.4 C)-98.1 F (36.7 C)] 97.6 F (36.4 C) (10/28 0525) Pulse Rate:  [84-104] 84 (10/28 0525) Resp:  [18-20] 18 (10/28 0525) BP: (90-107)/(65-81) 104/65 (10/28 0525) SpO2:  [94 %-95 %] 95 % (10/28 0525) Weight:  [59.6 kg] 59.6 kg (10/28 0500)    Intake/Output from previous day: 10/27 0701 - 10/28 0700 In: 1310.7 [P.O.:240; I.V.:1070.7] Out: 4600 [Urine:3500; Stool:1100] Intake/Output this shift: No intake/output data recorded.  PE: Gen: Alert, NAD Pulm: rate and effort normal Abd: Soft, mild distension,nontender,+BS,ostomy viable/air and thin red-tinged fluid in pouch/parastomal hernia soft, reducible and nontender GU: foley catheter in place Skin: no rashes noted, warm and dry   Lab Results:  Recent Labs    08/24/20 0411 08/25/20 0335  WBC 12.6* 13.8*  HGB 12.0* 11.9*  HCT 38.1* 37.4*  PLT 268 289   BMET Recent Labs    08/24/20 0411 08/25/20 0335  NA 136 134*  K 4.6 4.5  CL 104 102  CO2 22 25  GLUCOSE 118* 106*  BUN 12 16  CREATININE 0.95 0.91  CALCIUM 10.0 10.3   PT/INR No results for input(s): LABPROT, INR in the last 72 hours. CMP     Component Value Date/Time   NA 134 (L) 08/25/2020 0335   K 4.5 08/25/2020 0335   CL 102 08/25/2020 0335   CO2 25 08/25/2020 0335   GLUCOSE 106 (H) 08/25/2020 0335   BUN 16 08/25/2020 0335   CREATININE 0.91 08/25/2020 0335   CALCIUM 10.3 08/25/2020 0335   CALCIUM 11.8 (H) 08/17/2020 0430   PROT 6.6 08/24/2020 0411   ALBUMIN 2.8 (L) 08/24/2020 0411   AST 23 08/24/2020 0411   ALT 23 08/24/2020 0411   ALKPHOS 76 08/24/2020 0411   BILITOT 0.3 08/24/2020 0411   GFRNONAA >60 08/25/2020 0335   GFRAA >60 11/21/2019  1524   Lipase     Component Value Date/Time   LIPASE 43 08/16/2020 1955       Studies/Results: DG Abd Portable 1V  Result Date: 08/24/2020 CLINICAL DATA:  Small bowel obstruction EXAM: PORTABLE ABDOMEN - 1 VIEW COMPARISON:  August 21, 2020; CT abdomen and pelvis August 16, 2020 FINDINGS: Moderate stool is seen in the colon. There remain loops of mildly dilated small bowel which raises question of a degree of residual small bowel obstruction. No free air evident. Visualized lung bases are clear. Postoperative change noted in the pelvis. IMPRESSION: Persistent loops of mildly dilated bowel, similar to most recent study and suggesting a residual degree of bowel obstruction. No free air. Moderate stool in colon. Lung bases clear. Electronically Signed   By: Lowella Grip III M.D.   On: 08/24/2020 08:33    Anti-infectives: Anti-infectives (From admission, onward)   Start     Dose/Rate Route Frequency Ordered Stop   08/17/20 0930  cefTRIAXone (ROCEPHIN) 1 g in sodium chloride 0.9 % 100 mL IVPB  Status:  Discontinued        1 g 200 mL/hr over 30 Minutes Intravenous Every 24 hours 08/17/20 0856 08/24/20 1017       Assessment/Plan Chest pain, abnormal EKG - cardiology following  Tobacco abuse UTI Nephrolithiasis BPH - patient self-caths, foley placedand patient removed this  last night Hypercalcemia - resolved Hypophosphatemia AKI - resolved Right nephrolithiasis E coli UTI  SBO related to ostomy, parastomal hernia Constipation -xray yesterday showed moderate stool burden in the right colon and dilated small bowel more consistent with ileus - Good stool output yesterday with enema but no further stool since then. He is passing flatus. Continue diet and bowel regimen. I think if he is able to pass some more stool on his own today then he could be discharged home.  ID -rocephin 10/20>>10/27 FEN -IVF,soft diet, Ensure VTE -SCDs, sq heparin Foley -replaced  10/25 Follow up -TBD    LOS: 9 days    Jamie [place], [facility] 08/25/2020, 9:08 AM Please see Amion for pager number during day hours 7:00am-4:30pm

## 2020-08-25 NOTE — Progress Notes (Signed)
PROGRESS NOTE    Jamie [place]  PFX:[id number] DOB: Sep 24, 1956 DOA: 08/16/2020 PCP: Patient, [name]    Brief Narrative:  64 year old gentleman with history of bowel obstruction status post partial bowel resection and colostomy presented to the ER with nausea vomiting and abdominal pain.  Also has history of urinary obstruction and was doing intermittent self cath at home.  Lives in a boardinghouse. In the emergency room hemodynamically stable.  A CT scan showed a small bowel herniation through the left lower quadrant ostomy defect causing high-grade small bowel obstruction.  Right nephrolithiasis without hydronephrosis.  NGT was placed with improvement of symptoms.   Assessment & Plan:   Principal Problem:   Small bowel obstruction (HCC) Active Problems:   Hypercalcemia   AKI (acute kidney injury) ([place])   Right nephrolithiasis  Small bowel obstruction secondary herniation through ostomy: Treated conservatively.  Followed by surgery. Large amount of the stool after enema through colostomy.   Continue regular diet that he is tolerating.   Denies nausea vomiting. Repeat KUB with improvement. Continue to mobilize and monitor.  Acute kidney injury, chronic recurrent urinary obstruction: Patient with history of BPH and intermittent self cath.  Recurrent urinary retention. Second Foley catheter in the hospital. Continue Flomax, discharged with Foley catheter with outpatient follow-up with urology for voiding trial.  E. coli UTI present on admission due to intermittent self-catheterization: Treated with Rocephin for 8 days.  Discontinued.  Abnormal EKG concern for pericarditis: Seen by cardiology.  Improved.  Electrolyte abnormalities including hypophosphatemia: All replaced and normalized.   DVT prophylaxis: heparin injection 5,000 Units Start: 08/18/20 1400 SCDs Start: 08/17/20 0001   Code Status: Full code Family Communication: None Disposition Plan: Status is:  Inpatient  Remains inpatient appropriate because:Inpatient level of care appropriate due to severity of illness   Dispo: The patient is from: Group home              Anticipated d/c is to: Group home              Anticipated d/c date is: Today or tomorrow.              Patient currently is not medically stable to d/c. Waiting to have spontaneous bowel movement in his colostomy bag before discharge.        Consultants:   General surgery  Procedures:   None  Antimicrobials:   Rocephin, 10/20--- 10/27   Subjective: Patient seen and examined.  No overnight events.  He had large amount of stool in his colostomy after enema yesterday and none since then. -He manipulates his Foley catheter himself, he flushed it with a sterile water to remove sediments. -Denies any abdominal pain.  He has some bloody drain on his colostomy but no stool.  Passing flatus.  Objective: Vitals:   08/24/20 1809 08/24/20 2029 08/25/20 0500 08/25/20 0525  BP: 104/68 90/68  104/65  Pulse: (!) 101 95  84  Resp:  20  18  Temp:  98.1 F (36.7 C)  97.6 F (36.4 C)  TempSrc:  Oral  Oral  SpO2: 95% 94%  95%  Weight:   59.6 kg   Height:        Intake/Output Summary (Last 24 hours) at 08/25/2020 1053 Last data filed at 08/25/2020 0500 Gross per 24 hour  Intake 1070.72 ml  Output 3800 ml  Net -2729.28 ml   Filed Weights   08/23/20 0500 08/24/20 0500 08/25/20 0500  Weight: 61.2 kg 59 kg 59.6 kg  Examination:  General exam: Appears calm and comfortable  Talkative.  On room air.  Not in any distress. Respiratory system: Clear to auscultation. Respiratory effort normal. Cardiovascular system: S1 & S2 heard, RRR. No JVD, murmurs, rubs, gallops or clicks. No pedal edema. Gastrointestinal system: Mildly distended around the stoma. Nontender and no rigidity or guarding. Colostomy present, small amount of pinkish liquid in the colostomy bag but no stool.  Bowel sounds present. Central nervous  system: Alert and oriented. No focal neurological deficits. Extremities: Symmetric 5 x 5 power. Skin: No rashes, lesions or ulcers Psychiatry: Judgement and insight appear normal. Mood & affect appropriate.     Data Reviewed: I have personally reviewed following labs and imaging studies  CBC: Recent Labs  Lab 08/22/20 0431 08/23/20 0425 08/23/20 1357 08/24/20 0411 08/25/20 0335  WBC 11.2* 13.7* 12.3* 12.6* 13.8*  NEUTROABS 4.7 7.1 5.6 5.7 6.5  HGB 11.1* 12.0* 12.7* 12.0* 11.9*  HCT 34.9* 37.2* 39.9 38.1* 37.4*  MCV 92.6 91.9 93.2 93.4 93.3  PLT 217 247 260 268 409   Basic Metabolic Panel: Recent Labs  Lab 08/20/20 0519 08/20/20 0519 08/21/20 0525 08/22/20 0431 08/23/20 0425 08/24/20 0411 08/25/20 0335  NA 136   < > 134* 134* 136 136 134*  K 3.9   < > 4.2 4.5 5.0 4.6 4.5  CL 107   < > 105 107 106 104 102  CO2 21*   < > 20* 23 23 22 25   GLUCOSE 111*   < > 90 100* 114* 118* 106*  BUN 16   < > 9 9 12 12 16   CREATININE 0.79   < > 0.77 0.79 0.82 0.95 0.91  CALCIUM 8.4*   < > 7.9* 8.5* 9.5 10.0 10.3  MG 1.7   < > 2.1 1.9 1.8 2.0 1.9  PHOS 1.1*  --  1.2* 1.9* 2.4* 2.3*  --    < > = values in this interval not displayed.   GFR: Estimated Creatinine Clearance: 68.2 mL/min (by C-G formula based on SCr of 0.91 mg/dL). Liver Function Tests: Recent Labs  Lab 08/20/20 0519 08/21/20 0525 08/22/20 0431 08/23/20 0425 08/24/20 0411  AST 13* 11* 14* 17 23  ALT 8 9 12 18 23   ALKPHOS 56 62 60 69 76  BILITOT 0.4 0.4 0.4 0.5 0.3  PROT 6.0* 6.0* 6.0* 6.4* 6.6  ALBUMIN 2.5* 2.5* 2.5* 2.6* 2.8*   No results for input(s): LIPASE, AMYLASE in the last 168 hours. No results for input(s): AMMONIA in the last 168 hours. Coagulation Profile: No results for input(s): INR, PROTIME in the last 168 hours. Cardiac Enzymes: No results for input(s): CKTOTAL, CKMB, CKMBINDEX, TROPONINI in the last 168 hours. BNP (last 3 results) No results for input(s): PROBNP in the last 8760  hours. HbA1C: No results for input(s): HGBA1C in the last 72 hours. CBG: No results for input(s): GLUCAP in the last 168 hours. Lipid Profile: No results for input(s): CHOL, HDL, LDLCALC, TRIG, CHOLHDL, LDLDIRECT in the last 72 hours. Thyroid Function Tests: No results for input(s): TSH, T4TOTAL, FREET4, T3FREE, THYROIDAB in the last 72 hours. Anemia Panel: No results for input(s): VITAMINB12, FOLATE, FERRITIN, TIBC, IRON, RETICCTPCT in the last 72 hours. Sepsis Labs: No results for input(s): PROCALCITON, LATICACIDVEN in the last 168 hours.  Recent Results (from the past 240 hour(s))  Blood Culture (routine x 2)     Status: None   Collection Time: 08/16/20  7:55 PM   Specimen: BLOOD  Result Value Ref  Range Status   Specimen Description   Final    BLOOD RIGHT ANTECUBITAL Performed at [facility] [address] [place], [place] [address]    Special Requests   Final    BOTTLES DRAWN AEROBIC AND ANAEROBIC Blood Culture adequate volume Performed at [facility] [address], [place], [place] [address]    Culture   Final    NO GROWTH 5 DAYS Performed at [facility], [place] [address], [place], [place] [address]    Report Status 08/21/2020 FINAL  Final  Blood Culture (routine x 2)     Status: None   Collection Time: 08/16/20  8:00 PM   Specimen: BLOOD  Result Value Ref Range Status   Specimen Description   Final    BLOOD LEFT ANTECUBITAL Performed at [facility] [address] [place], [place] [address]    Special Requests   Final    BOTTLES DRAWN AEROBIC AND ANAEROBIC Blood Culture adequate volume Performed at [facility] [address] [place], [place] [address]    Culture   Final    NO GROWTH 5 DAYS Performed at [facility], [place] [address], [place], [place] [address]    Report Status 08/21/2020 FINAL  Final  Respiratory Panel by RT PCR (Flu A&B, Covid) - Nasopharyngeal Swab      Status: None   Collection Time: 08/16/20  8:15 PM   Specimen: Nasopharyngeal Swab  Result Value Ref Range Status   SARS Coronavirus 2 by RT PCR NEGATIVE NEGATIVE Final    Comment: (NOTE) SARS-CoV-2 target nucleic acids are NOT DETECTED.  The SARS-CoV-2 RNA is generally detectable in upper respiratoy specimens during the acute phase of infection. The lowest concentration of SARS-CoV-2 viral copies this assay can detect is 131 copies/mL. A negative result does not preclude SARS-Cov-2 infection and should not be used as the sole basis for treatment or other patient management decisions. A negative result may occur with  improper specimen collection/handling, submission of specimen other than nasopharyngeal swab, presence of viral mutation(s) within the areas targeted by this assay, and inadequate number of viral copies (<131 copies/mL). A negative result must be combined with clinical observations, patient history, and epidemiological information. The expected result is Negative.  Fact Sheet for Patients:  PinkCheek.be  Fact Sheet for Healthcare Providers:  GravelBags.it  This test is no t yet approved or cleared by the [place] FDA and  has been authorized for detection and/or diagnosis of SARS-CoV-2 by FDA under an Emergency Use Authorization (EUA). This EUA will remain  in effect (meaning this test can be used) for the duration of the COVID-19 declaration under Section 564(b)(1) of the Act, 21 U.S.C. section 360bbb-3(b)(1), unless the authorization is terminated or revoked sooner.     Influenza A by PCR NEGATIVE NEGATIVE Final   Influenza B by PCR NEGATIVE NEGATIVE Final    Comment: (NOTE) The Xpert Xpress SARS-CoV-2/FLU/RSV assay is intended as an aid in  the diagnosis of influenza from Nasopharyngeal swab specimens and  should not be used as a sole basis for treatment. Nasal washings and  aspirates are  unacceptable for Xpert Xpress SARS-CoV-2/FLU/RSV  testing.  Fact Sheet for Patients: PinkCheek.be  Fact Sheet for Healthcare Providers: GravelBags.it  This test is not yet approved or cleared by the [place] FDA and  has been authorized for detection and/or diagnosis of SARS-CoV-2 by  FDA under an Emergency Use Authorization (EUA). This EUA  will remain  in effect (meaning this test can be used) for the duration of the  Covid-19 declaration under Section 564(b)(1) of the Act, 21  U.S.C. section 360bbb-3(b)(1), unless the authorization is  terminated or revoked. Performed at [facility], [place] [address] [place], [place] [address]   Urine culture     Status: Abnormal   Collection Time: 08/17/20  2:00 AM   Specimen: In/Out Cath Urine  Result Value Ref Range Status   Specimen Description   Final    IN/OUT CATH URINE Performed at [facility] [address], [place], [place] [address]    Special Requests   Final    NONE Performed at [facility], [place] [address] [place], [place] [address]    Culture >=100,000 COLONIES/mL ESCHERICHIA COLI (A)  Final   Report Status 08/19/2020 FINAL  Final   Organism ID, Bacteria ESCHERICHIA COLI (A)  Final      Susceptibility   Escherichia coli - MIC*    AMPICILLIN >=32 RESISTANT Resistant     CEFAZOLIN 16 SENSITIVE Sensitive     CEFTRIAXONE <=0.25 SENSITIVE Sensitive     CIPROFLOXACIN >=4 RESISTANT Resistant     GENTAMICIN <=1 SENSITIVE Sensitive     IMIPENEM <=0.25 SENSITIVE Sensitive     NITROFURANTOIN <=16 SENSITIVE Sensitive     TRIMETH/SULFA >=320 RESISTANT Resistant     AMPICILLIN/SULBACTAM >=32 RESISTANT Resistant     PIP/TAZO <=4 SENSITIVE Sensitive     * >=100,000 COLONIES/mL ESCHERICHIA COLI  Culture, Urine     Status: Abnormal   Collection Time: 08/17/20  9:50 AM   Specimen: Urine, Random  Result Value Ref  Range Status   Specimen Description   Final    URINE, RANDOM Performed at [facility] [address], [place], [place] [address]    Special Requests   Final    NONE Performed at [facility], [place] [address] [place],  [address]    Culture >=100,000 COLONIES/mL ESCHERICHIA COLI (A)  Final   Report Status 08/19/2020 FINAL  Final   Organism ID, Bacteria ESCHERICHIA COLI (A)  Final      Susceptibility   Escherichia coli - MIC*    AMPICILLIN >=32 RESISTANT Resistant     CEFAZOLIN 16 SENSITIVE Sensitive     CEFTRIAXONE <=0.25 SENSITIVE Sensitive     CIPROFLOXACIN >=4 RESISTANT Resistant     GENTAMICIN <=1 SENSITIVE Sensitive     IMIPENEM <=0.25 SENSITIVE Sensitive     NITROFURANTOIN <=16 SENSITIVE Sensitive     TRIMETH/SULFA >=320 RESISTANT Resistant     AMPICILLIN/SULBACTAM >=32 RESISTANT Resistant     PIP/TAZO 64 INTERMEDIATE Intermediate     * >=100,000 COLONIES/mL ESCHERICHIA COLI         Radiology Studies: DG Abd Portable 1V  Result Date: 08/24/2020 CLINICAL DATA:  Small bowel obstruction EXAM: PORTABLE ABDOMEN - 1 VIEW COMPARISON:  August 21, 2020; CT abdomen and pelvis August 16, 2020 FINDINGS: Moderate stool is seen in the colon. There remain loops of mildly dilated small bowel which raises question of a degree of residual small bowel obstruction. No free air evident. Visualized lung bases are clear. Postoperative change noted in the pelvis. IMPRESSION: Persistent loops of mildly dilated bowel, similar to most recent study and suggesting a residual degree of bowel obstruction. No free air. Moderate stool in colon. Lung bases clear. Electronically Signed   By: Lowella Grip III M.D.   On: 08/24/2020 08:33  Scheduled Meds:  Chlorhexidine Gluconate Cloth  6 each Topical Daily   feeding supplement  237 mL Oral BID BM   heparin injection (subcutaneous)  5,000 Units Subcutaneous Q8H   pantoprazole  40 mg Oral BID  AC   polyethylene glycol  17 g Oral BID   tamsulosin  0.4 mg Oral Daily   Continuous Infusions:  sodium chloride 50 mL/hr at 08/24/20 1704     LOS: 9 days    Time spent: 30 minutes    Barb Merino, MD [facility] Pager [phone]

## 2020-08-25 NOTE — Progress Notes (Signed)
Pt due to have irrigation from colostomy, [facility] team paged await follow up call and order noted in [facility], will update oncoming RN, will need to report to am RN to call [facility] in am to complete irrigation per MD order. SRP, RN

## 2020-08-26 DIAGNOSIS — K56609 Unspecified intestinal obstruction, unspecified as to partial versus complete obstruction: Secondary | ICD-10-CM | POA: Diagnosis not present

## 2020-08-26 LAB — CBC WITH DIFFERENTIAL/PLATELET
Abs Immature Granulocytes: 0.14 10*3/uL — ABNORMAL HIGH (ref 0.00–0.07)
Basophils Absolute: 0.1 10*3/uL (ref 0.0–0.1)
Basophils Relative: 1 %
Eosinophils Absolute: 4.4 10*3/uL — ABNORMAL HIGH (ref 0.0–0.5)
Eosinophils Relative: 31 %
HCT: 38 % — ABNORMAL LOW (ref 39.0–52.0)
Hemoglobin: 12.2 g/dL — ABNORMAL LOW (ref 13.0–17.0)
Immature Granulocytes: 1 %
Lymphocytes Relative: 10 %
Lymphs Abs: 1.4 10*3/uL (ref 0.7–4.0)
MCH: 30 pg (ref 26.0–34.0)
MCHC: 32.1 g/dL (ref 30.0–36.0)
MCV: 93.6 fL (ref 80.0–100.0)
Monocytes Absolute: 1 10*3/uL (ref 0.1–1.0)
Monocytes Relative: 7 %
Neutro Abs: 7 10*3/uL (ref 1.7–7.7)
Neutrophils Relative %: 50 %
Platelets: 301 10*3/uL (ref 150–400)
RBC: 4.06 MIL/uL — ABNORMAL LOW (ref 4.22–5.81)
RDW: 13.3 % (ref 11.5–15.5)
WBC: 14.2 10*3/uL — ABNORMAL HIGH (ref 4.0–10.5)
nRBC: 0 % (ref 0.0–0.2)

## 2020-08-26 LAB — BASIC METABOLIC PANEL
Anion gap: 8 (ref 5–15)
BUN: 17 mg/dL (ref 8–23)
CO2: 26 mmol/L (ref 22–32)
Calcium: 10.7 mg/dL — ABNORMAL HIGH (ref 8.9–10.3)
Chloride: 100 mmol/L (ref 98–111)
Creatinine, Ser: 0.98 mg/dL (ref 0.61–1.24)
GFR, Estimated: >60 mL/min (ref >60)
Glucose, Bld: 106 mg/dL — ABNORMAL HIGH (ref 70–99)
Potassium: 4.4 mmol/L (ref 3.5–5.1)
Sodium: 134 mmol/L — ABNORMAL LOW (ref 135–145)

## 2020-08-26 NOTE — Progress Notes (Signed)
PROGRESS NOTE    Jamie Wilson  EPP:[id number] DOB: 17-Oct-1956 DOA: 08/16/2020 PCP: Patient, [name]    Brief Narrative:  64 year old gentleman with history of bowel obstruction status post partial bowel resection and colostomy presented to the ER with nausea, vomiting and abdominal pain.  Also has history of urinary obstruction and was doing intermittent self cath at home.  Lives in a boardinghouse. In the emergency room hemodynamically stable.  A CT scan showed a small bowel herniation through the left lower quadrant ostomy defect causing high-grade small bowel obstruction.  Right nephrolithiasis without hydronephrosis.  NGT was placed with improvement of symptoms.   Assessment & Plan:   Principal Problem:   Small bowel obstruction (HCC) Active Problems:   Hypercalcemia   AKI (acute kidney injury) ([place])   Right nephrolithiasis  Small bowel obstruction secondary herniation through ostomy: Treated conservatively.  Followed by surgery. Large amount of the stool after enema through colostomy and none since then.  Continue regular diet that he is tolerating.   Denies nausea vomiting. Plan for repeat enema today.  Acute kidney injury, chronic recurrent urinary obstruction: Patient with history of BPH and intermittent self cath.  Recurrent urinary retention. Second Foley catheter in the hospital. Continue Flomax, will with Foley catheter with outpatient follow-up with urology for voiding trial. Continue flushing catheter twice a day to avoid sediments buildup.  E. coli UTI present on admission due to intermittent self-catheterization: Treated with Rocephin for 8 days.  Discontinued.  Abnormal EKG concern for pericarditis: Seen by cardiology.  Improved.  Electrolyte abnormalities including hypophosphatemia: All replaced and normalized.   DVT prophylaxis: heparin injection 5,000 Units Start: 08/18/20 1400 SCDs Start: 08/17/20 0001   Code Status: Full code Family  Communication: None Disposition Plan: Status is: Inpatient  Remains inpatient appropriate because:Inpatient level of care appropriate due to severity of illness   Dispo: The patient is from: Group home              Anticipated d/c is to: Group home              Anticipated d/c date is: 1 to 2 days.              Patient currently is not medically stable to d/c. Waiting to have spontaneous bowel movement in his colostomy bag before discharge.        Consultants:   General surgery  Procedures:   None  Antimicrobials:   Rocephin, 10/20--- 10/27   Subjective: No overnight events.  Denies any abdominal pain.  Denies any nausea vomiting.  Eating regular food along with supplements.  No stool in the stoma.  Passing flatus.  Objective: Vitals:   08/25/20 0525 08/25/20 1323 08/25/20 2054 08/26/20 0618  BP: 104/65 108/71 99/61 100/64  Pulse: 84 85 91 88  Resp: 18 17 18 17   Temp: 97.6 F (36.4 C) 98 F (36.7 C) 97.7 F (36.5 C) 98.5 F (36.9 C)  TempSrc: Oral Oral Oral Oral  SpO2: 95% 95% 94% 94%  Weight:      Height:        Intake/Output Summary (Last 24 hours) at 08/26/2020 1050 Last data filed at 08/26/2020 0618 Gross per 24 hour  Intake 540 ml  Output 2495 ml  Net -1955 ml   Filed Weights   08/23/20 0500 08/24/20 0500 08/25/20 0500  Weight: 61.2 kg 59 kg 59.6 kg    Examination:  General exam: Appears calm and comfortable .  Not in any distress. Respiratory  system: Clear to auscultation. Respiratory effort normal. Cardiovascular system: S1 & S2 heard, RRR. No JVD, murmurs, rubs, gallops or clicks. No pedal edema. Gastrointestinal system: Mildly distended around the stoma. Nontender and no rigidity or guarding. Colostomy present.  Some pinkish fluid present in the bag but no stool. Central nervous system: Alert and oriented. No focal neurological deficits. Extremities: Symmetric 5 x 5 power. Skin: No rashes, lesions or ulcers Psychiatry: Judgement and  insight appear normal. Mood & affect appropriate.  Foley catheter with plenty of sediments on it.   Data Reviewed: I have personally reviewed following labs and imaging studies  CBC: Recent Labs  Lab 08/23/20 0425 08/23/20 1357 08/24/20 0411 08/25/20 0335 08/26/20 0401  WBC 13.7* 12.3* 12.6* 13.8* 14.2*  NEUTROABS 7.1 5.6 5.7 6.5 7.0  HGB 12.0* 12.7* 12.0* 11.9* 12.2*  HCT 37.2* 39.9 38.1* 37.4* 38.0*  MCV 91.9 93.2 93.4 93.3 93.6  PLT 247 260 268 289 570   Basic Metabolic Panel: Recent Labs  Lab 08/20/20 0519 08/20/20 0519 08/21/20 0525 08/21/20 0525 08/22/20 0431 08/23/20 0425 08/24/20 0411 08/25/20 0335 08/26/20 0401  NA 136   < > 134*   < > 134* 136 136 134* 134*  K 3.9   < > 4.2   < > 4.5 5.0 4.6 4.5 4.4  CL 107   < > 105   < > 107 106 104 102 100  CO2 21*   < > 20*   < > 23 23 22 25 26   GLUCOSE 111*   < > 90   < > 100* 114* 118* 106* 106*  BUN 16   < > 9   < > 9 12 12 16 17   CREATININE 0.79   < > 0.77   < > 0.79 0.82 0.95 0.91 0.98  CALCIUM 8.4*   < > 7.9*   < > 8.5* 9.5 10.0 10.3 10.7*  MG 1.7   < > 2.1  --  1.9 1.8 2.0 1.9  --   PHOS 1.1*  --  1.2*  --  1.9* 2.4* 2.3*  --   --    < > = values in this interval not displayed.   GFR: Estimated Creatinine Clearance: 63.4 mL/min (by C-G formula based on SCr of 0.98 mg/dL). Liver Function Tests: Recent Labs  Lab 08/20/20 0519 08/21/20 0525 08/22/20 0431 08/23/20 0425 08/24/20 0411  AST 13* 11* 14* 17 23  ALT 8 9 12 18 23   ALKPHOS 56 62 60 69 76  BILITOT 0.4 0.4 0.4 0.5 0.3  PROT 6.0* 6.0* 6.0* 6.4* 6.6  ALBUMIN 2.5* 2.5* 2.5* 2.6* 2.8*   No results for input(s): LIPASE, AMYLASE in the last 168 hours. No results for input(s): AMMONIA in the last 168 hours. Coagulation Profile: No results for input(s): INR, PROTIME in the last 168 hours. Cardiac Enzymes: No results for input(s): CKTOTAL, CKMB, CKMBINDEX, TROPONINI in the last 168 hours. BNP (last 3 results) No results for input(s): PROBNP in the  last 8760 hours. HbA1C: No results for input(s): HGBA1C in the last 72 hours. CBG: No results for input(s): GLUCAP in the last 168 hours. Lipid Profile: No results for input(s): CHOL, HDL, LDLCALC, TRIG, CHOLHDL, LDLDIRECT in the last 72 hours. Thyroid Function Tests: No results for input(s): TSH, T4TOTAL, FREET4, T3FREE, THYROIDAB in the last 72 hours. Anemia Panel: No results for input(s): VITAMINB12, FOLATE, FERRITIN, TIBC, IRON, RETICCTPCT in the last 72 hours. Sepsis Labs: No results for input(s): PROCALCITON, LATICACIDVEN in the last  168 hours.  Recent Results (from the past 240 hour(s))  Blood Culture (routine x 2)     Status: None   Collection Time: 08/16/20  7:55 PM   Specimen: BLOOD  Result Value Ref Range Status   Specimen Description   Final    BLOOD RIGHT ANTECUBITAL Performed at [facility] [address], [place], [place] [address]    Special Requests   Final    BOTTLES DRAWN AEROBIC AND ANAEROBIC Blood Culture adequate volume Performed at [facility] [address], [place], [place] [address]    Culture   Final    NO GROWTH 5 DAYS Performed at [facility], [place] [address] [place], [place] [address]    Report Status 08/21/2020 FINAL  Final  Blood Culture (routine x 2)     Status: None   Collection Time: 08/16/20  8:00 PM   Specimen: BLOOD  Result Value Ref Range Status   Specimen Description   Final    BLOOD LEFT ANTECUBITAL Performed at [facility] [address] [place], [place] [address]    Special Requests   Final    BOTTLES DRAWN AEROBIC AND ANAEROBIC Blood Culture adequate volume Performed at [facility] [address] [place], [place] [address]    Culture   Final    NO GROWTH 5 DAYS Performed at [facility], [place] [address], [place], [place] [address]    Report Status 08/21/2020 FINAL  Final  Respiratory Panel by RT PCR (Flu A&B, Covid) -  Nasopharyngeal Swab     Status: None   Collection Time: 08/16/20  8:15 PM   Specimen: Nasopharyngeal Swab  Result Value Ref Range Status   SARS Coronavirus 2 by RT PCR NEGATIVE NEGATIVE Final    Comment: (NOTE) SARS-CoV-2 target nucleic acids are NOT DETECTED.  The SARS-CoV-2 RNA is generally detectable in upper respiratoy specimens during the acute phase of infection. The lowest concentration of SARS-CoV-2 viral copies this assay can detect is 131 copies/mL. A negative result does not preclude SARS-Cov-2 infection and should not be used as the sole basis for treatment or other patient management decisions. A negative result may occur with  improper specimen collection/handling, submission of specimen other than nasopharyngeal swab, presence of viral mutation(s) within the areas targeted by this assay, and inadequate number of viral copies (<131 copies/mL). A negative result must be combined with clinical observations, patient history, and epidemiological information. The expected result is Negative.  Fact Sheet for Patients:  PinkCheek.be  Fact Sheet for Healthcare Providers:  GravelBags.it  This test is no t yet approved or cleared by the [place] FDA and  has been authorized for detection and/or diagnosis of SARS-CoV-2 by FDA under an Emergency Use Authorization (EUA). This EUA will remain  in effect (meaning this test can be used) for the duration of the COVID-19 declaration under Section 564(b)(1) of the Act, 21 U.S.C. section 360bbb-3(b)(1), unless the authorization is terminated or revoked sooner.     Influenza A by PCR NEGATIVE NEGATIVE Final   Influenza B by PCR NEGATIVE NEGATIVE Final    Comment: (NOTE) The Xpert Xpress SARS-CoV-2/FLU/RSV assay is intended as an aid in  the diagnosis of influenza from Nasopharyngeal swab specimens and  should not be used as a sole basis for treatment. Nasal washings and    aspirates are unacceptable for Xpert Xpress SARS-CoV-2/FLU/RSV  testing.  Fact Sheet for Patients: PinkCheek.be  Fact Sheet for  Healthcare Providers: GravelBags.it  This test is not yet approved or cleared by the [place] and  has been authorized for detection and/or diagnosis of SARS-CoV-2 by  FDA under an Emergency Use Authorization (EUA). This EUA will remain  in effect (meaning this test can be used) for the duration of the  Covid-19 declaration under Section 564(b)(1) of the Act, 21  U.S.C. section 360bbb-3(b)(1), unless the authorization is  terminated or revoked. Performed at [facility], [place] [address], [place], [place] [address]   Urine culture     Status: Abnormal   Collection Time: 08/17/20  2:00 AM   Specimen: In/Out Cath Urine  Result Value Ref Range Status   Specimen Description   Final    IN/OUT CATH URINE Performed at [facility] [address] [place], [place] [address]    Special Requests   Final    NONE Performed at [facility], [place] [address] [place], [place] [address]    Culture >=100,000 COLONIES/mL ESCHERICHIA COLI (A)  Final   Report Status 08/19/2020 FINAL  Final   Organism ID, Bacteria ESCHERICHIA COLI (A)  Final      Susceptibility   Escherichia coli - MIC*    AMPICILLIN >=32 RESISTANT Resistant     CEFAZOLIN 16 SENSITIVE Sensitive     CEFTRIAXONE <=0.25 SENSITIVE Sensitive     CIPROFLOXACIN >=4 RESISTANT Resistant     GENTAMICIN <=1 SENSITIVE Sensitive     IMIPENEM <=0.25 SENSITIVE Sensitive     NITROFURANTOIN <=16 SENSITIVE Sensitive     TRIMETH/SULFA >=320 RESISTANT Resistant     AMPICILLIN/SULBACTAM >=32 RESISTANT Resistant     PIP/TAZO <=4 SENSITIVE Sensitive     * >=100,000 COLONIES/mL ESCHERICHIA COLI  Culture, Urine     Status: Abnormal   Collection Time: 08/17/20  9:50 AM   Specimen: Urine, Random   Result Value Ref Range Status   Specimen Description   Final    URINE, RANDOM Performed at [facility] [address], [place], [place] [address]    Special Requests   Final    NONE Performed at [facility], [place] [address], [place], [place] [address]    Culture >=100,000 COLONIES/mL ESCHERICHIA COLI (A)  Final   Report Status 08/19/2020 FINAL  Final   Organism ID, Bacteria ESCHERICHIA COLI (A)  Final      Susceptibility   Escherichia coli - MIC*    AMPICILLIN >=32 RESISTANT Resistant     CEFAZOLIN 16 SENSITIVE Sensitive     CEFTRIAXONE <=0.25 SENSITIVE Sensitive     CIPROFLOXACIN >=4 RESISTANT Resistant     GENTAMICIN <=1 SENSITIVE Sensitive     IMIPENEM <=0.25 SENSITIVE Sensitive     NITROFURANTOIN <=16 SENSITIVE Sensitive     TRIMETH/SULFA >=320 RESISTANT Resistant     AMPICILLIN/SULBACTAM >=32 RESISTANT Resistant     PIP/TAZO 64 INTERMEDIATE Intermediate     * >=100,000 COLONIES/mL ESCHERICHIA COLI         Radiology Studies: No results found.      Scheduled Meds: . Chlorhexidine Gluconate Cloth  6 each Topical Daily  . feeding supplement  237 mL Oral BID BM  . heparin injection (subcutaneous)  5,000 Units Subcutaneous Q8H  . pantoprazole  40 mg Oral BID AC  . polyethylene glycol  17 g Oral BID  . sorbitol, milk of mag, mineral oil, glycerin (SMOG) enema  960 mL Rectal Once  . tamsulosin  0.4 mg Oral Daily  Continuous Infusions: . sodium chloride 50 mL/hr at 08/25/20 2213     LOS: 10 days    Time spent: 30 minutes    Barb Merino, MD [facility] Pager [phone]

## 2020-08-26 NOTE — Consult Note (Signed)
[facility] Nurse ostomy follow up LLQ colostomy.  MD has ordered another SMOG enema today.  Able to tolerate 800Ml instilled until pressure was uncomfortable.  Irrigation sleeve is applied and the 800Ml are returned with moderate chunks of stool.  Afterwards, additional 200ML of soft brown stool is spontaneously released.  Patient states this has relieved a lot of pressure.  New 2 1/4" pouch was applied with barrier ring.  Reported findings to bedside nurse Will not follow at this time.  Please re-consult if needed.  Domenic Moras MSN, RN, FNP-BC CWON Wound, Ostomy, Continence Nurse Pager [phone]

## 2020-08-26 NOTE — Progress Notes (Addendum)
Wound Ostomy Continence(WOC) RN in to see pt, administered 830ml of SMOG enema with positive return. Pt currently having brown soft stool with solid particles, spontaneously on is own. Ostomy care completed by [facility] RN and pt, will continue to monitor. MD made aware. SRP, RN

## 2020-08-26 NOTE — Progress Notes (Addendum)
[facility] Progress Note     Subjective: CC-  Feels about the same. Tolerating diet without nausea or vomiting. He drank 2 Ensure and a Boost yesterday in addition to meals. Continues to pass flatus from colostomy, no stool since enema 2 days ago.  Objective: Vital signs in last 24 hours: Temp:  [97.7 F (36.5 C)-98.5 F (36.9 C)] 98.5 F (36.9 C) (10/29 0618) Pulse Rate:  [85-91] 88 (10/29 0618) Resp:  [17-18] 17 (10/29 0618) BP: (99-108)/(61-71) 100/64 (10/29 0618) SpO2:  [94 %-95 %] 94 % (10/29 0618)    Intake/Output from previous day: 10/28 0701 - 10/29 0700 In: 1020 [P.O.:960] Out: 2495 [Urine:2445; Stool:50] Intake/Output this shift: No intake/output data recorded.  PE: Gen: Alert, NAD Pulm: rate and effort normal Abd: Soft, mild distension,nontender,+BS,ostomy viable/air and thin red-tinged fluid in pouch/parastomal hernia soft, reducible and nontender GU: foley catheter in place with dark yellow urine Skin: no rashes noted, warm and dry    Lab Results:  Recent Labs    08/25/20 0335 08/26/20 0401  WBC 13.8* 14.2*  HGB 11.9* 12.2*  HCT 37.4* 38.0*  PLT 289 301   BMET Recent Labs    08/25/20 0335 08/26/20 0401  NA 134* 134*  K 4.5 4.4  CL 102 100  CO2 25 26  GLUCOSE 106* 106*  BUN 16 17  CREATININE 0.91 0.98  CALCIUM 10.3 10.7*   PT/INR No results for input(s): LABPROT, INR in the last 72 hours. CMP     Component Value Date/Time   NA 134 (L) 08/26/2020 0401   K 4.4 08/26/2020 0401   CL 100 08/26/2020 0401   CO2 26 08/26/2020 0401   GLUCOSE 106 (H) 08/26/2020 0401   BUN 17 08/26/2020 0401   CREATININE 0.98 08/26/2020 0401   CALCIUM 10.7 (H) 08/26/2020 0401   CALCIUM 11.8 (H) 08/17/2020 0430   PROT 6.6 08/24/2020 0411   ALBUMIN 2.8 (L) 08/24/2020 0411   AST 23 08/24/2020 0411   ALT 23 08/24/2020 0411   ALKPHOS 76 08/24/2020 0411   BILITOT 0.3 08/24/2020 0411   GFRNONAA >60 08/26/2020 0401   GFRAA >60 11/21/2019  1524   Lipase     Component Value Date/Time   LIPASE 43 08/16/2020 1955       Studies/Results: No results found.  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 Continue diet and bowel regimen.  ID -rocephin 10/20>>10/27 FEN -IVF,soft diet, Ensure/Boost VTE -SCDs, sq heparin Foley -replaced10/25 Follow up -TBD    LOS: 10 days    [name], [facility] 08/26/2020, 9:46 AM Please see Amion for pager number during day hours 7:00am-4:30pm

## 2020-08-26 NOTE — Progress Notes (Signed)
Physical Therapy Treatment Patient Details Name: Jamie Wilson MRN: [id number] DOB: 06/24/1956 Today's Date: 08/26/2020    History of Present Illness 64 yo male admitted with SBO.    PT Comments    Patient progressing well with acute therapy and ambulated ~ 400' with no rest breaks required. He continues to rely on IV pole for UE support during gait to maintain balance. HR max of 137 bpm with ambulation and c/o fatigue or dizziness. Continued with functional LE strengthening today for sit<>stands and pt able to complete increased reps without difficulty. Acute PT will continue to follow and progress as able.    Follow Up Recommendations  No PT follow up     Equipment Recommendations  None recommended by PT    Recommendations for Other Services       Precautions / Restrictions Precautions Precautions: Fall Restrictions Weight Bearing Restrictions: No    Mobility  Bed Mobility Overal bed mobility: Modified Independent             General bed mobility comments: uses rail  Transfers Overall transfer level: Needs assistance Equipment used: None;4-wheeled walker Transfers: Sit to/from Stand Sit to Stand: Supervision         General transfer comment: uses rail to push up. able to complete sit<>stands without use of UE's.  Ambulation/Gait Ambulation/Gait assistance: Min guard Gait Distance (Feet): 400 Feet Assistive device: IV Pole Gait Pattern/deviations: Step-through pattern;Decreased stride length Gait velocity: decr   General Gait Details: pt mildly unsteady with gait and reliant on IV pole for support to maintain balance. Pt HR in 120's throughout majority of gait and reached max of 137 bpm with gait. No rest breaks required.   Stairs             Wheelchair Mobility    Modified Rankin (Stroke Patients Only)       Balance Overall balance assessment: Needs assistance Sitting-balance support: Feet supported Sitting balance-Leahy Scale: Good      Standing balance support: During functional activity;Single extremity supported Standing balance-Leahy Scale: Fair                              Cognition Arousal/Alertness: Awake/alert Behavior During Therapy: WFL for tasks assessed/performed Overall Cognitive Status: Within Functional Limits for tasks assessed                                        Exercises Other Exercises Other Exercises: 2x15 reps Sit<>Stands from EOB, HR max of 140 bpm during exercises.    General Comments        Pertinent Vitals/Pain      Home Living                      Prior Function            PT Goals (current goals can now be found in the care plan section) Acute Rehab PT Goals PT Goal Formulation: With patient Time For Goal Achievement: 09/03/20 Potential to Achieve Goals: Good Progress towards PT goals: Progressing toward goals    Frequency    Min 3X/week      PT Plan Current plan remains appropriate    Co-evaluation              AM-PAC PT "6 Clicks" Mobility   Outcome Measure  Help needed turning from your  back to your side while in a flat bed without using bedrails?: None Help needed moving from lying on your back to sitting on the side of a flat bed without using bedrails?: None Help needed moving to and from a bed to a chair (including a wheelchair)?: None Help needed standing up from a chair using your arms (e.g., wheelchair or bedside chair)?: None Help needed to walk in hospital room?: A Little Help needed climbing 3-5 steps with a railing? : A Little 6 Click Score: 22    End of Session Equipment Utilized During Treatment: Gait belt Activity Tolerance: Patient tolerated treatment well Patient left: in bed;with call bell/phone within reach;with bed alarm set Nurse Communication: Mobility status PT Visit Diagnosis: Unsteadiness on feet (R26.81)     Time: 9892-1194 PT Time Calculation (min) (ACUTE ONLY): 24  min  Charges:  $Gait Training: 8-22 mins $Therapeutic Exercise: 8-22 mins                     Verner Mould, DPT Acute Rehabilitation Services  Office [phone] Pager [phone]  08/26/2020 2:51 PM

## 2020-08-27 LAB — BASIC METABOLIC PANEL
Anion gap: 7 (ref 5–15)
BUN: 18 mg/dL (ref 8–23)
CO2: 23 mmol/L (ref 22–32)
Calcium: 10 mg/dL (ref 8.9–10.3)
Chloride: 105 mmol/L (ref 98–111)
Creatinine, Ser: 0.95 mg/dL (ref 0.61–1.24)
GFR, Estimated: >60 mL/min (ref >60)
Glucose, Bld: 119 mg/dL — ABNORMAL HIGH (ref 70–99)
Potassium: 4.4 mmol/L (ref 3.5–5.1)
Sodium: 135 mmol/L (ref 135–145)

## 2020-08-27 LAB — CBC WITH DIFFERENTIAL/PLATELET
Abs Immature Granulocytes: 0 10*3/uL (ref 0.00–0.07)
Band Neutrophils: 0 %
Basophils Absolute: 0.1 10*3/uL (ref 0.0–0.1)
Basophils Relative: 1 %
Blasts: 0 %
Eosinophils Absolute: 5 10*3/uL — ABNORMAL HIGH (ref 0.0–0.5)
Eosinophils Relative: 34 %
HCT: 36 % — ABNORMAL LOW (ref 39.0–52.0)
Hemoglobin: 11.1 g/dL — ABNORMAL LOW (ref 13.0–17.0)
Lymphocytes Relative: 12 %
Lymphs Abs: 1.8 10*3/uL (ref 0.7–4.0)
MCH: 29.7 pg (ref 26.0–34.0)
MCHC: 30.8 g/dL (ref 30.0–36.0)
MCV: 96.3 fL (ref 80.0–100.0)
Metamyelocytes Relative: 0 %
Monocytes Absolute: 0.9 10*3/uL (ref 0.1–1.0)
Monocytes Relative: 6 %
Myelocytes: 0 %
Neutro Abs: 6.8 10*3/uL (ref 1.7–7.7)
Neutrophils Relative %: 47 %
Other: 0 %
Platelets: 304 10*3/uL (ref 150–400)
Promyelocytes Relative: 0 %
RBC: 3.74 MIL/uL — ABNORMAL LOW (ref 4.22–5.81)
RDW: 13.5 % (ref 11.5–15.5)
WBC: 14.6 10*3/uL — ABNORMAL HIGH (ref 4.0–10.5)
nRBC: 0 % (ref 0.0–0.2)
nRBC: 0 /100 WBC

## 2020-08-27 MED ORDER — FLEET ENEMA 7-19 GM/118ML RE ENEM
1.0000 | ENEMA | Freq: Once | RECTAL | Status: AC
  Administered 2020-08-27: 1 via RECTAL
  Filled 2020-08-27: qty 1

## 2020-08-27 NOTE — Progress Notes (Signed)
PROGRESS NOTE    Jamie Wilson  FUX:[id number] DOB: Aug 28, 1956 DOA: 08/16/2020 PCP: Patient, [name]    Brief Narrative:  64 year old gentleman with history of bowel obstruction status post partial bowel resection and colostomy presented to the ER with nausea, vomiting and abdominal pain.  Also has history of urinary obstruction and was doing intermittent self cath at home.  Lives in a boardinghouse. In the emergency room hemodynamically stable.  A CT scan showed a small bowel herniation through the left lower quadrant ostomy defect causing high-grade small bowel obstruction.  Right nephrolithiasis without hydronephrosis.  NGT was placed with improvement of symptoms.   Assessment & Plan:   Principal Problem:   Small bowel obstruction (HCC) Active Problems:   Hypercalcemia   AKI (acute kidney injury) ([place])   Right nephrolithiasis  Small bowel obstruction secondary herniation through ostomy: Treated conservatively.  Followed by surgery. Large amount of the stool after enema through colostomy and none since then.  Plan for repeat enema today. Patient gets bowel movement with enema but no spontaneous bowel movements. Tolerating soft diet. Occasional use of pain medications.  Acute kidney injury, chronic recurrent urinary obstruction: Patient with history of BPH and intermittent self cath.  Recurrent urinary retention. Second Foley catheter in the hospital. Continue Flomax, will with Foley catheter with outpatient follow-up with urology for voiding trial. Continue flushing catheter twice a day to avoid sediments buildup.  E. coli UTI present on admission due to intermittent self-catheterization: Treated with Rocephin for 8 days.  Discontinued.  Abnormal EKG concern for pericarditis: Seen by cardiology.  Improved.  Electrolyte abnormalities including hypophosphatemia: All replaced and normalized.   DVT prophylaxis: heparin injection 5,000 Units Start: 08/18/20 1400 SCDs Start:  08/17/20 0001   Code Status: Full code Family Communication: None Disposition Plan: Status is: Inpatient  Remains inpatient appropriate because:Inpatient level of care appropriate due to severity of illness   Dispo: The patient is from: Group home              Anticipated d/c is to: Group home              Anticipated d/c date is: 1 to 2 days.              Patient currently is not medically stable to d/c. Waiting to have spontaneous bowel movement in his colostomy bag before discharge.        Consultants:   General surgery  Procedures:   None  Antimicrobials:   Rocephin, 10/20--- 10/27   Subjective: Seen and examined.  Overnight had some pain and needed to use some Dilaudid. Tolerating soft diet.  Happy to eat regular food.  No nausea or vomiting. Good results with enema through the colostomy and none since then.  Objective: Vitals:   08/26/20 1417 08/26/20 2052 08/27/20 0647 08/27/20 0800  BP: 99/66 95/60 107/61   Pulse: 100 82 92   Resp: 18 18    Temp: 98.1 F (36.7 C) 97.7 F (36.5 C) 98.4 F (36.9 C)   TempSrc:  Oral Oral   SpO2: 95% 97% 97% 98%  Weight:      Height:        Intake/Output Summary (Last 24 hours) at 08/27/2020 1006 Last data filed at 08/27/2020 0650 Gross per 24 hour  Intake --  Output 2050 ml  Net -2050 ml   Filed Weights   08/23/20 0500 08/24/20 0500 08/25/20 0500  Weight: 61.2 kg 59 kg 59.6 kg    Examination:  General exam: Appears calm and comfortable .  Not in any distress. Respiratory system: Clear to auscultation. Respiratory effort normal. Cardiovascular system: S1 & S2 heard, RRR. No JVD, murmurs, rubs, gallops or clicks. No pedal edema. Gastrointestinal system: Mildly distended around the stoma. Nontender and no rigidity or guarding. Colostomy present.  There is about 10 mL of loose stool in colostomy bag along with some tinge of blood. Central nervous system: Alert and oriented. No focal neurological  deficits. Extremities: Symmetric 5 x 5 power. Skin: No rashes, lesions or ulcers Psychiatry: Judgement and insight appear normal. Mood & affect appropriate.  Foley catheter with plenty of sediments on it.   Data Reviewed: I have personally reviewed following labs and imaging studies  CBC: Recent Labs  Lab 08/23/20 1357 08/24/20 0411 08/25/20 0335 08/26/20 0401 08/27/20 0412  WBC 12.3* 12.6* 13.8* 14.2* 14.6*  NEUTROABS 5.6 5.7 6.5 7.0 6.8  HGB 12.7* 12.0* 11.9* 12.2* 11.1*  HCT 39.9 38.1* 37.4* 38.0* 36.0*  MCV 93.2 93.4 93.3 93.6 96.3  PLT 260 268 289 301 098   Basic Metabolic Panel: Recent Labs  Lab 08/21/20 0525 08/21/20 0525 08/22/20 0431 08/22/20 0431 08/23/20 0425 08/24/20 0411 08/25/20 0335 08/26/20 0401 08/27/20 0412  NA 134*   < > 134*   < > 136 136 134* 134* 135  K 4.2   < > 4.5   < > 5.0 4.6 4.5 4.4 4.4  CL 105   < > 107   < > 106 104 102 100 105  CO2 20*   < > 23   < > 23 22 25 26 23   GLUCOSE 90   < > 100*   < > 114* 118* 106* 106* 119*  BUN 9   < > 9   < > 12 12 16 17 18   CREATININE 0.77   < > 0.79   < > 0.82 0.95 0.91 0.98 0.95  CALCIUM 7.9*   < > 8.5*   < > 9.5 10.0 10.3 10.7* 10.0  MG 2.1  --  1.9  --  1.8 2.0 1.9  --   --   PHOS 1.2*  --  1.9*  --  2.4* 2.3*  --   --   --    < > = values in this interval not displayed.   GFR: Estimated Creatinine Clearance: 65.4 mL/min (by C-G formula based on SCr of 0.95 mg/dL). Liver Function Tests: Recent Labs  Lab 08/21/20 0525 08/22/20 0431 08/23/20 0425 08/24/20 0411  AST 11* 14* 17 23  ALT 9 12 18 23   ALKPHOS 62 60 69 76  BILITOT 0.4 0.4 0.5 0.3  PROT 6.0* 6.0* 6.4* 6.6  ALBUMIN 2.5* 2.5* 2.6* 2.8*   No results for input(s): LIPASE, AMYLASE in the last 168 hours. No results for input(s): AMMONIA in the last 168 hours. Coagulation Profile: No results for input(s): INR, PROTIME in the last 168 hours. Cardiac Enzymes: No results for input(s): CKTOTAL, CKMB, CKMBINDEX, TROPONINI in the last 168  hours. BNP (last 3 results) No results for input(s): PROBNP in the last 8760 hours. HbA1C: No results for input(s): HGBA1C in the last 72 hours. CBG: No results for input(s): GLUCAP in the last 168 hours. Lipid Profile: No results for input(s): CHOL, HDL, LDLCALC, TRIG, CHOLHDL, LDLDIRECT in the last 72 hours. Thyroid Function Tests: No results for input(s): TSH, T4TOTAL, FREET4, T3FREE, THYROIDAB in the last 72 hours. Anemia Panel: No results for input(s): VITAMINB12, FOLATE, FERRITIN, TIBC, IRON, RETICCTPCT in  the last 72 hours. Sepsis Labs: No results for input(s): PROCALCITON, LATICACIDVEN in the last 168 hours.  No results found for this or any previous visit (from the past 240 hour(s)).       Radiology Studies: No results found.      Scheduled Meds: . Chlorhexidine Gluconate Cloth  6 each Topical Daily  . feeding supplement  237 mL Oral BID BM  . heparin injection (subcutaneous)  5,000 Units Subcutaneous Q8H  . pantoprazole  40 mg Oral BID AC  . polyethylene glycol  17 g Oral BID  . tamsulosin  0.4 mg Oral Daily   Continuous Infusions: . sodium chloride 50 mL/hr at 08/25/20 2213     LOS: 11 days    Time spent: 30 minutes    Barb Merino, MD [facility] Pager [phone]

## 2020-08-27 NOTE — Progress Notes (Signed)
Progress Note: General Surgery Service   Chief Complaint/Subjective: Lower abdominal pain this morning, tolerating diet well, no nausea  Objective: Vital signs in last 24 hours: Temp:  [97.7 F (36.5 C)-98.4 F (36.9 C)] 98.4 F (36.9 C) (10/30 0647) Pulse Rate:  [82-100] 92 (10/30 0647) Resp:  [18] 18 (10/29 2052) BP: (95-107)/(60-66) 107/61 (10/30 0647) SpO2:  [95 %-97 %] 97 % (10/30 0647) Last BM Date: 08/26/20 (post SMOG ENEMA)  Intake/Output from previous day: 10/29 0701 - 10/30 0700 In: -  Out: 2825 [Urine:2750; Stool:75] Intake/Output this shift: No intake/output data recorded.  Gen: NAd  Resp: nonlabored  Card: RRR  Abd: soft, ostomy pink with some liquid output, parastomal hernia reduced during visit, moderate distension  Lab Results: CBC  Recent Labs    08/26/20 0401 08/27/20 0412  WBC 14.2* 14.6*  HGB 12.2* 11.1*  HCT 38.0* 36.0*  PLT 301 304   BMET Recent Labs    08/26/20 0401 08/27/20 0412  NA 134* 135  K 4.4 4.4  CL 100 105  CO2 26 23  GLUCOSE 106* 119*  BUN 17 18  CREATININE 0.98 0.95  CALCIUM 10.7* 10.0   PT/INR No results for input(s): LABPROT, INR in the last 72 hours. ABG No results for input(s): PHART, HCO3 in the last 72 hours.  Invalid input(s): PCO2, PO2  Anti-infectives: Anti-infectives (From admission, onward)   Start     Dose/Rate Route Frequency Ordered Stop   08/17/20 0930  cefTRIAXone (ROCEPHIN) 1 g in sodium chloride 0.9 % 100 mL IVPB  Status:  Discontinued        1 g 200 mL/hr over 30 Minutes Intravenous Every 24 hours 08/17/20 0856 08/24/20 1017      Medications: Scheduled Meds:  Chlorhexidine Gluconate Cloth  6 each Topical Daily   feeding supplement  237 mL Oral BID BM   heparin injection (subcutaneous)  5,000 Units Subcutaneous Q8H   pantoprazole  40 mg Oral BID AC   polyethylene glycol  17 g Oral BID   sodium phosphate  1 enema Rectal Once   tamsulosin  0.4 mg Oral Daily   Continuous  Infusions:  sodium chloride 50 mL/hr at 08/25/20 2213   PRN Meds:.acetaminophen, HYDROmorphone (DILAUDID) injection, ondansetron **OR** ondansetron (ZOFRAN) IV, opium-belladonna, oxyCODONE  Assessment/Plan: SBO related to ostomy, parastomal hernia Constipation -xray 10/27 showed moderate stool burden in the right colon and dilated small bowel more consistent with ileus. On look out for hernia recurrence when patient is distended - good results with enema 10/27 - Repeat enema today per stoma. Continue diet and bowel regimen.  ID -rocephin 10/20>>10/27 FEN -IVF,reg diet, Ensure/Boost VTE -SCDs,sq heparin Foley -replaced10/25 Follow up -TBD    LOS: 11 days   Mickeal Skinner, MD [facility]

## 2020-08-28 DIAGNOSIS — R338 Other retention of urine: Secondary | ICD-10-CM | POA: Diagnosis present

## 2020-08-28 LAB — CBC WITH DIFFERENTIAL/PLATELET
Abs Immature Granulocytes: 0.06 10*3/uL (ref 0.00–0.07)
Basophils Absolute: 0.1 10*3/uL (ref 0.0–0.1)
Basophils Relative: 1 %
Eosinophils Absolute: 4.8 10*3/uL — ABNORMAL HIGH (ref 0.0–0.5)
Eosinophils Relative: 30 %
HCT: 36.3 % — ABNORMAL LOW (ref 39.0–52.0)
Hemoglobin: 11.3 g/dL — ABNORMAL LOW (ref 13.0–17.0)
Immature Granulocytes: 0 %
Lymphocytes Relative: 9 %
Lymphs Abs: 1.4 10*3/uL (ref 0.7–4.0)
MCH: 29.6 pg (ref 26.0–34.0)
MCHC: 31.1 g/dL (ref 30.0–36.0)
MCV: 95 fL (ref 80.0–100.0)
Monocytes Absolute: 0.9 10*3/uL (ref 0.1–1.0)
Monocytes Relative: 5 %
Neutro Abs: 8.9 10*3/uL — ABNORMAL HIGH (ref 1.7–7.7)
Neutrophils Relative %: 55 %
Platelets: 352 10*3/uL (ref 150–400)
RBC: 3.82 MIL/uL — ABNORMAL LOW (ref 4.22–5.81)
RDW: 13.6 % (ref 11.5–15.5)
WBC: 16.1 10*3/uL — ABNORMAL HIGH (ref 4.0–10.5)
nRBC: 0 % (ref 0.0–0.2)

## 2020-08-28 MED ORDER — TAMSULOSIN HCL 0.4 MG PO CAPS: 0.4000 mg | ORAL_CAPSULE | Freq: Every day | ORAL | 2 refills | Status: AC

## 2020-08-28 MED ORDER — POLYETHYLENE GLYCOL 3350 17 G PO PACK: 17.0000 g | PACK | Freq: Two times a day (BID) | ORAL | 0 refills | Status: AC

## 2020-08-28 MED ORDER — CEPHALEXIN 500 MG PO CAPS: 500.0000 mg | ORAL_CAPSULE | Freq: Three times a day (TID) | ORAL | 0 refills | Status: AC

## 2020-08-28 MED ORDER — ACETAMINOPHEN 325 MG PO TABS
650.0000 mg | ORAL_TABLET | Freq: Four times a day (QID) | ORAL | Status: DC | PRN
Start: 2020-08-28 — End: 2020-10-13

## 2020-08-28 NOTE — Progress Notes (Signed)
Patient aware of discharge orders earlier today, stated his ride will be off work around [employer] and will transport him home. Friend Randall Hiss is transport, Randall Hiss did visit this morning and confirm he would pick patient up. IV removed from RFA, tele removed, patient assisted with cleaning up and dressing. Discharge paperwork provided as well as paper perscription with medication instructions. Patient denies any questions or concerns.   Currently patient is still waiting on his friend Randall Hiss to transport him. States he will call him to find out his ETA.

## 2020-08-28 NOTE — Progress Notes (Signed)
S: spontaneous liquid stool yesterday mid day that has continues, lower abdominal pain resolved and urine has also improved O: BP 122/66 (BP Location: Left Arm)   Pulse 91   Temp 98.5 F (36.9 C) (Oral)   Resp 20   Ht 6\' 1"  (1.854 m)   Wt 61.1 kg   SpO2 96%   BMI 17.77 kg/m  Gen: NAD Neuro: AOx4, talkative Abd: soft, ostomy pink with liquid stool in bag  A/P 64 yo male with parastomal hernia that had led to obstruction then had prolonged ileus vs constipation. This seems improved today -ok with discharge from surgery standpoint -will make follow up for ostomy/parastomal hernia

## 2020-08-28 NOTE — Discharge Summary (Signed)
Physician Discharge Summary  Jamie Wilson QIO:[id number] DOB: 17-Mar-1956 DOA: 08/16/2020  PCP: Patient, [name]  Admit date: 08/16/2020 Discharge date: 08/28/2020  Admitted From: Home Disposition: Home, boarding home  Recommendations for Outpatient Follow-up:  1. Follow up with PCP in 1-2 weeks 2. Please obtain BMP/CBC in one week  Home Health: Not applicable Equipment/Devices: In place  Discharge Condition: Fair CODE STATUS: Full code Diet recommendation: Regular diet  Discharge summary: 64 year old gentleman with history of bowel obstruction status post partial bowel resection and colostomy presented to the ER with nausea, vomiting and abdominal pain.  Also has history of urinary obstruction and was doing intermittent self cath at home.  Lives in a boardinghouse. In the emergency room hemodynamically stable.  A CT scan showed a small bowel herniation through the left lower quadrant ostomy defect causing high-grade small bowel obstruction.  Right nephrolithiasis without hydronephrosis.  NGT was placed with improvement of symptoms. Urine culture positive for E. coli.  Remains in the hospital with not having infected bowel movements on his colostomy bag.  Seen and followed by surgery.  Currently with improvement.  Small bowel obstruction secondary herniation through ostomy: Treated conservatively.  Followed by surgery. He was treated with laxatives, enema through the colostomy.  Now spontaneous bowel movement with normal bowel function.  Tolerating regular diet. As per surgical plan, going home with regular diet, will discharge with scheduled laxative to avoid constipation.  He will be followed by surgery outpatient.  Acute kidney injury, chronic recurrent urinary obstruction: Patient with history of BPH and intermittent self cath.  Recurrent urinary retention. Second Foley catheter in the hospital. Continue Flomax, will discharge with Foley catheter with outpatient follow-up with  urology for voiding trial. Continue flushing catheter twice a day to avoid sediments buildup.  E. coli UTI present on admission due to intermittent self-catheterization: Treated with Rocephin for 8 days.  Discontinued. After clinical improvement, he continues to have elevated WBC count.  No other source of bacterial infection. Presence of Foley catheter and nephrolithiasis without hydronephrosis, will treat as complicated UTI. E. coli sensitive to cephalosporins.  Will treat with extended course of cephalosporin, 7 more days of therapy at home.  Abnormal EKG concern for pericarditis: Seen by cardiology.  Improved.  Electrolyte abnormalities including hypophosphatemia: All replaced and normalized.  Patient remains clinically stable.  Today he has improved bowel function.  His urinary flow in the catheter is improving with less sediments.  He has slightly worse WBC count however no fever or dysuria.  We will treat with extended course of Keflex to cover for complicated UTI and he is able to be discharged home. Referral sent to urology for follow-up. He will need repeat CBC and renal function test in about 1 to 2 weeks to ensure stabilization.  Seen with his manager at the bedside who manages his boardinghouse.  Patient with complicated medical need.  Colostomy for stool output and Foley catheter for urine output.  He will benefit with more accessible care and accessible devices/bathrooms at his boardinghouse.   Discharge Diagnoses:  Principal Problem:   Small bowel obstruction ([place]) Active Problems:   Hypercalcemia   AKI (acute kidney injury) ([place])   Right nephrolithiasis   Acute urinary retention    Discharge Instructions  Discharge Instructions    Ambulatory referral to Urology   Complete by: As directed    Urinary retention, going home with foley catheter   Diet general   Complete by: As directed    Discharge instructions  Complete by: As directed    You are going home  with Foley catheter, all sterile precautions.  Flush catheter with medically supplied sterile water if needed to clear sediments.   Increase activity slowly   Complete by: As directed      Allergies as of 08/28/2020   No Known Allergies     Medication List    STOP taking these medications   sulfamethoxazole-trimethoprim 800-160 MG tablet Commonly known as: BACTRIM DS     TAKE these medications   acetaminophen 325 MG tablet Commonly known as: TYLENOL Take 2 tablets (650 mg total) by mouth every 6 (six) hours as needed for mild pain.   cephALEXin 500 MG capsule Commonly known as: KEFLEX Take 1 capsule (500 mg total) by mouth 3 (three) times daily for 7 days. What changed: when to take this   polyethylene glycol 17 g packet Commonly known as: MIRALAX / GLYCOLAX Take 17 g by mouth 2 (two) times daily.   tamsulosin 0.4 MG Caps capsule Commonly known as: FLOMAX Take 1 capsule (0.4 mg total) by mouth daily.       Follow-up Information    [facility] Follow up on 09/19/2020.   Specialty: Cardiology Why: Please arrive 15 minutes early for your 8:00am post-hospital cardiology follow-up appointment with Dr. Casilda Carls information: [address]       Kinsinger, Arta Bruce, MD Follow up in 1 month(s).   Specialty: General Surgery Why: Call to make appointment to discuss ostomy/hernia options Contact information: [place] [address] [phone]              No Known Allergies  Consultations:  General surgery   Procedures/Studies: DG Abd 1 View  Result Date: 08/21/2020 CLINICAL DATA:  SBO, abdominal distension EXAM: ABDOMEN - 1 VIEW COMPARISON:  Plain film of the abdomen dated 08/20/2020. CT abdomen dated 08/16/2020. FINDINGS: Distended gas-filled loops of small and large bowel are again seen throughout the abdomen and upper pelvis.  LEFT lower quadrant ostomy. No evidence of free intraperitoneal air is seen. IMPRESSION: Distended gas-filled loops of bowel throughout the abdomen and upper pelvis, some component of which corresponds to the small-bowel obstruction demonstrated on earlier CT abdomen of 08/16/2020. Electronically Signed   By: Franki Cabot M.D.   On: 08/21/2020 06:24   DG Abd 1 View  Result Date: 08/20/2020 CLINICAL DATA:  Small bowel obstruction EXAM: ABDOMEN - 1 VIEW COMPARISON:  Yesterday FINDINGS: Unchanged gaseous colonic distension above a colostomy. The enteric tube tip is at the stomach with side port near the GE junction. No concerning mass effect or gas collection. IMPRESSION: Unchanged gaseous colonic distension above a colostomy. No gas dilated small bowel. Electronically Signed   By: Monte Fantasia M.D.   On: 08/20/2020 06:53   DG Abd 1 View  Result Date: 08/18/2020 CLINICAL DATA:  Small-bowel obstruction. EXAM: ABDOMEN - 1 VIEW COMPARISON:  08/17/2020. FINDINGS: NG tube noted in stable position with its tip over the stomach. Interim improvement of small-bowel distention. Air noted throughout the colon. No free air identified. Possibly noted over the left lower quadrant. Surgical clips noted over the pelvis. No acute bony abnormality. IMPRESSION: 1. NG tube noted in stable position with its tip over the stomach. 2. Interim improvement of small-bowel distention. Air noted throughout the colon. No free air identified. Electronically Signed   By: Marcello Moores  Register   On: 08/18/2020  05:48   DG Abd 1 View  Result Date: 08/17/2020 CLINICAL DATA:  Small-bowel obstruction. EXAM: ABDOMEN - 1 VIEW COMPARISON:  08/16/2020.  CT 08/16/2020. FINDINGS: NG tube tip noted over the upper stomach in unchanged position. Persistent prominently dilated loops of small bowel are noted. Ostomy site over the left lower quadrant noted. Surgical clips over the pelvis noted. Contrast in the bladder. Degenerative change lumbar spine  and both hips. IMPRESSION: NG tube tip noted over the upper stomach in unchanged position. Persistent prominently dilated loops of small bowel noted without interim change and consistent with small bowel obstruction. Electronically Signed   By: Marcello Moores  Register   On: 08/17/2020 05:40   DG Abdomen 1 View  Result Date: 08/16/2020 CLINICAL DATA:  NG tube placement EXAM: ABDOMEN - 1 VIEW COMPARISON:  CT 08/16/2020 FINDINGS: An enteric tube has been placed with tip in the left upper quadrant consistent with location in the upper stomach. Proximal side hole is positioned at or just below the level of the EG junction. Gas-filled dilated small bowel seen in the upper abdomen consistent with small bowel obstruction. Lung bases are clear. IMPRESSION: Enteric tube tip is in the left upper quadrant consistent with location in the upper stomach. Electronically Signed   By: Lucienne Capers M.D.   On: 08/16/2020 22:15   CT ABDOMEN PELVIS W CONTRAST  Result Date: 08/16/2020 CLINICAL DATA:  Nausea, vomiting.  Bowel obstruction suspected EXAM: CT ABDOMEN AND PELVIS WITH CONTRAST TECHNIQUE: Multidetector CT imaging of the abdomen and pelvis was performed using the standard protocol following bolus administration of intravenous contrast. CONTRAST:  130mL OMNIPAQUE IOHEXOL 300 MG/ML  SOLN COMPARISON:  11/15/2017 FINDINGS: Lower chest: Linear density peripherally in the right lung base, likely atelectasis. No effusions. Hepatobiliary: Numerous cysts throughout the liver. Gallbladder unremarkable. Pancreas: No focal abnormality or ductal dilatation. Spleen: No focal abnormality.  Normal size. Adrenals/Urinary Tract: Multiple calcifications in the right kidney. No obstruction. Areas of cortical thinning and scarring in the right kidney. Left renal cyst in the midpole measures 4 cm. No hydronephrosis. Adrenal glands unremarkable. Bladder wall thickening measuring up to 12 mm on coronal imaging. Layering high-density material  posteriorly in the bladder, likely numerous stones. Areas of calcification also noted linearly within the lumen of the bladder of unknown etiology. These could be along the thickened trabeculated bladder wall, but difficult to evaluate due to bladder decompression. Stomach/Bowel: Previously seen left lower quadrant ostomy has been taken down. There is herniation of small bowel loops through the ostomy defect. Dilated small bowel loops compatible with high-grade obstruction. Distal small bowel loops are decompressed. Stomach is dilated and fluid-filled. Large bowel decompressed. Vascular/Lymphatic: Aortic atherosclerosis. Reproductive: Mildly prominent prostate. Other: No free fluid or free air. Musculoskeletal: No acute bony abnormality. IMPRESSION: Small bowel herniation through the left lower quadrant the ostomy defect causing high-grade small bowel obstruction. Right nephrolithiasis. No hydronephrosis. Layering high-density material posteriorly in the urinary bladder, likely small layering stones. Markedly thickened bladder wall abnormal calcifications seen within the lumen of the bladder, difficult to determine exact source and location. These could be along the thickened bladder wall. Aortic atherosclerosis. Hepatic and renal cysts. Electronically Signed   By: Rolm Baptise M.D.   On: 08/16/2020 21:33   DG Chest Port 1 View  Result Date: 08/16/2020 CLINICAL DATA:  Shortness of breath EXAM: PORTABLE CHEST 1 VIEW COMPARISON:  11/15/2017 FINDINGS: Right base atelectasis. Left lung clear. Heart is normal size. No effusions. No acute bony abnormality. IMPRESSION: Right base  atelectasis. Electronically Signed   By: Rolm Baptise M.D.   On: 08/16/2020 20:34   DG Abd Portable 1V  Result Date: 08/24/2020 CLINICAL DATA:  Small bowel obstruction EXAM: PORTABLE ABDOMEN - 1 VIEW COMPARISON:  August 21, 2020; CT abdomen and pelvis August 16, 2020 FINDINGS: Moderate stool is seen in the colon. There remain loops of  mildly dilated small bowel which raises question of a degree of residual small bowel obstruction. No free air evident. Visualized lung bases are clear. Postoperative change noted in the pelvis. IMPRESSION: Persistent loops of mildly dilated bowel, similar to most recent study and suggesting a residual degree of bowel obstruction. No free air. Moderate stool in colon. Lung bases clear. Electronically Signed   By: Lowella Grip III M.D.   On: 08/24/2020 08:33   DG Abd Portable 1V  Result Date: 08/19/2020 CLINICAL DATA:  Distended abdomen EXAM: PORTABLE ABDOMEN - 1 VIEW COMPARISON:  08/18/2020 FINDINGS: Gas in nondilated colon.  Small bowel nondilated. Left colostomy noted. NG tube in the body the stomach. Ventral hernia repair with mesh. IMPRESSION: Nonobstructive bowel gas pattern. Air throughout the colon unchanged. Electronically Signed   By: Franchot Gallo M.D.   On: 08/19/2020 08:18   ECHOCARDIOGRAM COMPLETE  Result Date: 08/17/2020    ECHOCARDIOGRAM REPORT   Patient Name:   ELIZAR Florio Date of Exam: 08/17/2020 Medical Rec #:  [id number]  Height:       73.0 in Accession #:    [id number] Weight:       149.9 lb Date of Birth:  1956-04-21 BSA:          1.904 m Patient Age:    70 years   BP:           105/59 mmHg Patient Gender: M          HR:           100 bpm. Exam Location:  Inpatient Procedure: 2D Echo Indications:    Chest Pain R07.9  History:        Patient has no prior history of Echocardiogram examinations.                 Risk Factors:Current Smoker.  Sonographer:    Mikki Santee RDCS (AE) Referring Phys: [id number] [place]  1. Left ventricular ejection fraction, by estimation, is 60 to 65%. The left ventricle has normal function. The left ventricle has no regional wall motion abnormalities. Left ventricular diastolic parameters were normal.  2. Right ventricular systolic function is normal. The right ventricular size is normal. Tricuspid regurgitation signal is  inadequate for assessing PA pressure.  3. The mitral valve is normal in structure. Trivial mitral valve regurgitation. No evidence of mitral stenosis.  4. The aortic valve is grossly normal. There is mild calcification of the aortic valve. Aortic valve regurgitation is not visualized. No aortic stenosis is present.  5. The inferior vena cava is dilated in size with <50% respiratory variability, suggesting right atrial pressure of 15 mmHg. Comparison(s): No prior Echocardiogram. Conclusion(s)/Recommendation(s): Normal biventricular function without evidence of hemodynamically significant valvular heart disease. FINDINGS  Left Ventricle: Left ventricular ejection fraction, by estimation, is 60 to 65%. The left ventricle has normal function. The left ventricle has no regional wall motion abnormalities. The left ventricular internal cavity size was normal in size. There is  no left ventricular hypertrophy. Left ventricular diastolic parameters were normal. Right Ventricle: The right ventricular size is normal. No increase in right ventricular wall thickness.  Right ventricular systolic function is normal. Tricuspid regurgitation signal is inadequate for assessing PA pressure. Left Atrium: Left atrial size was normal in size. Right Atrium: Right atrial size was normal in size. Pericardium: There is no evidence of pericardial effusion. Presence of pericardial fat pad. Mitral Valve: The mitral valve is normal in structure. Trivial mitral valve regurgitation. No evidence of mitral valve stenosis. Tricuspid Valve: The tricuspid valve is normal in structure. Tricuspid valve regurgitation is trivial. No evidence of tricuspid stenosis. Aortic Valve: The aortic valve is grossly normal. There is mild calcification of the aortic valve. Aortic valve regurgitation is not visualized. No aortic stenosis is present. Pulmonic Valve: The pulmonic valve was not well visualized. Pulmonic valve regurgitation is not visualized. Aorta: The  aortic root and ascending aorta are structurally normal, with no evidence of dilitation. Venous: The inferior vena cava is dilated in size with less than 50% respiratory variability, suggesting right atrial pressure of 15 mmHg. IAS/Shunts: The atrial septum is grossly normal.  LEFT VENTRICLE PLAX 2D LVIDd:         3.80 cm LVIDs:         2.60 cm LV PW:         0.90 cm LV IVS:        0.90 cm LVOT diam:     2.00 cm LV SV:         43 LV SV Index:   23 LVOT Area:     3.14 cm  RIGHT VENTRICLE TAPSE (M-mode): 1.6 cm LEFT ATRIUM           Index LA diam:      2.50 cm 1.31 cm/m LA Vol (A2C): 6.1 ml  3.20 ml/m LA Vol (A4C): 13.4 ml 7.04 ml/m  AORTIC VALVE LVOT Vmax:   89.40 cm/s LVOT Vmean:  57.900 cm/s LVOT VTI:    0.137 m  AORTA Ao Root diam: 2.90 cm MITRAL VALVE MV Area (PHT): 3.27 cm    SHUNTS MV Decel Time: 232 msec    Systemic VTI:  0.14 m MV E velocity: 66.00 cm/s  Systemic Diam: 2.00 cm MV A velocity: 83.90 cm/s MV E/A ratio:  0.79 Buford Dresser MD Electronically signed by Buford Dresser MD Signature Date/Time: 08/17/2020/10:12:58 PM    Final     (Echo, Carotid, EGD, Colonoscopy, ERCP)    Subjective: Patient seen and examined.  Met with his boardinghouse manager at bedside.  Patient denies any abdominal pain.  He is eating regular diet.  He has adequate stool in his colostomy. Ready to go home.   Discharge Exam: Vitals:   08/27/20 2033 08/28/20 0403  BP: 99/65 122/66  Pulse: 87 91  Resp: 16 20  Temp: 97.9 F (36.6 C) 98.5 F (36.9 C)  SpO2: 96% 96%   Vitals:   08/27/20 0800 08/27/20 1420 08/27/20 2033 08/28/20 0403  BP:  92/64 99/65 122/66  Pulse:  81 87 91  Resp:  $Remo'17 16 20  'AAtBe$ Temp:  98.6 F (37 C) 97.9 F (36.6 C) 98.5 F (36.9 C)  TempSrc:   Oral Oral  SpO2: 98% 97% 96% 96%  Weight:    61.1 kg  Height:        General: Pt is alert, awake, not in acute distress Chronically sick looking but not in any distress.  Talkative. Cardiovascular: RRR, S1/S2 +, no rubs, no  gallops Respiratory: CTA bilaterally, no wheezing, no rhonchi Abdominal: Soft, NT, ND, bowel sounds + Colostomy bag with loose stool.  No blood.  He has some palpable swelling around the stoma but nontender. Foley catheter with some sediments but flowing freely. Extremities: no edema, no cyanosis    The results of significant diagnostics from this hospitalization (including imaging, microbiology, ancillary and laboratory) are listed below for reference.     Microbiology: No results found for this or any previous visit (from the past 240 hour(s)).   Labs: BNP (last 3 results) No results for input(s): BNP in the last 8760 hours. Basic Metabolic Panel: Recent Labs  Lab 08/22/20 0431 08/22/20 0431 08/23/20 0425 08/24/20 0411 08/25/20 0335 08/26/20 0401 08/27/20 0412  NA 134*   < > 136 136 134* 134* 135  K 4.5   < > 5.0 4.6 4.5 4.4 4.4  CL 107   < > 106 104 102 100 105  CO2 23   < > $R'23 22 25 26 23  'bl$ GLUCOSE 100*   < > 114* 118* 106* 106* 119*  BUN 9   < > $R'12 12 16 17 18  'IJ$ CREATININE 0.79   < > 0.82 0.95 0.91 0.98 0.95  CALCIUM 8.5*   < > 9.5 10.0 10.3 10.7* 10.0  MG 1.9  --  1.8 2.0 1.9  --   --   PHOS 1.9*  --  2.4* 2.3*  --   --   --    < > = values in this interval not displayed.   Liver Function Tests: Recent Labs  Lab 08/22/20 0431 08/23/20 0425 08/24/20 0411  AST 14* 17 23  ALT $Re'12 18 23  'IDb$ ALKPHOS 60 69 76  BILITOT 0.4 0.5 0.3  PROT 6.0* 6.4* 6.6  ALBUMIN 2.5* 2.6* 2.8*   No results for input(s): LIPASE, AMYLASE in the last 168 hours. No results for input(s): AMMONIA in the last 168 hours. CBC: Recent Labs  Lab 08/24/20 0411 08/25/20 0335 08/26/20 0401 08/27/20 0412 08/28/20 0605  WBC 12.6* 13.8* 14.2* 14.6* 16.1*  NEUTROABS 5.7 6.5 7.0 6.8 8.9*  HGB 12.0* 11.9* 12.2* 11.1* 11.3*  HCT 38.1* 37.4* 38.0* 36.0* 36.3*  MCV 93.4 93.3 93.6 96.3 95.0  PLT 268 289 301 304 352   Cardiac Enzymes: No results for input(s): CKTOTAL, CKMB, CKMBINDEX, TROPONINI in  the last 168 hours. BNP: Invalid input(s): POCBNP CBG: No results for input(s): GLUCAP in the last 168 hours. D-Dimer No results for input(s): DDIMER in the last 72 hours. Hgb A1c No results for input(s): HGBA1C in the last 72 hours. Lipid Profile No results for input(s): CHOL, HDL, LDLCALC, TRIG, CHOLHDL, LDLDIRECT in the last 72 hours. Thyroid function studies No results for input(s): TSH, T4TOTAL, T3FREE, THYROIDAB in the last 72 hours.  Invalid input(s): FREET3 Anemia work up No results for input(s): VITAMINB12, FOLATE, FERRITIN, TIBC, IRON, RETICCTPCT in the last 72 hours. Urinalysis    Component Value Date/Time   COLORURINE YELLOW 08/17/2020 0300   APPEARANCEUR CLOUDY (A) 08/17/2020 0300   LABSPEC >1.046 (H) 08/17/2020 0300   PHURINE 7.0 08/17/2020 0300   GLUCOSEU NEGATIVE 08/17/2020 0300   HGBUR MODERATE (A) 08/17/2020 0300   BILIRUBINUR NEGATIVE 08/17/2020 0300   KETONESUR 5 (A) 08/17/2020 0300   PROTEINUR 100 (A) 08/17/2020 0300   NITRITE POSITIVE (A) 08/17/2020 0300   LEUKOCYTESUR MODERATE (A) 08/17/2020 0300   Sepsis Labs Invalid input(s): PROCALCITONIN,  WBC,  LACTICIDVEN Microbiology No results found for this or any previous visit (from the past 240 hour(s)).   Time coordinating discharge:  35 minutes  SIGNED:   Barb Merino, MD  [facility] 08/28/2020, 11:39 AM

## 2020-08-28 NOTE — Progress Notes (Signed)
Patient  was transported to the exit to be transported by car to his house. The patient's friend is driving him home. The patient has the discharge packet and paper prescription.

## 2020-09-17 NOTE — Progress Notes (Deleted)
Cardiology Office Note:    Date:  09/17/2020   ID:  Tyrail Grandfield, DOB Aug 10, 1956, MRN [id number]  PCP:  Patient, [name]  [facility] Cardiologist:  Freada Bergeron, MD  [place] Electrophysiologist:  None   Referring MD: No ref. provider found    History of Present Illness:    Keanen Dohse is a 64 y.o. male with a hx of with history of bowel obstruction s/p partial bowel resection with colostomy placement who we saw during his hospitalization at [facility] for concern for pericarditis now presenting to clinic for follow-up.  Patient was admitted at [facility] from 10/19-10/31 where he presented with nausea, vomiting, and abdominal pain. CT scan revealed herniation through the left lower quadrant ostomy. NGT placed with improvement. He was also treated for E.Coli UTI. During his hospital course, ECG was obtained which showed diffuse STE and PR depression consistent with possible pericarditis. Trop neg x2. TTE with normal EF, no WMA, normal diastolic function and no effusions. Patient was asymptomatic and therefore we did not continue colchicine/NSAIDs.   During that admission, he did report some stable angina symptoms for which he is following up today.    Past Medical History:  Diagnosis Date  . Colostomy care ([place])   . Toe infection     Past Surgical History:  Procedure Laterality Date  . ABDOMINAL SURGERY      Current Medications: No outpatient medications have been marked as taking for the 09/19/20 encounter (Appointment) with Freada Bergeron, MD.     Allergies:   Patient has no known allergies.   Social History   Socioeconomic History  . Marital status: Divorced    Spouse name: Not on file  . Number of children: Not on file  . Years of education: Not on file  . Highest education level: Not on file  Occupational History  . Not on file  Tobacco Use  . Smoking status: Current Some Day Smoker  . Smokeless tobacco: Never Used  Substance and Sexual Activity  .  Alcohol use: Yes  . Drug use: Yes    Types: Marijuana  . Sexual activity: Not on file  Other Topics Concern  . Not on file  Social History Narrative  . Not on file   Social Determinants of Health   Financial Resource Strain:   . Difficulty of Paying Living Expenses: Not on file  Food Insecurity:   . Worried About Charity fundraiser in the Last Year: Not on file  . Ran Out of Food in the Last Year: Not on file  Transportation Needs:   . Lack of Transportation (Medical): Not on file  . Lack of Transportation (Non-Medical): Not on file  Physical Activity:   . Days of Exercise per Week: Not on file  . Minutes of Exercise per Session: Not on file  Stress:   . Feeling of Stress : Not on file  Social Connections:   . Frequency of Communication with Friends and Family: Not on file  . Frequency of Social Gatherings with Friends and Family: Not on file  . Attends Religious Services: Not on file  . Active Member of Clubs or Organizations: Not on file  . Attends Archivist Meetings: Not on file  . Marital Status: Not on file     Family History: The patient's ***family history is negative for CAD and Heart failure.  ROS:   Please see the history of present illness.    *** All other systems reviewed  and are negative.  EKGs/Labs/Other Studies Reviewed:    The following studies were reviewed today: Echocardiogram 08/17/20: 1. Left ventricular ejection fraction, by estimation, is 60 to 65%. The  left ventricle has normal function. The left ventricle has no regional  wall motion abnormalities. Left ventricular diastolic parameters were  normal.  2. Right ventricular systolic function is normal. The right ventricular  size is normal. Tricuspid regurgitation signal is inadequate for assessing  PA pressure.  3. The mitral valve is normal in structure. Trivial mitral valve  regurgitation. No evidence of mitral stenosis.  4. The aortic valve is grossly normal. There is  mild calcification of the  aortic valve. Aortic valve regurgitation is not visualized. No aortic  stenosis is present.  5. The inferior vena cava is dilated in size with <50% respiratory  variability, suggesting right atrial pressure of 15 mmHg.   EKG:  EKG is *** ordered today.  The ekg ordered today demonstrates ***  Recent Labs: 08/24/2020: ALT 23 08/25/2020: Magnesium 1.9 08/27/2020: BUN 18; Creatinine, Ser 0.95; Potassium 4.4; Sodium 135 08/28/2020: Hemoglobin 11.3; Platelets 352  Recent Lipid Panel    Component Value Date/Time   CHOL 105 08/18/2020 0357   TRIG 74 08/18/2020 0357   HDL 39 (L) 08/18/2020 0357   CHOLHDL 2.7 08/18/2020 0357   VLDL 15 08/18/2020 0357   LDLCALC 51 08/18/2020 0357     Risk Assessment/Calculations:   {Does this patient have ATRIAL FIBRILLATION?:[phone]}   Physical Exam:    VS:  There were no vitals taken for this visit.    Wt Readings from Last 3 Encounters:  08/28/20 134 lb 11.2 oz (61.1 kg)  07/20/20 150 lb (68 kg)  06/26/20 150 lb (68 kg)     GEN: *** Well nourished, well developed in no acute distress HEENT: Normal NECK: No JVD; No carotid bruits LYMPHATICS: No lymphadenopathy CARDIAC: ***RRR, no murmurs, rubs, gallops RESPIRATORY:  Clear to auscultation without rales, wheezing or rhonchi  ABDOMEN: Soft, non-tender, non-distended MUSCULOSKELETAL:  No edema; No deformity  SKIN: Warm and dry NEUROLOGIC:  Alert and oriented x 3 PSYCHIATRIC:  Normal affect   ASSESSMENT:    No diagnosis found. PLAN:    In order of problems listed above:  #Chest Pain:    Shared Decision Making/Informed Consent   {Are you ordering a CV Procedure (e.g. stress test, cath, DCCV, TEE, etc)?   Press F2        :[id number]}    Medication Adjustments/Labs and Tests Ordered: Current medicines are reviewed at length with the patient today.  Concerns regarding medicines are outlined above.  No orders of the defined types were placed in this  encounter.  No orders of the defined types were placed in this encounter.   There are no Patient Instructions on file for this visit.   Signed, Freada Bergeron, MD  09/17/2020 5:01 PM    [place]

## 2020-09-19 ENCOUNTER — Ambulatory Visit: Payer: Medicaid Other | Admitting: Cardiology

## 2020-10-01 ENCOUNTER — Other Ambulatory Visit: Payer: Self-pay

## 2020-10-01 ENCOUNTER — Encounter (HOSPITAL_COMMUNITY): Payer: Self-pay

## 2020-10-01 DIAGNOSIS — R338 Other retention of urine: Secondary | ICD-10-CM | POA: Diagnosis present

## 2020-10-01 DIAGNOSIS — R310 Gross hematuria: Secondary | ICD-10-CM | POA: Diagnosis present

## 2020-10-01 DIAGNOSIS — D638 Anemia in other chronic diseases classified elsewhere: Secondary | ICD-10-CM | POA: Diagnosis present

## 2020-10-01 DIAGNOSIS — F17200 Nicotine dependence, unspecified, uncomplicated: Secondary | ICD-10-CM | POA: Diagnosis present

## 2020-10-01 DIAGNOSIS — E872 Acidosis: Secondary | ICD-10-CM | POA: Diagnosis not present

## 2020-10-01 DIAGNOSIS — Z933 Colostomy status: Secondary | ICD-10-CM

## 2020-10-01 DIAGNOSIS — E871 Hypo-osmolality and hyponatremia: Secondary | ICD-10-CM | POA: Diagnosis not present

## 2020-10-01 DIAGNOSIS — Z1623 Resistance to quinolones and fluoroquinolones: Secondary | ICD-10-CM | POA: Diagnosis present

## 2020-10-01 DIAGNOSIS — I959 Hypotension, unspecified: Secondary | ICD-10-CM | POA: Diagnosis not present

## 2020-10-01 DIAGNOSIS — A419 Sepsis, unspecified organism: Secondary | ICD-10-CM | POA: Diagnosis present

## 2020-10-01 DIAGNOSIS — N390 Urinary tract infection, site not specified: Secondary | ICD-10-CM | POA: Diagnosis present

## 2020-10-01 DIAGNOSIS — Z20822 Contact with and (suspected) exposure to covid-19: Secondary | ICD-10-CM | POA: Diagnosis present

## 2020-10-01 DIAGNOSIS — C679 Malignant neoplasm of bladder, unspecified: Principal | ICD-10-CM | POA: Diagnosis present

## 2020-10-01 DIAGNOSIS — Z681 Body mass index (BMI) 19 or less, adult: Secondary | ICD-10-CM

## 2020-10-01 DIAGNOSIS — N401 Enlarged prostate with lower urinary tract symptoms: Secondary | ICD-10-CM | POA: Diagnosis present

## 2020-10-01 DIAGNOSIS — E43 Unspecified severe protein-calorie malnutrition: Secondary | ICD-10-CM | POA: Diagnosis present

## 2020-10-01 DIAGNOSIS — N202 Calculus of kidney with calculus of ureter: Secondary | ICD-10-CM | POA: Diagnosis present

## 2020-10-01 DIAGNOSIS — E538 Deficiency of other specified B group vitamins: Secondary | ICD-10-CM | POA: Diagnosis present

## 2020-10-01 DIAGNOSIS — E860 Dehydration: Secondary | ICD-10-CM | POA: Diagnosis present

## 2020-10-01 DIAGNOSIS — Z79899 Other long term (current) drug therapy: Secondary | ICD-10-CM

## 2020-10-01 DIAGNOSIS — Z87442 Personal history of urinary calculi: Secondary | ICD-10-CM

## 2020-10-01 NOTE — ED Triage Notes (Signed)
Patient arrives complaining of bilateral flank pain with hematuria that has been going on for a month. Patient was given urology follow-up but did not make the appointment.

## 2020-10-02 ENCOUNTER — Emergency Department (HOSPITAL_COMMUNITY): Payer: Medicaid Other

## 2020-10-02 ENCOUNTER — Inpatient Hospital Stay (HOSPITAL_COMMUNITY)
Admission: EM | Admit: 2020-10-02 | Discharge: 2020-10-13 | DRG: 668 | Disposition: A | Discharge status: Home / Self Care | Payer: Medicaid Other | Attending: Internal Medicine | Admitting: Internal Medicine

## 2020-10-02 ENCOUNTER — Other Ambulatory Visit: Payer: Self-pay

## 2020-10-02 DIAGNOSIS — E46 Unspecified protein-calorie malnutrition: Secondary | ICD-10-CM | POA: Diagnosis present

## 2020-10-02 DIAGNOSIS — N3289 Other specified disorders of bladder: Secondary | ICD-10-CM | POA: Diagnosis present

## 2020-10-02 DIAGNOSIS — C679 Malignant neoplasm of bladder, unspecified: Secondary | ICD-10-CM | POA: Diagnosis present

## 2020-10-02 DIAGNOSIS — E872 Acidosis: Secondary | ICD-10-CM | POA: Diagnosis not present

## 2020-10-02 DIAGNOSIS — N201 Calculus of ureter: Secondary | ICD-10-CM

## 2020-10-02 DIAGNOSIS — R140 Abdominal distension (gaseous): Secondary | ICD-10-CM | POA: Diagnosis not present

## 2020-10-02 DIAGNOSIS — K469 Unspecified abdominal hernia without obstruction or gangrene: Secondary | ICD-10-CM

## 2020-10-02 DIAGNOSIS — E538 Deficiency of other specified B group vitamins: Secondary | ICD-10-CM | POA: Diagnosis present

## 2020-10-02 DIAGNOSIS — E43 Unspecified severe protein-calorie malnutrition: Secondary | ICD-10-CM | POA: Insufficient documentation

## 2020-10-02 DIAGNOSIS — Z87442 Personal history of urinary calculi: Secondary | ICD-10-CM | POA: Diagnosis not present

## 2020-10-02 DIAGNOSIS — N179 Acute kidney failure, unspecified: Secondary | ICD-10-CM

## 2020-10-02 DIAGNOSIS — R1084 Generalized abdominal pain: Secondary | ICD-10-CM | POA: Diagnosis not present

## 2020-10-02 DIAGNOSIS — R194 Change in bowel habit: Secondary | ICD-10-CM

## 2020-10-02 DIAGNOSIS — F17200 Nicotine dependence, unspecified, uncomplicated: Secondary | ICD-10-CM | POA: Diagnosis present

## 2020-10-02 DIAGNOSIS — Z79899 Other long term (current) drug therapy: Secondary | ICD-10-CM | POA: Diagnosis not present

## 2020-10-02 DIAGNOSIS — Z681 Body mass index (BMI) 19 or less, adult: Secondary | ICD-10-CM | POA: Diagnosis not present

## 2020-10-02 DIAGNOSIS — E860 Dehydration: Secondary | ICD-10-CM | POA: Diagnosis present

## 2020-10-02 DIAGNOSIS — I959 Hypotension, unspecified: Secondary | ICD-10-CM | POA: Diagnosis not present

## 2020-10-02 DIAGNOSIS — A419 Sepsis, unspecified organism: Secondary | ICD-10-CM

## 2020-10-02 DIAGNOSIS — R310 Gross hematuria: Secondary | ICD-10-CM | POA: Diagnosis present

## 2020-10-02 DIAGNOSIS — N390 Urinary tract infection, site not specified: Secondary | ICD-10-CM | POA: Diagnosis present

## 2020-10-02 DIAGNOSIS — Z1623 Resistance to quinolones and fluoroquinolones: Secondary | ICD-10-CM | POA: Diagnosis present

## 2020-10-02 DIAGNOSIS — E871 Hypo-osmolality and hyponatremia: Secondary | ICD-10-CM | POA: Diagnosis not present

## 2020-10-02 DIAGNOSIS — Z933 Colostomy status: Secondary | ICD-10-CM | POA: Diagnosis not present

## 2020-10-02 DIAGNOSIS — N202 Calculus of kidney with calculus of ureter: Secondary | ICD-10-CM | POA: Diagnosis present

## 2020-10-02 DIAGNOSIS — R338 Other retention of urine: Secondary | ICD-10-CM | POA: Diagnosis present

## 2020-10-02 DIAGNOSIS — R109 Unspecified abdominal pain: Secondary | ICD-10-CM

## 2020-10-02 DIAGNOSIS — R1030 Lower abdominal pain, unspecified: Secondary | ICD-10-CM | POA: Diagnosis not present

## 2020-10-02 DIAGNOSIS — Z20822 Contact with and (suspected) exposure to covid-19: Secondary | ICD-10-CM | POA: Diagnosis present

## 2020-10-02 DIAGNOSIS — N401 Enlarged prostate with lower urinary tract symptoms: Secondary | ICD-10-CM | POA: Diagnosis present

## 2020-10-02 DIAGNOSIS — D638 Anemia in other chronic diseases classified elsewhere: Secondary | ICD-10-CM | POA: Diagnosis present

## 2020-10-02 LAB — RESP PANEL BY RT-PCR (FLU A&B, COVID) ARPGX2
Influenza A by PCR: NEGATIVE
Influenza B by PCR: NEGATIVE
SARS Coronavirus 2 by RT PCR: NEGATIVE

## 2020-10-02 LAB — CBC WITH DIFFERENTIAL/PLATELET
Abs Immature Granulocytes: 0.09 10*3/uL — ABNORMAL HIGH (ref 0.00–0.07)
Basophils Absolute: 0.1 10*3/uL (ref 0.0–0.1)
Basophils Relative: 0 %
Eosinophils Absolute: 1.3 10*3/uL — ABNORMAL HIGH (ref 0.0–0.5)
Eosinophils Relative: 7 %
HCT: 38.3 % — ABNORMAL LOW (ref 39.0–52.0)
Hemoglobin: 12.1 g/dL — ABNORMAL LOW (ref 13.0–17.0)
Immature Granulocytes: 1 %
Lymphocytes Relative: 5 %
Lymphs Abs: 1 10*3/uL (ref 0.7–4.0)
MCH: 28.3 pg (ref 26.0–34.0)
MCHC: 31.6 g/dL (ref 30.0–36.0)
MCV: 89.5 fL (ref 80.0–100.0)
Monocytes Absolute: 1 10*3/uL (ref 0.1–1.0)
Monocytes Relative: 5 %
Neutro Abs: 16.3 10*3/uL — ABNORMAL HIGH (ref 1.7–7.7)
Neutrophils Relative %: 82 %
Platelets: 430 10*3/uL — ABNORMAL HIGH (ref 150–400)
RBC: 4.28 MIL/uL (ref 4.22–5.81)
RDW: 13.9 % (ref 11.5–15.5)
WBC: 19.7 10*3/uL — ABNORMAL HIGH (ref 4.0–10.5)
nRBC: 0 % (ref 0.0–0.2)

## 2020-10-02 LAB — COMPREHENSIVE METABOLIC PANEL
ALT: 13 U/L (ref 0–44)
ALT: 11 U/L (ref 0–44)
AST: 11 U/L — ABNORMAL LOW (ref 15–41)
AST: 9 U/L — ABNORMAL LOW (ref 15–41)
Albumin: 3 g/dL — ABNORMAL LOW (ref 3.5–5.0)
Albumin: 2.4 g/dL — ABNORMAL LOW (ref 3.5–5.0)
Alkaline Phosphatase: 66 U/L (ref 38–126)
Alkaline Phosphatase: 49 U/L (ref 38–126)
Anion gap: 9 (ref 5–15)
Anion gap: 5 (ref 5–15)
BUN: 40 mg/dL — ABNORMAL HIGH (ref 8–23)
BUN: 34 mg/dL — ABNORMAL HIGH (ref 8–23)
CO2: 22 mmol/L (ref 22–32)
CO2: 22 mmol/L (ref 22–32)
Calcium: 13.5 mg/dL (ref 8.9–10.3)
Calcium: 11.9 mg/dL — ABNORMAL HIGH (ref 8.9–10.3)
Chloride: 104 mmol/L (ref 98–111)
Chloride: 107 mmol/L (ref 98–111)
Creatinine, Ser: 1.27 mg/dL — ABNORMAL HIGH (ref 0.61–1.24)
Creatinine, Ser: 0.88 mg/dL (ref 0.61–1.24)
GFR, Estimated: >60 mL/min (ref >60)
GFR, Estimated: >60 mL/min (ref >60)
Glucose, Bld: 89 mg/dL (ref 70–99)
Glucose, Bld: 87 mg/dL (ref 70–99)
Potassium: 5 mmol/L (ref 3.5–5.1)
Potassium: 3.7 mmol/L (ref 3.5–5.1)
Sodium: 135 mmol/L (ref 135–145)
Sodium: 134 mmol/L — ABNORMAL LOW (ref 135–145)
Total Bilirubin: 0.4 mg/dL (ref 0.3–1.2)
Total Bilirubin: 0.4 mg/dL (ref 0.3–1.2)
Total Protein: 7.5 g/dL (ref 6.5–8.1)
Total Protein: 6.1 g/dL — ABNORMAL LOW (ref 6.5–8.1)

## 2020-10-02 LAB — URINALYSIS, ROUTINE W REFLEX MICROSCOPIC
Bilirubin Urine: NEGATIVE
Glucose, UA: NEGATIVE mg/dL
Ketones, ur: 15 mg/dL — AB
Nitrite: NEGATIVE
Protein, ur: 100 mg/dL — AB
Specific Gravity, Urine: 1.025 (ref 1.005–1.030)
pH: 7.5 (ref 5.0–8.0)

## 2020-10-02 LAB — TSH: TSH: 2.355 u[IU]/mL (ref 0.350–4.500)

## 2020-10-02 LAB — VITAMIN B12: Vitamin B-12: 142 pg/mL — ABNORMAL LOW (ref 180–914)

## 2020-10-02 LAB — LIPASE, BLOOD: Lipase: 35 U/L (ref 11–51)

## 2020-10-02 LAB — LACTIC ACID, PLASMA
Lactic Acid, Venous: 1.2 mmol/L (ref 0.5–1.9)
Lactic Acid, Venous: 1.9 mmol/L (ref 0.5–1.9)

## 2020-10-02 LAB — URINALYSIS, MICROSCOPIC (REFLEX): WBC, UA: >50 WBC/hpf (ref 0–5)

## 2020-10-02 LAB — PHOSPHORUS: Phosphorus: 2.1 mg/dL — ABNORMAL LOW (ref 2.5–4.6)

## 2020-10-02 LAB — GLUCOSE, CAPILLARY: Glucose-Capillary: 104 mg/dL — ABNORMAL HIGH (ref 70–99)

## 2020-10-02 MED ORDER — BISACODYL 5 MG PO TBEC: 5.0000 mg | DELAYED_RELEASE_TABLET | Freq: Every day | ORAL | Status: DC | PRN

## 2020-10-02 MED ORDER — SODIUM CHLORIDE 0.9 % IV SOLN
1.0000 g | INTRAVENOUS | Status: DC
  Administered 2020-10-02 – 2020-10-05 (×4): 1 g via INTRAVENOUS
  Filled 2020-10-02 (×3): qty 1
  Filled 2020-10-02: qty 10
  Filled 2020-10-02: qty 1

## 2020-10-02 MED ORDER — TAMSULOSIN HCL 0.4 MG PO CAPS
0.4000 mg | ORAL_CAPSULE | Freq: Every day | ORAL | Status: DC
  Administered 2020-10-02 – 2020-10-13 (×11): 0.4 mg via ORAL
  Filled 2020-10-02 (×11): qty 1

## 2020-10-02 MED ORDER — K PHOS MONO-SOD PHOS DI & MONO 155-852-130 MG PO TABS
500.0000 mg | ORAL_TABLET | Freq: Once | ORAL | Status: AC
  Administered 2020-10-02: 500 mg via ORAL
  Filled 2020-10-02: qty 2

## 2020-10-02 MED ORDER — MORPHINE SULFATE (PF) 4 MG/ML IV SOLN
4.0000 mg | Freq: Once | INTRAVENOUS | Status: AC
  Administered 2020-10-02: 4 mg via INTRAVENOUS
  Filled 2020-10-02: qty 1

## 2020-10-02 MED ORDER — SODIUM CHLORIDE 0.9 % IV SOLN: INTRAVENOUS | Status: DC

## 2020-10-02 MED ORDER — SODIUM CHLORIDE 0.9 % IV SOLN: INTRAVENOUS | Status: AC

## 2020-10-02 MED ORDER — LACTATED RINGERS IV BOLUS
1000.0000 mL | Freq: Once | INTRAVENOUS | Status: AC
  Administered 2020-10-02: 1000 mL via INTRAVENOUS

## 2020-10-02 MED ORDER — ADULT MULTIVITAMIN W/MINERALS CH
1.0000 | ORAL_TABLET | Freq: Every day | ORAL | Status: DC
  Administered 2020-10-02 – 2020-10-13 (×11): 1 via ORAL
  Filled 2020-10-02 (×11): qty 1

## 2020-10-02 MED ORDER — ONDANSETRON HCL 4 MG PO TABS: 4.0000 mg | ORAL_TABLET | Freq: Four times a day (QID) | ORAL | Status: DC | PRN

## 2020-10-02 MED ORDER — B COMPLEX-C PO TABS
1.0000 | ORAL_TABLET | Freq: Every day | ORAL | Status: DC
  Administered 2020-10-02 – 2020-10-13 (×11): 1 via ORAL
  Filled 2020-10-02 (×12): qty 1

## 2020-10-02 MED ORDER — OXYCODONE HCL 5 MG PO TABS
2.5000 mg | ORAL_TABLET | ORAL | Status: DC | PRN
  Administered 2020-10-02 – 2020-10-03 (×7): 5 mg via ORAL
  Administered 2020-10-04: 2.5 mg via ORAL
  Filled 2020-10-02 (×8): qty 1

## 2020-10-02 MED ORDER — ENOXAPARIN SODIUM 40 MG/0.4ML ~~LOC~~ SOLN
40.0000 mg | Freq: Every day | SUBCUTANEOUS | Status: AC
  Administered 2020-10-02 – 2020-10-05 (×4): 40 mg via SUBCUTANEOUS
  Filled 2020-10-02 (×4): qty 0.4

## 2020-10-02 MED ORDER — CYANOCOBALAMIN 1000 MCG/ML IJ SOLN
1000.0000 ug | Freq: Once | INTRAMUSCULAR | Status: AC
  Administered 2020-10-02: 1000 ug via INTRAMUSCULAR
  Filled 2020-10-02: qty 1

## 2020-10-02 MED ORDER — ONDANSETRON HCL 4 MG/2ML IJ SOLN: 4.0000 mg | Freq: Four times a day (QID) | INTRAMUSCULAR | Status: DC | PRN

## 2020-10-02 MED ORDER — SODIUM CHLORIDE 0.9 % IV SOLN
2.0000 g | Freq: Once | INTRAVENOUS | Status: AC
  Administered 2020-10-02: 2 g via INTRAVENOUS
  Filled 2020-10-02: qty 20

## 2020-10-02 MED ORDER — CHLORHEXIDINE GLUCONATE CLOTH 2 % EX PADS
6.0000 | MEDICATED_PAD | Freq: Every day | CUTANEOUS | Status: DC
  Administered 2020-10-02 – 2020-10-13 (×12): 6 via TOPICAL

## 2020-10-02 MED ORDER — VITAMIN B-12 1000 MCG PO TABS
1000.0000 ug | ORAL_TABLET | Freq: Every day | ORAL | Status: DC
  Administered 2020-10-03 – 2020-10-04 (×2): 1000 ug via ORAL
  Filled 2020-10-02 (×3): qty 1

## 2020-10-02 MED ORDER — ACETAMINOPHEN 500 MG PO TABS
1000.0000 mg | ORAL_TABLET | Freq: Three times a day (TID) | ORAL | Status: DC
  Administered 2020-10-02 – 2020-10-13 (×29): 1000 mg via ORAL
  Filled 2020-10-02 (×29): qty 2

## 2020-10-02 MED ORDER — VITAMIN D 25 MCG (1000 UNIT) PO TABS
1000.0000 [IU] | ORAL_TABLET | Freq: Every day | ORAL | Status: DC
  Administered 2020-10-02 – 2020-10-04 (×3): 1000 [IU] via ORAL
  Filled 2020-10-02 (×3): qty 1

## 2020-10-02 MED ORDER — ACETAMINOPHEN 325 MG PO TABS
650.0000 mg | ORAL_TABLET | Freq: Four times a day (QID) | ORAL | Status: DC | PRN
  Administered 2020-10-05 – 2020-10-13 (×6): 650 mg via ORAL
  Filled 2020-10-02 (×5): qty 2

## 2020-10-02 MED ORDER — ACETAMINOPHEN 650 MG RE SUPP: 650.0000 mg | Freq: Four times a day (QID) | RECTAL | Status: DC | PRN

## 2020-10-02 MED ORDER — POLYETHYLENE GLYCOL 3350 17 G PO PACK
17.0000 g | PACK | Freq: Two times a day (BID) | ORAL | Status: DC
  Administered 2020-10-02 – 2020-10-12 (×8): 17 g via ORAL
  Filled 2020-10-02 (×20): qty 1

## 2020-10-02 MED ORDER — ZOLEDRONIC ACID 4 MG/5ML IV CONC
4.0000 mg | Freq: Once | INTRAVENOUS | Status: AC
  Administered 2020-10-02: 4 mg via INTRAVENOUS
  Filled 2020-10-02: qty 5

## 2020-10-02 NOTE — Consult Note (Signed)
Urology Consult   Physician requesting consult:  Dr. Sueanne Margarita  Reason for consult: Bladder mass  History of Present Illness: Jamie Wilson is a 64 y.o. with history of colostomy, bowel obstruction, urinary retention performing CIC who presents with right flank pain, suprapubic pain, hematuria, dysuria.  CT scan in ED with what appears to be large bladder mass with calcifications in addition to bilateral nephrolithiasis, possible 10mm stone in right ureter, no hydronephrosis. Leukocytosis to 19.7, Cr elevated to 1.27 from baseline 0.95, UA concerning for infection.   Has a history of BPH, urinary retention, has been performing CIC for 10 years, was told CIC was necessary due to prolonged indwelling catheter. He had cystoscopy 2010-2012 but nothing since that time. He is unable to void spontaneously. He reports intermittent gross hematuria and purulent discharge as well as air in his urine when catheterizing for the last several months. Has had several CT images in the last few years, read mentioning thickening of bladder wall in each. No history of bladder surgery. History of nephrolithiasis, passed stones bilaterally.   Longstanding history of smoking, about 1ppd currently.   Past Medical History:  Diagnosis Date  . Colostomy care ([place])   . Toe infection     Past Surgical History:  Procedure Laterality Date  . ABDOMINAL SURGERY      Current Hospital Medications:  Home Meds:  No current facility-administered medications on file prior to encounter.   Current Outpatient Medications on File Prior to Encounter  Medication Sig Dispense Refill  . acetaminophen (TYLENOL) 325 MG tablet Take 2 tablets (650 mg total) by mouth every 6 (six) hours as needed for mild pain. (Patient not taking: Reported on 10/02/2020)    . polyethylene glycol (MIRALAX / GLYCOLAX) 17 g packet Take 17 g by mouth 2 (two) times daily. (Patient not taking: Reported on 10/02/2020) 14 each 0  . tamsulosin (FLOMAX) 0.4 MG  CAPS capsule Take 1 capsule (0.4 mg total) by mouth daily. (Patient not taking: Reported on 10/02/2020) 30 capsule 2     Scheduled Meds: . acetaminophen  1,000 mg Oral Q8H  . B-complex with vitamin C  1 tablet Oral Daily  . cholecalciferol  1,000 Units Oral Daily  . cyanocobalamin  1,000 mcg Intramuscular Once  . multivitamin with minerals  1 tablet Oral Daily  . [START ON 10/03/2020] vitamin B-12  1,000 mcg Oral Daily   Continuous Infusions: . sodium chloride 125 mL/hr at 10/02/20 0442  . [START ON 10/03/2020] cefTRIAXone (ROCEPHIN)  IV     PRN Meds:.oxyCODONE  Allergies: No Known Allergies  Family History  Problem Relation Age of Onset  . CAD Neg Hx   . Heart failure Neg Hx     Social History:  reports that he has been smoking. He has never used smokeless tobacco. He reports current alcohol use. He reports current drug use. Drug: Marijuana.  ROS: A complete review of systems was performed.  All systems are negative except for pertinent findings as noted.  Physical Exam:  Vital signs in last 24 hours: Temp:  [97.7 F (36.5 C)] 97.7 F (36.5 C) (12/04 2209) Pulse Rate:  [84-115] 100 (12/05 0830) Resp:  [14-19] 15 (12/05 0830) BP: (102-158)/(64-148) 112/65 (12/05 0830) SpO2:  [96 %-100 %] 96 % (12/05 0830) Weight:  [56.7 kg-57 kg] 57 kg (12/04 2212) Constitutional:  Alert and oriented, No acute distress Cardiovascular: Regular rate and rhythm, No JVD Respiratory: Normal respiratory effort, Lungs clear bilaterally GI: Abdomen is soft, nontender, nondistended, no  abdominal masses GU: No CVA tenderness Lymphatic: No lymphadenopathy Neurologic: Grossly intact, no focal deficits Psychiatric: Normal mood and affect  Laboratory Data:  Recent Labs    10/02/20 0119  WBC 19.7*  HGB 12.1*  HCT 38.3*  PLT 430*    Recent Labs    10/02/20 0119  NA 135  K 5.0  CL 104  GLUCOSE 89  BUN 40*  CALCIUM 13.5*  CREATININE 1.27*     Results for orders placed or performed  during the hospital encounter of 10/02/20 (from the past 24 hour(s))  Comprehensive metabolic panel     Status: Abnormal   Collection Time: 10/02/20  1:19 AM  Result Value Ref Range   Sodium 135 135 - 145 mmol/L   Potassium 5.0 3.5 - 5.1 mmol/L   Chloride 104 98 - 111 mmol/L   CO2 22 22 - 32 mmol/L   Glucose, Bld 89 70 - 99 mg/dL   BUN 40 (H) 8 - 23 mg/dL   Creatinine, Ser 1.27 (H) 0.61 - 1.24 mg/dL   Calcium 13.5 (HH) 8.9 - 10.3 mg/dL   Total Protein 7.5 6.5 - 8.1 g/dL   Albumin 3.0 (L) 3.5 - 5.0 g/dL   AST 11 (L) 15 - 41 U/L   ALT 13 0 - 44 U/L   Alkaline Phosphatase 66 38 - 126 U/L   Total Bilirubin 0.4 0.3 - 1.2 mg/dL   GFR, Estimated >60 >60 mL/min   Anion gap 9 5 - 15  Lipase, blood     Status: None   Collection Time: 10/02/20  1:19 AM  Result Value Ref Range   Lipase 35 11 - 51 U/L  CBC with Differential     Status: Abnormal   Collection Time: 10/02/20  1:19 AM  Result Value Ref Range   WBC 19.7 (H) 4.0 - 10.5 K/uL   RBC 4.28 4.22 - 5.81 MIL/uL   Hemoglobin 12.1 (L) 13.0 - 17.0 g/dL   HCT 38.3 (L) 39 - 52 %   MCV 89.5 80.0 - 100.0 fL   MCH 28.3 26.0 - 34.0 pg   MCHC 31.6 30.0 - 36.0 g/dL   RDW 13.9 11.5 - 15.5 %   Platelets 430 (H) 150 - 400 K/uL   nRBC 0.0 0.0 - 0.2 %   Neutrophils Relative % 82 %   Neutro Abs 16.3 (H) 1.7 - 7.7 K/uL   Lymphocytes Relative 5 %   Lymphs Abs 1.0 0.7 - 4.0 K/uL   Monocytes Relative 5 %   Monocytes Absolute 1.0 0.1 - 1.0 K/uL   Eosinophils Relative 7 %   Eosinophils Absolute 1.3 (H) 0.0 - 0.5 K/uL   Basophils Relative 0 %   Basophils Absolute 0.1 0.0 - 0.1 K/uL   Immature Granulocytes 1 %   Abs Immature Granulocytes 0.09 (H) 0.00 - 0.07 K/uL  Urinalysis, Routine w reflex microscopic     Status: Abnormal   Collection Time: 10/02/20  1:19 AM  Result Value Ref Range   Color, Urine YELLOW YELLOW   APPearance TURBID (A) CLEAR   Specific Gravity, Urine 1.025 1.005 - 1.030   pH 7.5 5.0 - 8.0   Glucose, UA NEGATIVE NEGATIVE mg/dL    Hgb urine dipstick MODERATE (A) NEGATIVE   Bilirubin Urine NEGATIVE NEGATIVE   Ketones, ur 15 (A) NEGATIVE mg/dL   Protein, ur 100 (A) NEGATIVE mg/dL   Nitrite NEGATIVE NEGATIVE   Leukocytes,Ua MODERATE (A) NEGATIVE  Urinalysis, Microscopic (reflex)     Status: Abnormal  Collection Time: 10/02/20  1:19 AM  Result Value Ref Range   RBC / HPF 0-5 0 - 5 RBC/hpf   WBC, UA >50 0 - 5 WBC/hpf   Bacteria, UA MANY (A) NONE SEEN   Squamous Epithelial / LPF 0-5 0 - 5  Lactic acid, plasma     Status: None   Collection Time: 10/02/20  2:27 AM  Result Value Ref Range   Lactic Acid, Venous 1.2 0.5 - 1.9 mmol/L  Resp Panel by RT-PCR (Flu A&B, Covid) Nasopharyngeal Swab     Status: None   Collection Time: 10/02/20  3:03 AM   Specimen: Nasopharyngeal Swab; Nasopharyngeal(NP) swabs in vial transport medium  Result Value Ref Range   SARS Coronavirus 2 by RT PCR NEGATIVE NEGATIVE   Influenza A by PCR NEGATIVE NEGATIVE   Influenza B by PCR NEGATIVE NEGATIVE  Lactic acid, plasma     Status: None   Collection Time: 10/02/20  4:27 AM  Result Value Ref Range   Lactic Acid, Venous 1.9 0.5 - 1.9 mmol/L   Recent Results (from the past 240 hour(s))  Resp Panel by RT-PCR (Flu A&B, Covid) Nasopharyngeal Swab     Status: None   Collection Time: 10/02/20  3:03 AM   Specimen: Nasopharyngeal Swab; Nasopharyngeal(NP) swabs in vial transport medium  Result Value Ref Range Status   SARS Coronavirus 2 by RT PCR NEGATIVE NEGATIVE Final    Comment: (NOTE) SARS-CoV-2 target nucleic acids are NOT DETECTED.  The SARS-CoV-2 RNA is generally detectable in upper respiratory specimens during the acute phase of infection. The lowest concentration of SARS-CoV-2 viral copies this assay can detect is 138 copies/mL. A negative result does not preclude SARS-Cov-2 infection and should not be used as the sole basis for treatment or other patient management decisions. A negative result may occur with  improper specimen  collection/handling, submission of specimen other than nasopharyngeal swab, presence of viral mutation(s) within the areas targeted by this assay, and inadequate number of viral copies(<138 copies/mL). A negative result must be combined with clinical observations, patient history, and epidemiological information. The expected result is Negative.  Fact Sheet for Patients:  EntrepreneurPulse.com.au  Fact Sheet for Healthcare Providers:  IncredibleEmployment.be  This test is no t yet approved or cleared by the [place] FDA and  has been authorized for detection and/or diagnosis of SARS-CoV-2 by FDA under an Emergency Use Authorization (EUA). This EUA will remain  in effect (meaning this test can be used) for the duration of the COVID-19 declaration under Section 564(b)(1) of the Act, 21 U.S.C.section 360bbb-3(b)(1), unless the authorization is terminated  or revoked sooner.       Influenza A by PCR NEGATIVE NEGATIVE Final   Influenza B by PCR NEGATIVE NEGATIVE Final    Comment: (NOTE) The Xpert Xpress SARS-CoV-2/FLU/RSV plus assay is intended as an aid in the diagnosis of influenza from Nasopharyngeal swab specimens and should not be used as a sole basis for treatment. Nasal washings and aspirates are unacceptable for Xpert Xpress SARS-CoV-2/FLU/RSV testing.  Fact Sheet for Patients: EntrepreneurPulse.com.au  Fact Sheet for Healthcare Providers: IncredibleEmployment.be  This test is not yet approved or cleared by the [place] FDA and has been authorized for detection and/or diagnosis of SARS-CoV-2 by FDA under an Emergency Use Authorization (EUA). This EUA will remain in effect (meaning this test can be used) for the duration of the COVID-19 declaration under Section 564(b)(1) of the Act, 21 U.S.C. section 360bbb-3(b)(1), unless the authorization is terminated or revoked.  Performed at [facility], [place] [address], [place], [place] [address]     Renal Function: Recent Labs    10/02/20 0119  CREATININE 1.27*   Estimated Creatinine Clearance: 46.8 mL/min (A) (by C-G formula based on SCr of 1.27 mg/dL (H)).  Radiologic Imaging: CT Renal Stone Study  Result Date: 10/02/2020 CLINICAL DATA:  Bilateral flank pain EXAM: CT ABDOMEN AND PELVIS WITHOUT CONTRAST TECHNIQUE: Multidetector CT imaging of the abdomen and pelvis was performed following the standard protocol without IV contrast. COMPARISON:  None. FINDINGS: Lower chest: The visualized heart size within normal limits. No pericardial fluid/thickening. No hiatal hernia. The visualized portions of the lungs are clear. Hepatobiliary: Multiple low-density lesions are seen throughout the liver parenchyma the largest in the right hepatic dome measures 5.5 cm. No evidence of calcified gallstones or biliary ductal dilatation. Pancreas:  Unremarkable.  No surrounding inflammatory changes. Spleen: Normal in size. Although limited due to the lack of intravenous contrast, normal in appearance. Adrenals/Urinary Tract: Both adrenal glands appear normal there is mild right-sided pelviectasis with stranding changes. There are multiple coarse calcifications throughout the right kidney the largest measuring 8 mm within the upper pole. Within the proximal right ureter there is a 3 mm calculus present. There is calcifications seen in the lower pole the left kidney. Again noted within the bladder is heterogeneous soft tissue masslike area and wall thickening extending superiorly which appears to have a fistulous connection to the overlying small bowel and colonic loops. There now appears to be small foci of air and debris. Stomach/Bowel: The stomach and small bowel are unremarkable. There is scattered colonic diverticula with a moderate amount of colonic stool present. A left lower quadrant ostomy is present. Vascular/Lymphatic: There are no  enlarged abdominal or pelvic lymph nodes. Scattered aortic atherosclerotic calcifications are seen without aneurysmal dilatation. Reproductive: The patient is status post hysterectomy. No adnexal masses or collections seen. Other: No evidence of abdominal wall mass or hernia. Musculoskeletal: No acute or significant osseous findings. 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 Mild right pelviectasis with a proximal 3 mm ureteral calculi. Multiple bilateral non-obstructing renal calculi. Aortic Atherosclerosis (ICD10-I70.0). Electronically Signed   By: Prudencio Pair M.D.   On: 10/02/2020 03:46    I independently reviewed the above imaging studies.  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 UA concerning for infection, however is in the setting of chronic CIC.  - no acute urologic intervention recommended at this time - will trial medical passage of stone with flomax, hydration, pain control as needed, urine straining - please contact urology if clinically worsens, patient becomes hemodynamically unstable or there is concern for worsening infection, as this may indicate need for urgent urinary decompression. - please obtain KUB on 12/6 to evaluate stone passage - agree with antibiotics for possible UTI, narrow as appropriate with results of UCx - recommend continued drainage with indwelling foley, flush as needed if not draining appropriately - urology will work on scheduling patient for cystoscopy, possible bladder biopsy, possible TURBT next available   Jamaiya Tunnell  [facility] 10/02/2020, 9:42 AM

## 2020-10-02 NOTE — ED Provider Notes (Signed)
[facility] DEPT Provider Note   CSN: [id number] Arrival date & time: 10/01/20  2200   History Chief Complaint  Patient presents with  . Flank Pain  . Hematuria    Jamie Wilson is a 64 y.o. male.  The history is provided by the patient.  Flank Pain  Hematuria  He has history of colostomy, chronic need for self-catheterization and comes in because of lower abdominal pain and bilateral flank pain.  He states he has been having pain for several months but it got worse tonight.  Pain is rated at 10/10.  Nothing makes it better, nothing makes it worse.  He denies fever, chills, sweats.  He denies nausea, vomiting, diarrhea.  He states that he has not eaten for the last 2 days because of pain.  He had been hospitalized a month ago and was referred to urology for follow-up but did not go because of pain and inability to get a ride.  He states he does not have any pain medicine at home.  Past Medical History:  Diagnosis Date  . Colostomy care ([place])   . Toe infection     Patient Active Problem List   Diagnosis Date Noted  . Acute urinary retention 08/28/2020  . Small bowel obstruction ([place]) 08/16/2020  . Hypercalcemia 08/16/2020  . AKI (acute kidney injury) ([place]) 08/16/2020  . Right nephrolithiasis 08/16/2020    Past Surgical History:  Procedure Laterality Date  . ABDOMINAL SURGERY         Family History  Problem Relation Age of Onset  . CAD Neg Hx   . Heart failure Neg Hx     Social History   Tobacco Use  . Smoking status: Current Some Day Smoker  . Smokeless tobacco: Never Used  Substance Use Topics  . Alcohol use: Yes  . Drug use: Yes    Types: Marijuana    Home Medications Prior to Admission medications   Medication Sig Start Date End Date Taking? Authorizing Provider  acetaminophen (TYLENOL) 325 MG tablet Take 2 tablets (650 mg total) by mouth every 6 (six) hours as needed for mild pain. 08/28/20   Barb Merino, MD   polyethylene glycol (MIRALAX / GLYCOLAX) 17 g packet Take 17 g by mouth 2 (two) times daily. 08/28/20   Barb Merino, MD  tamsulosin (FLOMAX) 0.4 MG CAPS capsule Take 1 capsule (0.4 mg total) by mouth daily. 08/28/20   Barb Merino, MD    Allergies    Patient has no known allergies.  Review of Systems   Review of Systems  Genitourinary: Positive for flank pain and hematuria.  All other systems reviewed and are negative.   Physical Exam Updated Vital Signs BP (!) 158/148 (BP Location: Right Arm)   Pulse 100   Temp 97.7 F (36.5 C) (Oral)   Resp 15   Ht 6\' 1"  (1.854 m)   Wt 57 kg   SpO2 100%   BMI 16.58 kg/m   Physical Exam Vitals and nursing note reviewed.   Somewhat cachectic 64 year old male, resting comfortably and in no acute distress. Vital signs are significant for elevated blood pressure. Oxygen saturation is 100%, which is normal. Head is normocephalic and atraumatic. PERRLA, EOMI. Oropharynx is clear. Neck is nontender and supple without adenopathy or JVD. Back is nontender in the midline.  There is moderate bilateral CVA tenderness. Lungs are clear without rales, wheezes, or rhonchi. Chest is nontender. Heart has regular rate and rhythm without murmur. Abdomen is soft,  flat.colostomy is present in left lower quadrant.  There is tenderness across the lower abdomen without any rebound or guarding.  There are no masses or hepatosplenomegaly and peristalsis is hypoactive. Extremities have no cyanosis or edema, full range of motion is present. Skin is warm and dry without rash. Neurologic: Mental status is normal, cranial nerves are intact, there are no motor or sensory deficits.  ED Results / Procedures / Treatments   Labs (all labs ordered are listed, but only abnormal results are displayed) Labs Reviewed  COMPREHENSIVE METABOLIC PANEL - Abnormal; Notable for the following components:      Result Value   BUN 40 (*)    Creatinine, Ser 1.27 (*)    Calcium  13.5 (*)    Albumin 3.0 (*)    AST 11 (*)    All other components within normal limits  CBC WITH DIFFERENTIAL/PLATELET - Abnormal; Notable for the following components:   WBC 19.7 (*)    Hemoglobin 12.1 (*)    HCT 38.3 (*)    Platelets 430 (*)    Neutro Abs 16.3 (*)    Eosinophils Absolute 1.3 (*)    Abs Immature Granulocytes 0.09 (*)    All other components within normal limits  URINALYSIS, ROUTINE W REFLEX MICROSCOPIC - Abnormal; Notable for the following components:   APPearance TURBID (*)    Hgb urine dipstick MODERATE (*)    Ketones, ur 15 (*)    Protein, ur 100 (*)    Leukocytes,Ua MODERATE (*)    All other components within normal limits  URINALYSIS, MICROSCOPIC (REFLEX) - Abnormal; Notable for the following components:   Bacteria, UA MANY (*)    All other components within normal limits  URINE CULTURE  RESP PANEL BY RT-PCR (FLU A&B, COVID) ARPGX2  LIPASE, BLOOD  LACTIC ACID, PLASMA  LACTIC ACID, PLASMA  PTH, INTACT AND CALCIUM  PTH-RELATED PEPTIDE   Radiology CT Renal Stone Study  Result Date: 10/02/2020 CLINICAL DATA:  Bilateral flank pain EXAM: CT ABDOMEN AND PELVIS WITHOUT CONTRAST TECHNIQUE: Multidetector CT imaging of the abdomen and pelvis was performed following the standard protocol without IV contrast. COMPARISON:  None. FINDINGS: Lower chest: The visualized heart size within normal limits. No pericardial fluid/thickening. No hiatal hernia. The visualized portions of the lungs are clear. Hepatobiliary: Multiple low-density lesions are seen throughout the liver parenchyma the largest in the right hepatic dome measures 5.5 cm. No evidence of calcified gallstones or biliary ductal dilatation. Pancreas:  Unremarkable.  No surrounding inflammatory changes. Spleen: Normal in size. Although limited due to the lack of intravenous contrast, normal in appearance. Adrenals/Urinary Tract: Both adrenal glands appear normal there is mild right-sided pelviectasis with stranding  changes. There are multiple coarse calcifications throughout the right kidney the largest measuring 8 mm within the upper pole. Within the proximal right ureter there is a 3 mm calculus present. There is calcifications seen in the lower pole the left kidney. Again noted within the bladder is heterogeneous soft tissue masslike area and wall thickening extending superiorly which appears to have a fistulous connection to the overlying small bowel and colonic loops. There now appears to be small foci of air and debris. Stomach/Bowel: The stomach and small bowel are unremarkable. There is scattered colonic diverticula with a moderate amount of colonic stool present. A left lower quadrant ostomy is present. Vascular/Lymphatic: There are no enlarged abdominal or pelvic lymph nodes. Scattered aortic atherosclerotic calcifications are seen without aneurysmal dilatation. Reproductive: The patient is status post hysterectomy. No  adnexal masses or collections seen. Other: No evidence of abdominal wall mass or hernia. Musculoskeletal: No acute or significant osseous findings. 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 Mild right pelviectasis with a proximal 3 mm ureteral calculi. Multiple bilateral non-obstructing renal calculi. Aortic Atherosclerosis (ICD10-I70.0). Electronically Signed   By: Prudencio Pair M.D.   On: 10/02/2020 03:46    Procedures Procedures  CRITICAL CARE Performed by: Delora Fuel Total critical care time: 40 minutes Critical care time was exclusive of separately billable procedures and treating other patients. Critical care was necessary to treat or prevent imminent or life-threatening deterioration. Critical care was time spent personally by me on the following activities: development of treatment plan with patient and/or surrogate  as well as nursing, discussions with consultants, evaluation of patient's response to treatment, examination of patient, obtaining history from patient or surrogate, ordering and performing treatments and interventions, ordering and review of laboratory studies, ordering and review of radiographic studies, pulse oximetry and re-evaluation of patient's condition.  Medications Ordered in ED Medications  0.9 %  sodium chloride infusion ( Intravenous New Bag/Given 10/02/20 0442)  cefTRIAXone (ROCEPHIN) 1 g in sodium chloride 0.9 % 100 mL IVPB (has no administration in time range)  lactated ringers bolus 1,000 mL (0 mLs Intravenous Stopped 10/02/20 0432)  morphine 4 MG/ML injection 4 mg (4 mg Intravenous Given 10/02/20 0149)  lactated ringers bolus 1,000 mL (0 mLs Intravenous Stopped 10/02/20 0432)  cefTRIAXone (ROCEPHIN) 2 g in sodium chloride 0.9 % 100 mL IVPB (0 g Intravenous Stopped 10/02/20 0339)  zolendronic acid (ZOMETA) 4 mg in sodium chloride 0.9 % 100 mL IVPB (0 mg Intravenous Stopped 10/02/20 0525)    ED Course  I have reviewed the triage vital signs and the nursing notes.  Pertinent labs & imaging results that were available during my care of the patient were reviewed by me and considered in my medical decision making (see chart for details).  MDM Rules/Calculators/A&P Abdominal pain and flank pain of uncertain cause.  Consider urolithiasis, urinary tract infection with pyelonephritis, diverticulitis, colitis.  Old records are reviewed, and he had been admitted 5 weeks ago for small bowel obstruction.  Currently, no symptoms suggestive of bowel obstruction.  Will check screening labs, urinalysis, renal stone protocol CT scan.  He will be given IV fluids and morphine.  Urinalysis is consistent with infection and he is started on antibiotics.  Metabolic panel shows significant hypercalcemia which is new, also elevated BUN and slight rise in creatinine suggestive of dehydration.  He will be given  additional IV fluids.  CT scan is still pending.  Case is discussed with Dr. Alcario Drought of [facility], who agrees to admit the patient.  CT scan is concerning for malignancy.  Work-up will proceed as an inpatient.  Final Clinical Impression(s) / ED Diagnoses Final diagnoses:  Urinary tract infection without hematuria, site unspecified  Hypercalcemia  Acute kidney injury (nontraumatic) ([place])  Dehydration    Rx / DC Orders ED Discharge Orders    None       Delora Fuel, MD 85/88/50 [phone]

## 2020-10-02 NOTE — ED Notes (Signed)
Date and time results received: 10/02/20 0221  Test: Calcium Critical Value: 13.5  Name of Provider Notified: Dr. Roxanne Mins

## 2020-10-02 NOTE — H&P (Signed)
History and Physical    Jamie Wilson KNL:[id number] DOB: 13-Nov-1955 DOA: 10/02/2020  PCP: Patient, [name]  Patient coming from: home Chief Complaint: flank pain, lower abdominal pain  HPI: Jamie Wilson is a 64 y.o. male with a pertinent history of history of bowel obstruction s/p partial bowel resection and colostomy, suspected but not sure if confirmed chronic recurrent urinary obstruction and need for self-catheterization, hypercalcemia and kidney stones and comes in because of lower abdominal pain and bilateral flank pain.    He states he has been having pain for several months but it got worse tonight.  Pain is rated at 10/10.  Nothing makes it better, nothing makes it worse.  He denies fever, chills, sweats.  He denies nausea, vomiting, diarrhea.  He states that he has not eaten for the last 2 days because of pain.  He had been hospitalized a month ago and was referred to urology for follow-up but did not go because of pain and inability to get a ride.  He states he does not have any opioid pain medicine at home but has been taking quite a few ibuprofen.    In October as noted the patient was hypercalcemic and it seems like a work-up was done without revealing an etiology.  In the emergency department patient was initially tachycardic but this then resolved to a heart rate of 96, WBC 19.7, Hgb 12.1, PLT 430, NA 135, K5.0, CO2 22, SCR 1.27, calcium 13.5, lactic acid 1.9, glucose 69, UA showed leukocytes, protein and ketones, microscopy showed greater than 50 white blood cells. Started on abx  Review of Systems: As per HPI otherwise 10 point review of systems negative.  Other pertinents as below:  General - has had weight loss, HEENT - denies any new HA's or visual changes Cardio - denies any new CP or palpitations Resp - denies any new cough or SOB GI - has constipation GU - has chronic urinary retention MSK - has flank and some bone pain Skin - denies any new skin concerns Neuro -  denies any new numbness or weakness Psych - has been feeling fatigued and down   Past Medical History:  Diagnosis Date  . Colostomy care ([place])   . Toe infection     Past Surgical History:  Procedure Laterality Date  . ABDOMINAL SURGERY       reports that he has been smoking. He has never used smokeless tobacco. He reports current alcohol use. He reports current drug use. Drug: Marijuana.  No Known Allergies  Family History  Problem Relation Age of Onset  . CAD Neg Hx   . Heart failure Neg Hx     Prior to Admission medications   Medication Sig Start Date End Date Taking? Authorizing Provider  acetaminophen (TYLENOL) 325 MG tablet Take 2 tablets (650 mg total) by mouth every 6 (six) hours as needed for mild pain. Patient not taking: Reported on 10/02/2020 08/28/20   Barb Merino, MD  polyethylene glycol (MIRALAX / GLYCOLAX) 17 g packet Take 17 g by mouth 2 (two) times daily. Patient not taking: Reported on 10/02/2020 08/28/20   Barb Merino, MD  tamsulosin (FLOMAX) 0.4 MG CAPS capsule Take 1 capsule (0.4 mg total) by mouth daily. Patient not taking: Reported on 10/02/2020 08/28/20   Barb Merino, MD    Physical Exam: Vitals:   10/02/20 0430 10/02/20 0530 10/02/20 0700 10/02/20 0811  BP: 108/68 112/69 109/64 114/67  Pulse: 84 88 92 96  Resp: 15 14 15  16  Temp:      TempSrc:      SpO2: 98% 99% 96% 96%  Weight:      Height:        Constitutional: NAD, comfortable, temporal wasting, weak appearing, sunken eyes, cachectic Eyes: pupils equal and reactive to light, anicteric, without injection ENMT: MMM, throat without exudates or erythema Neck: normal, supple, no masses, no thyromegaly noted Respiratory: CTAB, nwob,   Cardiovascular: rrr w/o mrg, warm extremities Abdomen: NBS, NT,   Musculoskeletal: moving all 4 extremities, strength grossly intact 5/5 in the UE and LE's, DTR's diminished some Skin: no rashes, lesions, ulcers. No induration Neurologic: CN 2-12  grossly intact. Sensation intact Psychiatric: AO appearing, mentation appropriate  Labs on Admission: I have personally reviewed following labs and imaging studies  CBC: Recent Labs  Lab 10/02/20 0119  WBC 19.7*  NEUTROABS 16.3*  HGB 12.1*  HCT 38.3*  MCV 89.5  PLT 008*   Basic Metabolic Panel: Recent Labs  Lab 10/02/20 0119  NA 135  K 5.0  CL 104  CO2 22  GLUCOSE 89  BUN 40*  CREATININE 1.27*  CALCIUM 13.5*   GFR: Estimated Creatinine Clearance: 46.8 mL/min (A) (by C-G formula based on SCr of 1.27 mg/dL (H)). Liver Function Tests: Recent Labs  Lab 10/02/20 0119  AST 11*  ALT 13  ALKPHOS 66  BILITOT 0.4  PROT 7.5  ALBUMIN 3.0*   Recent Labs  Lab 10/02/20 0119  LIPASE 35   No results for input(s): AMMONIA in the last 168 hours. Coagulation Profile: No results for input(s): INR, PROTIME in the last 168 hours. Cardiac Enzymes: No results for input(s): CKTOTAL, CKMB, CKMBINDEX, TROPONINI in the last 168 hours. BNP (last 3 results) No results for input(s): PROBNP in the last 8760 hours. HbA1C: No results for input(s): HGBA1C in the last 72 hours. CBG: No results for input(s): GLUCAP in the last 168 hours. Lipid Profile: No results for input(s): CHOL, HDL, LDLCALC, TRIG, CHOLHDL, LDLDIRECT in the last 72 hours. Thyroid Function Tests: No results for input(s): TSH, T4TOTAL, FREET4, T3FREE, THYROIDAB in the last 72 hours. Anemia Panel: No results for input(s): VITAMINB12, FOLATE, FERRITIN, TIBC, IRON, RETICCTPCT in the last 72 hours. Urine analysis:    Component Value Date/Time   COLORURINE YELLOW 10/02/2020 0119   APPEARANCEUR TURBID (A) 10/02/2020 0119   LABSPEC 1.025 10/02/2020 0119   PHURINE 7.5 10/02/2020 0119   GLUCOSEU NEGATIVE 10/02/2020 0119   HGBUR MODERATE (A) 10/02/2020 0119   BILIRUBINUR NEGATIVE 10/02/2020 0119   KETONESUR 15 (A) 10/02/2020 0119   PROTEINUR 100 (A) 10/02/2020 0119   NITRITE NEGATIVE 10/02/2020 0119   LEUKOCYTESUR  MODERATE (A) 10/02/2020 0119    Radiological Exams on Admission: CT Renal Stone Study  Result Date: 10/02/2020 CLINICAL DATA:  Bilateral flank pain EXAM: CT ABDOMEN AND PELVIS WITHOUT CONTRAST TECHNIQUE: Multidetector CT imaging of the abdomen and pelvis was performed following the standard protocol without IV contrast. COMPARISON:  None. FINDINGS: Lower chest: The visualized heart size within normal limits. No pericardial fluid/thickening. No hiatal hernia. The visualized portions of the lungs are clear. Hepatobiliary: Multiple low-density lesions are seen throughout the liver parenchyma the largest in the right hepatic dome measures 5.5 cm. No evidence of calcified gallstones or biliary ductal dilatation. Pancreas:  Unremarkable.  No surrounding inflammatory changes. Spleen: Normal in size. Although limited due to the lack of intravenous contrast, normal in appearance. Adrenals/Urinary Tract: Both adrenal glands appear normal there is mild right-sided pelviectasis with stranding  changes. There are multiple coarse calcifications throughout the right kidney the largest measuring 8 mm within the upper pole. Within the proximal right ureter there is a 3 mm calculus present. There is calcifications seen in the lower pole the left kidney. Again noted within the bladder is heterogeneous soft tissue masslike area and wall thickening extending superiorly which appears to have a fistulous connection to the overlying small bowel and colonic loops. There now appears to be small foci of air and debris. Stomach/Bowel: The stomach and small bowel are unremarkable. There is scattered colonic diverticula with a moderate amount of colonic stool present. A left lower quadrant ostomy is present. Vascular/Lymphatic: There are no enlarged abdominal or pelvic lymph nodes. Scattered aortic atherosclerotic calcifications are seen without aneurysmal dilatation. Reproductive: The patient is status post hysterectomy. No adnexal masses  or collections seen. Other: No evidence of abdominal wall mass or hernia. Musculoskeletal: No acute or significant osseous findings. 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 Mild right pelviectasis with a proximal 3 mm ureteral calculi. Multiple bilateral non-obstructing renal calculi. Aortic Atherosclerosis (ICD10-I70.0). Electronically Signed   By: Prudencio Pair M.D.   On: 10/02/2020 03:46    EKG: ordered to look at QTc  Assessment/Plan Principal Problem:   Acute lower UTI Active Problems:   Hypercalcemia   Acute urinary retention   Mass of bladder   Malnutrition (HCC)   CT scan is concerning for malignancy with ?metastasis to liver-oncology follow-up in the outpatient setting, please ensure appt is setup before leaving. Possible fistula creation of bladder Kidney stones --urology consult  Pain control  Complicated by kidney stones, right ureter 51mm stone Complicated UTI, associated with self-catheterization, WBC 19.7 -Continue ceftriaxone, de-escalate based on cultures to something oral --can allow patient to self catheterize when he demonstrates appropriate technique  Kidney stones - probably will improve with hypercalcemia goin down.  Stay hydrated.    Hypercalcemia suspect from malignancy but will do work-up Intact PTH was low in October -Intact PTH, vitamin D levels, phosphorus --urine calcium  --Normal saline 165mL/hr for 20 hours, considering bolusing --start vitamin d empirically --s/p zometa dose, don't think needs to be redosed --hydration, will probably hold off on calcitonin -Awaiting PTH related peptide --follow up ekg  Chronic urinary retention that needs self-catheterization, Flomax, wonder if will improve with calcium improving.  History of hypophosphatemia, 1.7 in October, follow-up this  study  Creatinine elevation possibly from polyuria given hypercalcemia, seems like baseline is 0.8-1-will likely improve with hydration, continue to monitor  Mild anemia 12.1-continue to monitor, ferritin was 156 and percent saturation was 10, possible component of iron deficiency but suspect more so a OCD Elevated platelets, probably reactive-continue to monitor  Colostomy care Current some day smoker-encourage cessation Constipation possibly from hypercalcemia-continue MiraLAX and supportive care  Malnutrition with BMI <18, nutrition consult, lots of  Vitamins B12 deficiency-118 in October, B12 supplementation, giving IM dose and then daily oral --bcomplex vitamin, multivitamin, hopefully and probably these will make him feel better.  Patient and/or Family completely agreed with the plan, expressed understanding and I answered all questions.  DVT prophylaxis: Lovenox SQ Code Status: Full code Family Communication: n/a Disposition Plan: needs oncology on discharge, please ensure appt set up; needs urology follow up "3 mm calculus present. There is calcifications seen in the lower pole the left kidney. Again noted within the bladder is  heterogeneous soft tissue masslike area and wall thickening extending superiorly which appears to have a fistulous connection to the overlying small bowel and colonic loops. There now appears to be small foci of air and debris." Consults called: Nutritoin consulted Admission status: inpatient because of severe hypercalcemia and complicated UTI requiring atleast 2 days   A total of 72 minutes utilized during this admission.  [facility]   If 7PM-7AM, please contact night-coverage [email or web address] Password [facility]  10/02/2020, 8:54 AM

## 2020-10-02 NOTE — Plan of Care (Signed)

## 2020-10-02 NOTE — ED Notes (Signed)
Attempted to call report x 1  

## 2020-10-03 ENCOUNTER — Inpatient Hospital Stay (HOSPITAL_COMMUNITY): Payer: Medicaid Other

## 2020-10-03 LAB — COMPREHENSIVE METABOLIC PANEL
ALT: 11 U/L (ref 0–44)
AST: 12 U/L — ABNORMAL LOW (ref 15–41)
Albumin: 2.1 g/dL — ABNORMAL LOW (ref 3.5–5.0)
Alkaline Phosphatase: 52 U/L (ref 38–126)
Anion gap: 6 (ref 5–15)
BUN: 26 mg/dL — ABNORMAL HIGH (ref 8–23)
CO2: 21 mmol/L — ABNORMAL LOW (ref 22–32)
Calcium: 11 mg/dL — ABNORMAL HIGH (ref 8.9–10.3)
Chloride: 108 mmol/L (ref 98–111)
Creatinine, Ser: 1.05 mg/dL (ref 0.61–1.24)
GFR, Estimated: >60 mL/min (ref >60)
Glucose, Bld: 118 mg/dL — ABNORMAL HIGH (ref 70–99)
Potassium: 4 mmol/L (ref 3.5–5.1)
Sodium: 135 mmol/L (ref 135–145)
Total Bilirubin: 0.3 mg/dL (ref 0.3–1.2)
Total Protein: 5.3 g/dL — ABNORMAL LOW (ref 6.5–8.1)

## 2020-10-03 LAB — CBC
HCT: 28.7 % — ABNORMAL LOW (ref 39.0–52.0)
Hemoglobin: 9 g/dL — ABNORMAL LOW (ref 13.0–17.0)
MCH: 28.4 pg (ref 26.0–34.0)
MCHC: 31.4 g/dL (ref 30.0–36.0)
MCV: 90.5 fL (ref 80.0–100.0)
Platelets: 298 10*3/uL (ref 150–400)
RBC: 3.17 MIL/uL — ABNORMAL LOW (ref 4.22–5.81)
RDW: 13.7 % (ref 11.5–15.5)
WBC: 14.2 10*3/uL — ABNORMAL HIGH (ref 4.0–10.5)
nRBC: 0 % (ref 0.0–0.2)

## 2020-10-03 LAB — URINE CULTURE

## 2020-10-03 LAB — GLUCOSE, CAPILLARY: Glucose-Capillary: 90 mg/dL (ref 70–99)

## 2020-10-03 LAB — PTH, INTACT AND CALCIUM
Calcium, Total (PTH): 12.8 mg/dL — ABNORMAL HIGH (ref 8.6–10.2)
PTH: 8 pg/mL — ABNORMAL LOW (ref 15–65)

## 2020-10-03 LAB — PARATHYROID HORMONE, INTACT (NO CA): PTH: 6 pg/mL — ABNORMAL LOW (ref 15–65)

## 2020-10-03 MED ORDER — CYANOCOBALAMIN 1000 MCG/ML IJ SOLN
1000.0000 ug | Freq: Once | INTRAMUSCULAR | Status: AC
  Administered 2020-10-03: 1000 ug via INTRAMUSCULAR
  Filled 2020-10-03: qty 1

## 2020-10-03 MED ORDER — VITAMIN B-12 1000 MCG PO TABS
1000.0000 ug | ORAL_TABLET | Freq: Every day | ORAL | Status: DC
  Administered 2020-10-04 – 2020-10-13 (×9): 1000 ug via ORAL
  Filled 2020-10-03 (×8): qty 1

## 2020-10-03 MED ORDER — MORPHINE SULFATE (PF) 2 MG/ML IV SOLN
2.0000 mg | INTRAVENOUS | Status: DC | PRN
  Administered 2020-10-03 – 2020-10-12 (×39): 2 mg via INTRAVENOUS
  Filled 2020-10-03 (×39): qty 1

## 2020-10-03 MED ORDER — SODIUM CHLORIDE 0.9 % IV SOLN: INTRAVENOUS | Status: AC

## 2020-10-03 MED ORDER — ENSURE ENLIVE PO LIQD
237.0000 mL | Freq: Two times a day (BID) | ORAL | Status: DC
  Administered 2020-10-04 – 2020-10-10 (×10): 237 mL via ORAL

## 2020-10-03 MED ORDER — PROSOURCE PLUS PO LIQD
30.0000 mL | Freq: Two times a day (BID) | ORAL | Status: DC
  Administered 2020-10-03 – 2020-10-13 (×20): 30 mL via ORAL
  Filled 2020-10-03 (×18): qty 30

## 2020-10-03 NOTE — Consult Note (Signed)
WOC consulted for ostomy care; reviewed notes from previous admissions.  Patient has had stoma since 2018.  Supplies ordered/updated. OK for bedside nurses to assist with pouch changes if needed. It is noted in previous WOC notes that the patient in independent in his care of his stoma.    Re consult if needed, will not follow at this time. Thanks  Kmarion Rawl [employer], RN,CWOCN, CNS, [place] [phone])

## 2020-10-03 NOTE — Progress Notes (Addendum)
PROGRESS NOTE    Jamie Wilson  VPX:[id number] DOB: 10/14/1956 DOA: 10/02/2020 PCP: Patient, [name]   Chief Complaint  Patient presents with  . Flank Pain  . 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  He has been having pain and got worse 12/5. As per report-Pain is rated at 10/10.  Nothing makes it better, nothing makes it worse.  He denies fever, chills, sweats.  He denies nausea, vomiting, diarrhea.  He states that he has not eaten for the last 2 days because of pain.  He had been hospitalized a month ago and was referred to urology for follow-up but did not go because of pain and inability to get a ride.  He states he does not have any opioid pain medicine at home but has been taking quite a few ibuprofen.    In October as noted the patient was hypercalcemic and it seems like a work-up was done without revealing an etiology.  In the ED, initially tachycardic but this then resolved to a heart rate of 96, WBC 19.7, Hgb 12.1, PLT 430, NA 135, K5.0, CO2 22, SCR 1.27, calcium 13.5, lactic acid 1.9, glucose 69, UA showed leukocytes, protein and ketones, microscopy showed greater than 50 white blood cells. Started on abx   Subjective: Complains of Foley catheter may be blocked and having pain.  Reports oral medication not helping.  Want something IV. Patient afebrile blood pressure has been soft 80s to 100 overnight. Leukocytosis improving 14.2K from 19.7K, calcium down at 11 point from 13.5.   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  Urology is working on scheduling patient for cystoscopy, possible bladder biopsy, possible TURBT as outpatient. KUB 12/6 shows numerous calcifications project over the right renal shadow, not proximal right ureteral calculus is not definitely seen air-filled loops of large and small bowel question ileus. Flush Foley catheter, follow-up urology recommendation.  Hematuria/purulent discharge with bilateral nephrolithiasis, possible UTI, continue plan as per #1. Continue on pain control.  Culture pending-not sent yet.  Hypercalcemia unclear etiology question for malignancy.  Intact PTH was low in October follow-up intact PTH vitamin D phosphorus level, status post Zometa, aggressive IV fluids hydration and calcium is improving already.  Continue IV hydration  Chronic urine retention doing self cath.  Continue Flomax.  Hypotension likely multifactorial monitor closely as needed IV fluid boluses.  Continue empiric antibiotics.  B12 deficiency starting replacement with IM x1 then on high-dose daily supplementation  Mild metabolic acidosis bicarb 31.  Monitor.  Hypophosphatemia replaced.  Repeat level.  Underweight BMI 16- augment diet.  Nutrition: Diet Order            Diet regular Room service appropriate? Yes; Fluid consistency: Thin  Diet effective now                 Body mass index is 16.46 kg/m.  DVT prophylaxis: enoxaparin (LOVENOX) injection 40 mg Start: 10/02/20 1030 Place TED hose Start: 10/02/20 1000 Code Status:   Code Status: Full Code Family Communication: plan of care discussed with patient at bedside.  Status is: Inpatient Remains inpatient appropriate because:IV treatments appropriate due to intensity of illness or inability to take PO and Inpatient level of care appropriate due to severity of illness  Dispo: The patient is from: Home              Anticipated d/c is to: Home              Anticipated d/c date is:1- 2  days once cleared by neurology              Patient currently is not medically stable to d/c.   Consultants:see note  Procedures:see note  Culture/Microbiology Other culture-see note  Medications: Scheduled Meds: . (feeding supplement) PROSource Plus  30 mL Oral BID BM  . acetaminophen  1,000 mg Oral Q8H  . B-complex with vitamin C  1 tablet Oral Daily  . Chlorhexidine Gluconate Cloth  6 each Topical Daily  . cholecalciferol  1,000 Units Oral Daily  . cyanocobalamin  1,000 mcg Intramuscular Once  . enoxaparin (LOVENOX) injection  40 mg Subcutaneous Daily  . [START ON 10/04/2020] feeding supplement  237 mL Oral BID BM  . multivitamin with minerals  1 tablet Oral Daily  . polyethylene glycol  17 g Oral BID  . tamsulosin  0.4 mg Oral Daily  . vitamin B-12  1,000 mcg Oral Daily  . [START ON 10/04/2020] vitamin B-12  1,000 mcg Oral Daily   Continuous Infusions: . sodium chloride 100 mL/hr at 10/03/20 1050  . cefTRIAXone (ROCEPHIN)  IV 1 g (10/02/20 2219)    Antimicrobials: Anti-infectives (From admission, onward)   Start     Dose/Rate Route Frequency Ordered Stop   10/02/20 2200  cefTRIAXone (ROCEPHIN) 1 g in sodium chloride 0.9 % 100 mL IVPB        1 g 200 mL/hr over 30 Minutes Intravenous Every 24 hours 10/02/20 0437     10/02/20 0230  cefTRIAXone (ROCEPHIN) 2 g in sodium chloride 0.9 % 100 mL IVPB        2 g 200 mL/hr over 30 Minutes Intravenous  Once 10/02/20 0226 10/02/20 0339     Objective: Vitals: Today's Vitals   10/03/20 0917 10/03/20 1051 10/03/20 1311 10/03/20 1424  BP:   108/62   Pulse:   91   Resp:   18   Temp:   98.7 F (37.1 C)   TempSrc:      SpO2:   95%   Weight:      Height:      PainSc: 10-Worst pain ever 10-Worst pain ever  10-Worst pain ever    Intake/Output Summary (Last 24 hours) at 10/03/2020 1533 Last data filed at 10/03/2020 1318 Gross per 24 hour  Intake 1800 ml  Output 2200 ml  Net -400 ml   Filed Weights   10/01/20 2209 10/01/20  2212 10/03/20 0500  Weight: 56.7 kg 57 kg 56.6 kg   Weight change: -0.1 kg  Intake/Output from previous day: 12/05 0701 - 12/06 0700 In: 1800 [P.O.:600; I.V.:1100; IV Piggyback:100] Out: 2250 [Urine:2250] Intake/Output this shift: Total I/O In: -  Out: 650 [Urine:650]  Examination: General exam: AAOx3 ,NAD, weak appearing. HEENT:Oral mucosa moist, Ear/Nose WNL grossly,dentition normal. Respiratory system: bilaterally xlear,no wheezing or crackles,no use of accessory muscle, non tender. Cardiovascular system: S1 & S2 +, regular, No JVD. Gastrointestinal system: Abdomen soft, NT,ND, BS+.  Colostomy present Nervous System:Alert, awake, moving extremities and grossly nonfocal Extremities: No edema, distal peripheral pulses palpable.  Skin: No rashes,no icterus. MSK:  Normal muscle bulk,tone, power  Data Reviewed: I have personally reviewed following labs and imaging studies CBC: Recent Labs  Lab 10/02/20 0119 10/03/20 0515  WBC 19.7* 14.2*  NEUTROABS 16.3*  --   HGB 12.1* 9.0*  HCT 38.3* 28.7*  MCV 89.5 90.5  PLT 430* 099   Basic Metabolic Panel: Recent Labs  Lab 10/02/20 0119 10/02/20 0247 10/02/20 0825 10/03/20 0515  NA 135  --  134* 135  K 5.0  --  3.7 4.0  CL 104  --  107 108  CO2 22  --  22 21*  GLUCOSE 89  --  87 118*  BUN 40*  --  34* 26*  CREATININE 1.27*  --  0.88 1.05  CALCIUM 13.5* 12.8* 11.9* 11.0*  PHOS  --   --  2.1*  --    GFR: Estimated Creatinine Clearance: 56.2 mL/min (by C-G formula based on SCr of 1.05 mg/dL). Liver Function Tests: Recent Labs  Lab 10/02/20 0119 10/02/20 0825 10/03/20 0515  AST 11* 9* 12*  ALT 13 11 11   ALKPHOS 66 49 52  BILITOT 0.4 0.4 0.3  PROT 7.5 6.1* 5.3*  ALBUMIN 3.0* 2.4* 2.1*   Recent Labs  Lab 10/02/20 0119  LIPASE 35   No results for input(s): AMMONIA in the last 168 hours. Coagulation Profile: No results for input(s): INR, PROTIME in the last 168 hours. Cardiac Enzymes: No results for input(s):  CKTOTAL, CKMB, CKMBINDEX, TROPONINI in the last 168 hours. BNP (last 3 results) No results for input(s): PROBNP in the last 8760 hours. HbA1C: No results for input(s): HGBA1C in the last 72 hours. CBG: Recent Labs  Lab 10/02/20 2135 10/03/20 0734  GLUCAP 104* 90   Lipid Profile: No results for input(s): CHOL, HDL, LDLCALC, TRIG, CHOLHDL, LDLDIRECT in the last 72 hours. Thyroid Function Tests: Recent Labs    10/02/20 1053  TSH 2.355   Anemia Panel: Recent Labs    10/02/20 1053  VITAMINB12 142*   Sepsis Labs: Recent Labs  Lab 10/02/20 0227 10/02/20 0427  LATICACIDVEN 1.2 1.9    Recent Results (from the past 240 hour(s))  Urine culture     Status: Abnormal   Collection Time: 10/02/20  2:27 AM   Specimen: Urine, Clean Catch  Result Value Ref Range Status   Specimen Description   Final    URINE, CLEAN CATCH Performed at [facility], [place] [address], [place], [place] [address]    Special Requests   Final    NONE Performed at [facility], [place] [address] [place], [place] [address]    Culture MULTIPLE SPECIES PRESENT, SUGGEST RECOLLECTION (A)  Final   Report Status 10/03/2020 FINAL  Final  Resp Panel by RT-PCR (Flu A&B, Covid) Nasopharyngeal Swab     Status: None   Collection Time: 10/02/20  3:03 AM   Specimen: Nasopharyngeal Swab; Nasopharyngeal(NP) swabs in vial transport medium  Result Value Ref Range Status   SARS Coronavirus 2 by RT PCR NEGATIVE NEGATIVE Final    Comment: (NOTE) SARS-CoV-2 target nucleic acids are NOT DETECTED.  The SARS-CoV-2 RNA is generally detectable in upper respiratory specimens during the acute phase of infection. The lowest concentration of SARS-CoV-2 viral copies this assay can detect is 138 copies/mL. A negative result does not preclude SARS-Cov-2 infection and should not be used as the sole basis for treatment or other patient management decisions. A negative result may occur with  improper  specimen collection/handling, submission of specimen other than nasopharyngeal  swab, presence of viral mutation(s) within the areas targeted by this assay, and inadequate number of viral copies(<138 copies/mL). A negative result must be combined with clinical observations, patient history, and epidemiological information. The expected result is Negative.  Fact Sheet for Patients:  EntrepreneurPulse.com.au  Fact Sheet for Healthcare Providers:  IncredibleEmployment.be  This test is no t yet approved or cleared by the [place] FDA and  has been authorized for detection and/or diagnosis of SARS-CoV-2 by FDA under an Emergency Use Authorization (EUA). This EUA will remain  in effect (meaning this test can be used) for the duration of the COVID-19 declaration under Section 564(b)(1) of the Act, 21 U.S.C.section 360bbb-3(b)(1), unless the authorization is terminated  or revoked sooner.       Influenza A by PCR NEGATIVE NEGATIVE Final   Influenza B by PCR NEGATIVE NEGATIVE Final    Comment: (NOTE) The Xpert Xpress SARS-CoV-2/FLU/RSV plus assay is intended as an aid in the diagnosis of influenza from Nasopharyngeal swab specimens and should not be used as a sole basis for treatment. Nasal washings and aspirates are unacceptable for Xpert Xpress SARS-CoV-2/FLU/RSV testing.  Fact Sheet for Patients: EntrepreneurPulse.com.au  Fact Sheet for Healthcare Providers: IncredibleEmployment.be  This test is not yet approved or cleared by the [place] FDA and has been authorized for detection and/or diagnosis of SARS-CoV-2 by FDA under an Emergency Use Authorization (EUA). This EUA will remain in effect (meaning this test can be used) for the duration of the COVID-19 declaration under Section 564(b)(1) of the Act, 21 U.S.C. section 360bbb-3(b)(1), unless the authorization is terminated or revoked.  Performed at  [facility], [place] [address], [place], [place] [address]      Radiology Studies: DG Abd 1 View  Result Date: 10/03/2020 CLINICAL DATA:  Right ureteral stone EXAM: ABDOMEN - 1 VIEW COMPARISON:  10/02/2020 FINDINGS: Gaseous distension large and small bowel loops within the abdomen. Moderate volume stool within the right hemicolon. Numerous calcifications project over the right renal shadow. Known proximal right ureteral calculus is not definitively seen. IMPRESSION: 1. Numerous calcifications project over the right renal shadow. 2. Known proximal right ureteral calculus is not definitively seen. 3. Air-filled loops of large and small bowel suggesting ileus. Electronically Signed   By: Davina Poke D.O.   On: 10/03/2020 08:32   CT Renal Stone Study  Result Date: 10/02/2020 CLINICAL DATA:  Bilateral flank pain EXAM: CT ABDOMEN AND PELVIS WITHOUT CONTRAST TECHNIQUE: Multidetector CT imaging of the abdomen and pelvis was performed following the standard protocol without IV contrast. COMPARISON:  None. FINDINGS: Lower chest: The visualized heart size within normal limits. No pericardial fluid/thickening. No hiatal hernia. The visualized portions of the lungs are clear. Hepatobiliary: Multiple low-density lesions are seen throughout the liver parenchyma the largest in the right hepatic dome measures 5.5 cm. No evidence of calcified gallstones or biliary ductal dilatation. Pancreas:  Unremarkable.  No surrounding inflammatory changes. Spleen: Normal in size. Although limited due to the lack of intravenous contrast, normal in appearance. Adrenals/Urinary Tract: Both adrenal glands appear normal there is mild right-sided pelviectasis with stranding changes. There are multiple coarse calcifications throughout the right kidney the largest measuring 8 mm within the upper pole. Within the proximal right ureter there is a 3 mm calculus present. There is calcifications seen in the lower pole the  left kidney. Again noted within the bladder is heterogeneous soft tissue masslike area and wall thickening extending superiorly which appears to have a fistulous connection to  the overlying small bowel and colonic loops. There now appears to be small foci of air and debris. Stomach/Bowel: The stomach and small bowel are unremarkable. There is scattered colonic diverticula with a moderate amount of colonic stool present. A left lower quadrant ostomy is present. Vascular/Lymphatic: There are no enlarged abdominal or pelvic lymph nodes. Scattered aortic atherosclerotic calcifications are seen without aneurysmal dilatation. Reproductive: The patient is status post hysterectomy. No adnexal masses or collections seen. Other: No evidence of abdominal wall mass or hernia. Musculoskeletal: No acute or significant osseous findings. 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 Mild right pelviectasis with a proximal 3 mm ureteral calculi. Multiple bilateral non-obstructing renal calculi. Aortic Atherosclerosis (ICD10-I70.0). Electronically Signed   By: Prudencio Pair M.D.   On: 10/02/2020 03:46     LOS: 1 day   Antonieta Pert, MD [facility]  10/03/2020, 3:33 PM

## 2020-10-03 NOTE — Progress Notes (Signed)
Initial Nutrition Assessment  DOCUMENTATION CODES:   Underweight  INTERVENTION:  - will order Ensure Enlive BID, each supplement provides 350 kcal and 20 grams of protein. - will order Magic Cup BID with meals, each supplement provides 290 kcal and 9 grams of protein. - will order 30 ml Prosource Plus BID, each supplement provides 100 kcal and 15 grams protein.  - complete NFPE at follow-up.    NUTRITION DIAGNOSIS:   Increased nutrient needs related to acute illness, chronic illness as evidenced by estimated needs.  GOAL:   Patient will meet greater than or equal to 90% of their needs  MONITOR:   PO intake, Supplement acceptance, Labs, Weight trends  REASON FOR ASSESSMENT:   Consult Assessment of nutrition requirement/status (B12 deficiency in the past. Hypercalcemia and low phosphorus in the past which will likely improve with Zometa. Likely to start a B-complex, MVI with minerals, and vitamin D. 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 He presented to the ED due to not feeling well for several months and pain which has been worsening and is now 10/10; nothing makes it better or worse and he has not eaten for 2 days d/t pain.  Patient noted to be out of the room to Diagnostic Radiology. No intakes have been documented since admission.   He has not been seen by a [place] RD at any time in the past.  Weight today is 125 lb and weight on 10/31 was 134 lb. This indicates 9 lb weight loss (6.7% body weight) in the past 5 weeks; significant for time frame.   Suspect that hypophosphatemia is 2/2 hypercalcemia, especially in light of possible malignancy. Recommend repletion of Phos as needed.   Per notes: - R flank pain - CT showed possible bladder mass and colovesical  fistula--no plan for intervention currently; possible TURBT as outpatient - hematuria with bilateral nephrolithiasis and possible UTI - hypercalcemia--questionable malignancy - vitamin B12 deficiency--supplementation started - hypophosphatemia--resolved s/p repletion    Labs reviewed; CBG: 90 mg/dl, BUN: 26 mg/dl, Ca: 11 mg/dl. Medications reviewed; 1 tablet multivitamin with minerals/day, 1 tablet B-complex + vitamin C/day, 1000 units cholecalciferol/day, 1000 mcg IM cyanocobalamin x1 dose 12/6, 17 g miralax BID, 1000 mcg oral cyanocobalamin/day starting 12/6.  IVF; NS @ 100 ml/hr.      NUTRITION - FOCUSED PHYSICAL EXAM:  unable to complete at this time.   Diet Order:   Diet Order            Diet regular Room service appropriate? Yes; Fluid consistency: Thin  Diet effective now                 EDUCATION NEEDS:   Not appropriate for education at this time  Skin:  Skin Assessment: Reviewed RN Assessment  Last BM:  12/3 (patient reported)  Height:   Ht Readings from Last 1 Encounters:  10/01/20 6\' 1"  (1.854 m)    Weight:   Wt Readings from Last 1 Encounters:  10/03/20 56.6 kg     Estimated Nutritional Needs:  Kcal:  9798-9211 kcal Protein:  100-115 grams Fluid:  >/= 2 L/day      Jarome Matin, MS, RD, LDN, CNSC Inpatient Clinical Dietitian RD pager # available in [facility]  After hours/weekend pager # available in [facility]

## 2020-10-03 NOTE — Plan of Care (Signed)

## 2020-10-04 LAB — COMPREHENSIVE METABOLIC PANEL
ALT: 15 U/L (ref 0–44)
AST: 15 U/L (ref 15–41)
Albumin: 2.3 g/dL — ABNORMAL LOW (ref 3.5–5.0)
Alkaline Phosphatase: 55 U/L (ref 38–126)
Anion gap: 7 (ref 5–15)
BUN: 17 mg/dL (ref 8–23)
CO2: 25 mmol/L (ref 22–32)
Calcium: 10.9 mg/dL — ABNORMAL HIGH (ref 8.9–10.3)
Chloride: 105 mmol/L (ref 98–111)
Creatinine, Ser: 0.83 mg/dL (ref 0.61–1.24)
GFR, Estimated: >60 mL/min (ref >60)
Glucose, Bld: 108 mg/dL — ABNORMAL HIGH (ref 70–99)
Potassium: 4.4 mmol/L (ref 3.5–5.1)
Sodium: 137 mmol/L (ref 135–145)
Total Bilirubin: 0.3 mg/dL (ref 0.3–1.2)
Total Protein: 5.9 g/dL — ABNORMAL LOW (ref 6.5–8.1)

## 2020-10-04 LAB — VITAMIN D 25 HYDROXY (VIT D DEFICIENCY, FRACTURES): Vit D, 25-Hydroxy: 30.34 ng/mL (ref 30–100)

## 2020-10-04 LAB — GLUCOSE, CAPILLARY: Glucose-Capillary: 106 mg/dL — ABNORMAL HIGH (ref 70–99)

## 2020-10-04 MED ORDER — K PHOS MONO-SOD PHOS DI & MONO 155-852-130 MG PO TABS
250.0000 mg | ORAL_TABLET | Freq: Three times a day (TID) | ORAL | Status: DC
  Administered 2020-10-04 – 2020-10-05 (×3): 250 mg via ORAL
  Filled 2020-10-04 (×4): qty 1

## 2020-10-04 NOTE — Progress Notes (Signed)
Subjective:  [name] continues to have lower abdominal pain.  He has been given laxatives and is having more stool output.  He has no right flank pain.  His UOP is good but the urine is a little turbid.   He remains on rocephin.  His culture grew Mx species.    ROS:  Review of Systems  All other systems reviewed and are negative.   Anti-infectives: Anti-infectives (From admission, onward)   Start     Dose/Rate Route Frequency Ordered Stop   10/02/20 2200  cefTRIAXone (ROCEPHIN) 1 g in sodium chloride 0.9 % 100 mL IVPB        1 g 200 mL/hr over 30 Minutes Intravenous Every 24 hours 10/02/20 0437     10/02/20 0230  cefTRIAXone (ROCEPHIN) 2 g in sodium chloride 0.9 % 100 mL IVPB        2 g 200 mL/hr over 30 Minutes Intravenous  Once 10/02/20 0226 10/02/20 0339      Current Facility-Administered Medications  Medication Dose Route Frequency Provider Last Rate Last Admin  . (feeding supplement) PROSource Plus liquid 30 mL  30 mL Oral BID BM Kc, Ramesh, MD   30 mL at 10/03/20 2014  . acetaminophen (TYLENOL) tablet 650 mg  650 mg Oral Q6H PRN Sueanne Margarita, DO       Or  . acetaminophen (TYLENOL) suppository 650 mg  650 mg Rectal Q6H PRN Sueanne Margarita, DO      . acetaminophen (TYLENOL) tablet 1,000 mg  1,000 mg Oral Q8H [place]le, [place], DO   1,000 mg at 10/04/20 7867  . B-complex with vitamin C tablet 1 tablet  1 tablet Oral Daily Sueanne Margarita, DO   1 tablet at 10/04/20 0936  . bisacodyl (DULCOLAX) EC tablet 5 mg  5 mg Oral Daily PRN Sueanne Margarita, DO      . cefTRIAXone (ROCEPHIN) 1 g in sodium chloride 0.9 % 100 mL IVPB  1 g Intravenous Q24H Jennette Kettle M, DO 200 mL/hr at 10/03/20 2145 1 g at 10/03/20 2145  . Chlorhexidine Gluconate Cloth 2 % PADS 6 each  6 each Topical Daily Sueanne Margarita, DO   6 each at 10/04/20 0936  . enoxaparin (LOVENOX) injection 40 mg  40 mg Subcutaneous Daily Sueanne Margarita, DO   40 mg at 10/04/20 0936  . feeding supplement (ENSURE ENLIVE / ENSURE PLUS)  liquid 237 mL  237 mL Oral BID BM Kc, Ramesh, MD   237 mL at 10/04/20 0937  . morphine 2 MG/ML injection 2 mg  2 mg Intravenous Q4H PRN Antonieta Pert, MD   2 mg at 10/04/20 0936  . multivitamin with minerals tablet 1 tablet  1 tablet Oral Daily Sueanne Margarita, DO   1 tablet at 10/04/20 0936  . ondansetron (ZOFRAN) tablet 4 mg  4 mg Oral Q6H PRN Sueanne Margarita, DO       Or  . ondansetron [facility]) injection 4 mg  4 mg Intravenous Q6H PRN Sueanne Margarita, DO      . oxyCODONE (Oxy IR/ROXICODONE) immediate release tablet 2.5-5 mg  2.5-5 mg Oral Q4H PRN Sueanne Margarita, DO   2.5 mg at 10/04/20 0328  . phosphorus (K PHOS NEUTRAL) tablet 250 mg  250 mg Oral TID Kc, Ramesh, MD      . polyethylene glycol (MIRALAX / GLYCOLAX) packet 17 g  17 g Oral BID Sueanne Margarita, DO   17 g at 10/04/20 0936  . tamsulosin (FLOMAX) capsule 0.4 mg  0.4 mg Oral  Daily Sueanne Margarita, DO   0.4 mg at 10/04/20 0935  . vitamin B-12 (CYANOCOBALAMIN) tablet 1,000 mcg  1,000 mcg Oral Daily Kc, Ramesh, MD   1,000 mcg at 10/04/20 5520     Objective: Vital signs in last 24 hours: Temp:  [97.8 F (36.6 C)-98.7 F (37.1 C)] 98 F (36.7 C) (12/07 0615) Pulse Rate:  [87-92] 87 (12/07 0615) Resp:  [18] 18 (12/07 0615) BP: (107-109)/(54-63) 109/54 (12/07 0615) SpO2:  [93 %-98 %] 93 % (12/07 0615) Weight:  [58.3 kg] 58.3 kg (12/07 0524)  Intake/Output from previous day: 12/06 0701 - 12/07 0700 In: 3032 [P.O.:1320; I.V.:1612; IV Piggyback:100.1] Out: 3500 [Urine:3500] Intake/Output this shift: No intake/output data recorded.   Physical Exam Vitals reviewed.  Constitutional:      Appearance: Normal appearance.  Neurological:     Mental Status: He is alert.     Lab Results:  Recent Labs    10/02/20 0119 10/03/20 0515  WBC 19.7* 14.2*  HGB 12.1* 9.0*  HCT 38.3* 28.7*  PLT 430* 298   BMET Recent Labs    10/03/20 0515 10/04/20 0606  NA 135 137  K 4.0 4.4  CL 108 105  CO2 21* 25  GLUCOSE 118* 108*  BUN 26* 17   CREATININE 1.05 0.83  CALCIUM 11.0* 10.9*   PT/INR No results for input(s): LABPROT, INR in the last 72 hours. ABG No results for input(s): PHART, HCO3 in the last 72 hours.  Invalid input(s): PCO2, PO2  Studies/Results: DG Abd 1 View  Result Date: 10/03/2020 CLINICAL DATA:  Right ureteral stone EXAM: ABDOMEN - 1 VIEW COMPARISON:  10/02/2020 FINDINGS: Gaseous distension large and small bowel loops within the abdomen. Moderate volume stool within the right hemicolon. Numerous calcifications project over the right renal shadow. Known proximal right ureteral calculus is not definitively seen. IMPRESSION: 1. Numerous calcifications project over the right renal shadow. 2. Known proximal right ureteral calculus is not definitively seen. 3. Air-filled loops of large and small bowel suggesting ileus. Electronically Signed   By: Davina Poke D.O.   On: 10/03/2020 08:32     Assessment and Plan: 1. Right ureteral stone.   He is not having right flank pain and the stone was not obstructing on CT.  2. Bladder mass of uncertain etiology.  Because of his living situation I think evaluation during this admission is advisable.   I am going to take him to the OR on Thursday for cystoscopy and possible biopsy/resection of the bladder mass and I will do a right retrograde and consider ureteroscopy for the stone if indicated.   I have reviewed the risks of bleeding, infection, injury to the bladder, ureter or urethra with the possible need for a stent or catheter, thrombotic events and anesthetic complications.        LOS: 2 days    Jamie Wilson 10/04/[phone] ID: Jamie Wilson, male   DOB: [birth year], 64 y.o.   MRN: [id number]

## 2020-10-04 NOTE — Plan of Care (Signed)

## 2020-10-04 NOTE — TOC Initial Note (Signed)
Transition of Care [facility]) - Initial/Assessment Note    Patient Details  Name: Jamie Wilson MRN: [id number] Date of Birth: [birth year]  Transition [facility]) CM/SW Contact:    Dessa Phi, RN Phone Number: 10/04/2020, 3:22 PM  Clinical Narrative: From Group Home-Recovery House-patient has own transport back.WOC-ostomy instruction.Monitor for d/c needs.                  Expected Discharge Plan: Home/Self Care Barriers to Discharge: Continued Medical Work up   Patient Goals and CMS Choice Patient states their goals for this hospitalization and ongoing recovery are:: return back to recovery house-Group Home CMS Medicare.gov Compare Post Acute Care list provided to:: Patient Choice offered to / list presented to : Patient  Expected Discharge Plan and Services Expected Discharge Plan: Home/Self Care   Discharge Planning Services: CM Consult   Living arrangements for the past 2 months: Single Family Home                                      Prior Living Arrangements/Services Living arrangements for the past 2 months: Single Family Home Lives with:: Facility Resident Patient language and need for interpreter reviewed:: Yes Do you feel safe going back to the place where you live?: Yes      Need for Family Participation in Patient Care: No (Comment) Care giver support system in place?: Yes (comment) Current home services: DME (w/c,cane) Criminal Activity/Legal Involvement Pertinent to Current Situation/Hospitalization: No - Comment as needed  Activities of Daily Living Home Assistive Devices/Equipment: Wheelchair ADL Screening (condition at time of admission) Patient's cognitive ability adequate to safely complete daily activities?: Yes Is the patient deaf or have difficulty hearing?: No Does the patient have difficulty seeing, even when wearing glasses/contacts?: No Does the patient have difficulty concentrating, remembering, or making decisions?: Yes Patient able  to express need for assistance with ADLs?: Yes Does the patient have difficulty dressing or bathing?: No Independently performs ADLs?: No Communication: Independent Dressing (OT): Needs assistance Does the patient have difficulty walking or climbing stairs?: Yes Weakness of Legs: Both Weakness of Arms/Hands: None  Permission Sought/Granted Permission sought to share information with : Case Manager Permission granted to share information with : Yes, Verbal Permission Granted  Share Information with NAME: Case Manager           Emotional Assessment Appearance:: Appears stated age Attitude/Demeanor/Rapport: Gracious Affect (typically observed): Accepting Orientation: : Oriented to Self, Oriented to Place, Oriented to  Time, Oriented to Situation Alcohol / Substance Use: Not Applicable Psych Involvement: No (comment)  Admission diagnosis:  Hypercalcemia [E83.52] Dehydration [E86.0] Acute kidney injury (nontraumatic) (HCC) [N17.9] Urinary tract infection without hematuria, site unspecified [N39.0] Patient Active Problem List   Diagnosis Date Noted  . Acute lower UTI 10/02/2020  . 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 Malnutrition ([place]) 10/02/2020  . Acute urinary retention 08/28/2020  . Small bowel obstruction ([place]) 08/16/2020  . Hypercalcemia 08/16/2020  . AKI (acute kidney injury) ([place]) 08/16/2020  . Right nephrolithiasis 08/16/2020   PCP:  Patient, [name] Pharmacy:   [facility] [place], [place] [address] Phone: [phone] Fax: [phone]     Social Determinants of Health (SDOH) Interventions    Readmission Risk Interventions Readmission Risk Prevention Plan 10/04/2020  Transportation Screening Complete  PCP  or Specialist Appt within 5-7 Days Complete  Home Care Screening Complete  Medication Review (RN CM) Complete

## 2020-10-04 NOTE — Progress Notes (Addendum)
PROGRESS NOTE    Jamie Wilson  HLK:[id number] DOB: [birth year] DOA: 10/02/2020 PCP: Patient, [name]   Chief Complaint  Patient presents with  . Flank Pain  . 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  He has been having pain and got worse 12/5. As per report-Pain is rated at 10/10.  Nothing makes it better, nothing makes it worse.  He denies fever, chills, sweats.  He denies nausea, vomiting, diarrhea.  He states that he has not eaten for the last 2 days because of pain.  He had been hospitalized a month ago and was referred to urology for follow-up but did not go because of pain and inability to get a ride.  He states he does not have any opioid pain medicine at home but has been taking quite a few ibuprofen.    In October, patient was hypercalcemic and it seems like a work-up was done without revealing an etiology. In the ED, initially tachycardic but this then resolved to a heart rate of 96, WBC 19.7, Hgb 12.1, PLT 430, NA 135, K5.0, CO2 22, SCR 1.27, calcium 13.5, lactic acid 1.9, glucose 69, UA showed leukocytes, protein and ketones, microscopy showed greater than 50 white blood cells. Started on abx  Subjective:  Complains of ongoing pain but Foley is draining well he reports "some stuff" came out  Does not feel ready for home today has not walked, waiting for PT.    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  Urology is working on scheduling patient for cystoscopy, possible bladder biopsy,  possible TURBT as outpatient. KUB 12/6 shows numerous calcifications project over the right renal shadow,  proximal right ureteral calculus is not definitely seen air-filled loops of large and small bowel question ileus-but no GI symptoms.  Continue Foley catheter continue IV fluid hydration. Informed by Dr Jeffie Pollock that he is putting the patient on the schedule for Thursday  Or to evaluate for bladder mass and potentially to see if the stone on the right needs to be removed.  Hematuria/purulent discharge with bilateral nephrolithiasis, possible UTI, continue IV antibiotics, IV fluids as above.Had mild leukocytosis but afebrile.  Repeat CBC in the morning.   Hypercalcemia unclear etiology,? malignancy.  Intact PTH was low in October follow-up intact PTH is appropriately suppressed.  Check vitamin D level, PTH RP not done as no frozen plasma received-repeat PTHrp. Status post Zometa, continue IV fluids.  Calcium improved at 10.9.  He will need close follow-up with lab monitoring, he does not need PCP TOC consulted.  Hypohosphatemia- phos at 2.1- add replacement.  Chronic urine retention doing self cath.  Has indwelling Foley.Continue Flomax.   Hypotension likely multifactorial monitor closely as needed IV fluid boluses.  No recurrence.  B12 deficiency at 142, starting replacement with IM x1 then on high-dose B12 PO daily supplementation  Mild metabolic acidosis bicarb 31.  Monitor  Underweight BMI 16- augment diet.  Deconditioning continue PT OT eval.  Reports patient has been weak and almost fell at home.  Nutrition: Diet Order  Diet regular Room service appropriate? Yes; Fluid consistency: Thin  Diet effective now                 Body mass index is 16.96 kg/m.  DVT prophylaxis: enoxaparin (LOVENOX) injection 40 mg Start: 10/02/20 1030 Place TED hose Start: 10/02/20 1000 Code Status:   Code Status: Full Code Family Communication: plan of care discussed with patient at  bedside.  Status is: Inpatient Remains inpatient appropriate because:IV treatments appropriate due to intensity of illness or inability to take PO and Inpatient level of care appropriate due to severity of illness  Dispo: The patient is from: Home              Anticipated d/c is to: Home              Anticipated d/c date is: 2-3 days              Patient currently is not medically stable to d/c.   Consultants:see note  Procedures:see note  Culture/Microbiology Other culture-see note  Medications: Scheduled Meds: . (feeding supplement) PROSource Plus  30 mL Oral BID BM  . acetaminophen  1,000 mg Oral Q8H  . B-complex with vitamin C  1 tablet Oral Daily  . Chlorhexidine Gluconate Cloth  6 each Topical Daily  . cholecalciferol  1,000 Units Oral Daily  . enoxaparin (LOVENOX) injection  40 mg Subcutaneous Daily  . feeding supplement  237 mL Oral BID BM  . multivitamin with minerals  1 tablet Oral Daily  . polyethylene glycol  17 g Oral BID  . tamsulosin  0.4 mg Oral Daily  . vitamin B-12  1,000 mcg Oral Daily  . vitamin B-12  1,000 mcg Oral Daily   Continuous Infusions: . cefTRIAXone (ROCEPHIN)  IV 1 g (10/03/20 2145)    Antimicrobials: Anti-infectives (From admission, onward)   Start     Dose/Rate Route Frequency Ordered Stop   10/02/20 2200  cefTRIAXone (ROCEPHIN) 1 g in sodium chloride 0.9 % 100 mL IVPB        1 g 200 mL/hr over 30 Minutes Intravenous Every 24 hours 10/02/20 0437     10/02/20 0230  cefTRIAXone (ROCEPHIN) 2 g in sodium chloride 0.9 % 100 mL IVPB        2 g 200 mL/hr over 30 Minutes Intravenous  Once 10/02/20 0226 10/02/20 0339     Objective: Vitals: Today's Vitals   10/04/20 0539 10/04/20 0615 10/04/20 0812 10/04/20 1006  BP:  (!) 109/54    Pulse:  87    Resp:  18    Temp:  98 F (36.7 C)    TempSrc:      SpO2:  93%    Weight:      Height:      PainSc: 6   0-No pain 0-No pain    Intake/Output Summary (Last 24 hours) at 10/04/2020 1048 Last  data filed at 10/04/2020 0700 Gross per 24 hour  Intake 3032.02 ml  Output 3500 ml  Net -467.98 ml   Filed Weights   10/01/20 2212 10/03/20 0500 10/04/20 0524  Weight: 57 kg 56.6 kg 58.3 kg   Weight change: 1.7 kg  Intake/Output from previous day: 12/06 0701 - 12/07 0700 In: 3032 [P.O.:1320; I.V.:1612; IV Piggyback:100.1] Out: 3500 [Urine:3500] Intake/Output this shift: No intake/output data recorded.  Examination: General exam: AAOx3 , NAD, weak appearing. HEENT:Oral mucosa moist, Ear/Nose WNL grossly, dentition normal. Respiratory system: bilaterally clear,no wheezing or crackles,no use of accessory muscle Cardiovascular  system: S1 & S2 +, No JVD,. Gastrointestinal system: Abdomen soft, NT,ND, BS+ Nervous System:Alert, awake, moving extremities and grossly nonfocal Extremities: No edema, distal peripheral pulses palpable.  Skin: No rashes,no icterus. MSK: Normal muscle bulk,tone, power Foley catheter in place with clear urine.  Data Reviewed: I have personally reviewed following labs and imaging studies CBC: Recent Labs  Lab 10/02/20 0119 10/03/20 0515  WBC 19.7* 14.2*  NEUTROABS 16.3*  --   HGB 12.1* 9.0*  HCT 38.3* 28.7*  MCV 89.5 90.5  PLT 430* 938   Basic Metabolic Panel: Recent Labs  Lab 10/02/20 0119 10/02/20 0247 10/02/20 0825 10/03/20 0515 10/04/20 0606  NA 135  --  134* 135 137  K 5.0  --  3.7 4.0 4.4  CL 104  --  107 108 105  CO2 22  --  22 21* 25  GLUCOSE 89  --  87 118* 108*  BUN 40*  --  34* 26* 17  CREATININE 1.27*  --  0.88 1.05 0.83  CALCIUM 13.5* 12.8* 11.9* 11.0* 10.9*  PHOS  --   --  2.1*  --   --    GFR: Estimated Creatinine Clearance: 73.2 mL/min (by C-G formula based on SCr of 0.83 mg/dL). Liver Function Tests: Recent Labs  Lab 10/02/20 0119 10/02/20 0825 10/03/20 0515 10/04/20 0606  AST 11* 9* 12* 15  ALT 13 11 11 15   ALKPHOS 66 49 52 55  BILITOT 0.4 0.4 0.3 0.3  PROT 7.5 6.1* 5.3* 5.9*  ALBUMIN 3.0* 2.4* 2.1* 2.3*    Recent Labs  Lab 10/02/20 0119  LIPASE 35   No results for input(s): AMMONIA in the last 168 hours. Coagulation Profile: No results for input(s): INR, PROTIME in the last 168 hours. Cardiac Enzymes: No results for input(s): CKTOTAL, CKMB, CKMBINDEX, TROPONINI in the last 168 hours. BNP (last 3 results) No results for input(s): PROBNP in the last 8760 hours. HbA1C: No results for input(s): HGBA1C in the last 72 hours. CBG: Recent Labs  Lab 10/02/20 2135 10/03/20 0734 10/04/20 0727  GLUCAP 104* 90 106*   Lipid Profile: No results for input(s): CHOL, HDL, LDLCALC, TRIG, CHOLHDL, LDLDIRECT in the last 72 hours. Thyroid Function Tests: Recent Labs    10/02/20 1053  TSH 2.355   Anemia Panel: Recent Labs    10/02/20 1053  VITAMINB12 142*   Sepsis Labs: Recent Labs  Lab 10/02/20 0227 10/02/20 0427  LATICACIDVEN 1.2 1.9    Recent Results (from the past 240 hour(s))  Urine culture     Status: Abnormal   Collection Time: 10/02/20  2:27 AM   Specimen: Urine, Clean Catch  Result Value Ref Range Status   Specimen Description   Final    URINE, CLEAN CATCH Performed at [facility], [place] [address], [place], [place] [address]    Special Requests   Final    NONE Performed at [facility], [place] [address], [place], [place] [address]    Culture MULTIPLE SPECIES PRESENT, SUGGEST RECOLLECTION (A)  Final   Report Status 10/03/2020 FINAL  Final  Resp Panel by RT-PCR (Flu A&B, Covid) Nasopharyngeal Swab     Status: None   Collection Time: 10/02/20  3:03 AM   Specimen: Nasopharyngeal Swab; Nasopharyngeal(NP) swabs in vial transport medium  Result Value Ref Range Status   SARS Coronavirus 2 by RT PCR NEGATIVE NEGATIVE Final    Comment: (NOTE) SARS-CoV-2 target nucleic acids are NOT DETECTED.  The SARS-CoV-2 RNA is generally detectable  in upper respiratory specimens during the acute phase of infection. The lowest concentration of  SARS-CoV-2 viral copies this assay can detect is 138 copies/mL. A negative result does not preclude SARS-Cov-2 infection and should not be used as the sole basis for treatment or other patient management decisions. A negative result may occur with  improper specimen collection/handling, submission of specimen other than nasopharyngeal swab, presence of viral mutation(s) within the areas targeted by this assay, and inadequate number of viral copies(<138 copies/mL). A negative result must be combined with clinical observations, patient history, and epidemiological information. The expected result is Negative.  Fact Sheet for Patients:  EntrepreneurPulse.com.au  Fact Sheet for Healthcare Providers:  IncredibleEmployment.be  This test is no t yet approved or cleared by the [place] FDA and  has been authorized for detection and/or diagnosis of SARS-CoV-2 by FDA under an Emergency Use Authorization (EUA). This EUA will remain  in effect (meaning this test can be used) for the duration of the COVID-19 declaration under Section 564(b)(1) of the Act, 21 U.S.C.section 360bbb-3(b)(1), unless the authorization is terminated  or revoked sooner.       Influenza A by PCR NEGATIVE NEGATIVE Final   Influenza B by PCR NEGATIVE NEGATIVE Final    Comment: (NOTE) The Xpert Xpress SARS-CoV-2/FLU/RSV plus assay is intended as an aid in the diagnosis of influenza from Nasopharyngeal swab specimens and should not be used as a sole basis for treatment. Nasal washings and aspirates are unacceptable for Xpert Xpress SARS-CoV-2/FLU/RSV testing.  Fact Sheet for Patients: EntrepreneurPulse.com.au  Fact Sheet for Healthcare Providers: IncredibleEmployment.be  This test is not yet approved or cleared by the [place] FDA and has been authorized for detection and/or diagnosis of SARS-CoV-2 by FDA under an Emergency Use  Authorization (EUA). This EUA will remain in effect (meaning this test can be used) for the duration of the COVID-19 declaration under Section 564(b)(1) of the Act, 21 U.S.C. section 360bbb-3(b)(1), unless the authorization is terminated or revoked.  Performed at [facility], [place] [address], [place], [place] [address]      Radiology Studies: DG Abd 1 View  Result Date: 10/03/2020 CLINICAL DATA:  Right ureteral stone EXAM: ABDOMEN - 1 VIEW COMPARISON:  10/02/2020 FINDINGS: Gaseous distension large and small bowel loops within the abdomen. Moderate volume stool within the right hemicolon. Numerous calcifications project over the right renal shadow. Known proximal right ureteral calculus is not definitively seen. IMPRESSION: 1. Numerous calcifications project over the right renal shadow. 2. Known proximal right ureteral calculus is not definitively seen. 3. Air-filled loops of large and small bowel suggesting ileus. Electronically Signed   By: Davina Poke D.O.   On: 10/03/2020 08:32     LOS: 2 days   Antonieta Pert, MD [facility]  10/04/2020, 10:48 AM

## 2020-10-04 NOTE — Progress Notes (Signed)
PT Cancellation Note  Patient Details Name: Jamie Wilson MRN: [id number] DOB: Nov 25, 1955   Cancelled Treatment:    Reason Eval/Treat Not Completed:  Attempted PT eval-pt declined to participate. Will check back if/as schedule allows.    [facility]  Office: [phone] Pager: [phone]

## 2020-10-05 DIAGNOSIS — E46 Unspecified protein-calorie malnutrition: Secondary | ICD-10-CM

## 2020-10-05 DIAGNOSIS — N201 Calculus of ureter: Secondary | ICD-10-CM

## 2020-10-05 DIAGNOSIS — N3289 Other specified disorders of bladder: Secondary | ICD-10-CM

## 2020-10-05 DIAGNOSIS — E871 Hypo-osmolality and hyponatremia: Secondary | ICD-10-CM

## 2020-10-05 DIAGNOSIS — E860 Dehydration: Secondary | ICD-10-CM

## 2020-10-05 LAB — COMPREHENSIVE METABOLIC PANEL
ALT: 15 U/L (ref 0–44)
AST: 13 U/L — ABNORMAL LOW (ref 15–41)
Albumin: 2.3 g/dL — ABNORMAL LOW (ref 3.5–5.0)
Alkaline Phosphatase: 61 U/L (ref 38–126)
Anion gap: 6 (ref 5–15)
BUN: 16 mg/dL (ref 8–23)
CO2: 21 mmol/L — ABNORMAL LOW (ref 22–32)
Calcium: 10.3 mg/dL (ref 8.9–10.3)
Chloride: 106 mmol/L (ref 98–111)
Creatinine, Ser: 0.78 mg/dL (ref 0.61–1.24)
GFR, Estimated: >60 mL/min (ref >60)
Glucose, Bld: 94 mg/dL (ref 70–99)
Potassium: 3.9 mmol/L (ref 3.5–5.1)
Sodium: 133 mmol/L — ABNORMAL LOW (ref 135–145)
Total Bilirubin: 0.4 mg/dL (ref 0.3–1.2)
Total Protein: 5.9 g/dL — ABNORMAL LOW (ref 6.5–8.1)

## 2020-10-05 LAB — CBC
HCT: 31.3 % — ABNORMAL LOW (ref 39.0–52.0)
Hemoglobin: 9.7 g/dL — ABNORMAL LOW (ref 13.0–17.0)
MCH: 28.3 pg (ref 26.0–34.0)
MCHC: 31 g/dL (ref 30.0–36.0)
MCV: 91.3 fL (ref 80.0–100.0)
Platelets: 309 10*3/uL (ref 150–400)
RBC: 3.43 MIL/uL — ABNORMAL LOW (ref 4.22–5.81)
RDW: 13.8 % (ref 11.5–15.5)
WBC: 16.7 10*3/uL — ABNORMAL HIGH (ref 4.0–10.5)
nRBC: 0 % (ref 0.0–0.2)

## 2020-10-05 LAB — PHOSPHORUS: Phosphorus: 1.7 mg/dL — ABNORMAL LOW (ref 2.5–4.6)

## 2020-10-05 LAB — GLUCOSE, CAPILLARY: Glucose-Capillary: 85 mg/dL (ref 70–99)

## 2020-10-05 MED ORDER — POTASSIUM PHOSPHATES 15 MMOLE/5ML IV SOLN
20.0000 mmol | Freq: Once | INTRAVENOUS | Status: AC
  Administered 2020-10-05: 20 mmol via INTRAVENOUS
  Filled 2020-10-05: qty 6.67

## 2020-10-05 MED ORDER — OXYCODONE HCL 5 MG PO TABS
5.0000 mg | ORAL_TABLET | ORAL | Status: DC | PRN
  Administered 2020-10-05 – 2020-10-07 (×6): 10 mg via ORAL
  Administered 2020-10-08: 5 mg via ORAL
  Administered 2020-10-08 – 2020-10-09 (×5): 10 mg via ORAL
  Administered 2020-10-09: 5 mg via ORAL
  Administered 2020-10-10 (×4): 10 mg via ORAL
  Administered 2020-10-11: 5 mg via ORAL
  Administered 2020-10-11 – 2020-10-13 (×10): 10 mg via ORAL
  Filled 2020-10-05: qty 1
  Filled 2020-10-05 (×14): qty 2
  Filled 2020-10-05: qty 1
  Filled 2020-10-05 (×4): qty 2
  Filled 2020-10-05: qty 1
  Filled 2020-10-05 (×8): qty 2

## 2020-10-05 NOTE — Progress Notes (Signed)
Subjective:  Mr. Obrecht had his foley irrigated with return of material that he states looked like 'dirt'.  This could be old clot or fecal material. He continues to have lower abdominal pain but it has improved.  He has been given laxatives and is having more stool output.   His UOP is good but the urine is a little turbid.   He remains on rocephin.  His culture grew Mx species.    ROS:  Review of Systems  All other systems reviewed and are negative.   Anti-infectives: Anti-infectives (From admission, onward)   Start     Dose/Rate Route Frequency Ordered Stop   10/02/20 2200  cefTRIAXone (ROCEPHIN) 1 g in sodium chloride 0.9 % 100 mL IVPB        1 g 200 mL/hr over 30 Minutes Intravenous Every 24 hours 10/02/20 0437     10/02/20 0230  cefTRIAXone (ROCEPHIN) 2 g in sodium chloride 0.9 % 100 mL IVPB        2 g 200 mL/hr over 30 Minutes Intravenous  Once 10/02/20 0226 10/02/20 0339      Current Facility-Administered Medications  Medication Dose Route Frequency Provider Last Rate Last Admin  . (feeding supplement) PROSource Plus liquid 30 mL  30 mL Oral BID BM Kc, Ramesh, MD   30 mL at 10/04/20 1950  . acetaminophen (TYLENOL) tablet 650 mg  650 mg Oral Q6H PRN Sueanne Margarita, DO       Or  . acetaminophen (TYLENOL) suppository 650 mg  650 mg Rectal Q6H PRN Sueanne Margarita, DO      . acetaminophen (TYLENOL) tablet 1,000 mg  1,000 mg Oral Q8H Skakle, [place], DO   1,000 mg at 10/05/20 0541  . B-complex with vitamin C tablet 1 tablet  1 tablet Oral Daily Sueanne Margarita, DO   1 tablet at 10/04/20 0936  . bisacodyl (DULCOLAX) EC tablet 5 mg  5 mg Oral Daily PRN Sueanne Margarita, DO      . cefTRIAXone (ROCEPHIN) 1 g in sodium chloride 0.9 % 100 mL IVPB  1 g Intravenous Q24H Jennette Kettle M, DO 200 mL/hr at 10/04/20 2225 1 g at 10/04/20 2225  . Chlorhexidine Gluconate Cloth 2 % PADS 6 each  6 each Topical Daily Sueanne Margarita, DO   6 each at 10/04/20 0936  . enoxaparin (LOVENOX) injection 40 mg   40 mg Subcutaneous Daily Sueanne Margarita, DO   40 mg at 10/04/20 0936  . feeding supplement (ENSURE ENLIVE / ENSURE PLUS) liquid 237 mL  237 mL Oral BID BM Kc, Ramesh, MD   237 mL at 10/04/20 1312  . morphine 2 MG/ML injection 2 mg  2 mg Intravenous Q4H PRN Antonieta Pert, MD   2 mg at 10/05/20 0423  . multivitamin with minerals tablet 1 tablet  1 tablet Oral Daily Sueanne Margarita, DO   1 tablet at 10/04/20 0936  . ondansetron (ZOFRAN) tablet 4 mg  4 mg Oral Q6H PRN Sueanne Margarita, DO       Or  . ondansetron [facility]) injection 4 mg  4 mg Intravenous Q6H PRN Sueanne Margarita, DO      . oxyCODONE (Oxy IR/ROXICODONE) immediate release tablet 2.5-5 mg  2.5-5 mg Oral Q4H PRN Sueanne Margarita, DO   2.5 mg at 10/04/20 0328  . phosphorus (K PHOS NEUTRAL) tablet 250 mg  250 mg Oral TID Antonieta Pert, MD   250 mg at 10/04/20 2223  . polyethylene glycol (MIRALAX / GLYCOLAX) packet 17 g  17 g Oral BID Sueanne Margarita, DO   17 g at 10/04/20 3846  . tamsulosin (FLOMAX) capsule 0.4 mg  0.4 mg Oral Daily Sueanne Margarita, DO   0.4 mg at 10/04/20 0935  . vitamin B-12 (CYANOCOBALAMIN) tablet 1,000 mcg  1,000 mcg Oral Daily Kc, Ramesh, MD   1,000 mcg at 10/04/20 6599     Objective: Vital signs in last 24 hours: Temp:  [97.8 F (36.6 C)-100.4 F (38 C)] 100.4 F (38 C) (12/08 0440) Pulse Rate:  [83-100] 83 (12/08 0440) Resp:  [16-18] 18 (12/08 0440) BP: (100-123)/(62-75) 100/62 (12/08 0440) SpO2:  [87 %-98 %] 98 % (12/08 0440) Weight:  [59.6 kg] 59.6 kg (12/08 0500)  Intake/Output from previous day: 12/07 0701 - 12/08 0700 In: 584 [P.O.:480; IV Piggyback:104] Out: 2200 [Urine:2200] Intake/Output this shift: No intake/output data recorded.   Physical Exam Vitals reviewed.  Constitutional:      Appearance: Normal appearance.  Neurological:     Mental Status: He is alert.     Lab Results:  Recent Labs    10/03/20 0515 10/05/20 0739  WBC 14.2* 16.7*  HGB 9.0* 9.7*  HCT 28.7* 31.3*  PLT 298 309    BMET Recent Labs    10/04/20 0606 10/05/20 0739  NA 137 133*  K 4.4 3.9  CL 105 106  CO2 25 21*  GLUCOSE 108* 94  BUN 17 16  CREATININE 0.83 0.78  CALCIUM 10.9* 10.3   PT/INR No results for input(s): LABPROT, INR in the last 72 hours. ABG No results for input(s): PHART, HCO3 in the last 72 hours.  Invalid input(s): PCO2, PO2  Studies/Results: DG Abd 1 View  Result Date: 10/03/2020 CLINICAL DATA:  Right ureteral stone EXAM: ABDOMEN - 1 VIEW COMPARISON:  10/02/2020 FINDINGS: Gaseous distension large and small bowel loops within the abdomen. Moderate volume stool within the right hemicolon. Numerous calcifications project over the right renal shadow. Known proximal right ureteral calculus is not definitively seen. IMPRESSION: 1. Numerous calcifications project over the right renal shadow. 2. Known proximal right ureteral calculus is not definitively seen. 3. Air-filled loops of large and small bowel suggesting ileus. Electronically Signed   By: Davina Poke D.O.   On: 10/03/2020 08:32     Assessment and Plan: 1. Right ureteral stone.   He is not having right flank pain and the stone was not obstructing on CT but I will evaluate in the OR tomorrow.   2. Bladder mass of uncertain etiology.  Irrigation removed some debris but he still needs evaluation.   I am going to take him to the OR on Thursday for cystoscopy and possible biopsy/resection of the bladder mass and I will do a right retrograde and consider ureteroscopy for the stone if indicated.   I have reviewed the risks of bleeding, infection, injury to the bladder, ureter or urethra with the possible need for a stent or catheter, thrombotic events and anesthetic complications.        LOS: 3 days    Irine Seal 10/05/2020 [phone] ID: Clance Boll, male   DOB: 1955/11/11, 64 y.o.   MRN: [id number] Patient ID: Truman Aceituno, male   DOB: May 24, 1956, 64 y.o.   MRN: [id number]

## 2020-10-05 NOTE — Progress Notes (Signed)
Foley hand irrigated multiple times this shift with a return of malodorous, green/yellow urine with sediment. Pt reports mild pain relief after hand irrigation.

## 2020-10-05 NOTE — Progress Notes (Signed)
PT Cancellation Note  Patient Details Name: Jamie Wilson MRN: [id number] DOB: 08/01/1956   Cancelled Treatment:    Reason Eval/Treat Not Completed: Pain limiting ability to participate;Other (comment) (pt refused to attempt any mobility, he c/o pain despite having morphine ~1 hour ago. He requested PT attempt after he has surgery, which is scheduled for tomorrow. Will follow.)   Philomena Doheny PT 10/05/2020  Acute Rehabilitation Services Pager [phone] Office [phone]

## 2020-10-05 NOTE — H&P (View-Only) (Signed)
Subjective:  Jamie Wilson had his foley irrigated with return of material that he states looked like 'dirt'.  This could be old clot or fecal material. He continues to have lower abdominal pain but it has improved.  He has been given laxatives and is having more stool output.   His UOP is good but the urine is a little turbid.   He remains on rocephin.  His culture grew Mx species.    ROS:  Review of Systems  All other systems reviewed and are negative.   Anti-infectives: Anti-infectives (From admission, onward)   Start     Dose/Rate Route Frequency Ordered Stop   10/02/20 2200  cefTRIAXone (ROCEPHIN) 1 g in sodium chloride 0.9 % 100 mL IVPB        1 g 200 mL/hr over 30 Minutes Intravenous Every 24 hours 10/02/20 0437     10/02/20 0230  cefTRIAXone (ROCEPHIN) 2 g in sodium chloride 0.9 % 100 mL IVPB        2 g 200 mL/hr over 30 Minutes Intravenous  Once 10/02/20 0226 10/02/20 0339      Current Facility-Administered Medications  Medication Dose Route Frequency Provider Last Rate Last Admin  . (feeding supplement) PROSource Plus liquid 30 mL  30 mL Oral BID BM Kc, Ramesh, MD   30 mL at 10/04/20 1950  . acetaminophen (TYLENOL) tablet 650 mg  650 mg Oral Q6H PRN Sueanne Margarita, DO       Or  . acetaminophen (TYLENOL) suppository 650 mg  650 mg Rectal Q6H PRN Sueanne Margarita, DO      . acetaminophen (TYLENOL) tablet 1,000 mg  1,000 mg Oral Q8H Skakle, Austin, DO   1,000 mg at 10/05/20 0541  . B-complex with vitamin C tablet 1 tablet  1 tablet Oral Daily Sueanne Margarita, DO   1 tablet at 10/04/20 0936  . bisacodyl (DULCOLAX) EC tablet 5 mg  5 mg Oral Daily PRN Sueanne Margarita, DO      . cefTRIAXone (ROCEPHIN) 1 g in sodium chloride 0.9 % 100 mL IVPB  1 g Intravenous Q24H Jennette Kettle M, DO 200 mL/hr at 10/04/20 2225 1 g at 10/04/20 2225  . Chlorhexidine Gluconate Cloth 2 % PADS 6 each  6 each Topical Daily Sueanne Margarita, DO   6 each at 10/04/20 0936  . enoxaparin (LOVENOX) injection 40 mg   40 mg Subcutaneous Daily Sueanne Margarita, DO   40 mg at 10/04/20 0936  . feeding supplement (ENSURE ENLIVE / ENSURE PLUS) liquid 237 mL  237 mL Oral BID BM Kc, Ramesh, MD   237 mL at 10/04/20 1312  . morphine 2 MG/ML injection 2 mg  2 mg Intravenous Q4H PRN Antonieta Pert, MD   2 mg at 10/05/20 0423  . multivitamin with minerals tablet 1 tablet  1 tablet Oral Daily Sueanne Margarita, DO   1 tablet at 10/04/20 0936  . ondansetron (ZOFRAN) tablet 4 mg  4 mg Oral Q6H PRN Sueanne Margarita, DO       Or  . ondansetron [facility]) injection 4 mg  4 mg Intravenous Q6H PRN Sueanne Margarita, DO      . oxyCODONE (Oxy IR/ROXICODONE) immediate release tablet 2.5-5 mg  2.5-5 mg Oral Q4H PRN Sueanne Margarita, DO   2.5 mg at 10/04/20 0328  . phosphorus (K PHOS NEUTRAL) tablet 250 mg  250 mg Oral TID Antonieta Pert, MD   250 mg at 10/04/20 2223  . polyethylene glycol (MIRALAX / GLYCOLAX) packet 17 g  17 g Oral BID Sueanne Margarita, DO   17 g at 10/04/20 7741  . tamsulosin (FLOMAX) capsule 0.4 mg  0.4 mg Oral Daily Sueanne Margarita, DO   0.4 mg at 10/04/20 0935  . vitamin B-12 (CYANOCOBALAMIN) tablet 1,000 mcg  1,000 mcg Oral Daily Kc, Ramesh, MD   1,000 mcg at 10/04/20 2878     Objective: Vital signs in last 24 hours: Temp:  [97.8 F (36.6 C)-100.4 F (38 C)] 100.4 F (38 C) (12/08 0440) Pulse Rate:  [83-100] 83 (12/08 0440) Resp:  [16-18] 18 (12/08 0440) BP: (100-123)/(62-75) 100/62 (12/08 0440) SpO2:  [87 %-98 %] 98 % (12/08 0440) Weight:  [59.6 kg] 59.6 kg (12/08 0500)  Intake/Output from previous day: 12/07 0701 - 12/08 0700 In: 584 [P.O.:480; IV Piggyback:104] Out: 2200 [Urine:2200] Intake/Output this shift: No intake/output data recorded.   Physical Exam Vitals reviewed.  Constitutional:      Appearance: Normal appearance.  Neurological:     Mental Status: He is alert.     Lab Results:  Recent Labs    10/03/20 0515 10/05/20 0739  WBC 14.2* 16.7*  HGB 9.0* 9.7*  HCT 28.7* 31.3*  PLT 298 309    BMET Recent Labs    10/04/20 0606 10/05/20 0739  NA 137 133*  K 4.4 3.9  CL 105 106  CO2 25 21*  GLUCOSE 108* 94  BUN 17 16  CREATININE 0.83 0.78  CALCIUM 10.9* 10.3   PT/INR No results for input(s): LABPROT, INR in the last 72 hours. ABG No results for input(s): PHART, HCO3 in the last 72 hours.  Invalid input(s): PCO2, PO2  Studies/Results: DG Abd 1 View  Result Date: 10/03/2020 CLINICAL DATA:  Right ureteral stone EXAM: ABDOMEN - 1 VIEW COMPARISON:  10/02/2020 FINDINGS: Gaseous distension large and small bowel loops within the abdomen. Moderate volume stool within the right hemicolon. Numerous calcifications project over the right renal shadow. Known proximal right ureteral calculus is not definitively seen. IMPRESSION: 1. Numerous calcifications project over the right renal shadow. 2. Known proximal right ureteral calculus is not definitively seen. 3. Air-filled loops of large and small bowel suggesting ileus. Electronically Signed   By: Davina Poke D.O.   On: 10/03/2020 08:32     Assessment and Plan: 1. Right ureteral stone.   He is not having right flank pain and the stone was not obstructing on CT but I will evaluate in the OR tomorrow.   2. Bladder mass of uncertain etiology.  Irrigation removed some debris but he still needs evaluation.   I am going to take him to the OR on Thursday for cystoscopy and possible biopsy/resection of the bladder mass and I will do a right retrograde and consider ureteroscopy for the stone if indicated.   I have reviewed the risks of bleeding, infection, injury to the bladder, ureter or urethra with the possible need for a stent or catheter, thrombotic events and anesthetic complications.        LOS: 3 days    Jamie Wilson 10/05/2020 [phone] ID: Jamie Wilson, male   DOB: 1956-07-02, 64 y.o.   MRN: [id number] Patient ID: Jamie Wilson, male   DOB: Dec 21, 1955, 64 y.o.   MRN: [id number]

## 2020-10-05 NOTE — Progress Notes (Signed)
[facility]  PROGRESS NOTE  Jamie Wilson IOX:[id number] DOB: 02/04/1956 DOA: 10/02/2020 PCP: Patient, [name] Admit date - 10/02/2020   Admitting Physician Sueanne Margarita, DO  Outpatient Primary MD for the patient is Patient, [name]  LOS - 3 Brief Narrative  [name] is a 64 year old male with medical history significant for chronic urinary retention with self-catheterization, history of small bowel obstruction requiring bowel resection and colostomy remotely, with recent outpatient history of hematuria despite several rounds of outpatient antibiotics for presumed UTI who presented with worsening lower abdominal pain and flank pain and was found to have hypercalcemia with calcium of 13.5 on admission, concern for UTI started on IV ceftriaxone and concern for new bladder mass on CT imaging.  Subjective  Today reports still having some lower abdomen pain  A & P   Bladder mass of uncertain etiology. Found on CT imaging. Concern for malignancy. Still having residual lower belly pain -Urology plans for cystoscopy and possible biopsy/resection of the bladder mass on 12/9 -Pain regimen IV morphine as needed for severe pain, increase oxycodone 5-10 mg as needed moderate pain, scheduled Tylenol, closely monitor  Right ureteral stone, nonobstructing. No current flank pain. -Urology will evaluate in OR on 12/9 -Continue Flomax via hematuria.  Reported ileus on abdominal imaging. Still having adequate output from ostomy -We will continue to closely monitor -Continue MiraLAX given hypercalcemia can worsen any constipation  Prior to mission hematuria with purulent discharge, concerning for possible UTI. Suspect related to inflammatory process from bladder mass patient is at risk for infection given chronic indwelling Foley. -Agree with continued IV ceftriaxone for now, until urologic procedure on 12/9  -continue IV ceftriaxone  Hypercalcemia, unclear etiology. Differential includes  possible malignancy, dehydration/poor mobility. Vitamin D level within normal limits peak of 13 on admission, currently 10.9 status post [place]med[place] -Follow-up PTH RP -Continue IV fluids  Hyponatremia, mild, asymptomatic. Looks a bit volume down on exam.  --Continue IVF, monitor BMP  Hypophosphatemia -continue replacement and monitor  Chronic urinary retention for several years. 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 Self caths at home. Likely multifactorial etiology including bladder mass possible UTI, hypercalcemia -Continue indwelling Foley, Flomax, IV ceftriaxone  Leukocytosis, slightly worse over the past 24 hours. Remains afebrile. Unclear if stress reaction or actual infection -Continue IV ceftriaxone for possible UTI given mass -Has no other localizing signs or symptoms of infection, remains afebrile  Normocytic anemia, currently stable with hemoglobin at 9-9.7. No bleeding episodes.  suspect anemia of chronic disease -Monitor CBC  Underweight with BMI less than 18 -Nutrition consulted, assisting with supplementation -Continue multivitamins    Family Communication  : None at bedside  Code Status : Full  Disposition Plan  :  Patient is from home. Anticipated d/c date: 2 to 3 days. Barriers to d/c or necessity for inpatient status:  Consults  : Urology  Procedures  :    DVT Prophylaxis  :  Lovenox -to be held after midnight prior to procedure  MDM: The below labs and imaging reports were reviewed and summarized above.  Medication management as above.  Lab Results  Component Value Date   PLT 309 10/05/2020    Diet :  Diet Order            Diet NPO time specified  Diet effective midnight           Diet regular Room service appropriate? Yes; Fluid consistency: Thin  Diet effective now  Inpatient Medications Scheduled Meds: . (feeding supplement) PROSource Plus  30 mL Oral BID BM  . acetaminophen  1,000 mg Oral Q8H  . B-complex with vitamin C  1  tablet Oral Daily  . Chlorhexidine Gluconate Cloth  6 each Topical Daily  . feeding supplement  237 mL Oral BID BM  . multivitamin with minerals  1 tablet Oral Daily  . polyethylene glycol  17 g Oral BID  . tamsulosin  0.4 mg Oral Daily  . vitamin B-12  1,000 mcg Oral Daily   Continuous Infusions: . cefTRIAXone (ROCEPHIN)  IV 1 g (10/04/20 2225)  . potassium PHOSPHATE IVPB (in mmol) 20 mmol (10/05/20 1202)   PRN Meds:.acetaminophen **OR** acetaminophen, bisacodyl, morphine injection, ondansetron **OR** ondansetron (ZOFRAN) IV, oxyCODONE  Antibiotics  :   Anti-infectives (From admission, onward)   Start     Dose/Rate Route Frequency Ordered Stop   10/02/20 2200  cefTRIAXone (ROCEPHIN) 1 g in sodium chloride 0.9 % 100 mL IVPB        1 g 200 mL/hr over 30 Minutes Intravenous Every 24 hours 10/02/20 0437     10/02/20 0230  cefTRIAXone (ROCEPHIN) 2 g in sodium chloride 0.9 % 100 mL IVPB        2 g 200 mL/hr over 30 Minutes Intravenous  Once 10/02/20 0226 10/02/20 0339       Objective   Vitals:   10/04/20 2119 10/05/20 0440 10/05/20 0500 10/05/20 1246  BP: 115/71 100/62  128/72  Pulse: 97 83  96  Resp: 18 18  20   Temp: 98.6 F (37 C) (!) 100.4 F (38 C)  97.9 F (36.6 C)  TempSrc: Oral Oral  Oral  SpO2: 98% 98%  96%  Weight:   59.6 kg   Height:        SpO2: 96 %  Wt Readings from Last 3 Encounters:  10/05/20 59.6 kg  08/28/20 61.1 kg  07/20/20 68 kg     Intake/Output Summary (Last 24 hours) at 10/05/2020 1623 Last data filed at 10/05/2020 1543 Gross per 24 hour  Intake 742.45 ml  Output 3500 ml  Net -2757.55 ml    Physical Exam:  Thin, frail male, in no distress Ostomy bag in place with stool present, abdomen soft, slightly distended but nontender, diminished bowel sounds Foley catheter in place draining minimal amount of clear yellow urine Regular rate and rhythm, no appreciable murmurs Normal respiratory effort on room air      I have personally  reviewed the following:   Data Reviewed:  CBC Recent Labs  Lab 10/02/20 0119 10/03/20 0515 10/05/20 0739  WBC 19.7* 14.2* 16.7*  HGB 12.1* 9.0* 9.7*  HCT 38.3* 28.7* 31.3*  PLT 430* 298 309  MCV 89.5 90.5 91.3  MCH 28.3 28.4 28.3  MCHC 31.6 31.4 31.0  RDW 13.9 13.7 13.8  LYMPHSABS 1.0  --   --   MONOABS 1.0  --   --   EOSABS 1.3*  --   --   BASOSABS 0.1  --   --     Chemistries  Recent Labs  Lab 10/02/20 0119 10/02/20 0247 10/02/20 0825 10/03/20 0515 10/04/20 0606 10/05/20 0739  NA 135  --  134* 135 137 133*  K 5.0  --  3.7 4.0 4.4 3.9  CL 104  --  107 108 105 106  CO2 22  --  22 21* 25 21*  GLUCOSE 89  --  87 118* 108* 94  BUN 40*  --  34* 26* 17 16  CREATININE 1.27*  --  0.88 1.05 0.83 0.78  CALCIUM 13.5* 12.8* 11.9* 11.0* 10.9* 10.3  AST 11*  --  9* 12* 15 13*  ALT 13  --  11 11 15 15   ALKPHOS 66  --  49 52 55 61  BILITOT 0.4  --  0.4 0.3 0.3 0.4   ------------------------------------------------------------------------------------------------------------------ No results for input(s): CHOL, HDL, LDLCALC, TRIG, CHOLHDL, LDLDIRECT in the last 72 hours.  Lab Results  Component Value Date   HGBA1C 5.7 (H) 08/19/2020   ------------------------------------------------------------------------------------------------------------------ No results for input(s): TSH, T4TOTAL, T3FREE, THYROIDAB in the last 72 hours.  Invalid input(s): FREET3 ------------------------------------------------------------------------------------------------------------------ No results for input(s): VITAMINB12, FOLATE, FERRITIN, TIBC, IRON, RETICCTPCT in the last 72 hours.  Coagulation profile No results for input(s): INR, PROTIME in the last 168 hours.  No results for input(s): DDIMER in the last 72 hours.  Cardiac Enzymes No results for input(s): CKMB, TROPONINI, MYOGLOBIN in the last 168 hours.  Invalid input(s):  CK ------------------------------------------------------------------------------------------------------------------ No results found for: BNP  Micro Results Recent Results (from the past 240 hour(s))  Urine culture     Status: Abnormal   Collection Time: 10/02/20  2:27 AM   Specimen: Urine, Clean Catch  Result Value Ref Range Status   Specimen Description   Final    URINE, CLEAN CATCH Performed at [facility], [place] [address] [place], [place] [address]    Special Requests   Final    NONE Performed at [facility], [place] [address], [place], [place] [address]    Culture MULTIPLE SPECIES PRESENT, SUGGEST RECOLLECTION (A)  Final   Report Status 10/03/2020 FINAL  Final  Resp Panel by RT-PCR (Flu A&B, Covid) Nasopharyngeal Swab     Status: None   Collection Time: 10/02/20  3:03 AM   Specimen: Nasopharyngeal Swab; Nasopharyngeal(NP) swabs in vial transport medium  Result Value Ref Range Status   SARS Coronavirus 2 by RT PCR NEGATIVE NEGATIVE Final    Comment: (NOTE) SARS-CoV-2 target nucleic acids are NOT DETECTED.  The SARS-CoV-2 RNA is generally detectable in upper respiratory specimens during the acute phase of infection. The lowest concentration of SARS-CoV-2 viral copies this assay can detect is 138 copies/mL. A negative result does not preclude SARS-Cov-2 infection and should not be used as the sole basis for treatment or other patient management decisions. A negative result may occur with  improper specimen collection/handling, submission of specimen other than nasopharyngeal swab, presence of viral mutation(s) within the areas targeted by this assay, and inadequate number of viral copies(<138 copies/mL). A negative result must be combined with clinical observations, patient history, and epidemiological information. The expected result is Negative.  Fact Sheet for Patients:  EntrepreneurPulse.com.au  Fact Sheet for  Healthcare Providers:  IncredibleEmployment.be  This test is no t yet approved or cleared by the [place] FDA and  has been authorized for detection and/or diagnosis of SARS-CoV-2 by FDA under an Emergency Use Authorization (EUA). This EUA will remain  in effect (meaning this test can be used) for the duration of the COVID-19 declaration under Section 564(b)(1) of the Act, 21 U.S.C.section 360bbb-3(b)(1), unless the authorization is terminated  or revoked sooner.       Influenza A by PCR NEGATIVE NEGATIVE Final   Influenza B by PCR NEGATIVE NEGATIVE Final    Comment: (NOTE) The Xpert Xpress SARS-CoV-2/FLU/RSV plus assay is intended as an aid in the diagnosis of influenza from Nasopharyngeal swab specimens and should  not be used as a sole basis for treatment. Nasal washings and aspirates are unacceptable for Xpert Xpress SARS-CoV-2/FLU/RSV testing.  Fact Sheet for Patients: EntrepreneurPulse.com.au  Fact Sheet for Healthcare Providers: IncredibleEmployment.be  This test is not yet approved or cleared by the [place] FDA and has been authorized for detection and/or diagnosis of SARS-CoV-2 by FDA under an Emergency Use Authorization (EUA). This EUA will remain in effect (meaning this test can be used) for the duration of the COVID-19 declaration under Section 564(b)(1) of the Act, 21 U.S.C. section 360bbb-3(b)(1), unless the authorization is terminated or revoked.  Performed at [facility], [place] [address] [place], [place] [address]     Radiology Reports DG Abd 1 View  Result Date: 10/03/2020 CLINICAL DATA:  Right ureteral stone EXAM: ABDOMEN - 1 VIEW COMPARISON:  10/02/2020 FINDINGS: Gaseous distension large and small bowel loops within the abdomen. Moderate volume stool within the right hemicolon. Numerous calcifications project over the right renal shadow. Known proximal right ureteral  calculus is not definitively seen. IMPRESSION: 1. Numerous calcifications project over the right renal shadow. 2. Known proximal right ureteral calculus is not definitively seen. 3. Air-filled loops of large and small bowel suggesting ileus. Electronically Signed   By: Davina Poke D.O.   On: 10/03/2020 08:32   CT Renal Stone Study  Result Date: 10/02/2020 CLINICAL DATA:  Bilateral flank pain EXAM: CT ABDOMEN AND PELVIS WITHOUT CONTRAST TECHNIQUE: Multidetector CT imaging of the abdomen and pelvis was performed following the standard protocol without IV contrast. COMPARISON:  None. FINDINGS: Lower chest: The visualized heart size within normal limits. No pericardial fluid/thickening. No hiatal hernia. The visualized portions of the lungs are clear. Hepatobiliary: Multiple low-density lesions are seen throughout the liver parenchyma the largest in the right hepatic dome measures 5.5 cm. No evidence of calcified gallstones or biliary ductal dilatation. Pancreas:  Unremarkable.  No surrounding inflammatory changes. Spleen: Normal in size. Although limited due to the lack of intravenous contrast, normal in appearance. Adrenals/Urinary Tract: Both adrenal glands appear normal there is mild right-sided pelviectasis with stranding changes. There are multiple coarse calcifications throughout the right kidney the largest measuring 8 mm within the upper pole. Within the proximal right ureter there is a 3 mm calculus present. There is calcifications seen in the lower pole the left kidney. Again noted within the bladder is heterogeneous soft tissue masslike area and wall thickening extending superiorly which appears to have a fistulous connection to the overlying small bowel and colonic loops. There now appears to be small foci of air and debris. Stomach/Bowel: The stomach and small bowel are unremarkable. There is scattered colonic diverticula with a moderate amount of colonic stool present. A left lower quadrant  ostomy is present. Vascular/Lymphatic: There are no enlarged abdominal or pelvic lymph nodes. Scattered aortic atherosclerotic calcifications are seen without aneurysmal dilatation. Reproductive: The patient is status post hysterectomy. No adnexal masses or collections seen. Other: No evidence of abdominal wall mass or hernia. Musculoskeletal: No acute or significant osseous findings. 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 Mild right pelviectasis with a proximal 3 mm ureteral calculi. Multiple bilateral non-obstructing renal calculi. Aortic Atherosclerosis (ICD10-I70.0). Electronically Signed   By: Prudencio Pair M.D.   On: 10/02/2020 03:46     Time Spent in minutes  30     Desiree Hane M.D on  10/05/2020 at 4:23 PM  To page go to [email or web address] - password [facility]

## 2020-10-06 ENCOUNTER — Encounter (HOSPITAL_COMMUNITY)
Admission: EM | Disposition: A | Discharge status: Home / Self Care | Payer: Self-pay | Source: Home / Self Care | Attending: Internal Medicine

## 2020-10-06 ENCOUNTER — Encounter (HOSPITAL_COMMUNITY): Payer: Self-pay | Admitting: Internal Medicine

## 2020-10-06 ENCOUNTER — Inpatient Hospital Stay (HOSPITAL_COMMUNITY): Payer: Medicaid Other | Admitting: Anesthesiology

## 2020-10-06 DIAGNOSIS — R338 Other retention of urine: Secondary | ICD-10-CM

## 2020-10-06 HISTORY — PX: CYSTOSCOPY/URETEROSCOPY/HOLMIUM LASER/STENT PLACEMENT: SHX6546

## 2020-10-06 HISTORY — PX: TRANSURETHRAL RESECTION OF BLADDER TUMOR: SHX2575

## 2020-10-06 LAB — CBC
HCT: 33.1 % — ABNORMAL LOW (ref 39.0–52.0)
Hemoglobin: 10.3 g/dL — ABNORMAL LOW (ref 13.0–17.0)
MCH: 28.5 pg (ref 26.0–34.0)
MCHC: 31.1 g/dL (ref 30.0–36.0)
MCV: 91.4 fL (ref 80.0–100.0)
Platelets: 365 10*3/uL (ref 150–400)
RBC: 3.62 MIL/uL — ABNORMAL LOW (ref 4.22–5.81)
RDW: 13.9 % (ref 11.5–15.5)
WBC: 16.9 10*3/uL — ABNORMAL HIGH (ref 4.0–10.5)
nRBC: 0 % (ref 0.0–0.2)

## 2020-10-06 LAB — COMPREHENSIVE METABOLIC PANEL
ALT: 15 U/L (ref 0–44)
AST: 12 U/L — ABNORMAL LOW (ref 15–41)
Albumin: 2.3 g/dL — ABNORMAL LOW (ref 3.5–5.0)
Alkaline Phosphatase: 66 U/L (ref 38–126)
Anion gap: 5 (ref 5–15)
BUN: 15 mg/dL (ref 8–23)
CO2: 25 mmol/L (ref 22–32)
Calcium: 10.5 mg/dL — ABNORMAL HIGH (ref 8.9–10.3)
Chloride: 106 mmol/L (ref 98–111)
Creatinine, Ser: 0.86 mg/dL (ref 0.61–1.24)
GFR, Estimated: >60 mL/min (ref >60)
Glucose, Bld: 101 mg/dL — ABNORMAL HIGH (ref 70–99)
Potassium: 4.1 mmol/L (ref 3.5–5.1)
Sodium: 136 mmol/L (ref 135–145)
Total Bilirubin: 0.2 mg/dL — ABNORMAL LOW (ref 0.3–1.2)
Total Protein: 6.2 g/dL — ABNORMAL LOW (ref 6.5–8.1)

## 2020-10-06 LAB — GLUCOSE, CAPILLARY: Glucose-Capillary: 81 mg/dL (ref 70–99)

## 2020-10-06 SURGERY — CYSTOSCOPY/URETEROSCOPY/HOLMIUM LASER/STENT PLACEMENT
Anesthesia: General | Site: Bladder | Laterality: Right

## 2020-10-06 MED ORDER — FENTANYL CITRATE (PF) 100 MCG/2ML IJ SOLN
25.0000 ug | INTRAMUSCULAR | Status: DC | PRN
  Administered 2020-10-06: 50 ug via INTRAVENOUS

## 2020-10-06 MED ORDER — SODIUM CHLORIDE 0.9 % IR SOLN
Status: DC | PRN
  Administered 2020-10-06: 30000 mL

## 2020-10-06 MED ORDER — SODIUM CHLORIDE 0.9 % IV SOLN: INTRAVENOUS | Status: AC

## 2020-10-06 MED ORDER — FENTANYL CITRATE (PF) 100 MCG/2ML IJ SOLN: 100.0000 ug | Freq: Once | INTRAMUSCULAR | Status: AC

## 2020-10-06 MED ORDER — LACTATED RINGERS IV SOLN: INTRAVENOUS | Status: DC

## 2020-10-06 MED ORDER — PHENYLEPHRINE 40 MCG/ML (10ML) SYRINGE FOR IV PUSH (FOR BLOOD PRESSURE SUPPORT)
PREFILLED_SYRINGE | INTRAVENOUS | Status: DC | PRN
  Administered 2020-10-06: 80 ug via INTRAVENOUS
  Administered 2020-10-06: 120 ug via INTRAVENOUS
  Administered 2020-10-06: 200 ug via INTRAVENOUS

## 2020-10-06 MED ORDER — SODIUM CHLORIDE 0.9 % IV SOLN
2.0000 g | Freq: Three times a day (TID) | INTRAVENOUS | Status: DC
  Administered 2020-10-06 – 2020-10-10 (×12): 2 g via INTRAVENOUS
  Filled 2020-10-06: qty 2
  Filled 2020-10-06: qty 0.07
  Filled 2020-10-06 (×5): qty 2
  Filled 2020-10-06: qty 0.07
  Filled 2020-10-06: qty 2
  Filled 2020-10-06: qty 0.07
  Filled 2020-10-06: qty 2
  Filled 2020-10-06: qty 0.07
  Filled 2020-10-06: qty 2

## 2020-10-06 MED ORDER — OXYCODONE HCL 5 MG/5ML PO SOLN: 5.0000 mg | Freq: Once | ORAL | Status: DC | PRN

## 2020-10-06 MED ORDER — ONDANSETRON HCL 4 MG/2ML IJ SOLN
INTRAMUSCULAR | Status: AC
  Filled 2020-10-06: qty 2

## 2020-10-06 MED ORDER — PHENYLEPHRINE 40 MCG/ML (10ML) SYRINGE FOR IV PUSH (FOR BLOOD PRESSURE SUPPORT)
PREFILLED_SYRINGE | INTRAVENOUS | Status: AC
  Filled 2020-10-06: qty 10

## 2020-10-06 MED ORDER — EPHEDRINE 5 MG/ML INJ
INTRAVENOUS | Status: AC
  Filled 2020-10-06: qty 10

## 2020-10-06 MED ORDER — PROPOFOL 10 MG/ML IV BOLUS
INTRAVENOUS | Status: DC | PRN
  Administered 2020-10-06: 150 mg via INTRAVENOUS

## 2020-10-06 MED ORDER — FENTANYL CITRATE (PF) 100 MCG/2ML IJ SOLN
INTRAMUSCULAR | Status: AC
  Filled 2020-10-06: qty 2

## 2020-10-06 MED ORDER — ACETAMINOPHEN 160 MG/5ML PO SOLN: 325.0000 mg | ORAL | Status: DC | PRN

## 2020-10-06 MED ORDER — CHLORHEXIDINE GLUCONATE CLOTH 2 % EX PADS
6.0000 | MEDICATED_PAD | Freq: Once | CUTANEOUS | Status: AC
  Administered 2020-10-06: 6 via TOPICAL

## 2020-10-06 MED ORDER — FENTANYL CITRATE (PF) 100 MCG/2ML IJ SOLN
INTRAMUSCULAR | Status: DC | PRN
  Administered 2020-10-06: 50 ug via INTRAVENOUS
  Administered 2020-10-06 (×5): 25 ug via INTRAVENOUS
  Administered 2020-10-06: 50 ug via INTRAVENOUS
  Administered 2020-10-06: 25 ug via INTRAVENOUS

## 2020-10-06 MED ORDER — FENTANYL CITRATE (PF) 100 MCG/2ML IJ SOLN
INTRAMUSCULAR | Status: AC
  Administered 2020-10-06: 50 ug via INTRAVENOUS
  Filled 2020-10-06: qty 2

## 2020-10-06 MED ORDER — EPHEDRINE SULFATE-NACL 50-0.9 MG/10ML-% IV SOSY
PREFILLED_SYRINGE | INTRAVENOUS | Status: DC | PRN
  Administered 2020-10-06: 15 mg via INTRAVENOUS

## 2020-10-06 MED ORDER — LIDOCAINE 2% (20 MG/ML) 5 ML SYRINGE
INTRAMUSCULAR | Status: DC | PRN
  Administered 2020-10-06: 40 mg via INTRAVENOUS

## 2020-10-06 MED ORDER — FENTANYL CITRATE (PF) 100 MCG/2ML IJ SOLN
INTRAMUSCULAR | Status: AC
  Administered 2020-10-06: 100 ug via INTRAVENOUS
  Filled 2020-10-06: qty 2

## 2020-10-06 MED ORDER — ONDANSETRON HCL 4 MG/2ML IJ SOLN
INTRAMUSCULAR | Status: DC | PRN
  Administered 2020-10-06: 4 mg via INTRAVENOUS

## 2020-10-06 MED ORDER — ONDANSETRON HCL 4 MG/2ML IJ SOLN: 4.0000 mg | Freq: Once | INTRAMUSCULAR | Status: DC | PRN

## 2020-10-06 MED ORDER — SODIUM CHLORIDE 0.9 % IV SOLN
1.0000 g | Freq: Once | INTRAVENOUS | Status: AC
  Administered 2020-10-06: 1 g via INTRAVENOUS
  Filled 2020-10-06: qty 1

## 2020-10-06 MED ORDER — OXYCODONE HCL 5 MG PO TABS: 5.0000 mg | ORAL_TABLET | Freq: Once | ORAL | Status: DC | PRN

## 2020-10-06 MED ORDER — ACETAMINOPHEN 325 MG PO TABS: 325.0000 mg | ORAL_TABLET | ORAL | Status: DC | PRN

## 2020-10-06 MED ORDER — MEPERIDINE HCL 50 MG/ML IJ SOLN: 6.2500 mg | INTRAMUSCULAR | Status: DC | PRN

## 2020-10-06 SURGICAL SUPPLY — 30 items
BAG URINE DRAIN 2000ML AR STRL (UROLOGICAL SUPPLIES) IMPLANT
BAG URO CATCHER STRL LF (MISCELLANEOUS) ×4 IMPLANT
BASKET STONE NCOMPASS (UROLOGICAL SUPPLIES) IMPLANT
CATH FOLEY 3WAY 30CC 22FR (CATHETERS) IMPLANT
CATH URET 5FR 28IN OPEN ENDED (CATHETERS) IMPLANT
CATH URET DUAL LUMEN 6-10FR 50 (CATHETERS) IMPLANT
CLOTH BEACON ORANGE TIMEOUT ST (SAFETY) ×4 IMPLANT
EXTRACTOR STONE NITINOL NGAGE (UROLOGICAL SUPPLIES) IMPLANT
GLOVE SURG SS PI 8.0 STRL IVOR (GLOVE) ×4 IMPLANT
GOWN STRL REUS W/TWL XL LVL3 (GOWN DISPOSABLE) ×4 IMPLANT
GUIDEWIRE STR DUAL SENSOR (WIRE) ×4 IMPLANT
HOLDER FOLEY CATH W/STRAP (MISCELLANEOUS) IMPLANT
IV NS IRRIG 3000ML ARTHROMATIC (IV SOLUTION) ×4 IMPLANT
KIT TURNOVER KIT A (KITS) IMPLANT
LASER FIB FLEXIVA PULSE ID 365 (Laser) IMPLANT
LASER FIB FLEXIVA PULSE ID 550 (Laser) IMPLANT
LASER FIB FLEXIVA PULSE ID 910 (Laser) IMPLANT
LOOP CUT BIPOLAR 24F LRG (ELECTROSURGICAL) ×4 IMPLANT
MANIFOLD NEPTUNE II (INSTRUMENTS) ×4 IMPLANT
PACK CYSTO (CUSTOM PROCEDURE TRAY) ×4 IMPLANT
SHEATH URETERAL 12FRX35CM (MISCELLANEOUS) IMPLANT
SUT ETHILON 3 0 PS 1 (SUTURE) IMPLANT
SYR 30ML LL (SYRINGE) IMPLANT
SYR TOOMEY IRRIG 70ML (MISCELLANEOUS)
SYRINGE TOOMEY IRRIG 70ML (MISCELLANEOUS) IMPLANT
TRACTIP FLEXIVA PULS ID 200XHI (Laser) IMPLANT
TRACTIP FLEXIVA PULSE ID 200 (Laser)
TUBING CONNECTING 10 (TUBING) ×3 IMPLANT
TUBING CONNECTING 10' (TUBING) ×1
TUBING UROLOGY SET (TUBING) ×4 IMPLANT

## 2020-10-06 NOTE — Op Note (Signed)
Procedure: Transurethral resection of a large bladder tumor of the dome and posterior wall.  Preop diagnosis: 1. Right ureteral stone. 2. Bladder mass.  Postop diagnosis: 1. Right ureteral stone which is not evaluated. 2. 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  Surgeon: Dr. Irine Seal.  Anesthesia: General.  Specimen: Superficial and deep bladder tumor chips.  Drains: 24 French Foley catheter.  EBL: 10 mL.  Complications: None.  Indications: The patient is a 64 year old male who was found on recent CT to have a large mass of the dome of the bladder versus calcified clot or stool from a possible fistula. He also had a 3 mm right ureteral stone that was nonobstructing on his prior CT scan. The initial plan was to perform cystoscopy with evaluation of the bladder mass and possible ureteroscopy for the stone.  Procedure: He had been on Rocephin preoperatively and his cultures have grown multiple species. He was given additional dose of Rocephin in the operating room. A general anesthetic was induced. He was placed in the lithotomy position and fitted with PAS hose. His perineum and genitalia were prepped with Betadine solution and he was draped in usual sterile fashion.  Cystoscopy was performed using the 23 [place] scope and the 30 degree lens. Examination revealed a large amount of necrotic foul-smelling material within the bladder that I initially thought was just old clot. A large portion of this was irrigated out and he was found to have what appeared to be a very large necrotic bladder tumor involving the posterior wall dome and anterior wall of the bladder. The ureteral orifices were not clearly seen because of the associated inflammatory changes of the mucosa. I was unable to perform a retrograde pyelogram or consider ureteroscopy.  The cystoscope was removed and a 26 French continuous-flow resectoscope sheath was inserted. This was  fitted with an Beatrix Fetters handle and a bipolar loop and saline was used as the irrigant. I initially resected a large amount of necrotic material which had a very feculent odor. Eventually I was able to resect down to more viable tumor and nonnecrotic tissue. The diameter of the tumor was greater than 5 cm and extended from the mid posterior wall to the mid anterior wall and was fairly circular in diameter. It had a solid, sessile appearance and the more viable portions. Once I debulked the necrotic material with the exception of some that was anterior and quite deep I collected that specimen and then did additional resection in the more viable portion of the tumor to try to get tissue for pathology. Once this had been performed final inspection for hemostasis and retrieval of chips was performed. Once I felt he was free of resected material and had adequate hemostasis the scope was removed and a 24 [place] Foley catheter was inserted. The balloon was filled with 30 mL of sterile fluid. The catheter was placed to straight drainage. He was taken down from the lithotomy position, his anesthetic was reversed and he was moved recovery room in stable condition. There were no complications.

## 2020-10-06 NOTE — Progress Notes (Signed)
Pharmacy Antibiotic Note  Jamie Wilson is a 64 y.o. male with chronic urinary retention, bladder tumor and right ureteral stone who underwent transurethral resection of mass on 10/06/20.  Pharmacy has been consulted to change abx from ceftriaxone to cefepime for broader urologic coverage.  Plan: - cefepime 2gm q8h  ____________________________  Height: 6\' 1"  (185.4 cm) Weight: 56.7 kg (125 lb) IBW/kg (Calculated) : 79.9  Temp (24hrs), Avg:98.2 F (36.8 C), Min:97.7 F (36.5 C), Max:99.6 F (37.6 C)  Recent Labs  Lab 10/02/20 0119 10/02/20 0227 10/02/20 0427 10/02/20 0825 10/03/20 0515 10/04/20 0606 10/05/20 0739 10/06/20 0509  WBC 19.7*  --   --   --  14.2*  --  16.7* 16.9*  CREATININE 1.27*  --   --  0.88 1.05 0.83 0.78 0.86  LATICACIDVEN  --  1.2 1.9  --   --   --   --   --     Estimated Creatinine Clearance: 68.7 mL/min (by C-G formula based on SCr of 0.86 mg/dL).    Allergies  Allergen Reactions  . Silvadene [Silver Sulfadiazine] Rash      Thank you for allowing pharmacy to be a part of this patient's care.  Lynelle Doctor 10/06/2020 6:38 PM

## 2020-10-06 NOTE — Interval H&P Note (Signed)
History and Physical Interval Note:  Urine remains turbid and he still has some pain.   10/06/2020 1:44 PM  Jamie Wilson  has presented today for surgery, with the diagnosis of RIGHT  URETERAL STONE BLADDER MASS.  The various methods of treatment have been discussed with the patient and family. After consideration of risks, benefits and other options for treatment, the patient has consented to  Procedure(s): CYSTO RIGHT RETROGRADE POSSIBLE URETEROSCOPY HOLMIUM LASER (Right) POSSIBLE TRANSURETHRAL RESECTION OF BLADDER TUMOR (TURBT) (N/A) as a surgical intervention.  The patient's history has been reviewed, patient examined, no change in status, stable for surgery.  I have reviewed the patient's chart and labs.  Questions were answered to the patient's satisfaction.     Jamie Wilson

## 2020-10-06 NOTE — Anesthesia Preprocedure Evaluation (Addendum)
Anesthesia Evaluation  Patient identified by MRN, date of birth, ID band Patient awake    Reviewed: Allergy & Precautions, H&P , NPO status , Patient's Chart, lab work & pertinent test results, reviewed documented beta blocker date and time   Airway Mallampati: I  TM Distance: >3 FB Neck ROM: full    Dental no notable dental hx. (+) Edentulous Upper, Poor Dentition, Chipped, Missing,    Pulmonary Current Smoker and Patient abstained from smoking.,    Pulmonary exam normal breath sounds clear to auscultation       Cardiovascular Exercise Tolerance: Good negative cardio ROS   Rhythm:regular Rate:Normal  ECHO 10/21 Left Ventricle: Left ventricular ejection fraction, by estimation, is 60 to 65%. The left ventricle has normal function. The left ventricle has no regional wall motion abnormalities. The left ventricular internal cavity size was normal in size. There is no left ventricular hypertrophy. Left ventricular diastolic parameters  were normal.    Neuro/Psych negative neurological ROS  negative psych ROS   GI/Hepatic Neg liver ROS,  history of bowel obstruction s/p partial bowel resection and colostomy   Endo/Other  negative endocrine ROS  Renal/GU CRFRenal disease   chronic recurrent urinary obstruction and need for self-catheterization    Musculoskeletal  (+) Arthritis , Osteoarthritis,    Abdominal   Peds  Hematology  (+) Blood dyscrasia (Hgb 10.3), anemia ,   Anesthesia Other Findings Right ureteral stone, bladder mass. CT c/f malignancy with liver mets  Reproductive/Obstetrics negative OB ROS                          Anesthesia Physical Anesthesia Plan  ASA: III  Anesthesia Plan: General   Post-op Pain Management:    Induction: Intravenous  PONV Risk Score and Plan: 2 and Ondansetron, Dexamethasone and Treatment may vary due to age or medical condition  Airway Management  Planned: LMA  Additional Equipment:   Intra-op Plan:   Post-operative Plan:   Informed Consent: I have reviewed the patients History and Physical, chart, labs and discussed the procedure including the risks, benefits and alternatives for the proposed anesthesia with the patient or authorized representative who has indicated his/her understanding and acceptance.     Dental Advisory Given  Plan Discussed with: CRNA and Anesthesiologist  Anesthesia Plan Comments: ( )       Anesthesia Quick Evaluation

## 2020-10-06 NOTE — Progress Notes (Addendum)
[facility]  PROGRESS NOTE  Jamie Wilson URK:[id number] DOB: 09-20-1956 DOA: 10/02/2020 PCP: Patient, [name] Admit date - 10/02/2020   Admitting Physician Sueanne Margarita, DO  Outpatient Primary MD for the patient is Patient, [name]  LOS - 4 Brief Narrative  Jamie Wilson is a 64 year old male with medical history significant for chronic urinary retention with self-catheterization, history of small bowel obstruction requiring bowel resection and colostomy remotely, with recent outpatient history of hematuria despite several rounds of outpatient antibiotics for presumed UTI who presented with worsening lower abdominal pain and flank pain and was found to have hypercalcemia with calcium of 13.5 on admission, concern for UTI started on IV ceftriaxone and concern for new bladder mass on CT imaging.  Subjective  Still having lower abdominal/bladder pain.  Denies any fevers or chills.  A & P   Bladder mass of uncertain etiology. Found on CT imaging. Concern for malignancy. Still having residual lower belly pain -Urology plans for cystoscopy and possible biopsy/resection of the bladder mass on 12/9 -Pain regimen: IV morphine as needed for severe pain, increase oxycodone 5-10 mg as needed moderate pain, scheduled Tylenol, closely monitor  ADDENDUM: high concern for infected tumor with concern for seeding after urologic procedure in discussion with Dr. Jeffie Pollock.  Will broaden out from ceftriaxone to cefepime  Right ureteral stone, nonobstructing. No current flank pain. -Urology will evaluate in OR on 12/9 -Continue Flomax   Reported ileus on abdominal imaging. Still having liquid (baseline) output from ostomy. Some concern for fistula between bladder and intestinal wall on CT imaging -We will continue to closely monitor -Continue MiraLAX given hypercalcemia can worsen any constipation  Prior to admission hematuria with purulent discharge, concerning for possible UTI. Suspect related to  inflammatory process from bladder mass patient is at risk for infection given chronic indwelling Foley. -Agree with continued IV ceftriaxone for now, until urologic procedure on 12/9   Hypercalcemia, unclear etiology. Differential includes possible malignancy, dehydration/poor mobility. Vitamin D level within normal limits peak of 13 on admission, currently 10.9 status post Ohmeda -Follow-up PTH RP -Continue IV fluids  Hyponatremia, mild, asymptomatic. Looks a bit volume down on exam.  --Continue IVF, monitor BMP  Hypophosphatemia -continue replacement and monitor  Chronic urinary retention for several years. 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 Self caths at home. Likely multifactorial etiology including bladder mass possible UTI, hypercalcemia -Continue indwelling Foley, Flomax, IV ceftriaxone  Leukocytosis, stabel over the past 24 hours. Remains afebrile. Unclear if stress reaction or actual infection -Continue IV ceftriaxone for possible UTI given mass and urine findings -Has no other localizing signs or symptoms of infection, remains afebrile  Normocytic anemia, currently stable with hemoglobin at 9-9.7. No bleeding episodes.  suspect anemia of chronic disease -Monitor CBC  Underweight with BMI less than 18 -Nutrition consulted, assisting with supplementation -Continue multivitamins    Family Communication  : None at bedside  Code Status : Full  Disposition Plan  :  Patient is from home. Anticipated d/c date: 2 to 3 days. Barriers to d/c or necessity for inpatient status: IV ceftriaxone, urologic evaluation of mass Consults  : Urology  Procedures  :    DVT Prophylaxis  :  Lovenox -to be held after midnight prior to procedure  MDM: The below labs and imaging reports were reviewed and summarized above.  Medication management as above.  Lab Results  Component Value Date   PLT 365 10/06/2020    Diet :  Diet Order  Diet NPO time specified  Diet effective  midnight                  Inpatient Medications Scheduled Meds: . [MAR Hold] (feeding supplement) PROSource Plus  30 mL Oral BID BM  . [MAR Hold] acetaminophen  1,000 mg Oral Q8H  . [MAR Hold] B-complex with vitamin C  1 tablet Oral Daily  . [MAR Hold] Chlorhexidine Gluconate Cloth  6 each Topical Daily  . [MAR Hold] feeding supplement  237 mL Oral BID BM  . [MAR Hold] multivitamin with minerals  1 tablet Oral Daily  . [MAR Hold] polyethylene glycol  17 g Oral BID  . [MAR Hold] tamsulosin  0.4 mg Oral Daily  . [MAR Hold] vitamin B-12  1,000 mcg Oral Daily   Continuous Infusions: . sodium chloride 100 mL/hr at 10/06/20 0916  . [MAR Hold] cefTRIAXone (ROCEPHIN)  IV 1 g (10/05/20 2151)  . lactated ringers 50 mL/hr at 10/06/20 1308   PRN Meds:.[MAR Hold] acetaminophen **OR** [MAR Hold] acetaminophen, [MAR Hold] bisacodyl, [MAR Hold]  morphine injection, [MAR Hold] ondansetron **OR** [MAR Hold] ondansetron (ZOFRAN) IV, [MAR Hold] oxyCODONE, sodium chloride irrigation  Antibiotics  :   Anti-infectives (From admission, onward)   Start     Dose/Rate Route Frequency Ordered Stop   10/06/20 1430  cefTRIAXone (ROCEPHIN) 1 g in sodium chloride 0.9 % 100 mL IVPB        1 g 200 mL/hr over 30 Minutes Intravenous  Once 10/06/20 1429 10/06/20 1509   10/02/20 2200  [MAR Hold]  cefTRIAXone (ROCEPHIN) 1 g in sodium chloride 0.9 % 100 mL IVPB        (MAR Hold since Thu 10/06/2020 at 1301.Hold Reason: Transfer to a Procedural area.)   1 g 200 mL/hr over 30 Minutes Intravenous Every 24 hours 10/02/20 0437     10/02/20 0230  cefTRIAXone (ROCEPHIN) 2 g in sodium chloride 0.9 % 100 mL IVPB        2 g 200 mL/hr over 30 Minutes Intravenous  Once 10/02/20 0226 10/02/20 0339       Objective   Vitals:   10/06/20 1323 10/06/20 1350 10/06/20 1355 10/06/20 1606  BP: 102/62   116/73  Pulse: (!) 104 95 99 (!) 116  Resp: 16   19  Temp: 99.6 F (37.6 C)   97.9 F (36.6 C)  TempSrc: Oral     SpO2: 94%  99% 91% 98%  Weight:      Height:        SpO2: 98 %  Wt Readings from Last 3 Encounters:  10/06/20 56.7 kg  08/28/20 61.1 kg  07/20/20 68 kg     Intake/Output Summary (Last 24 hours) at 10/06/2020 1609 Last data filed at 10/06/2020 1608 Gross per 24 hour  Intake 900 ml  Output 2250 ml  Net -1350 ml    Physical Exam:  Thin, frail male, in no distress Ostomy bag in place with liquid stool present, abdomen soft, slightly distended but nontender, diminished bowel sounds Foley catheter in place draining minimal amount of clear yellow urine Regular rate and rhythm, no appreciable murmurs Normal respiratory effort on room air      I have personally reviewed the following:   Data Reviewed:  CBC Recent Labs  Lab 10/02/20 0119 10/03/20 0515 10/05/20 0739 10/06/20 0509  WBC 19.7* 14.2* 16.7* 16.9*  HGB 12.1* 9.0* 9.7* 10.3*  HCT 38.3* 28.7* 31.3* 33.1*  PLT 430* 298 309 365  MCV 89.5  90.5 91.3 91.4  MCH 28.3 28.4 28.3 28.5  MCHC 31.6 31.4 31.0 31.1  RDW 13.9 13.7 13.8 13.9  LYMPHSABS 1.0  --   --   --   MONOABS 1.0  --   --   --   EOSABS 1.3*  --   --   --   BASOSABS 0.1  --   --   --     Chemistries  Recent Labs  Lab 10/02/20 0825 10/03/20 0515 10/04/20 0606 10/05/20 0739 10/06/20 0509  NA 134* 135 137 133* 136  K 3.7 4.0 4.4 3.9 4.1  CL 107 108 105 106 106  CO2 22 21* 25 21* 25  GLUCOSE 87 118* 108* 94 101*  BUN 34* 26* 17 16 15   CREATININE 0.88 1.05 0.83 0.78 0.86  CALCIUM 11.9* 11.0* 10.9* 10.3 10.5*  AST 9* 12* 15 13* 12*  ALT 11 11 15 15 15   ALKPHOS 49 52 55 61 66  BILITOT 0.4 0.3 0.3 0.4 0.2*   ------------------------------------------------------------------------------------------------------------------ No results for input(s): CHOL, HDL, LDLCALC, TRIG, CHOLHDL, LDLDIRECT in the last 72 hours.  Lab Results  Component Value Date   HGBA1C 5.7 (H) 08/19/2020    ------------------------------------------------------------------------------------------------------------------ No results for input(s): TSH, T4TOTAL, T3FREE, THYROIDAB in the last 72 hours.  Invalid input(s): FREET3 ------------------------------------------------------------------------------------------------------------------ No results for input(s): VITAMINB12, FOLATE, FERRITIN, TIBC, IRON, RETICCTPCT in the last 72 hours.  Coagulation profile No results for input(s): INR, PROTIME in the last 168 hours.  No results for input(s): DDIMER in the last 72 hours.  Cardiac Enzymes No results for input(s): CKMB, TROPONINI, MYOGLOBIN in the last 168 hours.  Invalid input(s): CK ------------------------------------------------------------------------------------------------------------------ No results found for: BNP  Micro Results Recent Results (from the past 240 hour(s))  Urine culture     Status: Abnormal   Collection Time: 10/02/20  2:27 AM   Specimen: Urine, Clean Catch  Result Value Ref Range Status   Specimen Description   Final    URINE, CLEAN CATCH Performed at [facility], [place] [address] [place], [place] [address]    Special Requests   Final    NONE Performed at [facility], [place] [address], [place], [place] [address]    Culture MULTIPLE SPECIES PRESENT, SUGGEST RECOLLECTION (A)  Final   Report Status 10/03/2020 FINAL  Final  Resp Panel by RT-PCR (Flu A&B, Covid) Nasopharyngeal Swab     Status: None   Collection Time: 10/02/20  3:03 AM   Specimen: Nasopharyngeal Swab; Nasopharyngeal(NP) swabs in vial transport medium  Result Value Ref Range Status   SARS Coronavirus 2 by RT PCR NEGATIVE NEGATIVE Final    Comment: (NOTE) SARS-CoV-2 target nucleic acids are NOT DETECTED.  The SARS-CoV-2 RNA is generally detectable in upper respiratory specimens during the acute phase of infection. The lowest concentration of SARS-CoV-2  viral copies this assay can detect is 138 copies/mL. A negative result does not preclude SARS-Cov-2 infection and should not be used as the sole basis for treatment or other patient management decisions. A negative result may occur with  improper specimen collection/handling, submission of specimen other than nasopharyngeal swab, presence of viral mutation(s) within the areas targeted by this assay, and inadequate number of viral copies(<138 copies/mL). A negative result must be combined with clinical observations, patient history, and epidemiological information. The expected result is Negative.  Fact Sheet for Patients:  EntrepreneurPulse.com.au  Fact Sheet for Healthcare Providers:  IncredibleEmployment.be  This test is no t yet approved or cleared by  the [employer] and  has been authorized for detection and/or diagnosis of SARS-CoV-2 by FDA under an Emergency Use Authorization (EUA). This EUA will remain  in effect (meaning this test can be used) for the duration of the COVID-19 declaration under Section 564(b)(1) of the Act, 21 U.S.C.section 360bbb-3(b)(1), unless the authorization is terminated  or revoked sooner.       Influenza A by PCR NEGATIVE NEGATIVE Final   Influenza B by PCR NEGATIVE NEGATIVE Final    Comment: (NOTE) The Xpert Xpress SARS-CoV-2/FLU/RSV plus assay is intended as an aid in the diagnosis of influenza from Nasopharyngeal swab specimens and should not be used as a sole basis for treatment. Nasal washings and aspirates are unacceptable for Xpert Xpress SARS-CoV-2/FLU/RSV testing.  Fact Sheet for Patients: EntrepreneurPulse.com.au  Fact Sheet for Healthcare Providers: IncredibleEmployment.be  This test is not yet approved or cleared by the [place] FDA and has been authorized for detection and/or diagnosis of SARS-CoV-2 by FDA under an Emergency Use Authorization  (EUA). This EUA will remain in effect (meaning this test can be used) for the duration of the COVID-19 declaration under Section 564(b)(1) of the Act, 21 U.S.C. section 360bbb-3(b)(1), unless the authorization is terminated or revoked.  Performed at [facility], [place] [address] [place], [place] [address]     Radiology Reports DG Abd 1 View  Result Date: 10/03/2020 CLINICAL DATA:  Right ureteral stone EXAM: ABDOMEN - 1 VIEW COMPARISON:  10/02/2020 FINDINGS: Gaseous distension large and small bowel loops within the abdomen. Moderate volume stool within the right hemicolon. Numerous calcifications project over the right renal shadow. Known proximal right ureteral calculus is not definitively seen. IMPRESSION: 1. Numerous calcifications project over the right renal shadow. 2. Known proximal right ureteral calculus is not definitively seen. 3. Air-filled loops of large and small bowel suggesting ileus. Electronically Signed   By: Davina Poke D.O.   On: 10/03/2020 08:32   CT Renal Stone Study  Result Date: 10/02/2020 CLINICAL DATA:  Bilateral flank pain EXAM: CT ABDOMEN AND PELVIS WITHOUT CONTRAST TECHNIQUE: Multidetector CT imaging of the abdomen and pelvis was performed following the standard protocol without IV contrast. COMPARISON:  None. FINDINGS: Lower chest: The visualized heart size within normal limits. No pericardial fluid/thickening. No hiatal hernia. The visualized portions of the lungs are clear. Hepatobiliary: Multiple low-density lesions are seen throughout the liver parenchyma the largest in the right hepatic dome measures 5.5 cm. No evidence of calcified gallstones or biliary ductal dilatation. Pancreas:  Unremarkable.  No surrounding inflammatory changes. Spleen: Normal in size. Although limited due to the lack of intravenous contrast, normal in appearance. Adrenals/Urinary Tract: Both adrenal glands appear normal there is mild right-sided pelviectasis with  stranding changes. There are multiple coarse calcifications throughout the right kidney the largest measuring 8 mm within the upper pole. Within the proximal right ureter there is a 3 mm calculus present. There is calcifications seen in the lower pole the left kidney. Again noted within the bladder is heterogeneous soft tissue masslike area and wall thickening extending superiorly which appears to have a fistulous connection to the overlying small bowel and colonic loops. There now appears to be small foci of air and debris. Stomach/Bowel: The stomach and small bowel are unremarkable. There is scattered colonic diverticula with a moderate amount of colonic stool present. A left lower quadrant ostomy is present. Vascular/Lymphatic: There are no enlarged abdominal or pelvic lymph nodes. Scattered aortic atherosclerotic calcifications are seen without aneurysmal dilatation.  Reproductive: The patient is status post hysterectomy. No adnexal masses or collections seen. Other: No evidence of abdominal wall mass or hernia. Musculoskeletal: No acute or significant osseous findings. 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 Mild right pelviectasis with a proximal 3 mm ureteral calculi. Multiple bilateral non-obstructing renal calculi. Aortic Atherosclerosis (ICD10-I70.0). Electronically Signed   By: Prudencio Pair M.D.   On: 10/02/2020 03:46     Time Spent in minutes  30     Desiree Hane M.D on 10/06/2020 at 4:09 PM  To page go to [email or web address] - password [email or web address]

## 2020-10-06 NOTE — Transfer of Care (Signed)
Immediate Anesthesia Transfer of Care Note  Patient: Jamie Wilson  Procedure(s) Performed: CYSTO (Right Bladder) POSSIBLE TRANSURETHRAL RESECTION OF BLADDER TUMOR (TURBT) (N/A Bladder)  Patient Location: PACU  Anesthesia Type:General  Level of Consciousness: drowsy  Airway & Oxygen Therapy: Patient Spontanous Breathing and Patient connected to face mask oxygen  Post-op Assessment: Report given to RN and Post -op Vital signs reviewed and stable  Post vital signs: Reviewed and stable  Last Vitals:  Vitals Value Taken Time  BP 116/73 10/06/20 1606  Temp    Pulse 118 10/06/20 1608  Resp 19 10/06/20 1608  SpO2 99 % 10/06/20 1608  Vitals shown include unvalidated device data.  Last Pain:  Vitals:   10/06/20 1323  TempSrc: Oral  PainSc:       Patients Stated Pain Goal: 4 (65/46/50 3546)  Complications: No complications documented.

## 2020-10-06 NOTE — Anesthesia Procedure Notes (Signed)
Procedure Name: LMA Insertion Date/Time: 10/06/2020 2:16 PM Performed by: [place] Gregery Walberg C, CRNA Pre-anesthesia Checklist: Patient identified, Emergency Drugs available, Suction available and Patient being monitored Patient Re-evaluated:Patient Re-evaluated prior to induction Oxygen Delivery Method: Circle system utilized Preoxygenation: Pre-oxygenation with 100% oxygen Induction Type: IV induction Ventilation: Mask ventilation without difficulty LMA: LMA inserted LMA Size: 4.0 Number of attempts: 1 Airway Equipment and Method: Bite block Placement Confirmation: positive ETCO2 Tube secured with: Tape Dental Injury: Teeth and Oropharynx as per pre-operative assessment

## 2020-10-07 ENCOUNTER — Encounter (HOSPITAL_COMMUNITY): Payer: Self-pay | Admitting: Urology

## 2020-10-07 DIAGNOSIS — I959 Hypotension, unspecified: Secondary | ICD-10-CM

## 2020-10-07 LAB — COMPREHENSIVE METABOLIC PANEL
ALT: 16 U/L (ref 0–44)
AST: 14 U/L — ABNORMAL LOW (ref 15–41)
Albumin: 2 g/dL — ABNORMAL LOW (ref 3.5–5.0)
Alkaline Phosphatase: 57 U/L (ref 38–126)
Anion gap: 7 (ref 5–15)
BUN: 13 mg/dL (ref 8–23)
CO2: 23 mmol/L (ref 22–32)
Calcium: 8.8 mg/dL — ABNORMAL LOW (ref 8.9–10.3)
Chloride: 107 mmol/L (ref 98–111)
Creatinine, Ser: 0.89 mg/dL (ref 0.61–1.24)
GFR, Estimated: >60 mL/min (ref >60)
Glucose, Bld: 97 mg/dL (ref 70–99)
Potassium: 4.2 mmol/L (ref 3.5–5.1)
Sodium: 137 mmol/L (ref 135–145)
Total Bilirubin: 0.3 mg/dL (ref 0.3–1.2)
Total Protein: 5.6 g/dL — ABNORMAL LOW (ref 6.5–8.1)

## 2020-10-07 LAB — CBC
HCT: 29.7 % — ABNORMAL LOW (ref 39.0–52.0)
Hemoglobin: 9.2 g/dL — ABNORMAL LOW (ref 13.0–17.0)
MCH: 28.6 pg (ref 26.0–34.0)
MCHC: 31 g/dL (ref 30.0–36.0)
MCV: 92.2 fL (ref 80.0–100.0)
Platelets: 340 10*3/uL (ref 150–400)
RBC: 3.22 MIL/uL — ABNORMAL LOW (ref 4.22–5.81)
RDW: 14.1 % (ref 11.5–15.5)
WBC: 15.7 10*3/uL — ABNORMAL HIGH (ref 4.0–10.5)
nRBC: 0 % (ref 0.0–0.2)

## 2020-10-07 LAB — GLUCOSE, CAPILLARY: Glucose-Capillary: 100 mg/dL — ABNORMAL HIGH (ref 70–99)

## 2020-10-07 LAB — LACTIC ACID, PLASMA
Lactic Acid, Venous: 0.9 mmol/L (ref 0.5–1.9)
Lactic Acid, Venous: 1.9 mmol/L (ref 0.5–1.9)
Lactic Acid, Venous: 1.3 mmol/L (ref 0.5–1.9)
Lactic Acid, Venous: 1.3 mmol/L (ref 0.5–1.9)

## 2020-10-07 MED ORDER — SODIUM CHLORIDE 0.9 % IV BOLUS
1000.0000 mL | Freq: Once | INTRAVENOUS | Status: AC
  Administered 2020-10-07: 1000 mL via INTRAVENOUS

## 2020-10-07 MED ORDER — IOHEXOL 9 MG/ML PO SOLN
ORAL | Status: AC
  Administered 2020-10-07: 500 mL via ORAL
  Filled 2020-10-07: qty 1000

## 2020-10-07 MED ORDER — IOHEXOL 9 MG/ML PO SOLN: 500.0000 mL | ORAL | Status: AC

## 2020-10-07 MED ORDER — LACTATED RINGERS IV BOLUS
1000.0000 mL | Freq: Once | INTRAVENOUS | Status: AC
  Administered 2020-10-07: 1000 mL via INTRAVENOUS

## 2020-10-07 MED ORDER — SODIUM CHLORIDE 0.9 % IV SOLN: INTRAVENOUS | Status: DC

## 2020-10-07 NOTE — Progress Notes (Signed)
PT Cancellation Note  Patient Details Name: Jamie Wilson MRN: [id number] DOB: [birth year]   Cancelled Treatment:    Reason Eval/Treat Not Completed: Pain limiting ability to participate;Other (comment). Pt continues to repeatedly refused PT. Encouraged mobility and cautioned regarding the adverse effects of extended bedrest. This is 3rd or 4th consecutive refusal. Pt refuses any attempt to mobilize, sit EOB, transfers, etc.  Explained to pt that he should amb with nursing staff when able. PT signing off, pt in agreement with this plan   [facility] 10/07/2020, 1:18 PM

## 2020-10-07 NOTE — Progress Notes (Signed)
[facility]  PROGRESS NOTE  Camp Gopal DXA:[id number] DOB: [birth year] DOA: 10/02/2020 PCP: Patient, [name] Admit date - 10/02/2020   Admitting Physician Sueanne Margarita, DO  Outpatient Primary MD for the patient is Patient, [name]  LOS - 5 Brief Narrative  Jamie Wilson is a 64 year old male with medical history significant for chronic urinary retention with self-catheterization, history of small bowel obstruction requiring bowel resection and colostomy remotely, with recent outpatient history of hematuria despite several rounds of outpatient antibiotics for presumed UTI who presented with worsening lower abdominal pain and flank pain and was found to have hypercalcemia with calcium of 13.5 on admission, concern for UTI started on IV ceftriaxone and concern for new bladder mass on CT imaging.  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  Patient antibiotics were changed from ceftriaxone to cefepime due to concern for potential seeding with bacteremia given retrograde ureteral irrigation. Subjective  Lower belly pain still present.  Per nursing reporting blood pressure with maps less than 65.  Patient still maintaining normal level of alertness  A & P   Necrotic bladder mass of uncertain etiology. Found on CT renal stone imaging. Concern for malignancy. Still having residual lower belly pain.  Urethral resection concerning for necrotic bladder tumor. -Urology recommends CT abdomen and pelvis with and without contrast to reassess stones, assess for metastasis, -Pain regimen: IV morphine as needed for severe pain, increase oxycodone 5-10 mg as needed moderate pain, scheduled Tylenol, closely monitor  Hypotension.  Could be secondary to recent medications from TURBT; however, given also T-max of 99.1 some concern for potential infection.  Leukocytosis is stable.  Lactic acid within normal limits.  Status post two 1 L boluses  of normal saline seems to be improving now with latest blood pressure 120s over 80s and map over 65 -Continue maintenance fluids, goal MAP greater than 65 -Monitor blood cultures -If worsens will escalate antibiotics to cefepime to Zosyn for better anaerobic and gram-positive coverage  Right ureteral stone, nonobstructing. No current flank pain. 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 Some concern for fistula between bladder and intestinal wall on CT renal stone imaging -Assess with CT abdomen/pelvis imaging -Continue MiraLAX given  Prior to admission hematuria with purulent discharge, concerning for possible UTI. Suspect related to inflammatory process from bladder mass patient is at risk for infection given chronic indwelling Foley. -Agree with continued IV ceftriaxone for now, until urologic procedure on 12/9  Hypercalcemia, unclear etiology, improved. Differential includes possible malignancy, dehydration/poor mobility. Vitamin D level within normal limits. [place] of 13 on admission, currently 8.8 status post Zometa earlier in hospitalization   -Follow-up PTH RP, currently pending -Continue IV fluids  Hyponatremia, mild, asymptomatic, resolved with IVF. -- monitor BMP  Hypophosphatemia -continue replacement and monitor  Chronic urinary retention for several years. 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 Self caths at home. Likely multifactorial etiology including bladder mass possible UTI, hypercalcemia -Continue indwelling Foley, Flomax, IV ceftriaxone  Leukocytosis, stable over the past 24 hours. Remains afebrile. Unclear if stress reaction or actual infection -Continue IV cefepime given concern for necrotic and possible infected bladder mass -Has no other localizing signs or symptoms of infection, remains afebrile  Normocytic anemia, currently stable  with hemoglobin at 9-9.7. No bleeding episodes.  suspect anemia of chronic disease -Monitor CBC  Underweight with BMI less than 18 -  Nutrition consulted, assisting with supplementation -Continue multivitamins    Family Communication  : None at bedside  Code Status : Full  Disposition Plan  :  Patient is from home. Anticipated d/c date: 2 to 3 days. Barriers to d/c or necessity for inpatient status: IV cefepime, monitor blood cultures, CT abdomen and pelvis, close monitoring blood pressure Consults  : Urology  Procedures  :    DVT Prophylaxis  :  Lovenox -to be held after midnight prior to procedure  MDM: The below labs and imaging reports were reviewed and summarized above.  Medication management as above.  Lab Results  Component Value Date   PLT 340 10/07/2020    Diet :  Diet Order            Diet regular Room service appropriate? Yes; Fluid consistency: Thin  Diet effective now                  Inpatient Medications Scheduled Meds: . (feeding supplement) PROSource Plus  30 mL Oral BID BM  . acetaminophen  1,000 mg Oral Q8H  . B-complex with vitamin C  1 tablet Oral Daily  . Chlorhexidine Gluconate Cloth  6 each Topical Daily  . feeding supplement  237 mL Oral BID BM  . multivitamin with minerals  1 tablet Oral Daily  . polyethylene glycol  17 g Oral BID  . tamsulosin  0.4 mg Oral Daily  . vitamin B-12  1,000 mcg Oral Daily   Continuous Infusions: . sodium chloride 100 mL/hr at 10/07/20 1115  . ceFEPime (MAXIPIME) IV 2 g (10/07/20 1114)   PRN Meds:.acetaminophen **OR** acetaminophen, bisacodyl, morphine injection, ondansetron **OR** ondansetron (ZOFRAN) IV, oxyCODONE  Antibiotics  :   Anti-infectives (From admission, onward)   Start     Dose/Rate Route Frequency Ordered Stop   10/06/20 1930  ceFEPIme (MAXIPIME) 2 g in sodium chloride 0.9 % 100 mL IVPB        2 g 200 mL/hr over 30 Minutes Intravenous Every 8 hours 10/06/20 1844     10/06/20 1430   cefTRIAXone (ROCEPHIN) 1 g in sodium chloride 0.9 % 100 mL IVPB        1 g 200 mL/hr over 30 Minutes Intravenous  Once 10/06/20 1429 10/06/20 1509   10/02/20 2200  cefTRIAXone (ROCEPHIN) 1 g in sodium chloride 0.9 % 100 mL IVPB  Status:  Discontinued        1 g 200 mL/hr over 30 Minutes Intravenous Every 24 hours 10/02/20 0437 10/06/20 1820   10/02/20 0230  cefTRIAXone (ROCEPHIN) 2 g in sodium chloride 0.9 % 100 mL IVPB        2 g 200 mL/hr over 30 Minutes Intravenous  Once 10/02/20 0226 10/02/20 0339       Objective   Vitals:   10/07/20 1030 10/07/20 1031 10/07/20 1125 10/07/20 1247  BP: (!) 95/50 (!) 92/46 (!) 106/53 125/60  Pulse:  99  (!) 103  Resp:   19   Temp:  98 F (36.7 C) 99.6 F (37.6 C) 99.9 F (37.7 C)  TempSrc:  Axillary Oral Oral  SpO2:    93%  Weight:      Height:        SpO2: 93 % O2 Flow Rate (L/min): 2 L/min  Wt Readings from Last 3 Encounters:  10/07/20 58.9 kg  08/28/20 61.1 kg  07/20/20 68 kg     Intake/Output Summary (Last 24 hours) at 10/07/2020 1308 Last data filed at 10/07/2020  0700 Gross per 24 hour  Intake 2407.63 ml  Output 1450 ml  Net 957.63 ml    Physical Exam:  Thin, frail male, in no distress Ostomy bag in place with liquid stool present, abdomen soft, slightly distended but nontender, diminished bowel sounds Foley catheter in place draining minimal amount of clear yellow urine Regular rate and rhythm, no appreciable murmurs Normal respiratory effort on room air      I have personally reviewed the following:   Data Reviewed:  CBC Recent Labs  Lab 10/02/20 0119 10/03/20 0515 10/05/20 0739 10/06/20 0509 10/07/20 0600  WBC 19.7* 14.2* 16.7* 16.9* 15.7*  HGB 12.1* 9.0* 9.7* 10.3* 9.2*  HCT 38.3* 28.7* 31.3* 33.1* 29.7*  PLT 430* 298 309 365 340  MCV 89.5 90.5 91.3 91.4 92.2  MCH 28.3 28.4 28.3 28.5 28.6  MCHC 31.6 31.4 31.0 31.1 31.0  RDW 13.9 13.7 13.8 13.9 14.1  LYMPHSABS 1.0  --   --   --   --   MONOABS  1.0  --   --   --   --   EOSABS 1.3*  --   --   --   --   BASOSABS 0.1  --   --   --   --     Chemistries  Recent Labs  Lab 10/03/20 0515 10/04/20 0606 10/05/20 0739 10/06/20 0509 10/07/20 0600  NA 135 137 133* 136 137  K 4.0 4.4 3.9 4.1 4.2  CL 108 105 106 106 107  CO2 21* 25 21* 25 23  GLUCOSE 118* 108* 94 101* 97  BUN 26* 17 16 15 13   CREATININE 1.05 0.83 0.78 0.86 0.89  CALCIUM 11.0* 10.9* 10.3 10.5* 8.8*  AST 12* 15 13* 12* 14*  ALT 11 15 15 15 16   ALKPHOS 52 55 61 66 57  BILITOT 0.3 0.3 0.4 0.2* 0.3   ------------------------------------------------------------------------------------------------------------------ No results for input(s): CHOL, HDL, LDLCALC, TRIG, CHOLHDL, LDLDIRECT in the last 72 hours.  Lab Results  Component Value Date   HGBA1C 5.7 (H) 08/19/2020   ------------------------------------------------------------------------------------------------------------------ No results for input(s): TSH, T4TOTAL, T3FREE, THYROIDAB in the last 72 hours.  Invalid input(s): FREET3 ------------------------------------------------------------------------------------------------------------------ No results for input(s): VITAMINB12, FOLATE, FERRITIN, TIBC, IRON, RETICCTPCT in the last 72 hours.  Coagulation profile No results for input(s): INR, PROTIME in the last 168 hours.  No results for input(s): DDIMER in the last 72 hours.  Cardiac Enzymes No results for input(s): CKMB, TROPONINI, MYOGLOBIN in the last 168 hours.  Invalid input(s): CK ------------------------------------------------------------------------------------------------------------------ No results found for: BNP  Micro Results Recent Results (from the past 240 hour(s))  Urine culture     Status: Abnormal   Collection Time: 10/02/20  2:27 AM   Specimen: Urine, Clean Catch  Result Value Ref Range Status   Specimen Description   Final    URINE, CLEAN CATCH Performed at [facility], [place] [address], [place], [place] [address]    Special Requests   Final    NONE Performed at [facility], [place] [address] [place], [place] [address]    Culture MULTIPLE SPECIES PRESENT, SUGGEST RECOLLECTION (A)  Final   Report Status 10/03/2020 FINAL  Final  Resp Panel by RT-PCR (Flu A&B, Covid) Nasopharyngeal Swab     Status: None   Collection Time: 10/02/20  3:03 AM   Specimen: Nasopharyngeal Swab; Nasopharyngeal(NP) swabs in vial transport medium  Result Value Ref Range Status   SARS Coronavirus 2 by RT PCR NEGATIVE NEGATIVE  Final    Comment: (NOTE) SARS-CoV-2 target nucleic acids are NOT DETECTED.  The SARS-CoV-2 RNA is generally detectable in upper respiratory specimens during the acute phase of infection. The lowest concentration of SARS-CoV-2 viral copies this assay can detect is 138 copies/mL. A negative result does not preclude SARS-Cov-2 infection and should not be used as the sole basis for treatment or other patient management decisions. A negative result may occur with  improper specimen collection/handling, submission of specimen other than nasopharyngeal swab, presence of viral mutation(s) within the areas targeted by this assay, and inadequate number of viral copies(<138 copies/mL). A negative result must be combined with clinical observations, patient history, and epidemiological information. The expected result is Negative.  Fact Sheet for Patients:  EntrepreneurPulse.com.au  Fact Sheet for Healthcare Providers:  IncredibleEmployment.be  This test is no t yet approved or cleared by the [place] FDA and  has been authorized for detection and/or diagnosis of SARS-CoV-2 by FDA under an Emergency Use Authorization (EUA). This EUA will remain  in effect (meaning this test can be used) for the duration of the COVID-19 declaration under Section 564(b)(1) of the Act, 21 U.S.C.section  360bbb-3(b)(1), unless the authorization is terminated  or revoked sooner.       Influenza A by PCR NEGATIVE NEGATIVE Final   Influenza B by PCR NEGATIVE NEGATIVE Final    Comment: (NOTE) The Xpert Xpress SARS-CoV-2/FLU/RSV plus assay is intended as an aid in the diagnosis of influenza from Nasopharyngeal swab specimens and should not be used as a sole basis for treatment. Nasal washings and aspirates are unacceptable for Xpert Xpress SARS-CoV-2/FLU/RSV testing.  Fact Sheet for Patients: EntrepreneurPulse.com.au  Fact Sheet for Healthcare Providers: IncredibleEmployment.be  This test is not yet approved or cleared by the [place] FDA and has been authorized for detection and/or diagnosis of SARS-CoV-2 by FDA under an Emergency Use Authorization (EUA). This EUA will remain in effect (meaning this test can be used) for the duration of the COVID-19 declaration under Section 564(b)(1) of the Act, 21 U.S.C. section 360bbb-3(b)(1), unless the authorization is terminated or revoked.  Performed at [facility], [place] [address] [place], [place] [address]     Radiology Reports DG Abd 1 View  Result Date: 10/03/2020 CLINICAL DATA:  Right ureteral stone EXAM: ABDOMEN - 1 VIEW COMPARISON:  10/02/2020 FINDINGS: Gaseous distension large and small bowel loops within the abdomen. Moderate volume stool within the right hemicolon. Numerous calcifications project over the right renal shadow. Known proximal right ureteral calculus is not definitively seen. IMPRESSION: 1. Numerous calcifications project over the right renal shadow. 2. Known proximal right ureteral calculus is not definitively seen. 3. Air-filled loops of large and small bowel suggesting ileus. Electronically Signed   By: Davina Poke D.O.   On: 10/03/2020 08:32   CT Renal Stone Study  Result Date: 10/02/2020 CLINICAL DATA:  Bilateral flank pain EXAM: CT ABDOMEN AND  PELVIS WITHOUT CONTRAST TECHNIQUE: Multidetector CT imaging of the abdomen and pelvis was performed following the standard protocol without IV contrast. COMPARISON:  None. FINDINGS: Lower chest: The visualized heart size within normal limits. No pericardial fluid/thickening. No hiatal hernia. The visualized portions of the lungs are clear. Hepatobiliary: Multiple low-density lesions are seen throughout the liver parenchyma the largest in the right hepatic dome measures 5.5 cm. No evidence of calcified gallstones or biliary ductal dilatation. Pancreas:  Unremarkable.  No surrounding inflammatory changes. Spleen: Normal in size. Although limited due to the lack of intravenous contrast,  normal in appearance. Adrenals/Urinary Tract: Both adrenal glands appear normal there is mild right-sided pelviectasis with stranding changes. There are multiple coarse calcifications throughout the right kidney the largest measuring 8 mm within the upper pole. Within the proximal right ureter there is a 3 mm calculus present. There is calcifications seen in the lower pole the left kidney. Again noted within the bladder is heterogeneous soft tissue masslike area and wall thickening extending superiorly which appears to have a fistulous connection to the overlying small bowel and colonic loops. There now appears to be small foci of air and debris. Stomach/Bowel: The stomach and small bowel are unremarkable. There is scattered colonic diverticula with a moderate amount of colonic stool present. A left lower quadrant ostomy is present. Vascular/Lymphatic: There are no enlarged abdominal or pelvic lymph nodes. Scattered aortic atherosclerotic calcifications are seen without aneurysmal dilatation. Reproductive: The patient is status post hysterectomy. No adnexal masses or collections seen. Other: No evidence of abdominal wall mass or hernia. Musculoskeletal: No acute or significant osseous findings. IMPRESSION: Heterogeneous soft tissue mass  extending from the bladder superiorly which appears to likely have a fistulous connection to the overlying small bowel and colonic loops, concerning for bladder neoplasm. Mild right pelviectasis with a proximal 3 mm ureteral calculi. Multiple bilateral non-obstructing renal calculi. Aortic Atherosclerosis (ICD10-I70.0). Electronically Signed   By: Prudencio Pair M.D.   On: 10/02/2020 03:46     Time Spent in minutes  30     Desiree Hane M.D on 10/07/2020 at 1:08 PM  To page go to [email or web address] - password [facility]

## 2020-10-07 NOTE — Progress Notes (Signed)
1 Day Post-Op  Subjective: Jamie Wilson appears to be feeling better but he has some residual pain.  His urine is much clearing with minimal debris and hematuria.  He has no right flank pain.  He has had hypotension but no significant fever.  His WBC is slightly lower.   At surgery, I didn't address the ureteral stone because of the large necrotic bladder tumor and concern for causing bacteremia with retrograde ureteral irrigation.  Path is pending.   ROS:  Review of Systems  Constitutional: Negative for fever.       Sweats  Respiratory: Negative for shortness of breath.   Cardiovascular: Negative for chest pain.  Gastrointestinal: Positive for abdominal pain.    Anti-infectives: Anti-infectives (From admission, onward)   Start     Dose/Rate Route Frequency Ordered Stop   10/06/20 1930  ceFEPIme (MAXIPIME) 2 g in sodium chloride 0.9 % 100 mL IVPB        2 g 200 mL/hr over 30 Minutes Intravenous Every 8 hours 10/06/20 1844     10/06/20 1430  cefTRIAXone (ROCEPHIN) 1 g in sodium chloride 0.9 % 100 mL IVPB        1 g 200 mL/hr over 30 Minutes Intravenous  Once 10/06/20 1429 10/06/20 1509   10/02/20 2200  cefTRIAXone (ROCEPHIN) 1 g in sodium chloride 0.9 % 100 mL IVPB  Status:  Discontinued        1 g 200 mL/hr over 30 Minutes Intravenous Every 24 hours 10/02/20 0437 10/06/20 1820   10/02/20 0230  cefTRIAXone (ROCEPHIN) 2 g in sodium chloride 0.9 % 100 mL IVPB        2 g 200 mL/hr over 30 Minutes Intravenous  Once 10/02/20 0226 10/02/20 0339      Current Facility-Administered Medications  Medication Dose Route Frequency Provider Last Rate Last Admin  . (feeding supplement) PROSource Plus liquid 30 mL  30 mL Oral BID BM Irine Seal, MD   30 mL at 10/06/20 2219  . 0.9 %  sodium chloride infusion   Intravenous Continuous Irine Seal, MD 100 mL/hr at 10/07/20 0230 New Bag at 10/07/20 0230  . acetaminophen (TYLENOL) tablet 650 mg  650 mg Oral Q6H PRN Irine Seal, MD   650 mg at 10/05/20 1745    Or  . acetaminophen (TYLENOL) suppository 650 mg  650 mg Rectal Q6H PRN Irine Seal, MD      . acetaminophen (TYLENOL) tablet 1,000 mg  1,000 mg Oral Q8H Irine Seal, MD   1,000 mg at 10/07/20 0548  . B-complex with vitamin C tablet 1 tablet  1 tablet Oral Daily Irine Seal, MD   1 tablet at 10/05/20 0905  . bisacodyl (DULCOLAX) EC tablet 5 mg  5 mg Oral Daily PRN Irine Seal, MD      . ceFEPIme (MAXIPIME) 2 g in sodium chloride 0.9 % 100 mL IVPB  2 g Intravenous Q8H Pham, Anh P, RPH 200 mL/hr at 10/07/20 0240 2 g at 10/07/20 0240  . Chlorhexidine Gluconate Cloth 2 % PADS 6 each  6 each Topical Daily Irine Seal, MD   6 each at 10/06/20 1100  . feeding supplement (ENSURE ENLIVE / ENSURE PLUS) liquid 237 mL  237 mL Oral BID BM Irine Seal, MD   237 mL at 10/05/20 0906  . morphine 2 MG/ML injection 2 mg  2 mg Intravenous Q4H PRN Irine Seal, MD   2 mg at 10/07/20 0240  . multivitamin with minerals tablet 1 tablet  1  tablet Oral Daily Irine Seal, MD   1 tablet at 10/05/20 [phone]  . ondansetron (ZOFRAN) tablet 4 mg  4 mg Oral Q6H PRN Irine Seal, MD       Or  . ondansetron [facility]) injection 4 mg  4 mg Intravenous Q6H PRN Irine Seal, MD      . oxyCODONE (Oxy IR/ROXICODONE) immediate release tablet 5-10 mg  5-10 mg Oral Q4H PRN Irine Seal, MD   10 mg at 10/06/20 0504  . polyethylene glycol (MIRALAX / GLYCOLAX) packet 17 g  17 g Oral BID Irine Seal, MD   17 g at 10/04/20 0936  . tamsulosin (FLOMAX) capsule 0.4 mg  0.4 mg Oral Daily Irine Seal, MD   0.4 mg at 10/05/20 0905  . vitamin B-12 (CYANOCOBALAMIN) tablet 1,000 mcg  1,000 mcg Oral Daily Irine Seal, MD   1,000 mcg at 10/05/20 1916     Objective: Vital signs in last 24 hours: Temp:  [97.8 F (36.6 C)-99.6 F (37.6 C)] 99.1 F (37.3 C) (12/10 0737) Pulse Rate:  [94-118] 98 (12/10 0737) Resp:  [14-29] 27 (12/10 0737) BP: (82-137)/(50-84) 82/50 (12/10 0741) SpO2:  [91 %-99 %] 93 % (12/10 0737) Weight:  [56.7 kg-58.9 kg] 58.9 kg (12/10  0451)  Intake/Output from previous day: 12/09 0701 - 12/10 0700 In: 2407.6 [I.V.:2107.6; IV Piggyback:300] Out: 2050 [Urine:2050] Intake/Output this shift: No intake/output data recorded.   Physical Exam Vitals reviewed.  Constitutional:      Appearance: Normal appearance.  Genitourinary:    Comments: Urine fairly clear with minimal hematuria or debris.  Neurological:     Mental Status: He is alert.     Lab Results:  Recent Labs    10/06/20 0509 10/07/20 0600  WBC 16.9* 15.7*  HGB 10.3* 9.2*  HCT 33.1* 29.7*  PLT 365 340   BMET Recent Labs    10/06/20 0509 10/07/20 0600  NA 136 137  K 4.1 4.2  CL 106 107  CO2 25 23  GLUCOSE 101* 97  BUN 15 13  CREATININE 0.86 0.89  CALCIUM 10.5* 8.8*   PT/INR No results for input(s): LABPROT, INR in the last 72 hours. ABG No results for input(s): PHART, HCO3 in the last 72 hours.  Invalid input(s): PCO2, PO2  Studies/Results: No results found.   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  He is doing better s/p TURBT but will probably need to be considered for cystectomy.   Will need repeat CT for staging and a CXR depending on the path.    Right ureteral and renal stones without pain.   He will need a repeat CT scan of the abdomen and pelvis with and without contrast to reassess the stones and also to assess for metastasis.        LOS: 5 days    Irine Seal 10/07/2020 [phone] ID: Jamie Wilson, male   DOB: 1956/07/15, 64 y.o.   MRN: [id number]

## 2020-10-07 NOTE — Anesthesia Postprocedure Evaluation (Signed)
Anesthesia Post Note  Patient: Jamie Wilson  Procedure(s) Performed: CYSTO (Right Bladder) POSSIBLE TRANSURETHRAL RESECTION OF BLADDER TUMOR (TURBT) (N/A Bladder)     Patient location during evaluation: PACU Anesthesia Type: General Level of consciousness: awake and alert Pain management: pain level controlled Vital Signs Assessment: post-procedure vital signs reviewed and stable Respiratory status: spontaneous breathing, nonlabored ventilation, respiratory function stable and patient connected to nasal cannula oxygen Cardiovascular status: blood pressure returned to baseline and stable Postop Assessment: no apparent nausea or vomiting Anesthetic complications: no   No complications documented.  Last Vitals:  Vitals:   10/07/20 1030 10/07/20 1031  BP: (!) 95/50 (!) 92/46  Pulse:  99  Resp:    Temp:  36.7 C  SpO2:      Last Pain:  Vitals:   10/07/20 1031  TempSrc: Axillary  PainSc:                  [place] L Equan Cogbill

## 2020-10-08 ENCOUNTER — Encounter (HOSPITAL_COMMUNITY): Payer: Self-pay | Admitting: Internal Medicine

## 2020-10-08 ENCOUNTER — Inpatient Hospital Stay (HOSPITAL_COMMUNITY): Payer: Medicaid Other

## 2020-10-08 LAB — COMPREHENSIVE METABOLIC PANEL
ALT: 18 U/L (ref 0–44)
AST: 16 U/L (ref 15–41)
Albumin: 1.9 g/dL — ABNORMAL LOW (ref 3.5–5.0)
Alkaline Phosphatase: 69 U/L (ref 38–126)
Anion gap: 7 (ref 5–15)
BUN: 14 mg/dL (ref 8–23)
CO2: 23 mmol/L (ref 22–32)
Calcium: 9.4 mg/dL (ref 8.9–10.3)
Chloride: 106 mmol/L (ref 98–111)
Creatinine, Ser: 0.99 mg/dL (ref 0.61–1.24)
GFR, Estimated: >60 mL/min (ref >60)
Glucose, Bld: 121 mg/dL — ABNORMAL HIGH (ref 70–99)
Potassium: 3.6 mmol/L (ref 3.5–5.1)
Sodium: 136 mmol/L (ref 135–145)
Total Bilirubin: 0.3 mg/dL (ref 0.3–1.2)
Total Protein: 5.5 g/dL — ABNORMAL LOW (ref 6.5–8.1)

## 2020-10-08 LAB — GLUCOSE, CAPILLARY: Glucose-Capillary: 85 mg/dL (ref 70–99)

## 2020-10-08 LAB — CBC
HCT: 27.2 % — ABNORMAL LOW (ref 39.0–52.0)
Hemoglobin: 8.5 g/dL — ABNORMAL LOW (ref 13.0–17.0)
MCH: 28.9 pg (ref 26.0–34.0)
MCHC: 31.3 g/dL (ref 30.0–36.0)
MCV: 92.5 fL (ref 80.0–100.0)
Platelets: 314 10*3/uL (ref 150–400)
RBC: 2.94 MIL/uL — ABNORMAL LOW (ref 4.22–5.81)
RDW: 14.1 % (ref 11.5–15.5)
WBC: 13.8 10*3/uL — ABNORMAL HIGH (ref 4.0–10.5)
nRBC: 0 % (ref 0.0–0.2)

## 2020-10-08 MED ORDER — SODIUM CHLORIDE (PF) 0.9 % IJ SOLN
INTRAMUSCULAR | Status: AC
  Filled 2020-10-08: qty 50

## 2020-10-08 MED ORDER — IOHEXOL 300 MG/ML  SOLN
100.0000 mL | Freq: Once | INTRAMUSCULAR | Status: AC | PRN
  Administered 2020-10-08: 01:00:00 100 mL via INTRAVENOUS

## 2020-10-08 MED ORDER — SODIUM CHLORIDE 0.9 % IV SOLN: INTRAVENOUS | Status: AC

## 2020-10-08 NOTE — Plan of Care (Signed)
  Problem: Education: Goal: Knowledge of General Education information will improve Description: Including pain rating scale, medication(s)/side effects and non-pharmacologic comfort measures Outcome: Progressing   Problem: Health Behavior/Discharge Planning: Goal: Ability to manage health-related needs will improve Outcome: Progressing   Problem: Clinical Measurements: Goal: Diagnostic test results will improve Outcome: Progressing Goal: Respiratory complications will improve Outcome: Progressing   Problem: Nutrition: Goal: Adequate nutrition will be maintained Outcome: Progressing   Problem: Elimination: Goal: Will not experience complications related to bowel motility Outcome: Progressing Goal: Will not experience complications related to urinary retention Outcome: Progressing   Problem: Safety: Goal: Ability to remain free from injury will improve Outcome: Progressing   Problem: Skin Integrity: Goal: Risk for impaired skin integrity will decrease Outcome: Progressing

## 2020-10-08 NOTE — Progress Notes (Signed)
[facility]  PROGRESS NOTE  Jamie Wilson WYS:[id number] DOB: 1956/01/14 DOA: 10/02/2020 PCP: Jamie Wilson, [name] Admit date - 10/02/2020   Admitting Physician Sueanne Margarita, DO  Outpatient Primary MD for the Jamie Wilson is Jamie Wilson, [name]  LOS - 6 Brief Narrative  Jamie Wilson is a 64 year old male with medical history significant for chronic urinary retention with self-catheterization, history of small bowel obstruction requiring bowel resection and colostomy remotely, with recent outpatient history of hematuria despite several rounds of outpatient antibiotics for presumed UTI who presented with worsening lower abdominal pain and flank pain and was found to have hypercalcemia with calcium of 13.5 on admission, concern for UTI started on IV ceftriaxone and concern for new bladder mass on CT imaging.  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  Jamie Wilson antibiotics were changed from ceftriaxone to cefepime due to concern for potential seeding with bacteremia given retrograde ureteral irrigation. Subjective  Abdominal pain seems to be adequately controlled with current pain regimen.  Denies any chest pain or shortness of breath  A & P   Necrotic bladder mass of uncertain etiology. Found on CT renal stone imaging. Concern for malignancy. Still having residual lower belly pain.  Urethral resection concerning for necrotic bladder tumor.  The abdomen shows bilateral nephrolithiasis without any obstructive uropathy with no obvious signs of metastases -Urology recommends continuing Foley catheter and further intervention for bladder cancer, awaiting pathology from biopsies will likely need discussion as outpatient -Pain regimen: IV morphine as needed for severe pain, increase oxycodone 5-10 mg as needed moderate pain, scheduled Tylenol, closely monitor  Hypotension, slightly improving.   Suspect may be related to presumed infection of  necrotic bladder, could also consider some level of dehydration however Jamie Wilson is receiving adequate amounts of maintenance fluids and bolus fluids leukocytosis is slightly improved, has remained afebrile.  Required fluid cessation with bolus fluids on 12/10, blood pressure 100/61 on maintenance fluids and no lactic acidosis.   -Continue maintenance fluids, goal MAP greater than 65 -Monitor blood cultures -If worsens will escalate antibiotics to cefepime to Zosyn for better anaerobic and gram-positive coverage  Right ureteral stone, nonobstructing. No current flank pain.  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  CT abdomen shows no fistula -Continue MiraLAX, monitor on his ostomy output  Prior to admission hematuria with purulent discharge, concerning for possible UTI. Suspect infected bladder mass given urologic assessment with TURBT p. -Continue IV cefepime  Hypercalcemia, unclear etiology, improved. Differential includes possible malignancy, dehydration/poor mobility. Vitamin D level within normal limits. Ca peak of 13 on admission, currently 8.8 status post Zometa earlier in hospitalization   -Follow-up PTH RP, currently pending -Continue IV fluids  Hyponatremia, mild, asymptomatic, resolved with IVF. -- monitor BMP  Hypophosphatemia -continue replacement and monitor  Chronic urinary retention for several years. 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 Self caths at home. Likely multifactorial etiology including bladder mass possible UTI, hypercalcemia -Continue indwelling Foley, Flomax  Normocytic anemia, currently stable with hemoglobin at 9-9.7.  Prior baseline 11-12.  Hemoglobin 8.5 today.  No bleeding episodes.  suspect anemia of chronic disease -Continue to closely monitor CBC, check iron panel, B12, folate   Underweight with BMI less than  18 -Nutrition consulted, assisting with supplementation -Continue multivitamins    Family Communication  : None at bedside  Code Status : Full  Disposition Plan  :  Jamie Wilson is from home. Anticipated d/c date: 2 to 3 days. Barriers to d/c or necessity for inpatient status: IV cefepime, monitor blood cultures, close monitoring blood pressure given still soft blood pressures Consults  : Urology  Procedures  :    DVT Prophylaxis  :  Hold on lovenox while monitoring anemia  MDM: The below labs and imaging reports were reviewed and summarized above.  Medication management as above.  Lab Results  Component Value Date   PLT 314 10/08/2020    Diet :  Diet Order            Diet regular Room service appropriate? Yes; Fluid consistency: Thin  Diet effective now                  Inpatient Medications Scheduled Meds: . (feeding supplement) PROSource Plus  30 mL Oral BID BM  . acetaminophen  1,000 mg Oral Q8H  . B-complex with vitamin C  1 tablet Oral Daily  . Chlorhexidine Gluconate Cloth  6 each Topical Daily  . feeding supplement  237 mL Oral BID BM  . multivitamin with minerals  1 tablet Oral Daily  . polyethylene glycol  17 g Oral BID  . tamsulosin  0.4 mg Oral Daily  . vitamin B-12  1,000 mcg Oral Daily   Continuous Infusions: . sodium chloride 100 mL/hr at 10/08/20 1014  . ceFEPime (MAXIPIME) IV 2 g (10/08/20 1129)   PRN Meds:.acetaminophen **OR** acetaminophen, bisacodyl, morphine injection, ondansetron **OR** ondansetron (ZOFRAN) IV, oxyCODONE  Antibiotics  :   Anti-infectives (From admission, onward)   Start     Dose/Rate Route Frequency Ordered Stop   10/06/20 1930  ceFEPIme (MAXIPIME) 2 g in sodium chloride 0.9 % 100 mL IVPB        2 g 200 mL/hr over 30 Minutes Intravenous Every 8 hours 10/06/20 1844     10/06/20 1430  cefTRIAXone (ROCEPHIN) 1 g in sodium chloride 0.9 % 100 mL IVPB        1 g 200 mL/hr over 30 Minutes Intravenous  Once 10/06/20 1429 10/06/20  1509   10/02/20 2200  cefTRIAXone (ROCEPHIN) 1 g in sodium chloride 0.9 % 100 mL IVPB  Status:  Discontinued        1 g 200 mL/hr over 30 Minutes Intravenous Every 24 hours 10/02/20 0437 10/06/20 1820   10/02/20 0230  cefTRIAXone (ROCEPHIN) 2 g in sodium chloride 0.9 % 100 mL IVPB        2 g 200 mL/hr over 30 Minutes Intravenous  Once 10/02/20 0226 10/02/20 0339       Objective   Vitals:   10/08/20 0549 10/08/20 0600 10/08/20 0602 10/08/20 1300  BP: (!) 96/56 100/61    Pulse:      Resp: 17     Temp: 98.3 F (36.8 C)   98.6 F (37 C)  TempSrc: Oral     SpO2: 94%     Weight:   59.3 kg   Height:   6\' 1"  (1.854 m)     SpO2: 94 % O2 Flow Rate (L/min): 2 L/min  Wt Readings from Last 3 Encounters:  10/08/20 59.3 kg  08/28/20 61.1 kg  07/20/20 68 kg     Intake/Output Summary (Last 24 hours) at 10/08/2020 1558 Last data filed at 10/08/2020 1300 Gross per 24 hour  Intake 1935.86 ml  Output 5500 ml  Net -3564.14 ml    Physical Exam:  Thin, frail male, in no distress Ostomy bag in  place with no liquid stool present, abdomen soft, slightly distended, tender in lower quadrant, diminished bowel sounds Foley catheter in place draining minimal amount of clear yellow urine Regular rate and rhythm, no appreciable murmurs Normal respiratory effort on room air      I have personally reviewed the following:   Data Reviewed:  CBC Recent Labs  Lab 10/02/20 0119 10/03/20 0515 10/05/20 0739 10/06/20 0509 10/07/20 0600 10/08/20 0620  WBC 19.7* 14.2* 16.7* 16.9* 15.7* 13.8*  HGB 12.1* 9.0* 9.7* 10.3* 9.2* 8.5*  HCT 38.3* 28.7* 31.3* 33.1* 29.7* 27.2*  PLT 430* 298 309 365 340 314  MCV 89.5 90.5 91.3 91.4 92.2 92.5  MCH 28.3 28.4 28.3 28.5 28.6 28.9  MCHC 31.6 31.4 31.0 31.1 31.0 31.3  RDW 13.9 13.7 13.8 13.9 14.1 14.1  LYMPHSABS 1.0  --   --   --   --   --   MONOABS 1.0  --   --   --   --   --   EOSABS 1.3*  --   --   --   --   --   BASOSABS 0.1  --   --   --   --    --     Chemistries  Recent Labs  Lab 10/04/20 0606 10/05/20 0739 10/06/20 0509 10/07/20 0600 10/08/20 0620  NA 137 133* 136 137 136  K 4.4 3.9 4.1 4.2 3.6  CL 105 106 106 107 106  CO2 25 21* 25 23 23   GLUCOSE 108* 94 101* 97 121*  BUN 17 16 15 13 14   CREATININE 0.83 0.78 0.86 0.89 0.99  CALCIUM 10.9* 10.3 10.5* 8.8* 9.4  AST 15 13* 12* 14* 16  ALT 15 15 15 16 18   ALKPHOS 55 61 66 57 69  BILITOT 0.3 0.4 0.2* 0.3 0.3   ------------------------------------------------------------------------------------------------------------------ No results for input(s): CHOL, HDL, LDLCALC, TRIG, CHOLHDL, LDLDIRECT in the last 72 hours.  Lab Results  Component Value Date   HGBA1C 5.7 (H) 08/19/2020   ------------------------------------------------------------------------------------------------------------------ No results for input(s): TSH, T4TOTAL, T3FREE, THYROIDAB in the last 72 hours.  Invalid input(s): FREET3 ------------------------------------------------------------------------------------------------------------------ No results for input(s): VITAMINB12, FOLATE, FERRITIN, TIBC, IRON, RETICCTPCT in the last 72 hours.  Coagulation profile No results for input(s): INR, PROTIME in the last 168 hours.  No results for input(s): DDIMER in the last 72 hours.  Cardiac Enzymes No results for input(s): CKMB, TROPONINI, MYOGLOBIN in the last 168 hours.  Invalid input(s): CK ------------------------------------------------------------------------------------------------------------------ No results found for: BNP  Micro Results Recent Results (from the past 240 hour(s))  Urine culture     Status: Abnormal   Collection Time: 10/02/20  2:27 AM   Specimen: Urine, Clean Catch  Result Value Ref Range Status   Specimen Description   Final    URINE, CLEAN CATCH Performed at [facility], [place] [address] [place], [place] [address]    Special Requests   Final     NONE Performed at [facility], [place] [address] [place], [place] [address]    Culture MULTIPLE SPECIES PRESENT, SUGGEST RECOLLECTION (A)  Final   Report Status 10/03/2020 FINAL  Final  Resp Panel by RT-PCR (Flu A&B, Covid) Nasopharyngeal Swab     Status: None   Collection Time: 10/02/20  3:03 AM   Specimen: Nasopharyngeal Swab; Nasopharyngeal(NP) swabs in vial transport medium  Result Value Ref Range Status   SARS Coronavirus 2 by RT PCR NEGATIVE NEGATIVE Final    Comment: (NOTE) SARS-CoV-2  target nucleic acids are NOT DETECTED.  The SARS-CoV-2 RNA is generally detectable in upper respiratory specimens during the acute phase of infection. The lowest concentration of SARS-CoV-2 viral copies this assay can detect is 138 copies/mL. A negative result does not preclude SARS-Cov-2 infection and should not be used as the sole basis for treatment or other Jamie Wilson management decisions. A negative result may occur with  improper specimen collection/handling, submission of specimen other than nasopharyngeal swab, presence of viral mutation(s) within the areas targeted by this assay, and inadequate number of viral copies(<138 copies/mL). A negative result must be combined with clinical observations, Jamie Wilson history, and epidemiological information. The expected result is Negative.  Fact Sheet for Patients:  EntrepreneurPulse.com.au  Fact Sheet for Healthcare Providers:  IncredibleEmployment.be  This test is no t yet approved or cleared by the [place] FDA and  has been authorized for detection and/or diagnosis of SARS-CoV-2 by FDA under an Emergency Use Authorization (EUA). This EUA will remain  in effect (meaning this test can be used) for the duration of the COVID-19 declaration under Section 564(b)(1) of the Act, 21 U.S.C.section 360bbb-3(b)(1), unless the authorization is terminated  or revoked sooner.       Influenza A by  PCR NEGATIVE NEGATIVE Final   Influenza B by PCR NEGATIVE NEGATIVE Final    Comment: (NOTE) The Xpert Xpress SARS-CoV-2/FLU/RSV plus assay is intended as an aid in the diagnosis of influenza from Nasopharyngeal swab specimens and should not be used as a sole basis for treatment. Nasal washings and aspirates are unacceptable for Xpert Xpress SARS-CoV-2/FLU/RSV testing.  Fact Sheet for Patients: EntrepreneurPulse.com.au  Fact Sheet for Healthcare Providers: IncredibleEmployment.be  This test is not yet approved or cleared by the [place] FDA and has been authorized for detection and/or diagnosis of SARS-CoV-2 by FDA under an Emergency Use Authorization (EUA). This EUA will remain in effect (meaning this test can be used) for the duration of the COVID-19 declaration under Section 564(b)(1) of the Act, 21 U.S.C. section 360bbb-3(b)(1), unless the authorization is terminated or revoked.  Performed at [facility], [place] [address], [place], [place] [address]   Culture, blood (routine x 2)     Status: None (Preliminary result)   Collection Time: 10/07/20  8:19 AM   Specimen: BLOOD  Result Value Ref Range Status   Specimen Description   Final    BLOOD RIGHT ANTECUBITAL Performed at [place] [address], [place], [place] [address]    Special Requests   Final    BOTTLES DRAWN AEROBIC AND ANAEROBIC Blood Culture adequate volume Performed at [place] [address], [place], [place] [address]    Culture   Final    NO GROWTH 1 DAY Performed at [facility], [place] [address] [place], [place] [address]    Report Status PENDING  Incomplete  Culture, blood (routine x 2)     Status: None (Preliminary result)   Collection Time: 10/07/20  8:25 AM   Specimen: BLOOD  Result Value Ref Range Status   Specimen Description   Final    BLOOD BLOOD RIGHT FOREARM Performed at [place] [address] [place], [place] [address]    Special Requests   Final    BOTTLES DRAWN AEROBIC ONLY Blood Culture adequate volume Performed at [place] [address], [place], [place] [address]    Culture   Final    NO GROWTH 1 DAY  Performed at [facility], [place] [address] [place], [place] [address]    Report Status PENDING  Incomplete    Radiology Reports CT ABDOMEN PELVIS W WO CONTRAST  Result Date: 10/08/2020 CLINICAL DATA:  Abdominal pain, bladder masses. Potential bladder enteric fistula EXAM: CT ABDOMEN AND PELVIS WITHOUT AND WITH CONTRAST TECHNIQUE: Multidetector CT imaging of the abdomen and pelvis was performed following the standard protocol before and following the bolus administration of intravenous contrast. CONTRAST:  128mL OMNIPAQUE IOHEXOL 300 MG/ML  SOLN COMPARISON:  None. FINDINGS: Lower chest: Bilateral small effusions.  Mild passive atelectasis. Hepatobiliary: Multiple benign hepatic cysts. Cysts are large measuring up to 5 cm. Gallbladder collapsed. Pancreas: Pancreas is normal. No ductal dilatation. No pancreatic inflammation. Spleen: Normal spleen Adrenals/urinary tract: Coarse RIGHT renal calculi. Small LEFT renal calculus. Ureteral calculi. No bladder calculi. No enhancing renal cortical lesion. Cortical scarring of the RIGHT kidney. Benign cyst of the LEFT kidney. Irregular thickening of the bladder wall with enhancement. Gas and nonenhancing material centrally within the bladder. Foley catheter with retention bulb in the bladder. Stomach/Bowel: Stomach, small bowel cecum normal. Appendix not identified. LEFT abdominal wall colostomy. Partial LEFT colon colectomy. Stump at the sigmoid colon. Rectum appears normal. Vascular/Lymphatic: Abdominal aorta is normal caliber with atherosclerotic calcification. There is no retroperitoneal or periportal lymphadenopathy. No pelvic lymphadenopathy. Reproductive: Prostate  unremarkable Other: No free fluid or free air Musculoskeletal: No aggressive osseous lesion IMPRESSION: 1. Irregular thickening of the bladder wall with enhancement. Gas and nonenhancing material centrally within the bladder. Bladder neoplasm is certainly a consideration. 2. Bilateral nephrolithiasis. No ureterolithiasis or obstructive uropathy. 3. 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 4. Aortic Atherosclerosis (ICD10-I70.0). Electronically Signed   By: Suzy Bouchard M.D.   On: 10/08/2020 04:19   DG Abd 1 View  Result Date: 10/03/2020 CLINICAL DATA:  Right ureteral stone EXAM: ABDOMEN - 1 VIEW COMPARISON:  10/02/2020 FINDINGS: Gaseous distension large and small bowel loops within the abdomen. Moderate volume stool within the right hemicolon. Numerous calcifications project over the right renal shadow. Known proximal right ureteral calculus is not definitively seen. IMPRESSION: 1. Numerous calcifications project over the right renal shadow. 2. Known proximal right ureteral calculus is not definitively seen. 3. Air-filled loops of large and small bowel suggesting ileus. Electronically Signed   By: Davina Poke D.O.   On: 10/03/2020 08:32   CT Renal Stone Study  Result Date: 10/02/2020 CLINICAL DATA:  Bilateral flank pain EXAM: CT ABDOMEN AND PELVIS WITHOUT CONTRAST TECHNIQUE: Multidetector CT imaging of the abdomen and pelvis was performed following the standard protocol without IV contrast. COMPARISON:  None. FINDINGS: Lower chest: The visualized heart size within normal limits. No pericardial fluid/thickening. No hiatal hernia. The visualized portions of the lungs are clear. Hepatobiliary: Multiple low-density lesions are seen throughout the liver parenchyma the largest in the right hepatic dome measures 5.5 cm. No evidence of calcified gallstones or biliary ductal dilatation. Pancreas:  Unremarkable.  No surrounding inflammatory changes. Spleen: Normal in size. Although limited due to  the lack of intravenous contrast, normal in appearance. Adrenals/Urinary Tract: Both adrenal glands appear normal there is mild right-sided pelviectasis with stranding changes. There are multiple coarse calcifications throughout the right kidney the largest measuring 8 mm within the upper pole. Within the proximal right ureter there is a 3 mm calculus present. There is calcifications seen in the lower pole the left kidney. Again noted within the bladder is heterogeneous soft tissue masslike area and wall thickening extending superiorly  which appears to have a fistulous connection to the overlying small bowel and colonic loops. There now appears to be small foci of air and debris. Stomach/Bowel: The stomach and small bowel are unremarkable. There is scattered colonic diverticula with a moderate amount of colonic stool present. A left lower quadrant ostomy is present. Vascular/Lymphatic: There are no enlarged abdominal or pelvic lymph nodes. Scattered aortic atherosclerotic calcifications are seen without aneurysmal dilatation. Reproductive: The Jamie Wilson is status post hysterectomy. No adnexal masses or collections seen. Other: No evidence of abdominal wall mass or hernia. Musculoskeletal: No acute or significant osseous findings. 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 Mild right pelviectasis with a proximal 3 mm ureteral calculi. Multiple bilateral non-obstructing renal calculi. Aortic Atherosclerosis (ICD10-I70.0). Electronically Signed   By: Prudencio Pair M.D.   On: 10/02/2020 03:46     Time Spent in minutes  30     Desiree Hane M.D on 10/08/2020 at 3:58 PM  To page go to [email or web address] - password [facility]

## 2020-10-08 NOTE — Progress Notes (Signed)
Urology Inpatient Progress Report   Procedure(s): CYSTO POSSIBLE TRANSURETHRAL RESECTION OF BLADDER TUMOR (TURBT)  2 Days Post-Op   Intv/Subj: Patient reports right abdominal pain this morning and feels like the stone is moving.  No nausea or emesis.   Principal Problem:   Acute lower UTI Active Problems:   Hypercalcemia   Acute urinary retention   Mass of bladder   Malnutrition (HCC)  Current Facility-Administered Medications  Medication Dose Route Frequency Provider Last Rate Last Admin  . (feeding supplement) PROSource Plus liquid 30 mL  30 mL Oral BID BM Irine Seal, MD   30 mL at 10/07/20 1956  . 0.9 %  sodium chloride infusion   Intravenous Continuous Oretha Milch D, MD 100 mL/hr at 10/07/20 2121 New Bag at 10/07/20 2121  . acetaminophen (TYLENOL) tablet 650 mg  650 mg Oral Q6H PRN Irine Seal, MD   650 mg at 10/08/20 0093   Or  . acetaminophen (TYLENOL) suppository 650 mg  650 mg Rectal Q6H PRN Irine Seal, MD      . acetaminophen (TYLENOL) tablet 1,000 mg  1,000 mg Oral Q8H Irine Seal, MD   1,000 mg at 10/07/20 2104  . B-complex with vitamin C tablet 1 tablet  1 tablet Oral Daily Irine Seal, MD   1 tablet at 10/07/20 1117  . bisacodyl (DULCOLAX) EC tablet 5 mg  5 mg Oral Daily PRN Irine Seal, MD      . ceFEPIme (MAXIPIME) 2 g in sodium chloride 0.9 % 100 mL IVPB  2 g Intravenous Q8H Pham, Anh P, RPH 200 mL/hr at 10/08/20 0342 2 g at 10/08/20 0342  . Chlorhexidine Gluconate Cloth 2 % PADS 6 each  6 each Topical Daily Irine Seal, MD   6 each at 10/07/20 1116  . feeding supplement (ENSURE ENLIVE / ENSURE PLUS) liquid 237 mL  237 mL Oral BID BM Irine Seal, MD   237 mL at 10/07/20 1408  . morphine 2 MG/ML injection 2 mg  2 mg Intravenous Q4H PRN Irine Seal, MD   2 mg at 10/07/20 0240  . multivitamin with minerals tablet 1 tablet  1 tablet Oral Daily Irine Seal, MD   1 tablet at 10/07/20 1116  . ondansetron (ZOFRAN) tablet 4 mg  4 mg Oral Q6H PRN Irine Seal, MD        Or  . ondansetron [facility]) injection 4 mg  4 mg Intravenous Q6H PRN Irine Seal, MD      . oxyCODONE (Oxy IR/ROXICODONE) immediate release tablet 5-10 mg  5-10 mg Oral Q4H PRN Irine Seal, MD   5 mg at 10/08/20 0348  . polyethylene glycol (MIRALAX / GLYCOLAX) packet 17 g  17 g Oral BID Irine Seal, MD   17 g at 10/04/20 0936  . sodium chloride (PF) 0.9 % injection           . tamsulosin (FLOMAX) capsule 0.4 mg  0.4 mg Oral Daily Irine Seal, MD   0.4 mg at 10/07/20 1116  . vitamin B-12 (CYANOCOBALAMIN) tablet 1,000 mcg  1,000 mcg Oral Daily Irine Seal, MD   1,000 mcg at 10/07/20 1116     Objective: Vital: Vitals:   10/07/20 2100 10/08/20 0549 10/08/20 0600 10/08/20 0602  BP: 110/67 (!) 96/56 100/61   Pulse: 93     Resp: 20 17    Temp: 98 F (36.7 C) 98.3 F (36.8 C)    TempSrc: Oral Oral    SpO2:  94%    Weight:  59.3 kg  Height:    6\' 1"  (1.854 m)   I/Os: I/O last 3 completed shifts: In: 3710 [P.O.:1080; I.V.:2399; IV Piggyback:2472.1] Out: 6269 [Urine:4750]  Physical Exam:  General: Patient is in no apparent distress Lungs: Normal respiratory effort, chest expands symmetrically. GI: The abdomen is soft and nontender, productive ostomy Foley: draining clear yellow urine Ext: lower extremities symmetric  Lab Results: Recent Labs    10/06/20 0509 10/07/20 0600 10/08/20 0620  WBC 16.9* 15.7* 13.8*  HGB 10.3* 9.2* 8.5*  HCT 33.1* 29.7* 27.2*   Recent Labs    10/06/20 0509 10/07/20 0600 10/08/20 0620  NA 136 137 136  K 4.1 4.2 3.6  CL 106 107 106  CO2 25 23 23   GLUCOSE 101* 97 121*  BUN 15 13 14   CREATININE 0.86 0.89 0.99  CALCIUM 10.5* 8.8* 9.4   No results for input(s): LABPT, INR in the last 72 hours. No results for input(s): LABURIN in the last 72 hours. Results for orders placed or performed during the hospital encounter of 10/02/20  Urine culture     Status: Abnormal   Collection Time: 10/02/20  2:27 AM   Specimen: Urine, Clean Catch  Result  Value Ref Range Status   Specimen Description   Final    URINE, CLEAN CATCH Performed at [facility], [place] [address] [place], [place] [address]    Special Requests   Final    NONE Performed at [facility], [place] [address] [place], [place] [address]    Culture MULTIPLE SPECIES PRESENT, SUGGEST RECOLLECTION (A)  Final   Report Status 10/03/2020 FINAL  Final  Resp Panel by RT-PCR (Flu A&B, Covid) Nasopharyngeal Swab     Status: None   Collection Time: 10/02/20  3:03 AM   Specimen: Nasopharyngeal Swab; Nasopharyngeal(NP) swabs in vial transport medium  Result Value Ref Range Status   SARS Coronavirus 2 by RT PCR NEGATIVE NEGATIVE Final    Comment: (NOTE) SARS-CoV-2 target nucleic acids are NOT DETECTED.  The SARS-CoV-2 RNA is generally detectable in upper respiratory specimens during the acute phase of infection. The lowest concentration of SARS-CoV-2 viral copies this assay can detect is 138 copies/mL. A negative result does not preclude SARS-Cov-2 infection and should not be used as the sole basis for treatment or other patient management decisions. A negative result may occur with  improper specimen collection/handling, submission of specimen other than nasopharyngeal swab, presence of viral mutation(s) within the areas targeted by this assay, and inadequate number of viral copies(<138 copies/mL). A negative result must be combined with clinical observations, patient history, and epidemiological information. The expected result is Negative.  Fact Sheet for Patients:  EntrepreneurPulse.com.au  Fact Sheet for Healthcare Providers:  IncredibleEmployment.be  This test is no t yet approved or cleared by the [place] FDA and  has been authorized for detection and/or diagnosis of SARS-CoV-2 by FDA under an Emergency Use Authorization (EUA). This EUA will remain  in effect (meaning this test can be used)  for the duration of the COVID-19 declaration under Section 564(b)(1) of the Act, 21 U.S.C.section 360bbb-3(b)(1), unless the authorization is terminated  or revoked sooner.       Influenza A by PCR NEGATIVE NEGATIVE Final   Influenza B by PCR NEGATIVE NEGATIVE Final    Comment: (NOTE) The Xpert Xpress SARS-CoV-2/FLU/RSV plus assay is intended as an aid in the diagnosis of influenza from Nasopharyngeal swab specimens and should not be used as a sole basis for  treatment. Nasal washings and aspirates are unacceptable for Xpert Xpress SARS-CoV-2/FLU/RSV testing.  Fact Sheet for Patients: EntrepreneurPulse.com.au  Fact Sheet for Healthcare Providers: IncredibleEmployment.be  This test is not yet approved or cleared by the [place] FDA and has been authorized for detection and/or diagnosis of SARS-CoV-2 by FDA under an Emergency Use Authorization (EUA). This EUA will remain in effect (meaning this test can be used) for the duration of the COVID-19 declaration under Section 564(b)(1) of the Act, 21 U.S.C. section 360bbb-3(b)(1), unless the authorization is terminated or revoked.  Performed at [facility], [place] [address], [place], [place] [address]     Studies/Results: CT ABDOMEN PELVIS W WO CONTRAST  Result Date: 10/08/2020 CLINICAL DATA:  Abdominal pain, bladder masses. Potential bladder enteric fistula EXAM: CT ABDOMEN AND PELVIS WITHOUT AND WITH CONTRAST TECHNIQUE: Multidetector CT imaging of the abdomen and pelvis was performed following the standard protocol before and following the bolus administration of intravenous contrast. CONTRAST:  128mL OMNIPAQUE IOHEXOL 300 MG/ML  SOLN COMPARISON:  None. FINDINGS: Lower chest: Bilateral small effusions.  Mild passive atelectasis. Hepatobiliary: Multiple benign hepatic cysts. Cysts are large measuring up to 5 cm. Gallbladder collapsed. Pancreas: Pancreas is normal. No ductal  dilatation. No pancreatic inflammation. Spleen: Normal spleen Adrenals/urinary tract: Coarse RIGHT renal calculi. Small LEFT renal calculus. Ureteral calculi. No bladder calculi. No enhancing renal cortical lesion. Cortical scarring of the RIGHT kidney. Benign cyst of the LEFT kidney. Irregular thickening of the bladder wall with enhancement. Gas and nonenhancing material centrally within the bladder. Foley catheter with retention bulb in the bladder. Stomach/Bowel: Stomach, small bowel cecum normal. Appendix not identified. LEFT abdominal wall colostomy. Partial LEFT colon colectomy. Stump at the sigmoid colon. Rectum appears normal. Vascular/Lymphatic: Abdominal aorta is normal caliber with atherosclerotic calcification. There is no retroperitoneal or periportal lymphadenopathy. No pelvic lymphadenopathy. Reproductive: Prostate unremarkable Other: No free fluid or free air Musculoskeletal: No aggressive osseous lesion IMPRESSION: 1. Irregular thickening of the bladder wall with enhancement. Gas and nonenhancing material centrally within the bladder. Bladder neoplasm is certainly a consideration. 2. Bilateral nephrolithiasis. No ureterolithiasis or obstructive uropathy. 3. 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 4. Aortic Atherosclerosis (ICD10-I70.0). Electronically Signed   By: Suzy Bouchard M.D.   On: 10/08/2020 04:19    Assessment: 64 year old man with necrotic bladder tumor as well as 3 mm right proximal ureteral calculus now POD#2 s/p partial TURBT.     Plan: -Patient to keep Foley catheter until further intervention for bladder cancer -Repeat CT scan shows resolution of 3 mm right proximal calculus -Pathology pending -We will need outpatient follow-up to discuss pathology results and next steps.  Jacalyn Lefevre, MD Urology 10/08/2020, 7:01 AM

## 2020-10-08 NOTE — Plan of Care (Signed)
  Problem: Urinary Elimination: Goal: Signs and symptoms of infection will decrease 10/08/2020 0004 by Baker Pierini, RN Outcome: Progressing 10/08/2020 0003 by Baker Pierini, RN Outcome: Progressing

## 2020-10-09 ENCOUNTER — Inpatient Hospital Stay (HOSPITAL_COMMUNITY): Payer: Medicaid Other

## 2020-10-09 DIAGNOSIS — R194 Change in bowel habit: Secondary | ICD-10-CM

## 2020-10-09 DIAGNOSIS — R1030 Lower abdominal pain, unspecified: Secondary | ICD-10-CM

## 2020-10-09 LAB — CBC
HCT: 27.6 % — ABNORMAL LOW (ref 39.0–52.0)
Hemoglobin: 8.5 g/dL — ABNORMAL LOW (ref 13.0–17.0)
MCH: 28.6 pg (ref 26.0–34.0)
MCHC: 30.8 g/dL (ref 30.0–36.0)
MCV: 92.9 fL (ref 80.0–100.0)
Platelets: 339 10*3/uL (ref 150–400)
RBC: 2.97 MIL/uL — ABNORMAL LOW (ref 4.22–5.81)
RDW: 13.8 % (ref 11.5–15.5)
WBC: 13.7 10*3/uL — ABNORMAL HIGH (ref 4.0–10.5)
nRBC: 0 % (ref 0.0–0.2)

## 2020-10-09 LAB — COMPREHENSIVE METABOLIC PANEL
ALT: 19 U/L (ref 0–44)
AST: 15 U/L (ref 15–41)
Albumin: 1.8 g/dL — ABNORMAL LOW (ref 3.5–5.0)
Alkaline Phosphatase: 69 U/L (ref 38–126)
Anion gap: 7 (ref 5–15)
BUN: 17 mg/dL (ref 8–23)
CO2: 25 mmol/L (ref 22–32)
Calcium: 9.8 mg/dL (ref 8.9–10.3)
Chloride: 105 mmol/L (ref 98–111)
Creatinine, Ser: 0.96 mg/dL (ref 0.61–1.24)
GFR, Estimated: >60 mL/min (ref >60)
Glucose, Bld: 114 mg/dL — ABNORMAL HIGH (ref 70–99)
Potassium: 3.8 mmol/L (ref 3.5–5.1)
Sodium: 137 mmol/L (ref 135–145)
Total Bilirubin: 0.1 mg/dL — ABNORMAL LOW (ref 0.3–1.2)
Total Protein: 5.6 g/dL — ABNORMAL LOW (ref 6.5–8.1)

## 2020-10-09 LAB — PTH-RELATED PEPTIDE
PTH-related peptide: <2 pmol/L
PTH-related peptide: <2 pmol/L

## 2020-10-09 LAB — IRON AND TIBC
Iron: 15 ug/dL — ABNORMAL LOW (ref 45–182)
Saturation Ratios: 9 % — ABNORMAL LOW (ref 17.9–39.5)
TIBC: 162 ug/dL — ABNORMAL LOW (ref 250–450)
UIBC: 147 ug/dL

## 2020-10-09 LAB — FOLATE: Folate: 5.1 ng/mL — ABNORMAL LOW (ref >5.9)

## 2020-10-09 LAB — FERRITIN: Ferritin: 226 ng/mL (ref 24–336)

## 2020-10-09 LAB — VITAMIN B12: Vitamin B-12: 502 pg/mL (ref 180–914)

## 2020-10-09 MED ORDER — SORBITOL 70 % SOLN
15.0000 mL | Freq: Once | Status: AC
  Administered 2020-10-09: 19:00:00 15 mL via ORAL
  Filled 2020-10-09: qty 30

## 2020-10-09 MED ORDER — SENNOSIDES-DOCUSATE SODIUM 8.6-50 MG PO TABS
2.0000 | ORAL_TABLET | Freq: Two times a day (BID) | ORAL | Status: DC
  Administered 2020-10-09 – 2020-10-10 (×2): 2 via ORAL
  Filled 2020-10-09 (×6): qty 2

## 2020-10-09 NOTE — Progress Notes (Signed)
[facility]  PROGRESS NOTE  Jamie Wilson IRW:[id number] DOB: September 17, 1956 DOA: 10/02/2020 PCP: Patient, [name] Admit date - 10/02/2020   Admitting Physician Jamie Margarita, DO  Outpatient Primary MD for the patient is Patient, [name]  LOS - 7 Brief Narrative  [name] is a 64 year old male with medical history significant for chronic urinary retention with self-catheterization, history of small bowel obstruction requiring bowel resection and colostomy remotely, with recent outpatient history of hematuria despite several rounds of outpatient antibiotics for presumed UTI who presented with worsening lower abdominal pain and flank pain and was found to have hypercalcemia with calcium of 13.5 on admission, concern for UTI started on IV ceftriaxone and concern for new bladder mass on CT imaging.  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  Patient antibiotics were changed from ceftriaxone to cefepime due to concern for potential seeding with bacteremia given retrograde ureteral irrigation.   Subjective  Still complaining of abdominal pain.  Having gas at ostomy site but no stool in the past 24 hours  A & P   Necrotic bladder mass of uncertain etiology. Found on CT renal stone imaging. Concern for malignancy. Still having residual lower belly pain.  Urethral resection concerning for necrotic bladder tumor.  The abdomen shows bilateral nephrolithiasis without any obstructive uropathy with no obvious signs of metastases -Urology recommends continuing Foley catheter and further intervention for bladder cancer, awaiting pathology from biopsies will likely need discussion as outpatient -Pain regimen: IV morphine as needed for severe pain, increase oxycodone 5-10 mg as needed moderate pain, scheduled Tylenol, closely monitor --On IV cefepime, blood cultures unremarkable, some evidence of infected mass on urology examination, would  like to de-escalate given afebrile, blood pressure improving, will de-escalate to oral antibiotic and monitor  Hypotension,  improving.  BP 128/77 suspect may be related to presumed infection of necrotic bladder, could also consider some level of dehydration however patient is receiving adequate amounts of maintenance fluids and bolus fluids leukocytosis is slightly improved, has remained afebrile.  Required fluid cessation with bolus fluids on 12/10, blood pressure 100/61 on maintenance fluids and no lactic acidosis.   -Continue maintenance fluids, goal MAP greater than 65 -Monitor blood cultures -If worsens will escalate antibiotics to cefepime to Zosyn for better anaerobic and gram-positive coverage  Right ureteral stone, nonobstructing. No current flank pain.  CT abdomen confirms nonobstructive -Continue Flomax   Constipation, large stool burden on abdominal x-ray without evidence of bowel obstruction, in setting of opioid use.  CT abdomen shows no fistula, abdominal x-ray shows large stool burden with no evidence of obstruction or ileus.  Occurring in the setting of opioid regimen. -Optimize bowel regimen add sorbitol, monitor output -Continue MiraLAX, monitor on his ostomy output  Prior to admission hematuria with purulent discharge, concerning for possible UTI. Suspect infected bladder mass given urologic assessment with TURBT p. -Continue IV cefepime  Hypercalcemia, unclear etiology, improved. Differential includes possible malignancy, dehydration/poor mobility. Vitamin D level within normal limits. Ca peak of 13 on admission, currently 8.8 status post Zometa earlier in hospitalization   -Follow-up PTH RP, currently pending -Continue IV fluids  Hyponatremia, mild, asymptomatic, resolved with IVF. -- monitor BMP  Hypophosphatemia -continue replacement and monitor  Chronic urinary retention for several years. 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 Self caths at home. Likely multifactorial  etiology including bladder mass possible UTI, hypercalcemia -Continue indwelling Foley, Flomax  Normocytic anemia, currently stable with hemoglobin at 9-9.7.  Prior baseline 11-12.  Hemoglobin 8.5 today.  No bleeding episodes.  suspect anemia of chronic disease -Continue to closely monitor CBC, check iron panel, B12, folate   Underweight with BMI less than 18 -Nutrition consulted, assisting with supplementation -Continue multivitamins    Family Communication  : None at bedside  Code Status : Full  Disposition Plan  :  Patient is from home. Anticipated d/c date: 2 to 3 days. Barriers to d/c or necessity for inpatient status: IV cefepime, monitor blood cultures, close monitoring blood pressure given still soft blood pressures Consults  : Urology  Procedures  :    DVT Prophylaxis  :  Hold on lovenox while monitoring anemia  MDM: The below labs and imaging reports were reviewed and summarized above.  Medication management as above.  Lab Results  Component Value Date   PLT 339 10/09/2020    Diet :  Diet Order            Diet regular Room service appropriate? Yes; Fluid consistency: Thin  Diet effective now                  Inpatient Medications Scheduled Meds: . (feeding supplement) PROSource Plus  30 mL Oral BID BM  . acetaminophen  1,000 mg Oral Q8H  . B-complex with vitamin C  1 tablet Oral Daily  . Chlorhexidine Gluconate Cloth  6 each Topical Daily  . feeding supplement  237 mL Oral BID BM  . multivitamin with minerals  1 tablet Oral Daily  . polyethylene glycol  17 g Oral BID  . senna-docusate  2 tablet Oral BID  . tamsulosin  0.4 mg Oral Daily  . vitamin B-12  1,000 mcg Oral Daily   Continuous Infusions: . ceFEPime (MAXIPIME) IV 2 g (10/09/20 1242)   PRN Meds:.acetaminophen **OR** acetaminophen, bisacodyl, morphine injection, ondansetron **OR** ondansetron (ZOFRAN) IV, oxyCODONE  Antibiotics  :   Anti-infectives (From admission, onward)   Start      Dose/Rate Route Frequency Ordered Stop   10/06/20 1930  ceFEPIme (MAXIPIME) 2 g in sodium chloride 0.9 % 100 mL IVPB        2 g 200 mL/hr over 30 Minutes Intravenous Every 8 hours 10/06/20 1844     10/06/20 1430  cefTRIAXone (ROCEPHIN) 1 g in sodium chloride 0.9 % 100 mL IVPB        1 g 200 mL/hr over 30 Minutes Intravenous  Once 10/06/20 1429 10/06/20 1509   10/02/20 2200  cefTRIAXone (ROCEPHIN) 1 g in sodium chloride 0.9 % 100 mL IVPB  Status:  Discontinued        1 g 200 mL/hr over 30 Minutes Intravenous Every 24 hours 10/02/20 0437 10/06/20 1820   10/02/20 0230  cefTRIAXone (ROCEPHIN) 2 g in sodium chloride 0.9 % 100 mL IVPB        2 g 200 mL/hr over 30 Minutes Intravenous  Once 10/02/20 0226 10/02/20 0339       Objective   Vitals:   10/09/20 0900 10/09/20 1000 10/09/20 1200 10/09/20 1400  BP: 113/64 108/69 99/60 128/77  Pulse:      Resp: (!) 22 19 13  (!) 24  Temp:    98 F (36.7 C)  TempSrc:    Oral  SpO2: 92%   96%  Weight:      Height:        SpO2: 96 % O2 Flow Rate (L/min): 2 L/min  Wt Readings from Last 3 Encounters:  10/09/20 63.6 kg  08/28/20 61.1 kg  07/20/20 68 kg     Intake/Output Summary (Last 24 hours) at 10/09/2020 1707 Last data filed at 10/09/2020 1410 Gross per 24 hour  Intake 1957.36 ml  Output 4425 ml  Net -2467.64 ml    Physical Exam:  Thin, frail male, in no distress Ostomy bag in place with no liquid stool present but gas obviously present, abdomen soft, slightly distended, tender in lower quadrant, diminished bowel sounds Foley catheter in place draining minimal amount of clear yellow urine Regular rate and rhythm, no appreciable murmurs Normal respiratory effort on room air      I have personally reviewed the following:   Data Reviewed:  CBC Recent Labs  Lab 10/05/20 0739 10/06/20 0509 10/07/20 0600 10/08/20 0620 10/09/20 0537  WBC 16.7* 16.9* 15.7* 13.8* 13.7*  HGB 9.7* 10.3* 9.2* 8.5* 8.5*  HCT 31.3* 33.1* 29.7*  27.2* 27.6*  PLT 309 365 340 314 339  MCV 91.3 91.4 92.2 92.5 92.9  MCH 28.3 28.5 28.6 28.9 28.6  MCHC 31.0 31.1 31.0 31.3 30.8  RDW 13.8 13.9 14.1 14.1 13.8    Chemistries  Recent Labs  Lab 10/05/20 0739 10/06/20 0509 10/07/20 0600 10/08/20 0620 10/09/20 0537  NA 133* 136 137 136 137  K 3.9 4.1 4.2 3.6 3.8  CL 106 106 107 106 105  CO2 21* 25 23 23 25   GLUCOSE 94 101* 97 121* 114*  BUN 16 15 13 14 17   CREATININE 0.78 0.86 0.89 0.99 0.96  CALCIUM 10.3 10.5* 8.8* 9.4 9.8  AST 13* 12* 14* 16 15  ALT 15 15 16 18 19   ALKPHOS 61 66 57 69 69  BILITOT 0.4 0.2* 0.3 0.3 0.1*   ------------------------------------------------------------------------------------------------------------------ No results for input(s): CHOL, HDL, LDLCALC, TRIG, CHOLHDL, LDLDIRECT in the last 72 hours.  Lab Results  Component Value Date   HGBA1C 5.7 (H) 08/19/2020   ------------------------------------------------------------------------------------------------------------------ No results for input(s): TSH, T4TOTAL, T3FREE, THYROIDAB in the last 72 hours.  Invalid input(s): FREET3 ------------------------------------------------------------------------------------------------------------------ Recent Labs    10/09/20 0537  VITAMINB12 502  FOLATE 5.1*  FERRITIN 226  TIBC 162*  IRON 15*    Coagulation profile No results for input(s): INR, PROTIME in the last 168 hours.  No results for input(s): DDIMER in the last 72 hours.  Cardiac Enzymes No results for input(s): CKMB, TROPONINI, MYOGLOBIN in the last 168 hours.  Invalid input(s): CK ------------------------------------------------------------------------------------------------------------------ No results found for: BNP  Micro Results Recent Results (from the past 240 hour(s))  Urine culture     Status: Abnormal   Collection Time: 10/02/20  2:27 AM   Specimen: Urine, Clean Catch  Result Value Ref Range Status   Specimen  Description   Final    URINE, CLEAN CATCH Performed at [facility], [place] [address] [place], [place] [address]    Special Requests   Final    NONE Performed at [facility], [place] [address], [place], [place] [address]    Culture MULTIPLE SPECIES PRESENT, SUGGEST RECOLLECTION (A)  Final   Report Status 10/03/2020 FINAL  Final  Resp Panel by RT-PCR (Flu A&B, Covid) Nasopharyngeal Swab     Status: None   Collection Time: 10/02/20  3:03 AM   Specimen: Nasopharyngeal Swab; Nasopharyngeal(NP) swabs in vial transport medium  Result Value Ref Range Status   SARS Coronavirus 2 by RT PCR NEGATIVE NEGATIVE Final    Comment: (NOTE) SARS-CoV-2 target nucleic acids are NOT DETECTED.  The SARS-CoV-2 RNA is generally detectable  in upper respiratory specimens during the acute phase of infection. The lowest concentration of SARS-CoV-2 viral copies this assay can detect is 138 copies/mL. A negative result does not preclude SARS-Cov-2 infection and should not be used as the sole basis for treatment or other patient management decisions. A negative result may occur with  improper specimen collection/handling, submission of specimen other than nasopharyngeal swab, presence of viral mutation(s) within the areas targeted by this assay, and inadequate number of viral copies(<138 copies/mL). A negative result must be combined with clinical observations, patient history, and epidemiological information. The expected result is Negative.  Fact Sheet for Patients:  EntrepreneurPulse.com.au  Fact Sheet for Healthcare Providers:  IncredibleEmployment.be  This test is no t yet approved or cleared by the [place] FDA and  has been authorized for detection and/or diagnosis of SARS-CoV-2 by FDA under an Emergency Use Authorization (EUA). This EUA will remain  in effect (meaning this test can be used) for the duration of the COVID-19  declaration under Section 564(b)(1) of the Act, 21 U.S.C.section 360bbb-3(b)(1), unless the authorization is terminated  or revoked sooner.       Influenza A by PCR NEGATIVE NEGATIVE Final   Influenza B by PCR NEGATIVE NEGATIVE Final    Comment: (NOTE) The Xpert Xpress SARS-CoV-2/FLU/RSV plus assay is intended as an aid in the diagnosis of influenza from Nasopharyngeal swab specimens and should not be used as a sole basis for treatment. Nasal washings and aspirates are unacceptable for Xpert Xpress SARS-CoV-2/FLU/RSV testing.  Fact Sheet for Patients: EntrepreneurPulse.com.au  Fact Sheet for Healthcare Providers: IncredibleEmployment.be  This test is not yet approved or cleared by the [place] FDA and has been authorized for detection and/or diagnosis of SARS-CoV-2 by FDA under an Emergency Use Authorization (EUA). This EUA will remain in effect (meaning this test can be used) for the duration of the COVID-19 declaration under Section 564(b)(1) of the Act, 21 U.S.C. section 360bbb-3(b)(1), unless the authorization is terminated or revoked.  Performed at [facility], [place] [address], [place], [place] [address]   Culture, blood (routine x 2)     Status: None (Preliminary result)   Collection Time: 10/07/20  8:19 AM   Specimen: BLOOD  Result Value Ref Range Status   Specimen Description   Final    BLOOD RIGHT ANTECUBITAL Performed at [place] [address] [place], [place] [address]    Special Requests   Final    BOTTLES DRAWN AEROBIC AND ANAEROBIC Blood Culture adequate volume Performed at [place] [address], [place], [place] [address]    Culture   Final    NO GROWTH 2 DAYS Performed at [place] [address] [place], [place] [address]    Report Status PENDING  Incomplete  Culture, blood (routine x 2)     Status: None (Preliminary result)    Collection Time: 10/07/20  8:25 AM   Specimen: BLOOD  Result Value Ref Range Status   Specimen Description   Final    BLOOD BLOOD RIGHT FOREARM Performed at [place] [address] [place], [place] [address]    Special Requests   Final    BOTTLES DRAWN AEROBIC ONLY Blood Culture adequate volume Performed at [place] [address] [place], [place] [address]    Culture   Final    NO GROWTH 2 DAYS Performed at [place] [address] [place], [place] [address]  Report Status PENDING  Incomplete    Radiology Reports CT ABDOMEN PELVIS W WO CONTRAST  Result Date: 10/08/2020 CLINICAL DATA:  Abdominal pain, bladder masses. Potential bladder enteric fistula EXAM: CT ABDOMEN AND PELVIS WITHOUT AND WITH CONTRAST TECHNIQUE: Multidetector CT imaging of the abdomen and pelvis was performed following the standard protocol before and following the bolus administration of intravenous contrast. CONTRAST:  126mL OMNIPAQUE IOHEXOL 300 MG/ML  SOLN COMPARISON:  None. FINDINGS: Lower chest: Bilateral small effusions.  Mild passive atelectasis. Hepatobiliary: Multiple benign hepatic cysts. Cysts are large measuring up to 5 cm. Gallbladder collapsed. Pancreas: Pancreas is normal. No ductal dilatation. No pancreatic inflammation. Spleen: Normal spleen Adrenals/urinary tract: Coarse RIGHT renal calculi. Small LEFT renal calculus. Ureteral calculi. No bladder calculi. No enhancing renal cortical lesion. Cortical scarring of the RIGHT kidney. Benign cyst of the LEFT kidney. Irregular thickening of the bladder wall with enhancement. Gas and nonenhancing material centrally within the bladder. Foley catheter with retention bulb in the bladder. Stomach/Bowel: Stomach, small bowel cecum normal. Appendix not identified. LEFT abdominal wall colostomy. Partial LEFT colon colectomy. Stump at the sigmoid colon. Rectum appears normal. Vascular/Lymphatic: Abdominal aorta  is normal caliber with atherosclerotic calcification. There is no retroperitoneal or periportal lymphadenopathy. No pelvic lymphadenopathy. Reproductive: Prostate unremarkable Other: No free fluid or free air Musculoskeletal: No aggressive osseous lesion IMPRESSION: 1. Irregular thickening of the bladder wall with enhancement. Gas and nonenhancing material centrally within the bladder. Bladder neoplasm is certainly a consideration. 2. Bilateral nephrolithiasis. No ureterolithiasis or obstructive uropathy. 3. 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 4. Aortic Atherosclerosis (ICD10-I70.0). Electronically Signed   By: Suzy Bouchard M.D.   On: 10/08/2020 04:19   DG Abd 1 View  Result Date: 10/03/2020 CLINICAL DATA:  Right ureteral stone EXAM: ABDOMEN - 1 VIEW COMPARISON:  10/02/2020 FINDINGS: Gaseous distension large and small bowel loops within the abdomen. Moderate volume stool within the right hemicolon. Numerous calcifications project over the right renal shadow. Known proximal right ureteral calculus is not definitively seen. IMPRESSION: 1. Numerous calcifications project over the right renal shadow. 2. Known proximal right ureteral calculus is not definitively seen. 3. Air-filled loops of large and small bowel suggesting ileus. Electronically Signed   By: Davina Poke D.O.   On: 10/03/2020 08:32   DG Abd Portable 1V  Result Date: 10/09/2020 CLINICAL DATA:  Abdominal pain. EXAM: PORTABLE ABDOMEN - 1 VIEW COMPARISON:  October 03, 2020. FINDINGS: No abnormal bowel dilatation is noted. Large amount of stool is seen throughout the colon. Right nephrolithiasis is noted. IMPRESSION: Large stool burden is noted. No evidence of bowel obstruction or ileus. Electronically Signed   By: Marijo Conception M.D.   On: 10/09/2020 12:10   CT Renal Stone Study  Result Date: 10/02/2020 CLINICAL DATA:  Bilateral flank pain EXAM: CT ABDOMEN AND PELVIS WITHOUT CONTRAST TECHNIQUE: Multidetector CT imaging of  the abdomen and pelvis was performed following the standard protocol without IV contrast. COMPARISON:  None. FINDINGS: Lower chest: The visualized heart size within normal limits. No pericardial fluid/thickening. No hiatal hernia. The visualized portions of the lungs are clear. Hepatobiliary: Multiple low-density lesions are seen throughout the liver parenchyma the largest in the right hepatic dome measures 5.5 cm. No evidence of calcified gallstones or biliary ductal dilatation. Pancreas:  Unremarkable.  No surrounding inflammatory changes. Spleen: Normal in size. Although limited due to the lack of intravenous contrast, normal in appearance. Adrenals/Urinary Tract: Both adrenal glands appear normal there is mild right-sided pelviectasis with stranding  changes. There are multiple coarse calcifications throughout the right kidney the largest measuring 8 mm within the upper pole. Within the proximal right ureter there is a 3 mm calculus present. There is calcifications seen in the lower pole the left kidney. Again noted within the bladder is heterogeneous soft tissue masslike area and wall thickening extending superiorly which appears to have a fistulous connection to the overlying small bowel and colonic loops. There now appears to be small foci of air and debris. Stomach/Bowel: The stomach and small bowel are unremarkable. There is scattered colonic diverticula with a moderate amount of colonic stool present. A left lower quadrant ostomy is present. Vascular/Lymphatic: There are no enlarged abdominal or pelvic lymph nodes. Scattered aortic atherosclerotic calcifications are seen without aneurysmal dilatation. Reproductive: The patient is status post hysterectomy. No adnexal masses or collections seen. Other: No evidence of abdominal wall mass or hernia. Musculoskeletal: No acute or significant osseous findings. IMPRESSION: Heterogeneous soft tissue mass extending from the bladder superiorly which appears to likely  have a fistulous connection to the overlying small bowel and colonic loops, concerning for bladder neoplasm. Mild right pelviectasis with a proximal 3 mm ureteral calculi. Multiple bilateral non-obstructing renal calculi. Aortic Atherosclerosis (ICD10-I70.0). Electronically Signed   By: Prudencio Pair M.D.   On: 10/02/2020 03:46     Time Spent in minutes  30     Desiree Hane M.D on 10/09/2020 at 5:07 PM  To page go to [email or web address] - password [facility]

## 2020-10-09 NOTE — Progress Notes (Signed)
Pharmacy Antibiotic Note  Jamie Wilson is a 64 y.o. male with chronic urinary retention, bladder tumor and right ureteral stone who underwent transurethral resection of mass on 10/06/20.  Pharmacy has been consulted to change abx from ceftriaxone to cefepime for broader urologic coverage.  Today, 10/09/2020:  D11 abx, D3 full cefepime  WBC remain slightly elevated  Afebrile  SCr stable WNL  Plan: Continue cefepime 2gm q8h; pharmacy will sign off note writing, following peripherally for culture results or changes in clinical status.  ____________________________  Height: 6\' 1"  (185.4 cm) Weight: 63.6 kg (140 lb 3.4 oz) IBW/kg (Calculated) : 79.9  Temp (24hrs), Avg:98.3 F (36.8 C), Min:97.9 F (36.6 C), Max:98.6 F (37 C)  Recent Labs  Lab 10/05/20 0739 10/06/20 0509 10/07/20 0600 10/07/20 0824 10/07/20 1113 10/07/20 1830 10/07/20 2134 10/08/20 0620 10/09/20 0537  WBC 16.7* 16.9* 15.7*  --   --   --   --  13.8* 13.7*  CREATININE 0.78 0.86 0.89  --   --   --   --  0.99 0.96  LATICACIDVEN  --   --   --  0.9 1.9 1.3 1.3  --   --     Estimated Creatinine Clearance: 69 mL/min (by C-G formula based on SCr of 0.96 mg/dL).    Allergies  Allergen Reactions  . Silvadene [Silver Sulfadiazine] Rash      Thank you for allowing pharmacy to be a part of this patient's care.  Robinette Esters A 10/09/2020 11:40 AM

## 2020-10-10 ENCOUNTER — Inpatient Hospital Stay (HOSPITAL_COMMUNITY): Payer: Medicaid Other

## 2020-10-10 DIAGNOSIS — A419 Sepsis, unspecified organism: Secondary | ICD-10-CM

## 2020-10-10 DIAGNOSIS — C679 Malignant neoplasm of bladder, unspecified: Principal | ICD-10-CM

## 2020-10-10 LAB — COMPREHENSIVE METABOLIC PANEL
ALT: 19 U/L (ref 0–44)
AST: 18 U/L (ref 15–41)
Albumin: 2 g/dL — ABNORMAL LOW (ref 3.5–5.0)
Alkaline Phosphatase: 74 U/L (ref 38–126)
Anion gap: 8 (ref 5–15)
BUN: 17 mg/dL (ref 8–23)
CO2: 25 mmol/L (ref 22–32)
Calcium: 9.7 mg/dL (ref 8.9–10.3)
Chloride: 102 mmol/L (ref 98–111)
Creatinine, Ser: 0.9 mg/dL (ref 0.61–1.24)
GFR, Estimated: >60 mL/min (ref >60)
Glucose, Bld: 116 mg/dL — ABNORMAL HIGH (ref 70–99)
Potassium: 4.2 mmol/L (ref 3.5–5.1)
Sodium: 135 mmol/L (ref 135–145)
Total Bilirubin: 0.2 mg/dL — ABNORMAL LOW (ref 0.3–1.2)
Total Protein: 6.3 g/dL — ABNORMAL LOW (ref 6.5–8.1)

## 2020-10-10 LAB — CBC
HCT: 28.9 % — ABNORMAL LOW (ref 39.0–52.0)
Hemoglobin: 8.9 g/dL — ABNORMAL LOW (ref 13.0–17.0)
MCH: 28 pg (ref 26.0–34.0)
MCHC: 30.8 g/dL (ref 30.0–36.0)
MCV: 90.9 fL (ref 80.0–100.0)
Platelets: 388 10*3/uL (ref 150–400)
RBC: 3.18 MIL/uL — ABNORMAL LOW (ref 4.22–5.81)
RDW: 13.8 % (ref 11.5–15.5)
WBC: 15.3 10*3/uL — ABNORMAL HIGH (ref 4.0–10.5)
nRBC: 0 % (ref 0.0–0.2)

## 2020-10-10 LAB — SURGICAL PATHOLOGY

## 2020-10-10 MED ORDER — ENSURE ENLIVE PO LIQD
237.0000 mL | Freq: Three times a day (TID) | ORAL | Status: DC
  Administered 2020-10-10 – 2020-10-13 (×10): 237 mL via ORAL

## 2020-10-10 MED ORDER — SODIUM CHLORIDE 0.9 % IV SOLN
1.0000 g | Freq: Three times a day (TID) | INTRAVENOUS | Status: DC
  Administered 2020-10-10 – 2020-10-12 (×6): 1 g via INTRAVENOUS
  Filled 2020-10-10 (×7): qty 1

## 2020-10-10 NOTE — Plan of Care (Signed)
  Problem: Education: Goal: Knowledge of General Education information will improve Description: Including pain rating scale, medication(s)/side effects and non-pharmacologic comfort measures Outcome: Progressing   Problem: Activity: Goal: Risk for activity intolerance will decrease Outcome: Progressing   

## 2020-10-10 NOTE — TOC Progression Note (Signed)
Transition of Care [facility]) - Progression Note    Patient Details  Name: Jamie Wilson MRN: [id number] Date of Birth: [birth year]  [facility] CM/SW Contact  Arty Lantzy, Juliann Pulse, RN Phone Number: 10/10/2020, 2:21 PM  Clinical Narrative: d/c plan-return back to recovery house;may need transportation. PT ordered-await recc.      Expected Discharge Plan: Home/Self Care Barriers to Discharge: Continued Medical Work up  Expected Discharge Plan and Services Expected Discharge Plan: Home/Self Care   Discharge Planning Services: CM Consult   Living arrangements for the past 2 months: Single Family Home                                       Social Determinants of Health (SDOH) Interventions    Readmission Risk Interventions Readmission Risk Prevention Plan 10/04/2020  Transportation Screening Complete  PCP or Specialist Appt within 5-7 Days Complete  Home Care Screening Complete  Medication Review (RN CM) Complete

## 2020-10-10 NOTE — Progress Notes (Signed)
I'm not sure what this means.  I will sign it.  I understand he is still hospitalized over the weekend.

## 2020-10-10 NOTE — Progress Notes (Addendum)
Oncology Short Note  Medical oncology referral for bladder cancer received. Scheduling message sent to schedule patient for outpatient consultation with Dr Irene Limbo on 12/16 at 1:30pm.  Sullivan Lone MD MS

## 2020-10-10 NOTE — Progress Notes (Signed)
Nutrition Follow-up  DOCUMENTATION CODES:   Severe malnutrition in context of chronic illness,Underweight  INTERVENTION:   -Provided "Calcium content of foods" in discharge instructions -per pt request  -Continue Ensure Plus po TID, each supplement provides 350 kcal and 13 grams of protein (300 mg Ca each) -D/c Magic cups -Prosource Plus PO BID, each provides 100 kcals and 15g protein   NEW NUTRITION DIAGNOSIS:   Severe Malnutrition related to chronic illness as evidenced by severe fat depletion,severe muscle depletion.  GOAL:   Patient will meet greater than or equal to 90% of their needs  Progressing.  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 He presented to the ED due to not feeling well for several months and pain which has been worsening and is now 10/10; nothing makes it better or worse and he has not eaten for 2 days d/t pain.  12/9: s/p TURBT  Patient in room with a tray at bedside full of Ensure Plus, multiple cranberry juices and mango icees. Pt states he has primarily been consuming these 3 items. He was concerned with what foods have a lot of calcium.  States he is now staying away from milk and dairy. Feels okay with Ensures though. RD to provide a handout that provides the calcium content of foods. 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   Will increase Ensure to TID as pt's main protein source. He does have a pizza on his tray that he plans to eat as well.   Pt very talkative and is hard to redirect back to nutrition-based conversation. Pt complaining about living situation. RD recommended he mention these concerns to the MD. He also mentions he has a hard time cooking at home. Pt reports feeling that staff are  pushing him out. "If you lived where I live, you wouldn't be letting me leave".   Per pt, he weighed 140 lbs at admission. Per weight records, pt weighed 125 lbs at admission. Now weighs 140 lbs which may be somewhat r/t fluid.  Medications: B-complex w/ Vitamin C, Multivitamin with minerals daily, Senokot, Vitamin B-12  Labs reviewed: CBGs: 85  NUTRITION - FOCUSED PHYSICAL EXAM:  Flowsheet Row Most Recent Value  Orbital Region Moderate depletion  Upper Arm Region Severe depletion  Thoracic and Lumbar Region Unable to assess  Buccal Region Severe depletion  Temple Region Severe depletion  Clavicle Bone Region Severe depletion  Clavicle and Acromion Bone Region Severe depletion  Scapular Bone Region Severe depletion  Dorsal Hand Moderate depletion  Patellar Region Unable to assess  Anterior Thigh Region Unable to assess  Posterior Calf Region Unable to assess       Diet Order:   Diet Order            Diet regular Room service appropriate? Yes; Fluid consistency: Thin  Diet effective now                 EDUCATION NEEDS:   Not appropriate for education at this time  Skin:  Skin Assessment: Reviewed RN Assessment  Last BM:  12/3 (patient reported)  Height:   Ht Readings from Last 1 Encounters:  10/08/20 6\' 1"  (1.854 m)    Weight:   Wt Readings from Last 1 Encounters:  10/09/20 63.6 kg   BMI:  Body mass index is 18.5 kg/m.  Estimated Nutritional Needs:  Kcal:  0990-6893 kcal  Protein:  100-115 grams  Fluid:  >/= 2 L/day   Clayton Bibles, MS, RD, LDN Inpatient Clinical Dietitian Contact information available via Amion

## 2020-10-10 NOTE — Progress Notes (Signed)
Pharmacy Antibiotic Note  Jamie Wilson is a 64 y.o. male with chronic urinary retention, bladder tumor and right ureteral stone who underwent transurethral resection of mass on 10/06/20. Due to worsening leukocytosis, intermittent tachycardia, and still relative hypotension on Cefepime, MD broadening antibiotic coverage to Meropenem for better ESBL coverage for infected bladder mass. Pharmacy has been consulted for Meropenem dosing.   Plan: Meropenem 1g IV q8h Monitor renal function, cultures, clinical course  ____________________________  Height: 6\' 1"  (185.4 cm) Weight: 63.6 kg (140 lb 3.4 oz) IBW/kg (Calculated) : 79.9  Temp (24hrs), Avg:98.5 F (36.9 C), Min:98 F (36.7 C), Max:98.9 F (37.2 C)  Recent Labs  Lab 10/06/20 0509 10/07/20 0600 10/07/20 0824 10/07/20 1113 10/07/20 1830 10/07/20 2134 10/08/20 0620 10/09/20 0537 10/10/20 0511  WBC 16.9* 15.7*  --   --   --   --  13.8* 13.7* 15.3*  CREATININE 0.86 0.89  --   --   --   --  0.99 0.96 0.90  LATICACIDVEN  --   --  0.9 1.9 1.3 1.3  --   --   --     Estimated Creatinine Clearance: 73.6 mL/min (by C-G formula based on SCr of 0.9 mg/dL).    Allergies  Allergen Reactions  . Silvadene [Silver Sulfadiazine] Rash      Thank you for allowing pharmacy to be a part of this patient's care.   Lindell Spar, PharmD, BCPS Clinical Pharmacist  10/10/2020 2:58 PM

## 2020-10-10 NOTE — Progress Notes (Signed)
[facility]  PROGRESS NOTE  [name] FYB:[id number] DOB: 06-30-1956 DOA: 10/02/2020 PCP: Jamie Wilson Admit date - 10/02/2020   Admitting Physician Sueanne Margarita, DO  Outpatient Primary MD for the patient is Jamie Wilson  LOS - 8 Brief Narrative  Jamie Wilson is a 64 year old male with medical history significant for chronic urinary retention with self-catheterization, history of small bowel obstruction requiring bowel resection and colostomy remotely, with recent outpatient history of hematuria despite several rounds of outpatient antibiotics for presumed UTI who presented with worsening lower abdominal pain and flank pain and was found to have hypercalcemia with calcium of 13.5 on admission, concern for UTI started on IV ceftriaxone and concern for new bladder mass on CT imaging.  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  Patient antibiotics were changed from ceftriaxone to cefepime due to concern for potential seeding with bacteremia given retrograde ureteral irrigation.   Subjective  Still complaining of abdominal pain.  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  Found on CT renal stone imaging. Concern for malignancy. Still having residual lower belly pain but likely being exacerbated by constipation as noted on abd xr in the setting of opioids.   -Urology recommends continuing Foley catheter and further intervention for bladder cancer,  --Discussed with Dr. Irene Limbo, will obtain CT chest for further staging (CXR only shows small pleural effusions, and arrange outpatient appointment this week to talk about likely chemotherapy and importance of adherence -Pain regimen:  IV morphine as needed for severe pain, increase oxycodone 5-10 mg as needed moderate pain, scheduled Tylenol, closely monitor --On IV cefepime though no growth on blood cultures, leukocytosis persists and is slightly worse, with some tachycardia (intermittent)and relatively soft BP. I will switch to IV zosyn to cover ESBL organisms that IV cefepime will miss  ( last urine culture from 10/21 shows E.coli resistant to keflex, ciprofloxacin, and bactrim)  Sepsis secondary to infected bladder mass. Tachycardic,  Leukocytosis, and nidus for infection likely known mass in bladder further complicated by chronic indwelling foley ( present on admission). Also have hypotension with BP 99/62 but still maintaining MAP> 65. Afebrile, no lactic acidosis, prior Urine cultures with E.coli resistant to fluroquinolones, zosyn, keflex.  -Continue maintenance fluids, goal MAP greater than 65 -Monitor blood cultures -Change to Meropenem for better coverage for possible esbl ( urine culture this admission non-diagnostic)  Right ureteral stone, nonobstructing. No current flank pain.  CT abdomen confirms nonobstructive -Continue Flomax   Constipation, large stool burden on abdominal x-ray without evidence of bowel obstruction, in setting of opioid use.  CT abdomen shows no fistula, abdominal x-ray shows large stool burden with no evidence of obstruction or ileus.  Occurring in the setting of opioid regimen. -Optimize bowel regimen redose sorbitol, monitor output -Continue MiraLAX, monitor on his ostomy output  Prior to admission hematuria with purulent discharge, concerning for possible UTI. Suspect infected bladder mass given urologic assessment with TURBT p. -Change to Meropenem  Hypercalcemia, unclear etiology, improved. Differential includes possible malignancy, dehydration/poor mobility. Vitamin D level within normal limits. Ca peak of 13 on admission, currently 8.8 status post Zometa earlier in hospitalization    -Follow-up PTH RP, currently pending -Continue IV fluids  Hyponatremia, mild, asymptomatic, resolved with IVF. -- monitor BMP  Hypophosphatemia -continue replacement and monitor  Chronic urinary retention for several years. 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 Self caths at home. Likely multifactorial etiology including bladder mass possible UTI, hypercalcemia -Continue indwelling Foley, Flomax  Normocytic anemia, currently stable with hemoglobin at 9-9.7.  Prior baseline 11-12.  Hemoglobin stable today.  No bleeding episodes.  suspect anemia of chronic disease -Continue to closely monitor CBC, check iron panel, B12, folate   Underweight with BMI less than 18 -Nutrition consulted, assisting with supplementation -Continue multivitamins    Family Communication  : None at bedside  Code Status : Full  Disposition Plan  :  Patient is from home. Anticipated d/c date: 1 to 2 days. Barriers to d/c or necessity for inpatient status: change to IV meropenem, monitor blood cultures, close monitoring blood pressure given still soft blood pressures Consults  : Urology  Procedures  :    DVT Prophylaxis  :  Hold on lovenox while monitoring anemia  MDM: The below labs and imaging reports were reviewed and summarized above.  Medication management as above.  Lab Results  Component Value Date   PLT 388 10/10/2020    Diet :  Diet Order            Diet regular Room service appropriate? Yes; Fluid consistency: Thin  Diet effective now                  Inpatient Medications Scheduled Meds: . (feeding supplement) PROSource Plus  30 mL Oral BID BM  . acetaminophen  1,000 mg Oral Q8H  . B-complex with vitamin C  1 tablet Oral Daily  . Chlorhexidine Gluconate Cloth  6 each Topical Daily  . feeding supplement  237 mL Oral TID BM  . multivitamin with minerals  1 tablet Oral Daily  . polyethylene glycol  17 g Oral BID  . senna-docusate  2 tablet Oral BID  . tamsulosin  0.4 mg Oral Daily   . vitamin B-12  1,000 mcg Oral Daily   Continuous Infusions: . ceFEPime (MAXIPIME) IV 2 g (10/10/20 1122)   PRN Meds:.acetaminophen **OR** acetaminophen, bisacodyl, morphine injection, ondansetron **OR** ondansetron (ZOFRAN) IV, oxyCODONE  Antibiotics  :   Anti-infectives (From admission, onward)   Start     Dose/Rate Route Frequency Ordered Stop   10/06/20 1930  ceFEPIme (MAXIPIME) 2 g in sodium chloride 0.9 % 100 mL IVPB        2 g 200 mL/hr over 30 Minutes Intravenous Every 8 hours 10/06/20 1844     10/06/20 1430  cefTRIAXone (ROCEPHIN) 1 g in sodium chloride 0.9 % 100 mL IVPB        1 g 200 mL/hr over 30 Minutes Intravenous  Once 10/06/20 1429 10/06/20 1509   10/02/20 2200  cefTRIAXone (ROCEPHIN) 1 g in sodium chloride 0.9 % 100 mL IVPB  Status:  Discontinued        1 g 200 mL/hr over 30 Minutes Intravenous Every 24 hours 10/02/20 0437 10/06/20 1820   10/02/20 0230  cefTRIAXone (ROCEPHIN) 2 g in sodium chloride 0.9 % 100 mL IVPB        2 g 200 mL/hr over 30 Minutes Intravenous  Once 10/02/20 0226 10/02/20 0339       Objective   Vitals:   10/10/20 0327 10/10/20 0328 10/10/20 0330 10/10/20 0840  BP: 103/65   99/62  Pulse:      Resp: 11 12 15 15   Temp: 98 F (  36.7 C)     TempSrc:      SpO2: 97%     Weight:      Height:        SpO2: 97 % O2 Flow Rate (L/min): 2 L/min  Wt Readings from Last 3 Encounters:  10/09/20 63.6 kg  08/28/20 61.1 kg  07/20/20 68 kg     Intake/Output Summary (Last 24 hours) at 10/10/2020 1356 Last data filed at 10/10/2020 1300 Gross Wilson 24 hour  Intake 340 ml  Output 4400 ml  Net -4060 ml    Physical Exam:  Thin, frail male, in no distress Ostomy bag in place with no liquid stool present but gas obviously present, abdomen soft, increased  distension, mildly tender in lower quadrant, diminished bowel sounds, no rebound tenderness or guarding Foley catheter in place draining minimal amount of cloudy urine with sediment Regular rate  and rhythm, no appreciable murmurs Normal respiratory effort on room air      I have personally reviewed the following:   Data Reviewed:  CBC Recent Labs  Lab 10/06/20 0509 10/07/20 0600 10/08/20 0620 10/09/20 0537 10/10/20 0511  WBC 16.9* 15.7* 13.8* 13.7* 15.3*  HGB 10.3* 9.2* 8.5* 8.5* 8.9*  HCT 33.1* 29.7* 27.2* 27.6* 28.9*  PLT 365 340 314 339 388  MCV 91.4 92.2 92.5 92.9 90.9  MCH 28.5 28.6 28.9 28.6 28.0  MCHC 31.1 31.0 31.3 30.8 30.8  RDW 13.9 14.1 14.1 13.8 13.8    Chemistries  Recent Labs  Lab 10/06/20 0509 10/07/20 0600 10/08/20 0620 10/09/20 0537 10/10/20 0511  NA 136 137 136 137 135  K 4.1 4.2 3.6 3.8 4.2  CL 106 107 106 105 102  CO2 25 23 23 25 25   GLUCOSE 101* 97 121* 114* 116*  BUN 15 13 14 17 17   CREATININE 0.86 0.89 0.99 0.96 0.90  CALCIUM 10.5* 8.8* 9.4 9.8 9.7  AST 12* 14* 16 15 18   ALT 15 16 18 19 19   ALKPHOS 66 57 69 69 74  BILITOT 0.2* 0.3 0.3 0.1* 0.2*   ------------------------------------------------------------------------------------------------------------------ No results for input(s): CHOL, HDL, LDLCALC, TRIG, CHOLHDL, LDLDIRECT in the last 72 hours.  Lab Results  Component Value Date   HGBA1C 5.7 (H) 08/19/2020   ------------------------------------------------------------------------------------------------------------------ No results for input(s): TSH, T4TOTAL, T3FREE, THYROIDAB in the last 72 hours.  Invalid input(s): FREET3 ------------------------------------------------------------------------------------------------------------------ Recent Labs    10/09/20 0537  VITAMINB12 502  FOLATE 5.1*  FERRITIN 226  TIBC 162*  IRON 15*    Coagulation profile No results for input(s): INR, PROTIME in the last 168 hours.  No results for input(s): DDIMER in the last 72 hours.  Cardiac Enzymes No results for input(s): CKMB, TROPONINI, MYOGLOBIN in the last 168 hours.  Invalid input(s):  CK ------------------------------------------------------------------------------------------------------------------ No results found for: BNP  Micro Results Recent Results (from the past 240 hour(s))  Urine culture     Status: Abnormal   Collection Time: 10/02/20  2:27 AM   Specimen: Urine, Clean Catch  Result Value Ref Range Status   Specimen Description   Final    URINE, CLEAN CATCH Performed at [facility], [place] [address] [place], [place] [address]    Special Requests   Final    NONE Performed at [facility], [place] [address], [place], [place] [address]    Culture MULTIPLE SPECIES PRESENT, SUGGEST RECOLLECTION (A)  Final   Report Status 10/03/2020 FINAL  Final  Resp Panel by RT-PCR (Flu A&B, Covid) Nasopharyngeal Swab  Status: None   Collection Time: 10/02/20  3:03 AM   Specimen: Nasopharyngeal Swab; Nasopharyngeal(NP) swabs in vial transport medium  Result Value Ref Range Status   SARS Coronavirus 2 by RT PCR NEGATIVE NEGATIVE Final    Comment: (NOTE) SARS-CoV-2 target nucleic acids are NOT DETECTED.  The SARS-CoV-2 RNA is generally detectable in upper respiratory specimens during the acute phase of infection. The lowest concentration of SARS-CoV-2 viral copies this assay can detect is 138 copies/mL. A negative result does not preclude SARS-Cov-2 infection and should not be used as the sole basis for treatment or other patient management decisions. A negative result may occur with  improper specimen collection/handling, submission of specimen other than nasopharyngeal swab, presence of viral mutation(s) within the areas targeted by this assay, and inadequate number of viral copies(<138 copies/mL). A negative result must be combined with clinical observations, patient history, and epidemiological information. The expected result is Negative.  Fact Sheet for Patients:  EntrepreneurPulse.com.au  Fact Sheet for  Healthcare Providers:  IncredibleEmployment.be  This test is no t yet approved or cleared by the [place] FDA and  has been authorized for detection and/or diagnosis of SARS-CoV-2 by FDA under an Emergency Use Authorization (EUA). This EUA will remain  in effect (meaning this test can be used) for the duration of the COVID-19 declaration under Section 564(b)(1) of the Act, 21 U.S.C.section 360bbb-3(b)(1), unless the authorization is terminated  or revoked sooner.       Influenza A by PCR NEGATIVE NEGATIVE Final   Influenza B by PCR NEGATIVE NEGATIVE Final    Comment: (NOTE) The Xpert Xpress SARS-CoV-2/FLU/RSV plus assay is intended as an aid in the diagnosis of influenza from Nasopharyngeal swab specimens and should not be used as a sole basis for treatment. Nasal washings and aspirates are unacceptable for Xpert Xpress SARS-CoV-2/FLU/RSV testing.  Fact Sheet for Patients: EntrepreneurPulse.com.au  Fact Sheet for Healthcare Providers: IncredibleEmployment.be  This test is not yet approved or cleared by the [place] FDA and has been authorized for detection and/or diagnosis of SARS-CoV-2 by FDA under an Emergency Use Authorization (EUA). This EUA will remain in effect (meaning this test can be used) for the duration of the COVID-19 declaration under Section 564(b)(1) of the Act, 21 U.S.C. section 360bbb-3(b)(1), unless the authorization is terminated or revoked.  Performed at [facility], [place] [address], [place], [place] [address]   Culture, blood (routine x 2)     Status: None (Preliminary result)   Collection Time: 10/07/20  8:19 AM   Specimen: BLOOD  Result Value Ref Range Status   Specimen Description   Final    BLOOD RIGHT ANTECUBITAL Performed at [facility] [address], [place], [place] [address]    Special Requests   Final    BOTTLES DRAWN AEROBIC AND  ANAEROBIC Blood Culture adequate volume Performed at [place] [address], [place], [place] [address]    Culture   Final    NO GROWTH 3 DAYS Performed at [facility], [place] [address], [place], [place] [address]    Report Status PENDING  Incomplete  Culture, blood (routine x 2)     Status: None (Preliminary result)   Collection Time: 10/07/20  8:25 AM   Specimen: BLOOD  Result Value Ref Range Status   Specimen Description   Final    BLOOD BLOOD RIGHT FOREARM Performed at [facility] [address] [place], [place] [address]  Special Requests   Final    BOTTLES DRAWN AEROBIC ONLY Blood Culture adequate volume Performed at [facility] [address] [place], [place] [address]    Culture   Final    NO GROWTH 3 DAYS Performed at [facility], [place] [address], [place], [place] [address]    Report Status PENDING  Incomplete    Radiology Reports CT ABDOMEN PELVIS W WO CONTRAST  Result Date: 10/08/2020 CLINICAL DATA:  Abdominal pain, bladder masses. Potential bladder enteric fistula EXAM: CT ABDOMEN AND PELVIS WITHOUT AND WITH CONTRAST TECHNIQUE: Multidetector CT imaging of the abdomen and pelvis was performed following the standard protocol before and following the bolus administration of intravenous contrast. CONTRAST:  131mL OMNIPAQUE IOHEXOL 300 MG/ML  SOLN COMPARISON:  None. FINDINGS: Lower chest: Bilateral small effusions.  Mild passive atelectasis. Hepatobiliary: Multiple benign hepatic cysts. Cysts are large measuring up to 5 cm. Gallbladder collapsed. Pancreas: Pancreas is normal. No ductal dilatation. No pancreatic inflammation. Spleen: Normal spleen Adrenals/urinary tract: Coarse RIGHT renal calculi. Small LEFT renal calculus. Ureteral calculi. No bladder calculi. No enhancing renal cortical lesion. Cortical scarring of the RIGHT kidney. Benign cyst of the LEFT kidney. Irregular thickening of the  bladder wall with enhancement. Gas and nonenhancing material centrally within the bladder. Foley catheter with retention bulb in the bladder. Stomach/Bowel: Stomach, small bowel cecum normal. Appendix not identified. LEFT abdominal wall colostomy. Partial LEFT colon colectomy. Stump at the sigmoid colon. Rectum appears normal. Vascular/Lymphatic: Abdominal aorta is normal caliber with atherosclerotic calcification. There is no retroperitoneal or periportal lymphadenopathy. No pelvic lymphadenopathy. Reproductive: Prostate unremarkable Other: No free fluid or free air Musculoskeletal: No aggressive osseous lesion IMPRESSION: 1. Irregular thickening of the bladder wall with enhancement. Gas and nonenhancing material centrally within the bladder. Bladder neoplasm is certainly a consideration. 2. Bilateral nephrolithiasis. No ureterolithiasis or obstructive uropathy. 3. 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 4. Aortic Atherosclerosis (ICD10-I70.0). Electronically Signed   By: Suzy Bouchard M.D.   On: 10/08/2020 04:19   DG Chest 2 View  Result Date: 10/10/2020 CLINICAL DATA:  Bladder cancer. EXAM: CHEST - 2 VIEW COMPARISON:  CT abdomen/pelvis 10/08/2020. Prior chest radiograph 08/16/2020. FINDINGS: Heart size within normal limits. Aortic atherosclerosis. No appreciable airspace consolidation or pulmonary nodules. Biapical pleuroparenchymal scarring. Possible trace right pleural effusion. No evidence of pneumothorax. No acute bony abnormality. IMPRESSION: No appreciable airspace consolidation or pulmonary nodules. Possible trace right pleural effusion. Aortic Atherosclerosis (ICD10-I70.0). Electronically Signed   By: Kellie Simmering DO   On: 10/10/2020 09:32   DG Abd 1 View  Result Date: 10/03/2020 CLINICAL DATA:  Right ureteral stone EXAM: ABDOMEN - 1 VIEW COMPARISON:  10/02/2020 FINDINGS: Gaseous distension large and small bowel loops within the abdomen. Moderate volume stool within the right  hemicolon. Numerous calcifications project over the right renal shadow. Known proximal right ureteral calculus is not definitively seen. IMPRESSION: 1. Numerous calcifications project over the right renal shadow. 2. Known proximal right ureteral calculus is not definitively seen. 3. Air-filled loops of large and small bowel suggesting ileus. Electronically Signed   By: Davina Poke D.O.   On: 10/03/2020 08:32   DG Abd Portable 1V  Result Date: 10/09/2020 CLINICAL DATA:  Abdominal pain. EXAM: PORTABLE ABDOMEN - 1 VIEW COMPARISON:  October 03, 2020. FINDINGS: No abnormal bowel dilatation is noted. Large amount of stool is seen throughout the colon. Right nephrolithiasis is noted. IMPRESSION: Large stool burden is noted. No evidence of bowel obstruction or ileus. Electronically  Signed   By: Marijo Conception M.D.   On: 10/09/2020 12:10   CT Renal Stone Study  Result Date: 10/02/2020 CLINICAL DATA:  Bilateral flank pain EXAM: CT ABDOMEN AND PELVIS WITHOUT CONTRAST TECHNIQUE: Multidetector CT imaging of the abdomen and pelvis was performed following the standard protocol without IV contrast. COMPARISON:  None. FINDINGS: Lower chest: The visualized heart size within normal limits. No pericardial fluid/thickening. No hiatal hernia. The visualized portions of the lungs are clear. Hepatobiliary: Multiple low-density lesions are seen throughout the liver parenchyma the largest in the right hepatic dome measures 5.5 cm. No evidence of calcified gallstones or biliary ductal dilatation. Pancreas:  Unremarkable.  No surrounding inflammatory changes. Spleen: Normal in size. Although limited due to the lack of intravenous contrast, normal in appearance. Adrenals/Urinary Tract: Both adrenal glands appear normal there is mild right-sided pelviectasis with stranding changes. There are multiple coarse calcifications throughout the right kidney the largest measuring 8 mm within the upper pole. Within the proximal right ureter  there is a 3 mm calculus present. There is calcifications seen in the lower pole the left kidney. Again noted within the bladder is heterogeneous soft tissue masslike area and wall thickening extending superiorly which appears to have a fistulous connection to the overlying small bowel and colonic loops. There now appears to be small foci of air and debris. Stomach/Bowel: The stomach and small bowel are unremarkable. There is scattered colonic diverticula with a moderate amount of colonic stool present. A left lower quadrant ostomy is present. Vascular/Lymphatic: There are no enlarged abdominal or pelvic lymph nodes. Scattered aortic atherosclerotic calcifications are seen without aneurysmal dilatation. Reproductive: The patient is status post hysterectomy. No adnexal masses or collections seen. Other: No evidence of abdominal wall mass or hernia. Musculoskeletal: No acute or significant osseous findings. 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 Mild right pelviectasis with a proximal 3 mm ureteral calculi. Multiple bilateral non-obstructing renal calculi. Aortic Atherosclerosis (ICD10-I70.0). Electronically Signed   By: Prudencio Pair M.D.   On: 10/02/2020 03:46     Time Spent in minutes  30     Desiree Hane M.D on 10/10/2020 at 1:56 PM  To page go to [email or web address] - password [email or web address]

## 2020-10-11 ENCOUNTER — Inpatient Hospital Stay (HOSPITAL_COMMUNITY): Payer: Medicaid Other

## 2020-10-11 ENCOUNTER — Telehealth: Payer: Self-pay | Admitting: Hematology

## 2020-10-11 DIAGNOSIS — R1084 Generalized abdominal pain: Secondary | ICD-10-CM

## 2020-10-11 DIAGNOSIS — R140 Abdominal distension (gaseous): Secondary | ICD-10-CM

## 2020-10-11 DIAGNOSIS — K469 Unspecified abdominal hernia without obstruction or gangrene: Secondary | ICD-10-CM

## 2020-10-11 LAB — CBC
HCT: 28.6 % — ABNORMAL LOW (ref 39.0–52.0)
Hemoglobin: 9 g/dL — ABNORMAL LOW (ref 13.0–17.0)
MCH: 28.4 pg (ref 26.0–34.0)
MCHC: 31.5 g/dL (ref 30.0–36.0)
MCV: 90.2 fL (ref 80.0–100.0)
Platelets: 408 10*3/uL — ABNORMAL HIGH (ref 150–400)
RBC: 3.17 MIL/uL — ABNORMAL LOW (ref 4.22–5.81)
RDW: 13.9 % (ref 11.5–15.5)
WBC: 14.9 10*3/uL — ABNORMAL HIGH (ref 4.0–10.5)
nRBC: 0 % (ref 0.0–0.2)

## 2020-10-11 LAB — COMPREHENSIVE METABOLIC PANEL
ALT: 18 U/L (ref 0–44)
AST: 15 U/L (ref 15–41)
Albumin: 2.3 g/dL — ABNORMAL LOW (ref 3.5–5.0)
Alkaline Phosphatase: 83 U/L (ref 38–126)
Anion gap: 7 (ref 5–15)
BUN: 23 mg/dL (ref 8–23)
CO2: 27 mmol/L (ref 22–32)
Calcium: 10.7 mg/dL — ABNORMAL HIGH (ref 8.9–10.3)
Chloride: 103 mmol/L (ref 98–111)
Creatinine, Ser: 1.01 mg/dL (ref 0.61–1.24)
GFR, Estimated: >60 mL/min (ref >60)
Glucose, Bld: 118 mg/dL — ABNORMAL HIGH (ref 70–99)
Potassium: 4 mmol/L (ref 3.5–5.1)
Sodium: 137 mmol/L (ref 135–145)
Total Bilirubin: 0.2 mg/dL — ABNORMAL LOW (ref 0.3–1.2)
Total Protein: 6.7 g/dL (ref 6.5–8.1)

## 2020-10-11 LAB — GLUCOSE, CAPILLARY: Glucose-Capillary: 120 mg/dL — ABNORMAL HIGH (ref 70–99)

## 2020-10-11 MED ORDER — FOLIC ACID 1 MG PO TABS
1.0000 mg | ORAL_TABLET | Freq: Every day | ORAL | Status: DC
  Administered 2020-10-11 – 2020-10-13 (×3): 1 mg via ORAL
  Filled 2020-10-11 (×2): qty 1

## 2020-10-11 MED ORDER — SORBITOL 70 % SOLN
30.0000 mL | Freq: Once | Status: AC
  Administered 2020-10-11: 14:00:00 30 mL via ORAL
  Filled 2020-10-11: qty 30

## 2020-10-11 MED ORDER — SORBITOL 70 % SOLN
30.0000 mL | Freq: Once | Status: DC
  Filled 2020-10-11: qty 30

## 2020-10-11 MED ORDER — LACTULOSE ENEMA
300.0000 mL | Freq: Once | ORAL | Status: DC
  Filled 2020-10-11: qty 300

## 2020-10-11 MED ORDER — SORBITOL 70 % SOLN
960.0000 mL | TOPICAL_OIL | Freq: Once | ORAL | Status: DC
  Filled 2020-10-11: qty 473

## 2020-10-11 MED ORDER — SODIUM CHLORIDE (PF) 0.9 % IJ SOLN
INTRAMUSCULAR | Status: AC
  Filled 2020-10-11: qty 50

## 2020-10-11 MED ORDER — SODIUM CHLORIDE 0.9 % IV SOLN: INTRAVENOUS | Status: AC

## 2020-10-11 MED ORDER — IOHEXOL 300 MG/ML  SOLN
75.0000 mL | Freq: Once | INTRAMUSCULAR | Status: AC | PRN
  Administered 2020-10-11: 16:00:00 75 mL via INTRAVENOUS

## 2020-10-11 MED ORDER — ENOXAPARIN SODIUM 40 MG/0.4ML ~~LOC~~ SOLN
40.0000 mg | SUBCUTANEOUS | Status: DC
  Filled 2020-10-11: qty 0.4

## 2020-10-11 NOTE — Progress Notes (Signed)
   10/11/20 1219  Assess: MEWS Score  Temp 98 F (36.7 C)  BP 101/65  Pulse Rate (!) 116  Resp 19  Level of Consciousness Alert  SpO2 97 %  O2 Device Room Air  Assess: MEWS Score  MEWS Temp 0  MEWS Systolic 0  MEWS Pulse 2  MEWS RR 0  MEWS LOC 0  MEWS Score 2  MEWS Score Color Yellow  Assess: if the MEWS score is Yellow or Red  Were vital signs taken at a resting state? Yes  Focused Assessment No change from prior assessment  Early Detection of Sepsis Score *See Row Information* Low  MEWS guidelines implemented *See Row Information* Yes  Notify: Charge Nurse/RN  Name of Charge Nurse/RN Notified Baxter Flattery RN  Date Charge Nurse/RN Notified 10/11/20  Time Charge Nurse/RN Notified 1245

## 2020-10-11 NOTE — Telephone Encounter (Signed)
A new pt appt has been scheduled for Mr. Fuhs to see Dr. Irene Limbo on 12/16 at 130pm for a hospital follow up. Pt is currently in the hospital.

## 2020-10-11 NOTE — Plan of Care (Signed)
  Problem: Education: Goal: Knowledge of General Education information will improve Description: Including pain rating scale, medication(s)/side effects and non-pharmacologic comfort measures Outcome: Completed/Met   Problem: Health Behavior/Discharge Planning: Goal: Ability to manage health-related needs will improve Outcome: Completed/Met   Problem: Clinical Measurements: Goal: Respiratory complications will improve Outcome: Completed/Met Goal: Cardiovascular complication will be avoided Outcome: Completed/Met

## 2020-10-11 NOTE — Progress Notes (Addendum)
[facility]  PROGRESS NOTE  Jamie Wilson FIE:[id number] DOB: [birth year] DOA: 10/02/2020 PCP: Patient, [name] Admit date - 10/02/2020   Admitting Physician Jamie Margarita, DO  Outpatient Primary MD for the patient is Patient, [name]  LOS - 9 Brief Narrative  Jamie Wilson is a 64 year old male with medical history significant for chronic urinary retention with self-catheterization, history of small bowel obstruction requiring bowel resection and colostomy remotely, with recent outpatient history of hematuria despite several rounds of outpatient antibiotics for presumed UTI who presented with worsening lower abdominal pain and flank pain and was found to have hypercalcemia with calcium of 13.5 on admission, concern for UTI started on IV ceftriaxone and concern for new bladder mass on CT imaging.  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  Patient antibiotics were changed from ceftriaxone to cefepime due to concern for potential seeding with bacteremia given retrograde ureteral irrigation.  Patient was then further broadened out to meropenem due to sepsis physiology with tachycardia, hypotension, and leukocytosis that has since slightly improved.  Blood cultures remain unremarkable.  Course also complicated by significant amount of stool/constipation in setting of IV pain control and patient declining laxative/stool softener.  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  Discussed case with on-call oncologist hold arrange outpatient follow-up very closely to discuss likely chemotherapy.  Urology will also follow-up as outpatient for his bladder cancer.   Subjective  Having lower abdominal pain.  Has been declining laxatives offered by nursing staff.  Had long conversation with him this morning about the importance of taking his laxatives we will have to discontinue his pain control.  He denies any  nausea or vomiting.  Still tolerating p.o.  A & P   Necrotic bladder mass, Path positive for infiltrating high grade urothelial carcinoma, also concern for infected mass based off urology examination during TURBT and complicated by chronic indwelling catheter.   Still having residual lower belly pain but likely being exacerbated by constipation as noted on abd xr in the setting of opioids.   -Urology recommends continuing Foley catheter and further intervention for bladder cancer, likely cystectomy to be discussed as outpatient --Discussed with Jamie Wilson on 12/13, will obtain CT chest for further staging (CXR only shows small pleural effusions, and arrange outpatient appointment this week to talk about likely chemotherapy and importance of adherence -Pain regimen: IV morphine as needed for severe pain, increase oxycodone 5-10 mg as needed moderate pain, scheduled Tylenol, closely monitor --Continue IV meropenem to cover ESBL organisms that IV cefepime will miss  ( last urine culture from 10/21 shows E.coli resistant to keflex, ciprofloxacin, and bactrim)  Sepsis secondary to infected bladder mass. Tachycardic,  Leukocytosis (downtrending), and nidus for infection likely known mass in bladder further complicated by chronic indwelling foley ( present on admission). Also have hypotension but is improving with BP 107/68  andl maintaining MAP> 65. Afebrile, no lactic acidosis, prior Urine cultures with E.coli resistant to fluroquinolones, zosyn, keflex.  -Continue maintenance fluids, goal MAP greater than 65 -Monitor blood cultures -IV meropenem for better coverage for possible esbl ( urine culture this admission non-diagnostic)  Right ureteral stone, nonobstructing. No current flank pain.  CT abdomen confirms nonobstructive -Continue Flomax   Constipation, large stool burden on abdominal x-ray and stable peristomal hernia without evidence of bowel obstruction, in setting of opioid use on abdominal  x-ray.  CT abdomen shows no fistula, abdominal x-ray shows large stool  burden with no evidence of obstruction or ileus.  Occurring in the setting of opioid regimen and poor adherence with bowel regimen -Optimize bowel regimen: Adding smog enema to ostomy site -Continue MiraLAX, monitor his ostomy output  Prior to admission hematuria with purulent discharge, concerning for possible UTI. Suspect infected bladder mass given urologic assessment with TURBT p. -IV to Meropenem  Hypercalcemia, unclear etiology, improving. Differential includes possible malignancy, dehydration/poor mobility. Vitamin D level within normal limits. Ca peak of 13 on admission, uptrending again at 10.7 status post Zometa earlier in hospitalization . pthrp wnl.  -Resumed IVF -monitor on CMP  Hyponatremia, mild, asymptomatic, resolved with IVF. -- monitor BMP  Hypophosphatemia, resolved -continue replacement and monitor  Chronic urinary retention for several years. 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 Self caths at home. Likely multifactorial etiology including bladder mass possible UTI, hypercalcemia -Continue indwelling Foley, Flomax  Normocytic anemia, currently stable with hemoglobin at 9-9.7.  Prior baseline 11-12.  Hemoglobin stable today.  No bleeding episodes.  suspect anemia of chronic disease consistent with anemia of chronic disease.  Folate level is low.  B12 within normal limits -Continue to closely monitor CBC -Start folic acid supplementation  Underweight with BMI less than 18 -Nutrition consulted, assisting with supplementation -Continue multivitamins    Family Communication  : None at bedside  Code Status : Full  Disposition Plan  :  Patient is from home. Anticipated d/c date:  2-3 days. Barriers to d/c or necessity for inpatient status:  Still on IV meropenem, monitor blood cultures, close monitoring blood pressure given still soft blood pressures, needs aggressive bowel regimen given significant  amount of constipation with stable peristomal herniation though no signs of obstruction, significantly at risk for developing obstruction (previous hospitalization October for similar) Consults  : Urology  Procedures  :    DVT Prophylaxis  : Resume Lovenox given anemia stable  MDM: The below labs and imaging reports were reviewed and summarized above.  Medication management as above.  Lab Results  Component Value Date   PLT 408 (H) 10/11/2020    Diet :  Diet Order            Diet regular Room service appropriate? Yes; Fluid consistency: Thin  Diet effective now                  Inpatient Medications Scheduled Meds: . (feeding supplement) PROSource Plus  30 mL Oral BID BM  . acetaminophen  1,000 mg Oral Q8H  . B-complex with vitamin C  1 tablet Oral Daily  . Chlorhexidine Gluconate Cloth  6 each Topical Daily  . feeding supplement  237 mL Oral TID BM  . multivitamin with minerals  1 tablet Oral Daily  . polyethylene glycol  17 g Oral BID  . senna-docusate  2 tablet Oral BID  . sorbitol, milk of mag, mineral oil, glycerin (SMOG) enema  960 mL Rectal Once  . tamsulosin  0.4 mg Oral Daily  . vitamin B-12  1,000 mcg Oral Daily   Continuous Infusions: . sodium chloride 100 mL/hr at 10/11/20 1032  . meropenem (MERREM) IV 1 g (10/11/20 1734)   PRN Meds:.acetaminophen **OR** acetaminophen, bisacodyl, morphine injection, ondansetron **OR** ondansetron (ZOFRAN) IV, oxyCODONE  Antibiotics  :   Anti-infectives (From admission, onward)   Start     Dose/Rate Route Frequency Ordered Stop   10/10/20 1600  meropenem (MERREM) 1 g in sodium chloride 0.9 % 100 mL IVPB        1 g 200  mL/hr over 30 Minutes Intravenous Every 8 hours 10/10/20 1457     10/06/20 1930  ceFEPIme (MAXIPIME) 2 g in sodium chloride 0.9 % 100 mL IVPB  Status:  Discontinued        2 g 200 mL/hr over 30 Minutes Intravenous Every 8 hours 10/06/20 1844 10/10/20 1425   10/06/20 1430  cefTRIAXone (ROCEPHIN) 1 g in  sodium chloride 0.9 % 100 mL IVPB        1 g 200 mL/hr over 30 Minutes Intravenous  Once 10/06/20 1429 10/06/20 1509   10/02/20 2200  cefTRIAXone (ROCEPHIN) 1 g in sodium chloride 0.9 % 100 mL IVPB  Status:  Discontinued        1 g 200 mL/hr over 30 Minutes Intravenous Every 24 hours 10/02/20 0437 10/06/20 1820   10/02/20 0230  cefTRIAXone (ROCEPHIN) 2 g in sodium chloride 0.9 % 100 mL IVPB        2 g 200 mL/hr over 30 Minutes Intravenous  Once 10/02/20 0226 10/02/20 0339       Objective   Vitals:   10/11/20 0500 10/11/20 1219 10/11/20 1436 10/11/20 1648  BP: 106/63 101/65 107/68 101/60  Pulse: 98 (!) 116 (!) 102 92  Resp: 17 19 18 18   Temp: 98.5 F (36.9 C) 98 F (36.7 C) 98.3 F (36.8 C) 98.4 F (36.9 C)  TempSrc: Oral Oral Oral Oral  SpO2: 100% 97% 97% 96%  Weight: 58.4 kg     Height:        SpO2: 96 % O2 Flow Rate (L/min): 2 L/min  Wt Readings from Last 3 Encounters:  10/11/20 58.4 kg  08/28/20 61.1 kg  07/20/20 68 kg     Intake/Output Summary (Last 24 hours) at 10/11/2020 1748 Last data filed at 10/11/2020 1400 Gross per 24 hour  Intake 1394.66 ml  Output 2450 ml  Net -1055.34 ml    Physical Exam:  Thin, frail male, in no distress Ostomy bag in place with no liquid stool present but gas obviously present, abdomen soft, increased  Distension, peristomal hernia present and non-tender on exam,  mildly tender in lower quadrant, diminished bowel sounds, no rebound tenderness or guarding Foley catheter in place draining clear serosanguionus urine Slightly tachycardic, normal rhythm, no appreciable murmurs Normal respiratory effort on room air      I have personally reviewed the following:   Data Reviewed:  CBC Recent Labs  Lab 10/07/20 0600 10/08/20 0620 10/09/20 0537 10/10/20 0511 10/11/20 0520  WBC 15.7* 13.8* 13.7* 15.3* 14.9*  HGB 9.2* 8.5* 8.5* 8.9* 9.0*  HCT 29.7* 27.2* 27.6* 28.9* 28.6*  PLT 340 314 339 388 408*  MCV 92.2 92.5 92.9  90.9 90.2  MCH 28.6 28.9 28.6 28.0 28.4  MCHC 31.0 31.3 30.8 30.8 31.5  RDW 14.1 14.1 13.8 13.8 13.9    Chemistries  Recent Labs  Lab 10/07/20 0600 10/08/20 0620 10/09/20 0537 10/10/20 0511 10/11/20 0520  NA 137 136 137 135 137  K 4.2 3.6 3.8 4.2 4.0  CL 107 106 105 102 103  CO2 23 23 25 25 27   GLUCOSE 97 121* 114* 116* 118*  BUN 13 14 17 17 23   CREATININE 0.89 0.99 0.96 0.90 1.01  CALCIUM 8.8* 9.4 9.8 9.7 10.7*  AST 14* 16 15 18 15   ALT 16 18 19 19 18   ALKPHOS 57 69 69 74 83  BILITOT 0.3 0.3 0.1* 0.2* 0.2*   ------------------------------------------------------------------------------------------------------------------ No results for input(s): CHOL, HDL, LDLCALC, TRIG, CHOLHDL, LDLDIRECT in  the last 72 hours.  Lab Results  Component Value Date   HGBA1C 5.7 (H) 08/19/2020   ------------------------------------------------------------------------------------------------------------------ No results for input(s): TSH, T4TOTAL, T3FREE, THYROIDAB in the last 72 hours.  Invalid input(s): FREET3 ------------------------------------------------------------------------------------------------------------------ Recent Labs    10/09/20 0537  VITAMINB12 502  FOLATE 5.1*  FERRITIN 226  TIBC 162*  IRON 15*    Coagulation profile No results for input(s): INR, PROTIME in the last 168 hours.  No results for input(s): DDIMER in the last 72 hours.  Cardiac Enzymes No results for input(s): CKMB, TROPONINI, MYOGLOBIN in the last 168 hours.  Invalid input(s): CK ------------------------------------------------------------------------------------------------------------------ No results found for: BNP  Micro Results Recent Results (from the past 240 hour(s))  Urine culture     Status: Abnormal   Collection Time: 10/02/20  2:27 AM   Specimen: Urine, Clean Catch  Result Value Ref Range Status   Specimen Description   Final    URINE, CLEAN CATCH Performed at [facility], [place] [address], [place], [place] [address]    Special Requests   Final    NONE Performed at [facility], [place] [address], [place], [place] [address]    Culture MULTIPLE SPECIES PRESENT, SUGGEST RECOLLECTION (A)  Final   Report Status 10/03/2020 FINAL  Final  Resp Panel by RT-PCR (Flu A&B, Covid) Nasopharyngeal Swab     Status: None   Collection Time: 10/02/20  3:03 AM   Specimen: Nasopharyngeal Swab; Nasopharyngeal(NP) swabs in vial transport medium  Result Value Ref Range Status   SARS Coronavirus 2 by RT PCR NEGATIVE NEGATIVE Final    Comment: (NOTE) SARS-CoV-2 target nucleic acids are NOT DETECTED.  The SARS-CoV-2 RNA is generally detectable in upper respiratory specimens during the acute phase of infection. The lowest concentration of SARS-CoV-2 viral copies this assay can detect is 138 copies/mL. A negative result does not preclude SARS-Cov-2 infection and should not be used as the sole basis for treatment or other patient management decisions. A negative result may occur with  improper specimen collection/handling, submission of specimen other than nasopharyngeal swab, presence of viral mutation(s) within the areas targeted by this assay, and inadequate number of viral copies(<138 copies/mL). A negative result must be combined with clinical observations, patient history, and epidemiological information. The expected result is Negative.  Fact Sheet for Patients:  EntrepreneurPulse.com.au  Fact Sheet for Healthcare Providers:  IncredibleEmployment.be  This test is no t yet approved or cleared by the [place] FDA and  has been authorized for detection and/or diagnosis of SARS-CoV-2 by FDA under an Emergency Use Authorization (EUA). This EUA will remain  in effect (meaning this test can be used) for the duration of the COVID-19 declaration under Section 564(b)(1) of the Act, 21 U.S.C.section  360bbb-3(b)(1), unless the authorization is terminated  or revoked sooner.       Influenza A by PCR NEGATIVE NEGATIVE Final   Influenza B by PCR NEGATIVE NEGATIVE Final    Comment: (NOTE) The Xpert Xpress SARS-CoV-2/FLU/RSV plus assay is intended as an aid in the diagnosis of influenza from Nasopharyngeal swab specimens and should not be used as a sole basis for treatment. Nasal washings and aspirates are unacceptable for Xpert Xpress SARS-CoV-2/FLU/RSV testing.  Fact Sheet for Patients: EntrepreneurPulse.com.au  Fact Sheet for Healthcare Providers: IncredibleEmployment.be  This test is not yet approved or cleared by the [place] FDA and has been authorized for detection and/or diagnosis of SARS-CoV-2 by FDA under an Emergency Use Authorization (EUA). This EUA  will remain in effect (meaning this test can be used) for the duration of the COVID-19 declaration under Section 564(b)(1) of the Act, 21 U.S.C. section 360bbb-3(b)(1), unless the authorization is terminated or revoked.  Performed at [facility], [place] [address] [place], [place] [address]   Culture, blood (routine x 2)     Status: None (Preliminary result)   Collection Time: 10/07/20  8:19 AM   Specimen: BLOOD  Result Value Ref Range Status   Specimen Description   Final    BLOOD RIGHT ANTECUBITAL Performed at [place] [address], [place], [place] [address]    Special Requests   Final    BOTTLES DRAWN AEROBIC AND ANAEROBIC Blood Culture adequate volume Performed at [facility] [address], [place], [place] [address]    Culture   Final    NO GROWTH 4 DAYS Performed at [facility], [place] [address], [place], [place] [address]    Report Status PENDING  Incomplete  Culture, blood (routine x 2)     Status: None (Preliminary result)   Collection Time: 10/07/20  8:25 AM   Specimen: BLOOD  Result Value  Ref Range Status   Specimen Description   Final    BLOOD BLOOD RIGHT FOREARM Performed at [facility] [address], [place], [place] [address]    Special Requests   Final    BOTTLES DRAWN AEROBIC ONLY Blood Culture adequate volume Performed at [facility] [address], [place], [place] [address]    Culture   Final    NO GROWTH 4 DAYS Performed at [facility], [place] [address], [place], [place] [address]    Report Status PENDING  Incomplete    Radiology Reports CT ABDOMEN PELVIS W WO CONTRAST  Result Date: 10/08/2020 CLINICAL DATA:  Abdominal pain, bladder masses. Potential bladder enteric fistula EXAM: CT ABDOMEN AND PELVIS WITHOUT AND WITH CONTRAST TECHNIQUE: Multidetector CT imaging of the abdomen and pelvis was performed following the standard protocol before and following the bolus administration of intravenous contrast. CONTRAST:  155mL OMNIPAQUE IOHEXOL 300 MG/ML  SOLN COMPARISON:  None. FINDINGS: Lower chest: Bilateral small effusions.  Mild passive atelectasis. Hepatobiliary: Multiple benign hepatic cysts. Cysts are large measuring up to 5 cm. Gallbladder collapsed. Pancreas: Pancreas is normal. No ductal dilatation. No pancreatic inflammation. Spleen: Normal spleen Adrenals/urinary tract: Coarse RIGHT renal calculi. Small LEFT renal calculus. Ureteral calculi. No bladder calculi. No enhancing renal cortical lesion. Cortical scarring of the RIGHT kidney. Benign cyst of the LEFT kidney. Irregular thickening of the bladder wall with enhancement. Gas and nonenhancing material centrally within the bladder. Foley catheter with retention bulb in the bladder. Stomach/Bowel: Stomach, small bowel cecum normal. Appendix not identified. LEFT abdominal wall colostomy. Partial LEFT colon colectomy. Stump at the sigmoid colon. Rectum appears normal. Vascular/Lymphatic: Abdominal aorta is normal caliber with atherosclerotic calcification. There is no  retroperitoneal or periportal lymphadenopathy. No pelvic lymphadenopathy. Reproductive: Prostate unremarkable Other: No free fluid or free air Musculoskeletal: No aggressive osseous lesion IMPRESSION: 1. Irregular thickening of the bladder wall with enhancement. Gas and nonenhancing material centrally within the bladder. Bladder neoplasm is certainly a consideration. 2. Bilateral nephrolithiasis. No ureterolithiasis or obstructive uropathy. 3. 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 4. Aortic Atherosclerosis (ICD10-I70.0). Electronically Signed   By: Suzy Bouchard M.D.   On: 10/08/2020 04:19   DG Chest 2 View  Result Date: 10/10/2020 CLINICAL DATA:  Bladder cancer. EXAM:  CHEST - 2 VIEW COMPARISON:  CT abdomen/pelvis 10/08/2020. Prior chest radiograph 08/16/2020. FINDINGS: Heart size within normal limits. Aortic atherosclerosis. No appreciable airspace consolidation or pulmonary nodules. Biapical pleuroparenchymal scarring. Possible trace right pleural effusion. No evidence of pneumothorax. No acute bony abnormality. IMPRESSION: No appreciable airspace consolidation or pulmonary nodules. Possible trace right pleural effusion. Aortic Atherosclerosis (ICD10-I70.0). Electronically Signed   By: Kellie Simmering DO   On: 10/10/2020 09:32   DG Abd 1 View  Result Date: 10/03/2020 CLINICAL DATA:  Right ureteral stone EXAM: ABDOMEN - 1 VIEW COMPARISON:  10/02/2020 FINDINGS: Gaseous distension large and small bowel loops within the abdomen. Moderate volume stool within the right hemicolon. Numerous calcifications project over the right renal shadow. Known proximal right ureteral calculus is not definitively seen. IMPRESSION: 1. Numerous calcifications project over the right renal shadow. 2. Known proximal right ureteral calculus is not definitively seen. 3. Air-filled loops of large and small bowel suggesting ileus. Electronically Signed   By: Davina Poke D.O.   On: 10/03/2020 08:32   DG Abd Portable  1V  Result Date: 10/11/2020 CLINICAL DATA:  Abdominal pain EXAM: PORTABLE ABDOMEN - 1 VIEW COMPARISON:  10/09/2020 FINDINGS: Scattered large and small bowel gas is noted. Ostomy is noted on the left similar to that seen on prior exam. 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 No obstructive changes are seen. Persistent fecal material is noted within the colon slightly increased when compared with the prior exam. No obstructive changes are seen. IMPRESSION: Slight increase in fecal material within the colon. No definitive obstructive changes seen. Electronically Signed   By: Inez Catalina M.D.   On: 10/11/2020 14:38   DG Abd Portable 1V  Result Date: 10/09/2020 CLINICAL DATA:  Abdominal pain. EXAM: PORTABLE ABDOMEN - 1 VIEW COMPARISON:  October 03, 2020. FINDINGS: No abnormal bowel dilatation is noted. Large amount of stool is seen throughout the colon. Right nephrolithiasis is noted. IMPRESSION: Large stool burden is noted. No evidence of bowel obstruction or ileus. Electronically Signed   By: Marijo Conception M.D.   On: 10/09/2020 12:10   CT Renal Stone Study  Result Date: 10/02/2020 CLINICAL DATA:  Bilateral flank pain EXAM: CT ABDOMEN AND PELVIS WITHOUT CONTRAST TECHNIQUE: Multidetector CT imaging of the abdomen and pelvis was performed following the standard protocol without IV contrast. COMPARISON:  None. FINDINGS: Lower chest: The visualized heart size within normal limits. No pericardial fluid/thickening. No hiatal hernia. The visualized portions of the lungs are clear. Hepatobiliary: Multiple low-density lesions are seen throughout the liver parenchyma the largest in the right hepatic dome measures 5.5 cm. No evidence of calcified gallstones or biliary ductal dilatation. Pancreas:  Unremarkable.  No surrounding inflammatory changes. Spleen: Normal in size. Although limited due to the lack of intravenous contrast, normal in appearance. Adrenals/Urinary Tract: Both adrenal glands appear  normal there is mild right-sided pelviectasis with stranding changes. There are multiple coarse calcifications throughout the right kidney the largest measuring 8 mm within the upper pole. Within the proximal right ureter there is a 3 mm calculus present. There is calcifications seen in the lower pole the left kidney. Again noted within the bladder is heterogeneous soft tissue masslike area and wall thickening extending superiorly which appears to have a fistulous connection to the overlying small bowel and colonic loops. There now appears to be small foci of air and debris. Stomach/Bowel: The stomach and small bowel are unremarkable. There is scattered colonic diverticula with a moderate amount of colonic stool  present. A left lower quadrant ostomy is present. Vascular/Lymphatic: There are no enlarged abdominal or pelvic lymph nodes. Scattered aortic atherosclerotic calcifications are seen without aneurysmal dilatation. Reproductive: The patient is status post hysterectomy. No adnexal masses or collections seen. Other: No evidence of abdominal wall mass or hernia. Musculoskeletal: No acute or significant osseous findings. 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 Mild right pelviectasis with a proximal 3 mm ureteral calculi. Multiple bilateral non-obstructing renal calculi. Aortic Atherosclerosis (ICD10-I70.0). Electronically Signed   By: Prudencio Pair M.D.   On: 10/02/2020 03:46     Time Spent in minutes  30     Desiree Hane M.D on 10/11/2020 at 5:48 PM  To page go to [email or web address] - password [facility]

## 2020-10-11 NOTE — Progress Notes (Signed)
PT Cancellation Note  Patient Details Name: Jamie Wilson MRN: [id number] DOB: June 17, 1956   Cancelled Treatment:    Reason Eval/Treat Not Completed: PT screened, no needs identified, will sign off Pt refusing physical therapy this hospitalization.  PT received new order.  Pt declines to participate again.  Pt states he will mobilize with nursing and request PT to come back when he's ready.  Attempted to encourage pt and discuss importance of mobilizing however pt telling multiple stories and not allowing therapist to speak.  Please only re-order if pt requests since he has been refusing.  PT to sign off at this time.   Iretta Mangrum,KATHrine E 10/11/2020, 2:53 PM Jannette Spanner PT, DPT Acute Rehabilitation Services Pager: [phone] Office: [phone]

## 2020-10-11 NOTE — Progress Notes (Signed)
Patient ID: Jamie Wilson, male   DOB: 1956/03/18, 64 y.o.   MRN: [id number]  5 Days Post-Op  Subjective: Jamie Wilson is improved following the TURBT.  He has poorly differentiated muscle invasive bladder cancer.  The urine is clear with minimal debris in the foley and he is doing better but still has some pain.  The right ureteral stone was no longer apparent on the CT.   ROS:  Review of Systems  Gastrointestinal: Positive for abdominal pain and diarrhea.  All other systems reviewed and are negative.   Anti-infectives: Anti-infectives (From admission, onward)   Start     Dose/Rate Route Frequency Ordered Stop   10/10/20 1600  meropenem (MERREM) 1 g in sodium chloride 0.9 % 100 mL IVPB        1 g 200 mL/hr over 30 Minutes Intravenous Every 8 hours 10/10/20 1457     10/06/20 1930  ceFEPIme (MAXIPIME) 2 g in sodium chloride 0.9 % 100 mL IVPB  Status:  Discontinued        2 g 200 mL/hr over 30 Minutes Intravenous Every 8 hours 10/06/20 1844 10/10/20 1425   10/06/20 1430  cefTRIAXone (ROCEPHIN) 1 g in sodium chloride 0.9 % 100 mL IVPB        1 g 200 mL/hr over 30 Minutes Intravenous  Once 10/06/20 1429 10/06/20 1509   10/02/20 2200  cefTRIAXone (ROCEPHIN) 1 g in sodium chloride 0.9 % 100 mL IVPB  Status:  Discontinued        1 g 200 mL/hr over 30 Minutes Intravenous Every 24 hours 10/02/20 0437 10/06/20 1820   10/02/20 0230  cefTRIAXone (ROCEPHIN) 2 g in sodium chloride 0.9 % 100 mL IVPB        2 g 200 mL/hr over 30 Minutes Intravenous  Once 10/02/20 0226 10/02/20 0339      Current Facility-Administered Medications  Medication Dose Route Frequency Provider Last Rate Last Admin  . (feeding supplement) PROSource Plus liquid 30 mL  30 mL Oral BID BM Irine Seal, MD   30 mL at 10/10/20 2100  . acetaminophen (TYLENOL) tablet 650 mg  650 mg Oral Q6H PRN Irine Seal, MD   650 mg at 10/08/20 9379   Or  . acetaminophen (TYLENOL) suppository 650 mg  650 mg Rectal Q6H PRN Irine Seal, MD      .  acetaminophen (TYLENOL) tablet 1,000 mg  1,000 mg Oral Q8H Irine Seal, MD   1,000 mg at 10/11/20 0240  . B-complex with vitamin C tablet 1 tablet  1 tablet Oral Daily Irine Seal, MD   1 tablet at 10/10/20 [phone]  . bisacodyl (DULCOLAX) EC tablet 5 mg  5 mg Oral Daily PRN Irine Seal, MD      . Chlorhexidine Gluconate Cloth 2 % PADS 6 each  6 each Topical Daily Irine Seal, MD   6 each at 10/10/20 1121  . feeding supplement (ENSURE ENLIVE / ENSURE PLUS) liquid 237 mL  237 mL Oral TID BM Oretha Milch D, MD   237 mL at 10/10/20 2103  . meropenem (MERREM) 1 g in sodium chloride 0.9 % 100 mL IVPB  1 g Intravenous Q8H Luiz Ochoa, RPH 200 mL/hr at 10/10/20 2343 1 g at 10/10/20 2343  . morphine 2 MG/ML injection 2 mg  2 mg Intravenous Q4H PRN Irine Seal, MD   2 mg at 10/11/20 0339  . multivitamin with minerals tablet 1 tablet  1 tablet Oral Daily Irine Seal, MD   1 tablet  at 10/10/20 0836  . ondansetron (ZOFRAN) tablet 4 mg  4 mg Oral Q6H PRN Irine Seal, MD       Or  . ondansetron [facility]) injection 4 mg  4 mg Intravenous Q6H PRN Irine Seal, MD      . oxyCODONE (Oxy IR/ROXICODONE) immediate release tablet 5-10 mg  5-10 mg Oral Q4H PRN Irine Seal, MD   10 mg at 10/11/20 7673  . polyethylene glycol (MIRALAX / GLYCOLAX) packet 17 g  17 g Oral BID Irine Seal, MD   17 g at 10/10/20 2101  . senna-docusate (Senokot-S) tablet 2 tablet  2 tablet Oral BID Desiree Hane, MD   2 tablet at 10/10/20 0836  . tamsulosin (FLOMAX) capsule 0.4 mg  0.4 mg Oral Daily Irine Seal, MD   0.4 mg at 10/10/20 0835  . vitamin B-12 (CYANOCOBALAMIN) tablet 1,000 mcg  1,000 mcg Oral Daily Irine Seal, MD   1,000 mcg at 10/10/20 4193     Objective: Vital signs in last 24 hours: Temp:  [98.5 F (36.9 C)-98.8 F (37.1 C)] 98.5 F (36.9 C) (12/14 0500) Pulse Rate:  [98-102] 98 (12/14 0500) Resp:  [15-18] 17 (12/14 0500) BP: (91-106)/(52-68) 106/63 (12/14 0500) SpO2:  [92 %-100 %] 100 % (12/14 0500) Weight:  [58.4  kg] 58.4 kg (12/14 0500)  Intake/Output from previous day: 12/13 0701 - 12/14 0700 In: 815 [P.O.:480; IV Piggyback:300] Out: 3500 [Urine:3500] Intake/Output this shift: No intake/output data recorded.   Physical Exam  Lab Results:  Recent Labs    10/10/20 0511 10/11/20 0520  WBC 15.3* 14.9*  HGB 8.9* 9.0*  HCT 28.9* 28.6*  PLT 388 408*   BMET Recent Labs    10/10/20 0511 10/11/20 0520  NA 135 137  K 4.2 4.0  CL 102 103  CO2 25 27  GLUCOSE 116* 118*  BUN 17 23  CREATININE 0.90 1.01  CALCIUM 9.7 10.7*   PT/INR No results for input(s): LABPROT, INR in the last 72 hours. ABG No results for input(s): PHART, HCO3 in the last 72 hours.  Invalid input(s): PCO2, PO2  Studies/Results: DG Chest 2 View  Result Date: 10/10/2020 CLINICAL DATA:  Bladder cancer. EXAM: CHEST - 2 VIEW COMPARISON:  CT abdomen/pelvis 10/08/2020. Prior chest radiograph 08/16/2020. FINDINGS: Heart size within normal limits. Aortic atherosclerosis. No appreciable airspace consolidation or pulmonary nodules. Biapical pleuroparenchymal scarring. Possible trace right pleural effusion. No evidence of pneumothorax. No acute bony abnormality. IMPRESSION: No appreciable airspace consolidation or pulmonary nodules. Possible trace right pleural effusion. Aortic Atherosclerosis (ICD10-I70.0). Electronically Signed   By: Kellie Simmering DO   On: 10/10/2020 09:32   DG Abd Portable 1V  Result Date: 10/09/2020 CLINICAL DATA:  Abdominal pain. EXAM: PORTABLE ABDOMEN - 1 VIEW COMPARISON:  October 03, 2020. FINDINGS: No abnormal bowel dilatation is noted. Large amount of stool is seen throughout the colon. Right nephrolithiasis is noted. IMPRESSION: Large stool burden is noted. No evidence of bowel obstruction or ileus. Electronically Signed   By: Marijo Conception M.D.   On: 10/09/2020 12:10   PAthology: FINAL MICROSCOPIC DIAGNOSIS:   A. BLADDER, MASS, TRANSURETHRAL RESECTION:  Infiltrating high grade urothelial  carcinoma  With poorly differentiated (50%), Sarcomatoid (40%) and squamous (10%)  components  The carcinoma invades muscularis propria (detrusor muscle)  Urothelial carcinoma in situ is present   B. BLADDER, TUMOR BASE, TRANSURETHRAL RESECTION:  Infiltrating high grade poorly differentiated urothelial carcinoma  The carcinoma invades muscularis propria (detrusor muscle)   Comment: the Sarcomatoid  mesenchymal component is composed of  nonspecific malignant myxoid spindle cells.  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  He is improved s/p debulking and the foley is draining well with clear urine.   He will need further treatment with possible neoadjuvant chemo and cystectomy with diversion.    Urolithiasis with right ureteral stone.   The stone was no longer present on the recent CT.       LOS: 9 days    Irine Seal 10/11/2020 [phone]

## 2020-10-11 NOTE — Plan of Care (Signed)

## 2020-10-12 DIAGNOSIS — E43 Unspecified severe protein-calorie malnutrition: Secondary | ICD-10-CM

## 2020-10-12 LAB — CULTURE, BLOOD (ROUTINE X 2)
Culture: NO GROWTH
Culture: NO GROWTH
Special Requests: ADEQUATE
Special Requests: ADEQUATE

## 2020-10-12 LAB — COMPREHENSIVE METABOLIC PANEL
ALT: 17 U/L (ref 0–44)
AST: 15 U/L (ref 15–41)
Albumin: 2.1 g/dL — ABNORMAL LOW (ref 3.5–5.0)
Alkaline Phosphatase: 84 U/L (ref 38–126)
Anion gap: 7 (ref 5–15)
BUN: 22 mg/dL (ref 8–23)
CO2: 26 mmol/L (ref 22–32)
Calcium: 9.9 mg/dL (ref 8.9–10.3)
Chloride: 103 mmol/L (ref 98–111)
Creatinine, Ser: 1 mg/dL (ref 0.61–1.24)
GFR, Estimated: >60 mL/min (ref >60)
Glucose, Bld: 110 mg/dL — ABNORMAL HIGH (ref 70–99)
Potassium: 3.9 mmol/L (ref 3.5–5.1)
Sodium: 136 mmol/L (ref 135–145)
Total Bilirubin: 0.2 mg/dL — ABNORMAL LOW (ref 0.3–1.2)
Total Protein: 6.4 g/dL — ABNORMAL LOW (ref 6.5–8.1)

## 2020-10-12 LAB — CBC
HCT: 27.2 % — ABNORMAL LOW (ref 39.0–52.0)
Hemoglobin: 8.5 g/dL — ABNORMAL LOW (ref 13.0–17.0)
MCH: 28.1 pg (ref 26.0–34.0)
MCHC: 31.3 g/dL (ref 30.0–36.0)
MCV: 90.1 fL (ref 80.0–100.0)
Platelets: 374 10*3/uL (ref 150–400)
RBC: 3.02 MIL/uL — ABNORMAL LOW (ref 4.22–5.81)
RDW: 13.9 % (ref 11.5–15.5)
WBC: 13.6 10*3/uL — ABNORMAL HIGH (ref 4.0–10.5)
nRBC: 0 % (ref 0.0–0.2)

## 2020-10-12 LAB — GLUCOSE, CAPILLARY: Glucose-Capillary: 95 mg/dL (ref 70–99)

## 2020-10-12 MED ORDER — MORPHINE SULFATE (PF) 2 MG/ML IV SOLN
2.0000 mg | INTRAVENOUS | Status: DC | PRN
  Administered 2020-10-12 – 2020-10-13 (×7): 2 mg via INTRAVENOUS
  Filled 2020-10-12 (×7): qty 1

## 2020-10-12 NOTE — Progress Notes (Signed)
PROGRESS NOTE  Arcadio Cope ZHY:[id number] DOB: 1956-03-04 DOA: 10/02/2020 PCP: Patient, [name]   LOS: 10 days   Brief narrative: Mr. Fairhurst is a 64 year old male with medical history significant for chronic urinary retention with self-catheterization, history of small bowel obstruction requiring bowel resection and colostomy remotely, with recent outpatient history of hematuria despite several rounds of outpatient antibiotics for presumed UTI who presented to hospital with worsening lower abdominal pain and flank pain and was found to have hypercalcemia with calcium of 13.5 on admission, concern for UTI started on IV ceftriaxone and concern for new bladder mass on CT imaging.  Patient underwent transurethral resection of large bladder tumor and was found to have necrotic bladder tumor on12/9.  Patient antibiotics were changed from ceftriaxone to cefepime due to concern for potential seeding with bacteremia given retrograde ureteral irrigation.  Patient was then further broadened out to meropenem due to sepsis physiology with tachycardia, hypotension, and leukocytosis that has since improved.  Blood cultures remain unremarkable.  Course also complicated by significant amount of stool/constipation in setting of IV pain control and patient declining laxative/stool softener.Pathology returned positive for infiltrating high-grade urothelial carcinoma.    Assessment/Plan:  Principal Problem:   Acute lower UTI Active Problems:   Hypercalcemia   Acute urinary retention   Mass of bladder   Malnutrition (HCC)   Sepsis (HCC)   Protein-calorie malnutrition, severe   Necrotic bladder mass, positive for infiltrating high grade urothelial carcinoma, also concern for infected mass based off urology examination during TURBT and complicated by chronic indwelling catheter.    Communicated with urology again today.  Will need outpatient follow-up.  He will however need to be continued on Foley catheter on  discharge.  Communicated with Dr. Irene Limbo as well.  Patient will need outpatient follow-up prior to chemotherapy.  Patient is on IV meropenem to cover ESBL but no positive recent cultures.  Will discontinue antibiotic at this time.  Sepsis secondary to infected bladder mass.   Improved at this time.  No recent positive cultures.  Previous urine cultures with E.coli resistant to fluroquinolones, zosyn, keflex.  Will discontinue meropenem at this time.  Right ureteral stone, nonobstructing.  Continue Flomax.  Constipation,  CT abdomen showed no fistula, abdominal x-ray shows large stool burden with no evidence of obstruction or ileus.  Likely secondary to use of opiates.  Minimize opiates.  Continue MiraLAX.  Enema as needed.  Hypercalcemia, unclear etiology, improving.  Received Zometa x1.  Could be secondary to dehydration, malignancy.  Vitamin D level within normal limits.  Proved at this time with IV fluids.  Hyponatremia,Hypophosphatemia, resolved  Chronic urinary retention for several years. Reports history of prostate enlargement.  Self cath at home.  Will need continue indwelling Foley catheter and Flomax on discharge.    Normocytic anemia, currently stable.  Anemia of chronic disease.  Hemoglobin currently at 8.5. Prior baseline 11-12.    Continue folic acid which is low.  No gross hematuria at this time.  Severe protein calorie malnutrition.  Present on admission.  Dietitian on board.  Continue supplementation.   DVT prophylaxis: enoxaparin (LOVENOX) injection 40 mg Start: 10/11/20 2200 Place and maintain sequential compression device Start: 10/08/20 1606 Place TED hose Start: 10/02/20 1000   Code Status: Full code  Family Communication: None  Status is: Inpatient  Remains inpatient appropriate because:IV treatments appropriate due to intensity of illness or inability to take PO, Inpatient level of care appropriate due to severity of illness and Needs irrigation of  Foley  catheter on discharge.   Dispo: The patient is from: Home              Anticipated d/c is to: Home for Foley catheter management.              Anticipated d/c date is: 1 day              Patient currently is medically stable to d/c.   Consultants:  Urology,   oncology  Procedures:  TURBT with biopsy  Antibiotics:  . None currently  Anti-infectives (From admission, onward)   Start     Dose/Rate Route Frequency Ordered Stop   10/10/20 1600  meropenem (MERREM) 1 g in sodium chloride 0.9 % 100 mL IVPB  Status:  Discontinued        1 g 200 mL/hr over 30 Minutes Intravenous Every 8 hours 10/10/20 1457 10/12/20 1152   10/06/20 1930  ceFEPIme (MAXIPIME) 2 g in sodium chloride 0.9 % 100 mL IVPB  Status:  Discontinued        2 g 200 mL/hr over 30 Minutes Intravenous Every 8 hours 10/06/20 1844 10/10/20 1425   10/06/20 1430  cefTRIAXone (ROCEPHIN) 1 g in sodium chloride 0.9 % 100 mL IVPB        1 g 200 mL/hr over 30 Minutes Intravenous  Once 10/06/20 1429 10/06/20 1509   10/02/20 2200  cefTRIAXone (ROCEPHIN) 1 g in sodium chloride 0.9 % 100 mL IVPB  Status:  Discontinued        1 g 200 mL/hr over 30 Minutes Intravenous Every 24 hours 10/02/20 0437 10/06/20 1820   10/02/20 0230  cefTRIAXone (ROCEPHIN) 2 g in sodium chloride 0.9 % 100 mL IVPB        2 g 200 mL/hr over 30 Minutes Intravenous  Once 10/02/20 0226 10/02/20 0339     Subjective: Today, patient was seen and examined at bedside.  Complains of burning urination.  Objective: Vitals:   10/12/20 0953 10/12/20 1212  BP: 109/75 118/64  Pulse: 85 96  Resp: 16 17  Temp: 98.7 F (37.1 C) 97.9 F (36.6 C)  SpO2: 96% 96%    Intake/Output Summary (Last 24 hours) at 10/12/2020 1547 Last data filed at 10/12/2020 1221 Gross per 24 hour  Intake 2502.02 ml  Output 3650 ml  Net -1147.98 ml   Filed Weights   10/09/20 0500 10/11/20 0500 10/12/20 0500  Weight: 63.6 kg 58.4 kg 58.1 kg   Body mass index is 16.9 kg/m.    Physical Exam: GENERAL: Patient is alert awake and oriented. Not in obvious distress.  Thinly built, HENT: No scleral pallor or icterus. Pupils equally reactive to light. Oral mucosa is moist NECK: is supple, no gross swelling noted. CHEST: Clear to auscultation. No crackles or wheezes.  Diminished breath sounds bilaterally. CVS: S1 and S2 heard, no murmur. Regular rate and rhythm.  ABDOMEN: Soft, non-tender, bowel sounds are present.  Foley catheter in place, colostomy in place.  Paraostial hernia.  Mild distention.   EXTREMITIES: No edema. CNS: Cranial nerves are intact. No focal motor deficits. SKIN: warm and dry without rashes.  Data Review: I have personally reviewed the following laboratory data and studies,  CBC: Recent Labs  Lab 10/08/20 0620 10/09/20 0537 10/10/20 0511 10/11/20 0520 10/12/20 0611  WBC 13.8* 13.7* 15.3* 14.9* 13.6*  HGB 8.5* 8.5* 8.9* 9.0* 8.5*  HCT 27.2* 27.6* 28.9* 28.6* 27.2*  MCV 92.5 92.9 90.9 90.2 90.1  PLT 314 339 388 408*  224   Basic Metabolic Panel: Recent Labs  Lab 10/08/20 0620 10/09/20 0537 10/10/20 0511 10/11/20 0520 10/12/20 0611  NA 136 137 135 137 136  K 3.6 3.8 4.2 4.0 3.9  CL 106 105 102 103 103  CO2 23 25 25 27 26   GLUCOSE 121* 114* 116* 118* 110*  BUN 14 17 17 23 22   CREATININE 0.99 0.96 0.90 1.01 1.00  CALCIUM 9.4 9.8 9.7 10.7* 9.9   Liver Function Tests: Recent Labs  Lab 10/08/20 0620 10/09/20 0537 10/10/20 0511 10/11/20 0520 10/12/20 0611  AST 16 15 18 15 15   ALT 18 19 19 18 17   ALKPHOS 69 69 74 83 84  BILITOT 0.3 0.1* 0.2* 0.2* 0.2*  PROT 5.5* 5.6* 6.3* 6.7 6.4*  ALBUMIN 1.9* 1.8* 2.0* 2.3* 2.1*   No results for input(s): LIPASE, AMYLASE in the last 168 hours. No results for input(s): AMMONIA in the last 168 hours. Cardiac Enzymes: No results for input(s): CKTOTAL, CKMB, CKMBINDEX, TROPONINI in the last 168 hours. BNP (last 3 results) No results for input(s): BNP in the last 8760 hours.  ProBNP  (last 3 results) No results for input(s): PROBNP in the last 8760 hours.  CBG: Recent Labs  Lab 10/06/20 0724 10/07/20 0751 10/08/20 0745 10/11/20 0748 10/12/20 0733  GLUCAP 81 100* 85 120* 95   Recent Results (from the past 240 hour(s))  Culture, blood (routine x 2)     Status: None   Collection Time: 10/07/20  8:19 AM   Specimen: BLOOD  Result Value Ref Range Status   Specimen Description   Final    BLOOD RIGHT ANTECUBITAL Performed at [facility] [address], [place], [place] [address]    Special Requests   Final    BOTTLES DRAWN AEROBIC AND ANAEROBIC Blood Culture adequate volume Performed at [facility] [address], [place], [place] [address]    Culture   Final    NO GROWTH 5 DAYS Performed at [facility], [place] [address], [place], [place] [address]    Report Status 10/12/2020 FINAL  Final  Culture, blood (routine x 2)     Status: None   Collection Time: 10/07/20  8:25 AM   Specimen: BLOOD  Result Value Ref Range Status   Specimen Description   Final    BLOOD BLOOD RIGHT FOREARM Performed at [facility] [address], [place], [place] [address]    Special Requests   Final    BOTTLES DRAWN AEROBIC ONLY Blood Culture adequate volume Performed at [facility] [address], [place], [place] [address]    Culture   Final    NO GROWTH 5 DAYS Performed at [facility], [place] [address], [place], [place] [address]    Report Status 10/12/2020 FINAL  Final     Studies: CT CHEST W CONTRAST  Result Date: 10/11/2020 CLINICAL DATA:  Bladder cancer.  Staging chest CT. EXAM: CT CHEST WITH CONTRAST TECHNIQUE: Multidetector CT imaging of the chest was performed during intravenous contrast administration. CONTRAST:  54mL OMNIPAQUE IOHEXOL 300 MG/ML  SOLN COMPARISON:  None. FINDINGS: Cardiovascular: The heart is normal in size. Small amount of pericardial fluid but no overt  effusion.The aorta is normal in caliber. No dissection. Stable atherosclerotic calcifications. Stable branch vessel calcifications. The pulmonary arteries are unremarkable. Mediastinum/Nodes: Borderline enlarged mediastinal and hilar lymph nodes. Left hilar node on image 65/2 measures 11 mm. Right hilar node on image number 68/2  measures 9.5 mm. Right-sided subcarinal lymph node on image number 92/2 measures 10 mm. Right infrahilar node on image 85/2 measures 7 mm. The esophagus is grossly normal.  Thyroid gland is unremarkable. Lungs/Pleura: Emphysematous changes are noted along with pulmonary scarring. No infiltrates or effusions. Streaky bibasilar atelectasis. No worrisome pulmonary lesions and no worrisome pulmonary nodules to suggest metastatic disease. Mild tracheomegaly. There is some debris in the trachea. This is likely mucous. I do not see an obvious mass. Upper Abdomen: Stable hepatic cysts and left renal cyst. Musculoskeletal: No chest wall mass, supraclavicular or axillary adenopathy. The bony thorax is intact.  No worrisome bone lesions. IMPRESSION: 1. Borderline enlarged mediastinal and hilar lymph nodes. These are likely related to the patient has emphysema but attention on future studies or PET-CT may be helpful for further evaluation. 2. No findings for pulmonary metastatic disease. 3. Emphysematous changes and pulmonary scarring. 4. Debris in the trachea, likely mucous. 5. Stable hepatic and left renal cysts. 6. Emphysema and aortic atherosclerosis. Aortic Atherosclerosis (ICD10-I70.0) and Emphysema (ICD10-J43.9). Electronically Signed   By: Marijo Sanes M.D.   On: 10/11/2020 19:39   DG Abd Portable 1V  Result Date: 10/11/2020 CLINICAL DATA:  Abdominal pain EXAM: PORTABLE ABDOMEN - 1 VIEW COMPARISON:  10/09/2020 FINDINGS: Scattered large and small bowel gas is noted. Ostomy is noted on the left similar to that seen on prior exam. 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 No  obstructive changes are seen. Persistent fecal material is noted within the colon slightly increased when compared with the prior exam. No obstructive changes are seen. IMPRESSION: Slight increase in fecal material within the colon. No definitive obstructive changes seen. Electronically Signed   By: Inez Catalina M.D.   On: 10/11/2020 14:38      Flora Lipps, MD  [facility] 10/12/2020  If 7PM-7AM, please contact night-coverage

## 2020-10-12 NOTE — TOC Progression Note (Signed)
Transition of Care [facility]) - Progression Note    Patient Details  Name: Gabor Lusk MRN: [id number] Date of Birth: 1955/11/08  Transition of Care [facility]) CM/SW Contact  Melania Kirks, Juliann Pulse, RN Phone Number: 10/12/2020, 2:38 PM  Clinical Narrative: Currently no [place] agency to accept for HHRN-f/c flush- No acceptance-[facility]. Spoke to [facility] case manager-Tammy [phone] for assistance w/ d/c plans.      Expected Discharge Plan: Home/Self Care Barriers to Discharge: Continued Medical Work up  Expected Discharge Plan and Services Expected Discharge Plan: Home/Self Care   Discharge Planning Services: CM Consult   Living arrangements for the past 2 months: Single Family Home                                       Social Determinants of Health (SDOH) Interventions    Readmission Risk Interventions Readmission Risk Prevention Plan 10/04/2020  Transportation Screening Complete  PCP or Specialist Appt within 5-7 Days Complete  Home Care Screening Complete  Medication Review (RN CM) Complete

## 2020-10-12 NOTE — Plan of Care (Signed)
  Problem: Activity: Goal: Risk for activity intolerance will decrease Outcome: Progressing   Problem: Pain Managment: Goal: General experience of comfort will improve Outcome: Progressing   Problem: Safety: Goal: Ability to remain free from injury will improve Outcome: Progressing   

## 2020-10-13 ENCOUNTER — Inpatient Hospital Stay: Payer: Medicaid Other | Attending: Hematology | Admitting: Hematology

## 2020-10-13 MED ORDER — OXYCODONE HCL 5 MG PO TABS: 5.0000 mg | ORAL_TABLET | Freq: Four times a day (QID) | ORAL | 0 refills | Status: DC | PRN

## 2020-10-13 MED ORDER — B COMPLEX-C PO TABS: 1.0000 | ORAL_TABLET | Freq: Every day | ORAL | 0 refills | Status: AC

## 2020-10-13 MED ORDER — ACETAMINOPHEN 500 MG PO TABS: 1000.0000 mg | ORAL_TABLET | Freq: Three times a day (TID) | ORAL | 0 refills | Status: DC

## 2020-10-13 MED ORDER — BISACODYL 5 MG PO TBEC: 5.0000 mg | DELAYED_RELEASE_TABLET | Freq: Every day | ORAL | 0 refills | Status: DC | PRN

## 2020-10-13 MED ORDER — SENNOSIDES-DOCUSATE SODIUM 8.6-50 MG PO TABS: 1.0000 | ORAL_TABLET | Freq: Two times a day (BID) | ORAL | 2 refills | Status: AC

## 2020-10-13 MED ORDER — FOLIC ACID 1 MG PO TABS
1.0000 mg | ORAL_TABLET | Freq: Every day | ORAL | 0 refills | Status: AC
Start: 2020-10-13 — End: ?

## 2020-10-13 MED ORDER — CYANOCOBALAMIN 1000 MCG PO TABS: 1000.0000 ug | ORAL_TABLET | Freq: Every day | ORAL | 0 refills | Status: AC

## 2020-10-13 NOTE — TOC Transition Note (Addendum)
Transition of Care [facility]) - CM/SW Discharge Note   Patient Details  Name: Jamie Wilson MRN: [id number] Date of Birth: 1956-05-09  [facility]) CM/SW Contact:  Dessa Phi, RN Phone Number: 10/13/2020, 11:09 AM   Clinical Narrative:  D/c home. Patient refuses PT to work with him. Can safely d/c by Nsg to asst w/transportation. No further CM needs. CM has left a vm w/patient's insurance. 12:30p-Received call back from Tammy with insurance company-expressed that patient wants to sue hospital since we are kicking him out of the hospital,specifically the case manager. Informed that only the doctor safely discharges the patient to the care of himself if alert & oriented x3,or to family/friend care. She says patient states he is homeless. They will follow patient once d/c.CM will provide homeless shelter [facility] resource/[facility]-if patient gives consent. Per nsg patient is not homeless; [facility] requested-patient may get a bill for transportation-he should contact member services in his insurance. CM will call PT[facility] once d/c summary completed. 2p-Confirmed address-PTAR called. No further CM needs.   Final next level of care: Home/Self Care Barriers to Discharge: No Barriers Identified   Patient Goals and CMS Choice Patient states their goals for this hospitalization and ongoing recovery are:: return back to recovery house-[facility] Home CMS Medicare.gov Compare Post Acute Care list provided to:: Patient Choice offered to / list presented to : Patient  Discharge Placement                       Discharge Plan and Services   Discharge Planning Services: CM Consult                                 Social Determinants of Health (SDOH) Interventions     Readmission Risk Interventions Readmission Risk Prevention Plan 10/04/2020  Transportation Screening Complete  PCP or Specialist Appt within 5-7 Days Complete  Home Care Screening Complete  Medication Review (RN CM)  Complete

## 2020-10-13 NOTE — Plan of Care (Signed)
  Problem: Clinical Measurements: Goal: Ability to maintain clinical measurements within normal limits will improve Outcome: Adequate for Discharge Goal: Will remain free from infection Outcome: Adequate for Discharge Goal: Diagnostic test results will improve Outcome: Adequate for Discharge   Problem: Activity: Goal: Risk for activity intolerance will decrease Outcome: Adequate for Discharge   Problem: Nutrition: Goal: Adequate nutrition will be maintained Outcome: Adequate for Discharge   Problem: Coping: Goal: Level of anxiety will decrease Outcome: Adequate for Discharge   Problem: Elimination: Goal: Will not experience complications related to bowel motility Outcome: Adequate for Discharge Goal: Will not experience complications related to urinary retention Outcome: Adequate for Discharge   Problem: Pain Managment: Goal: General experience of comfort will improve Outcome: Adequate for Discharge   Problem: Safety: Goal: Ability to remain free from injury will improve Outcome: Adequate for Discharge   Problem: Skin Integrity: Goal: Risk for impaired skin integrity will decrease Outcome: Adequate for Discharge   Problem: Urinary Elimination: Goal: Signs and symptoms of infection will decrease Outcome: Adequate for Discharge

## 2020-10-13 NOTE — Progress Notes (Signed)
[facility] came to pick up patient going home via stretcher. VSS, not in any form of distress, personal belongings given to patient with AVS packet.

## 2020-10-13 NOTE — Discharge Summary (Signed)
Physician Discharge Summary  Jamie Wilson XJO:[id number] DOB: 09/18/1956 DOA: 10/02/2020  PCP: Patient, [name]  Admit date: 10/02/2020 Discharge date: 10/13/2020  Admitted From: Home  Discharge disposition: home    Recommendations for Outpatient Follow-Up:   . Follow up with your primary care provider in 1 to 2 weeks. . Check CBC, BMP, magnesium in the next visit . Follow-up with oncology to discuss about chemotherapy and other treatment. . Follow up with urology as outpatient to discuss about surgery.  Discharge Diagnosis:   Principal Problem:   Acute lower UTI Active Problems:   Hypercalcemia   Acute urinary retention   Mass of bladder   Malnutrition (HCC)   Sepsis (HCC)   Protein-calorie malnutrition, severe   Discharge Condition: Improved.  Diet recommendation: .  Regular.  Wound care: None.  Code status: Full.   History of Present Illness:   Jamie Wilson is a 64 year old male with medical history significant for chronic urinary retention with self-catheterization, history of small bowel obstruction requiring bowel resection and colostomy remotely, with recent outpatient history of hematuria despite several rounds of outpatient antibiotics for presumed UTI who presented to hospital with worsening lower abdominal pain and flank pain and was found to have hypercalcemia with calcium of 13.5 on admission, concern for UTI started on IV ceftriaxone and concern for new bladder mass on CT imaging.  Patient underwent transurethral resection of large bladder tumor and was found to have necrotic bladder tumor on12/9. Patient antibiotics were changed from ceftriaxone to cefepime due to concern for potential seeding with bacteremia given retrograde ureteral irrigation.Patient was then further broadened out to meropenem due to sepsis physiology with tachycardia, hypotension, and leukocytosis that has since improved. Blood cultures remain unremarkable. 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    Hospital Course:   Following conditions were addressed during hospitalization as listed below,  Necrotic bladder mass, positive for infiltrating high grade urothelial carcinoma, also concern for infected mass based off urology examination during TURBT and complicated by chronic indwelling catheter.Communicated with urology on October 12, 2020..  Will need outpatient follow-up.  He will however need to be continued on Foley catheter on discharge.  Communicated with Dr. Irene Limbo oncology on 10/12/20.  Patient will need outpatient follow-up prior to chemotherapy.  Patient was on IV meropenemto cover ESBL but no positive recent cultures.    Antibiotics have been discontinued at this time.  Sepsis secondary to infected bladder mass.  Improved at this time.  No recent positive cultures.  Previous urine cultures with E.coli resistant to fluroquinolones, zosyn, keflex.    No need for further antibiotic at this time.  Right ureteral stone, nonobstructing.  Continue Flomax.  Constipation, CT abdomen showed no fistula, abdominal x-ray shows large stool burden with no evidence of obstruction or ileus.  Likely secondary to use of opiates.    Will need laxative  regimen on discharge.    Hypercalcemia, unclear etiology. Received Zometa x1.  Could be secondary to dehydration, malignancy. Vitamin D level within normal limits.    Resolved at this time.  Hyponatremia,Hypophosphatemia, resolved  Chronic urinary retention for several years. Reports history of prostate enlargement.  Self cath at home.  Will need continue indwelling Foley catheter and Flomax on discharge.     Normocytic anemia, currently stable.  Anemia of chronic disease.  Hemoglobin currently at 8.5.Prior baseline 11-12.   Continue folic  acid  which is low.  No gross hematuria at this time.  Severe protein calorie malnutrition.  Present on admission.  Encouraged oral nutrition  Disposition.  At this time, patient is stable for disposition home.  Medical Consultants:    Urology,   oncology  Procedures:     TURBT with biopsy  Foley catheter placement.  Subjective:   Today, patient was seen and examined at bedside.  Patient denies any nausea vomiting abdominal pain  Discharge Exam:   Vitals:   10/12/20 2053 10/13/20 0530  BP: (!) 102/54 106/70  Pulse: 90 98  Resp: 18 15  Temp: 98.5 F (36.9 C) 98.2 F (36.8 C)  SpO2: 94% 95%   Vitals:   10/12/20 0953 10/12/20 1212 10/12/20 2053 10/13/20 0530  BP: 109/75 118/64 (!) 102/54 106/70  Pulse: 85 96 90 98  Resp: 16 17 18 15   Temp: 98.7 F (37.1 C) 97.9 F (36.6 C) 98.5 F (36.9 C) 98.2 F (36.8 C)  TempSrc: Oral Oral Oral Oral  SpO2: 96% 96% 94% 95%  Weight:      Height:       General: Alert awake, not in obvious distress, thinly built HENT: pupils equally reacting to light,  No scleral pallor or icterus noted. Oral mucosa is moist.  Chest:  Clear breath sounds.  Diminished breath sounds bilaterally. No crackles or wheezes.  CVS: S1 &S2 heard. No murmur.  Regular rate and rhythm. Abdomen: Soft, nontender, nondistended.  Bowel sounds are heard.  Foley catheter in place, colostomy bag in place.  Parostial hernia. Extremities: No cyanosis, clubbing or edema.  Peripheral pulses are palpable. Psych: Alert, awake and oriented, normal mood CNS:  No cranial nerve deficits.  Power equal in all extremities.   Skin: Warm and dry.  No rashes noted.  The results of significant diagnostics from this hospitalization (including imaging, microbiology, ancillary and laboratory) are listed below for reference.     Diagnostic Studies:   DG Abd 1 View  Result Date: 10/03/2020 CLINICAL DATA:  Right ureteral stone EXAM: ABDOMEN - 1 VIEW COMPARISON:  10/02/2020 FINDINGS:  Gaseous distension large and small bowel loops within the abdomen. Moderate volume stool within the right hemicolon. Numerous calcifications project over the right renal shadow. Known proximal right ureteral calculus is not definitively seen. IMPRESSION: 1. Numerous calcifications project over the right renal shadow. 2. Known proximal right ureteral calculus is not definitively seen. 3. Air-filled loops of large and small bowel suggesting ileus. Electronically Signed   By: Davina Poke D.O.   On: 10/03/2020 08:32   CT Renal Stone Study  Result Date: 10/02/2020 CLINICAL DATA:  Bilateral flank pain EXAM: CT ABDOMEN AND PELVIS WITHOUT CONTRAST TECHNIQUE: Multidetector CT imaging of the abdomen and pelvis was performed following the standard protocol without IV contrast. COMPARISON:  None. FINDINGS: Lower chest: The visualized heart size within normal limits. No pericardial fluid/thickening. No hiatal hernia. The visualized portions of the lungs are clear. Hepatobiliary: Multiple low-density lesions are seen throughout the liver parenchyma the largest in the right hepatic dome measures 5.5 cm. No evidence of calcified gallstones or biliary ductal dilatation. Pancreas:  Unremarkable.  No surrounding inflammatory changes. Spleen: Normal in size. Although limited due to the lack of intravenous contrast, normal in appearance. Adrenals/Urinary Tract: Both adrenal glands appear normal there is mild right-sided pelviectasis with stranding changes. There are multiple coarse calcifications throughout the right kidney the largest measuring 8 mm within the upper pole. Within the proximal right ureter there is  a 3 mm calculus present. There is calcifications seen in the lower pole the left kidney. Again noted within the bladder is heterogeneous soft tissue masslike area and wall thickening extending superiorly which appears to have a fistulous connection to the overlying small bowel and colonic loops. There now appears to  be small foci of air and debris. Stomach/Bowel: The stomach and small bowel are unremarkable. There is scattered colonic diverticula with a moderate amount of colonic stool present. A left lower quadrant ostomy is present. Vascular/Lymphatic: There are no enlarged abdominal or pelvic lymph nodes. Scattered aortic atherosclerotic calcifications are seen without aneurysmal dilatation. Reproductive: The patient is status post hysterectomy. No adnexal masses or collections seen. Other: No evidence of abdominal wall mass or hernia. Musculoskeletal: No acute or significant osseous findings. 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 Mild right pelviectasis with a proximal 3 mm ureteral calculi. Multiple bilateral non-obstructing renal calculi. Aortic Atherosclerosis (ICD10-I70.0). Electronically Signed   By: Prudencio Pair M.D.   On: 10/02/2020 03:46     Labs:   Basic Metabolic Panel: Recent Labs  Lab 10/08/20 0620 10/09/20 0537 10/10/20 0511 10/11/20 0520 10/12/20 0611  NA 136 137 135 137 136  K 3.6 3.8 4.2 4.0 3.9  CL 106 105 102 103 103  CO2 23 25 25 27 26   GLUCOSE 121* 114* 116* 118* 110*  BUN 14 17 17 23 22   CREATININE 0.99 0.96 0.90 1.01 1.00  CALCIUM 9.4 9.8 9.7 10.7* 9.9   GFR Estimated Creatinine Clearance: 60.5 mL/min (by C-G formula based on SCr of 1 mg/dL). Liver Function Tests: Recent Labs  Lab 10/08/20 0620 10/09/20 0537 10/10/20 0511 10/11/20 0520 10/12/20 0611  AST 16 15 18 15 15   ALT 18 19 19 18 17   ALKPHOS 69 69 74 83 84  BILITOT 0.3 0.1* 0.2* 0.2* 0.2*  PROT 5.5* 5.6* 6.3* 6.7 6.4*  ALBUMIN 1.9* 1.8* 2.0* 2.3* 2.1*   No results for input(s): LIPASE, AMYLASE in the last 168 hours. No results for input(s): AMMONIA in the last 168 hours. Coagulation profile No results for input(s): INR, PROTIME in  the last 168 hours.  CBC: Recent Labs  Lab 10/08/20 0620 10/09/20 0537 10/10/20 0511 10/11/20 0520 10/12/20 0611  WBC 13.8* 13.7* 15.3* 14.9* 13.6*  HGB 8.5* 8.5* 8.9* 9.0* 8.5*  HCT 27.2* 27.6* 28.9* 28.6* 27.2*  MCV 92.5 92.9 90.9 90.2 90.1  PLT 314 339 388 408* 374   Cardiac Enzymes: No results for input(s): CKTOTAL, CKMB, CKMBINDEX, TROPONINI in the last 168 hours. BNP: Invalid input(s): POCBNP CBG: Recent Labs  Lab 10/07/20 0751 10/08/20 0745 10/11/20 0748 10/12/20 0733  GLUCAP 100* 85 120* 95   D-Dimer No results for input(s): DDIMER in the last 72 hours. Hgb A1c No results for input(s): HGBA1C in the last 72 hours. Lipid Profile No results for input(s): CHOL, HDL, LDLCALC, TRIG, CHOLHDL, LDLDIRECT in the last 72 hours. Thyroid function studies No results for input(s): TSH, T4TOTAL, T3FREE, THYROIDAB in the last 72 hours.  Invalid input(s): FREET3 Anemia work up No results for input(s): VITAMINB12, FOLATE, FERRITIN, TIBC, IRON, RETICCTPCT in the last 72 hours. Microbiology Recent Results (from the past 240 hour(s))  Culture, blood (routine x 2)     Status: None   Collection Time: 10/07/20  8:19 AM   Specimen: BLOOD  Result Value Ref Range Status  Specimen Description   Final    BLOOD RIGHT ANTECUBITAL Performed at [facility] [address], [place], [place] [address]    Special Requests   Final    BOTTLES DRAWN AEROBIC AND ANAEROBIC Blood Culture adequate volume Performed at [facility] [address], [place], [place] [address]    Culture   Final    NO GROWTH 5 DAYS Performed at [facility], [place] [address] [place], [place] [address]    Report Status 10/12/2020 FINAL  Final  Culture, blood (routine x 2)     Status: None   Collection Time: 10/07/20  8:25 AM   Specimen: BLOOD  Result Value Ref Range Status   Specimen Description   Final    BLOOD BLOOD RIGHT FOREARM Performed at [facility] [address], [place], [place] [address]    Special Requests   Final    BOTTLES DRAWN AEROBIC ONLY Blood Culture adequate volume Performed at [facility] [address], [place], [place] [address]    Culture   Final    NO GROWTH 5 DAYS Performed at [facility], [place] [address], [place], [place] [address]    Report Status 10/12/2020 FINAL  Final     Discharge Instructions:   Discharge Instructions    Call MD for:  persistant nausea and vomiting   Complete by: As directed    Call MD for:  severe uncontrolled pain   Complete by: As directed    Call MD for:  temperature >100.4   Complete by: As directed    Diet general   Complete by: As directed    Discharge instructions   Complete by: As directed    Follow-up with your primary care provider in 1 to 2 weeks.  Follow-up with oncology to discuss about chemotherapy and other treatment.  Follow-up with urology as outpatient for discussion about surgery.  Continue to wear foley cath.   Increase activity slowly   Complete by: As directed    No wound care   Complete by: As directed      Allergies as of 10/13/2020      Reactions   Silvadene [silver Sulfadiazine] Rash      Medication List    TAKE these medications   acetaminophen 500 MG tablet Commonly known as: TYLENOL Take 2 tablets (1,000 mg total) by mouth every 8 (eight) hours. What changed:   medication strength  how much to take  when to take this  reasons to take this   B-complex with vitamin C tablet Take 1 tablet by mouth daily.   bisacodyl 5 MG EC tablet Commonly known as: DULCOLAX Take 1 tablet (5 mg total) by mouth daily as needed for moderate constipation.   cyanocobalamin 1000 MCG tablet Take 1 tablet (1,000 mcg total) by mouth daily.   folic acid 1 MG tablet Commonly known as: FOLVITE Take 1 tablet (1 mg total) by mouth daily.   oxyCODONE 5 MG immediate release tablet Commonly known as: Oxy  IR/ROXICODONE Take 1-2 tablets (5-10 mg total) by mouth every 6 (six) hours as needed for moderate pain or severe pain (5 moderate, 10 severe).   polyethylene glycol 17 g packet Commonly known as: MIRALAX / GLYCOLAX Take 17 g by mouth 2 (two) times daily.   senna-docusate 8.6-50 MG tablet Commonly known as: Senokot-S Take 1 tablet by mouth 2 (two) times daily.   tamsulosin 0.4 MG Caps capsule Commonly  known as: FLOMAX Take 1 capsule (0.4 mg total) by mouth daily.       Follow-up Information    [facility] Follow up.   Why: The office will contact you for a f/u appoint for about 3 weeks from now to change the catheter.  If you haven't heard from [place] by early next week, please call to make an appointment.  Contact information: [place] [address] [phone]       Brunetta Genera, MD Follow up.   Specialties: Hematology, Oncology Why: as per clinic Contact information: [place] [address] [phone]                Time coordinating discharge: 39 minutes  Signed:  Ceniya Fowers  [facility] 10/13/2020, 10:54 AM

## 2020-10-13 NOTE — Progress Notes (Signed)
Pt discharging home today per Dr. Louanne Belton. Pt's IV sites d/c'd x2. Sites WDL. VSS. Pt provided with AVS including discharge instructions, where to pick up prescriptions and upcoming appointments, however would not let RN go over it with him/educate him on the above. AVS packet placed with belongings. Pt provided with supplies for use at home including new foley catheter bag, irrigation kit, five bottles of sterile water, five two piece colostomy bags with skin barriers, and numerous barrier rings. Pt would not allow RN to demonstrate/educate on proper management of foley catheter at home, including irrigation. Pt continually interrupted RN stating "I went home with all of this last time y'all kicked me out of here" and multiple complaints about his roommates at home. RN then asked patient how he would like to be transported home by either [place] or [facility]. Pt states "at least [facility] will help me in the house. Melburn Popper just drops me off on the road. I can't walk that far, and I can't get into the house by myself. I need help. Just set up [facility]." CM Juliann Pulse made aware of the above.

## 2020-10-13 NOTE — Discharge Instructions (Signed)
Indwelling Urinary Catheter Care, Adult An indwelling urinary catheter is a thin tube that is put into your bladder. The tube helps to drain pee (urine) out of your body. The tube goes in through your urethra. Your urethra is where pee comes out of your body. Your pee will come out through the catheter, then it will go into a bag (drainage bag). Take good care of your catheter so it will work well. How to wear your catheter and bag Supplies needed  Sticky tape (adhesive tape) or a leg strap.  Alcohol wipe or soap and water (if you use tape).  A clean towel (if you use tape).  Large overnight bag.  Smaller bag (leg bag). Wearing your catheter Attach your catheter to your leg with tape or a leg strap.  Make sure the catheter is not pulled tight.  If a leg strap gets wet, take it off and put on a dry strap.  If you use tape to hold the bag on your leg: 1. Use an alcohol wipe or soap and water to wash your skin where the tape made it sticky before. 2. Use a clean towel to pat-dry that skin. 3. Use new tape to make the bag stay on your leg. Wearing your bags You should have been given a large overnight bag.  You may wear the overnight bag in the day or night.  Always have the overnight bag lower than your bladder.  Do not let the bag touch the floor.  Before you go to sleep, put a clean plastic bag in a wastebasket. Then hang the overnight bag inside the wastebasket. You should also have a smaller leg bag that fits under your clothes.  Always wear the leg bag below your knee.  Do not wear your leg bag at night. How to care for your skin and catheter Supplies needed  A clean washcloth.  Water and mild soap.  A clean towel. Caring for your skin and catheter      Clean the skin around your catheter every day: 1. Wash your hands with soap and water. 2. Wet a clean washcloth in warm water and mild soap. 3. Clean the skin around your urethra.  If you are  male:  Gently spread the folds of skin around your vagina (labia).  With the washcloth in your other hand, wipe the inner side of your labia on each side. Wipe from front to back.  If you are male:  Pull back any skin that covers the end of your penis (foreskin).  With the washcloth in your other hand, wipe your penis in small circles. Start wiping at the tip of your penis, then move away from the catheter.  Move the foreskin back in place, if needed. 4. With your free hand, hold the catheter close to where it goes into your body.  Keep holding the catheter during cleaning so it does not get pulled out. 5. With the washcloth in your other hand, clean the catheter.  Only wipe downward on the catheter.  Do not wipe upward toward your body. Doing this may push germs into your urethra and cause infection. 6. Use a clean towel to pat-dry the catheter and the skin around it. Make sure to wipe off all soap. 7. Wash your hands with soap and water.  Shower every day. Do not take baths.  Do not use cream, ointment, or lotion on the area where the catheter goes into your body, unless your doctor tells you   to.  Do not use powders, sprays, or lotions on your genital area.  Check your skin around the catheter every day for signs of infection. Check for: ? Redness, swelling, or pain. ? Fluid or blood. ? Warmth. ? Pus or a bad smell. How to empty the bag Supplies needed  Rubbing alcohol.  Gauze pad or cotton ball.  Tape or a leg strap. Emptying the bag Pour the pee out of your bag when it is ?- full, or at least 2-3 times a day. Do this for your overnight bag and your leg bag. 1. Wash your hands with soap and water. 2. Separate (detach) the bag from your leg. 3. Hold the bag over the toilet or a clean pail. Keep the bag lower than your hips and bladder. This is so the pee (urine) does not go back into the tube. 4. Open the pour spout. It is at the bottom of the bag. 5. Empty the  pee into the toilet or pail. Do not let the pour spout touch any surface. 6. Put rubbing alcohol on a gauze pad or cotton ball. 7. Use the gauze pad or cotton ball to clean the pour spout. 8. Close the pour spout. 9. Attach the bag to your leg with tape or a leg strap. 10. Wash your hands with soap and water. Follow instructions for cleaning the drainage bag:  From the product maker.  As told by your doctor. How to change the bag Supplies needed  Alcohol wipes.  A clean bag.  Tape or a leg strap. Changing the bag Replace your bag when it starts to leak, smell bad, or look dirty. 1. Wash your hands with soap and water. 2. Separate the dirty bag from your leg. 3. Pinch the catheter with your fingers so that pee does not spill out. 4. Separate the catheter tube from the bag tube where these tubes connect (at the connection valve). Do not let the tubes touch any surface. 5. Clean the end of the catheter tube with an alcohol wipe. Use a different alcohol wipe to clean the end of the bag tube. 6. Connect the catheter tube to the tube of the clean bag. 7. Attach the clean bag to your leg with tape or a leg strap. Do not make the bag tight on your leg. 8. Wash your hands with soap and water. General rules   Never pull on your catheter. Never try to take it out. Doing that can hurt you.  Always wash your hands before and after you touch your catheter or bag. Use a mild, fragrance-free soap. If you do not have soap and water, use hand sanitizer.  Always make sure there are no twists or bends (kinks) in the catheter tube.  Always make sure there are no leaks in the catheter or bag.  Drink enough fluid to keep your pee pale yellow.  Do not take baths, swim, or use a hot tub.  If you are male, wipe from front to back after you poop (have a bowel movement). Contact a doctor if:  Your pee is cloudy.  Your pee smells worse than usual.  Your catheter gets clogged.  Your catheter  leaks.  Your bladder feels full. Get help right away if:  You have redness, swelling, or pain where the catheter goes into your body.  You have fluid, blood, pus, or a bad smell coming from the area where the catheter goes into your body.  Your skin feels warm where   the catheter goes into your body.  You have a fever.  You have pain in your: ? Belly (abdomen). ? Legs. ? Lower back. ? Bladder.  You see blood in the catheter.  Your pee is pink or red.  You feel sick to your stomach (nauseous).  You throw up (vomit).  You have chills.  Your pee is not draining into the bag.  Your catheter gets pulled out. Summary  An indwelling urinary catheter is a thin tube that is placed into the bladder to help drain pee (urine) out of the body.  The catheter is placed into the part of the body that drains pee from the bladder (urethra).  Taking good care of your catheter will keep it working properly and help prevent problems.  Always wash your hands before and after touching your catheter or bag.  Never pull on your catheter or try to take it out. This information is not intended to replace advice given to you by your health care provider. Make sure you discuss any questions you have with your health care provider. Document Revised: 02/06/2019 Document Reviewed: 05/31/2017 [employer] Patient Education  [place]. Calcium Content of Food  Eating more than the serving size for a moderate or low-calcium food will make it a higher-calcium food. Foods made with high-calcium ingredients will also be high in calcium.  Unless otherwise noted, all foods are cooked: meat is roasted, fish is cooked with dry heat, and vegetables are cooked from fresh. Fruit is raw. This is a guide. Actual values may vary depending on the product and/or processing. Calcium-fortified foods may vary widely in the amount of calcium they have. Calcium found naturally in milk and dairy products is more  easily absorbed by the body. Values are rounded to the nearest 5-milligram (mg) increment and may be averaged with similar foods in the same group.  High Calcium (200 mg or more) Food Serving Milligrams (mg)  Cereal, calcium fortified  cup 200-670  Cereal bar, calcium fortified 1 each 300  Cheese: cheddar, mozzarella, muenster 1 oz 205  Cheese: provolone, jack, Swiss 1 oz 220  Cheese: ricotta, part skim  cup 335  Eggnog, nonalcoholic 1 cup 812  Fish, sardines, drained 3 oz 325  Milk, buttermilk 1 cup 285  Milk, dry solids  cup 210  Milk, evaporated 1 cup 660  Milk, fat free 1 cup 305  Milk, reduced fat 1 cup 285  Milk, whole 1 cup 275  Soy milk or rice milk, calcium fortified 1 cup 300-370  Tofu, fortified with calcium sulfate or lactate  cup 215  Yogurt, fruit or plain 8 oz 275-450  Moderate Calcium (50-200 mg) Food Serving Milligrams (mg)  Almonds 1 oz 75  Bagel, enriched, 4" 1 each 80  Beans, white, canned  cup 95  Biscuit, 2" 1 each 140  Cheese, American 1 oz 160  Cheese, blue or feta 1 oz 145  Cheese, parmesan 2 Tbsp 110  Chocolate 1.5-oz bar 85  Clams, canned 3 oz 80  Cottage cheese, low fat  cup 80  Crab, canned 3 oz 85  Cream of wheat, regular  cup 60  Dried beans and peas  cup 50-100  English muffin 1 each 95  Figs, dried 5 each 135  Fish, halibut 3 oz 50  Fish, perch 3 oz 115  Fish, salmon, canned with bones 3 oz 180  Frozen yogurt or ice milk  cup 105  Greens, beet  cup 80  Greens, collards  cup 135  Greens, mustard  cup 50  Greens, turnip or bok choy  cup 100  Hummus  cup 65  Ice cream, light  cup 110  Ice cream, regular  cup 85  Instant breakfast drink, prepared with water 1 cup 105-250  Kale, frozen  cup 90  Kale, raw 1 cup 90  Oatmeal  cup 85  Oatmeal: instant, fortified, prepared with water 1 packet 110  Okra  cup 90  Orange 1 each 50  Orange juice, calcium fortified   cup 175-200  Oysters 3 oz 80  Pudding, made with milk  cup 155  Roll, hamburger or hotdog 1 each 60  Soybeans  cup 130  Soy milk, not fortified 1 cup 60  Spinach  cup 135  Sweet potato, baked with skin 1 medium 55  Tortillas, 6" flour 2 each 80  Vegetable or soy patty 1 each 85  Waffles, 4" frozen 1 each 100  Lower Calcium (less than 50 mg) Food Serving Milligrams (mg)  Blackberries  cup 20  Bread, whole grain or white 1 slice 29-19  Broccoli  cup 30  Broccoli, raw  cup 20  Brussels sprouts  cup 30  Cabbage, Chinese, raw  cup 35  Cheese, cream 1 oz 20  Egg substitute, liquid  cup 35  Egg, whole 1 large 25  Green beans  cup 25  Kiwi 1 medium 25  Lentils  cup 20  Muffin 2 oz 20  Nuts, most varieties 1 oz 30  Peas, green, frozen  cup 20  Seeds, sunflower 1 oz 20  Sherbet  cup 40  Shrimp 3 oz 30  Sour cream 2 Tbsp 30  Spinach, raw 1 cup 30  Squash  cup 25  Tangerine 1 each 30  Tofu, unfortified  cup 20  Very Low Calcium (less than 20 mg)   Butter and oils 1 Tbsp  Fruit and fruit juice 1 each or  cup  Meat and poultry 1 oz  Popcorn 1 cup  Rice  cup  Tuna, canned 3 oz  Vegetables not previously listed  cup  Sources: [place] [employer]. [employer] Database for [employer]; [place]. Nutrition Facts and Information.

## 2020-10-13 NOTE — Plan of Care (Signed)
°  Problem: Elimination: Goal: Will not experience complications related to urinary retention Outcome: Progressing   Problem: Safety: Goal: Ability to remain free from injury will improve Outcome: Progressing    Problem: Activity: Goal: Risk for activity intolerance will decrease Outcome: Progressing

## 2020-11-01 ENCOUNTER — Encounter: Payer: Self-pay | Admitting: General Practice

## 2020-11-01 ENCOUNTER — Inpatient Hospital Stay: Payer: Medicaid Other | Attending: Hematology | Admitting: Hematology

## 2020-11-01 ENCOUNTER — Other Ambulatory Visit: Payer: Self-pay

## 2020-11-01 ENCOUNTER — Telehealth: Payer: Self-pay | Admitting: *Deleted

## 2020-11-01 DIAGNOSIS — C678 Malignant neoplasm of overlapping sites of bladder: Secondary | ICD-10-CM | POA: Diagnosis not present

## 2020-11-01 DIAGNOSIS — Z79899 Other long term (current) drug therapy: Secondary | ICD-10-CM | POA: Insufficient documentation

## 2020-11-01 DIAGNOSIS — Z7189 Other specified counseling: Secondary | ICD-10-CM

## 2020-11-01 DIAGNOSIS — Z9049 Acquired absence of other specified parts of digestive tract: Secondary | ICD-10-CM | POA: Diagnosis not present

## 2020-11-01 DIAGNOSIS — E43 Unspecified severe protein-calorie malnutrition: Secondary | ICD-10-CM | POA: Diagnosis not present

## 2020-11-01 DIAGNOSIS — Z8249 Family history of ischemic heart disease and other diseases of the circulatory system: Secondary | ICD-10-CM | POA: Diagnosis not present

## 2020-11-01 DIAGNOSIS — R627 Adult failure to thrive: Secondary | ICD-10-CM

## 2020-11-01 DIAGNOSIS — F17210 Nicotine dependence, cigarettes, uncomplicated: Secondary | ICD-10-CM | POA: Diagnosis not present

## 2020-11-01 DIAGNOSIS — D649 Anemia, unspecified: Secondary | ICD-10-CM

## 2020-11-01 NOTE — Progress Notes (Signed)
HEMATOLOGY/ONCOLOGY CONSULTATION NOTE  Date of Service: 11/01/2020  Patient Care Team: Patient, [name] as PCP - General (General Practice) Freada Bergeron, MD as PCP - Cardiology (Cardiology)  CHIEF COMPLAINTS/PURPOSE OF CONSULTATION:  Bladder Cancer  HISTORY OF PRESENTING ILLNESS:  I connected with  Clance Boll on 11/01/20 by telephone and verified that I am speaking with the correct person using two identifiers.   I discussed the limitations of evaluation and management by telemedicine. The patient expressed understanding and agreed to proceed.  Other persons participating in the visit and their role in the encounter:        -Yevette Edwards, [place], Medical Scribe  Patient's location: Home Provider's location: [facility] [place] is a wonderful 65 y.o. male who is here after being seen in the hospital for evaluation and management of bladder cancer.  The pt reports that he feels that his kidney stones have resolved. He continues to report white mucous in his catheter that is about the same volume as before. He has started moving his bowels again. Pt is having difficulty walking due to severe Gout which has left him bedbound. Pt is having discomfort in many of his toes. This flare started upon his hospital discharge. He is scheduled for his first visit with a PCP on 11/07/2020. The pt is taking a significant amount of Tylenol to control his lower abdominal pain. His appetite has been lacking and he has lost weight since hospitalization. He also does not appear to have access to adequate nutrition.   Of note prior to the patient's visit today, pt has had CT Abd/Pel ([id number]) completed on 10/08/2020 with results revealing "1. Irregular thickening of the bladder wall with enhancement. Gas and nonenhancing material centrally within the bladder. Bladder neoplasm is certainly a consideration. 2. Bilateral nephrolithiasis. No ureterolithiasis  or obstructive uropathy. 3. 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 4. Aortic Atherosclerosis (ICD10-I70.0)."  Pt has had CT Chest ([id number]) completed on 10/11/2020 with results revealing "1. Borderline enlarged mediastinal and hilar lymph nodes. These are likely related to the patient has emphysema but attention on future studies or PET-CT may be helpful for further evaluation. 2. No findings for pulmonary metastatic disease. 3. Emphysematous changes and pulmonary scarring. 4. Debris in the trachea, likely mucous. 5. Stable hepatic and left renal cysts. 6. Emphysema and aortic atherosclerosis."  On review of systems, pt reports lower abdominal discomfort, night sweats, weight loss, low appetite, SOB and denies hematuria, fevers, chills, constipation and any other symptoms.   On PMHx the pt reports Transurethral Resection of Bladder Tumor, Colostomy.  MEDICAL HISTORY:  Past Medical History:  Diagnosis Date  . Colostomy care ([place])   . Toe infection     SURGICAL HISTORY: Past Surgical History:  Procedure Laterality Date  . ABDOMINAL SURGERY    . CYSTOSCOPY/URETEROSCOPY/HOLMIUM LASER/STENT PLACEMENT Right 10/06/2020   Procedure: CYSTO;  Surgeon: Irine Seal, MD;  Location: [facility];  Service: Urology;  Laterality: Right;  . TRANSURETHRAL RESECTION OF BLADDER TUMOR N/A 10/06/2020   Procedure: POSSIBLE TRANSURETHRAL RESECTION OF BLADDER TUMOR (TURBT);  Surgeon: Irine Seal, MD;  Location: [facility];  Service: Urology;  Laterality: N/A;    SOCIAL HISTORY: Social History   Socioeconomic History  . Marital status: Divorced    Spouse name: Not on file  . Number of children: Not on file  . Years of education: Not on file  . Highest education level:  Not on file  Occupational History  . Not on file  Tobacco Use  . Smoking status: Current Some Day Smoker  . Smokeless tobacco: Never Used  Substance and Sexual Activity  . Alcohol use: Yes  . Drug use: Yes    Types: Marijuana  .  Sexual activity: Not on file  Other Topics Concern  . Not on file  Social History Narrative  . Not on file   Social Determinants of Health   Financial Resource Strain: Not on file  Food Insecurity: Not on file  Transportation Needs: Not on file  Physical Activity: Not on file  Stress: Not on file  Social Connections: Not on file  Intimate Partner Violence: Not on file    FAMILY HISTORY: Family History  Problem Relation Age of Onset  . CAD Neg Hx   . Heart failure Neg Hx     ALLERGIES:  is allergic to silvadene [silver sulfadiazine].  MEDICATIONS:  Current Outpatient Medications  Medication Sig Dispense Refill  . acetaminophen (TYLENOL) 500 MG tablet Take 2 tablets (1,000 mg total) by mouth every 8 (eight) hours. 30 tablet 0  . B Complex-C (B-COMPLEX WITH VITAMIN C) tablet Take 1 tablet by mouth daily. 100 tablet 0  . bisacodyl (DULCOLAX) 5 MG EC tablet Take 1 tablet (5 mg total) by mouth daily as needed for moderate constipation. 30 tablet 0  . folic acid (FOLVITE) 1 MG tablet Take 1 tablet (1 mg total) by mouth daily. 100 tablet 0  . oxyCODONE (OXY IR/ROXICODONE) 5 MG immediate release tablet Take 1-2 tablets (5-10 mg total) by mouth every 6 (six) hours as needed for moderate pain or severe pain (5 moderate, 10 severe). 20 tablet 0  . polyethylene glycol (MIRALAX / GLYCOLAX) 17 g packet Take 17 g by mouth 2 (two) times daily. (Patient not taking: Reported on 10/02/2020) 14 each 0  . senna-docusate (SENOKOT-S) 8.6-50 MG tablet Take 1 tablet by mouth 2 (two) times daily. 60 tablet 2  . tamsulosin (FLOMAX) 0.4 MG CAPS capsule Take 1 capsule (0.4 mg total) by mouth daily. (Patient not taking: Reported on 10/02/2020) 30 capsule 2  . vitamin B-12 1000 MCG tablet Take 1 tablet (1,000 mcg total) by mouth daily. 100 tablet 0   No current facility-administered medications for this visit.    REVIEW OF SYSTEMS:    10 Point review of Systems was done is negative except as noted  above.  PHYSICAL EXAMINATION: ECOG PERFORMANCE STATUS: 4 - Bedbound  .There were no vitals filed for this visit. There were no vitals filed for this visit. .There is no height or weight on file to calculate BMI.   Telehealth visit 11/01/2020  LABORATORY DATA:  I have reviewed the data as listed  . CBC Latest Ref Rng & Units 10/12/2020 10/11/2020 10/10/2020  WBC 4.0 - 10.5 K/uL 13.6(H) 14.9(H) 15.3(H)  Hemoglobin 13.0 - 17.0 g/dL 7.5(I) 4.3(P) 8.9(L)  Hematocrit 39.0 - 52.0 % 27.2(L) 28.6(L) 28.9(L)  Platelets 150 - 400 K/uL 374 408(H) 388    . CMP Latest Ref Rng & Units 10/12/2020 10/11/2020 10/10/2020  Glucose 70 - 99 mg/dL 295(J) 884(Z) 660(Y)  BUN 8 - 23 mg/dL 22 23 17   Creatinine 0.61 - 1.24 mg/dL 3.01 6.01  Sodium 135 - 145 mmol/L 136 137 135  Potassium 3.5 - 5.1 mmol/L 3.9 4.0 4.2  Chloride 98 - 111 mmol/L 103 103 102  CO2 22 - 32 mmol/L 26 27 25   Calcium 8.9 - 10.3 mg/dL  9.9 10.7(H) 9.7  Total Protein 6.5 - 8.1 g/dL 6.4(L) 6.7 6.3(L)  Total Bilirubin 0.3 - 1.2 mg/dL 0.2(L) 0.2(L) 0.2(L)  Alkaline Phos 38 - 126 U/L 84 83 74  AST 15 - 41 U/L 15 15 18   ALT 0 - 44 U/L 17 18 19      RADIOGRAPHIC STUDIES: I have personally reviewed the radiological images as listed and agreed with the findings in the report. CT ABDOMEN PELVIS W WO CONTRAST  Result Date: 10/08/2020 CLINICAL DATA:  Abdominal pain, bladder masses. Potential bladder enteric fistula EXAM: CT ABDOMEN AND PELVIS WITHOUT AND WITH CONTRAST TECHNIQUE: Multidetector CT imaging of the abdomen and pelvis was performed following the standard protocol before and following the bolus administration of intravenous contrast. CONTRAST:  198mL OMNIPAQUE IOHEXOL 300 MG/ML  SOLN COMPARISON:  None. FINDINGS: Lower chest: Bilateral small effusions.  Mild passive atelectasis. Hepatobiliary: Multiple benign hepatic cysts. Cysts are large measuring up to 5 cm. Gallbladder collapsed. Pancreas: Pancreas is normal. No ductal  dilatation. No pancreatic inflammation. Spleen: Normal spleen Adrenals/urinary tract: Coarse RIGHT renal calculi. Small LEFT renal calculus. Ureteral calculi. No bladder calculi. No enhancing renal cortical lesion. Cortical scarring of the RIGHT kidney. Benign cyst of the LEFT kidney. Irregular thickening of the bladder wall with enhancement. Gas and nonenhancing material centrally within the bladder. Foley catheter with retention bulb in the bladder. Stomach/Bowel: Stomach, small bowel cecum normal. Appendix not identified. LEFT abdominal wall colostomy. Partial LEFT colon colectomy. Stump at the sigmoid colon. Rectum appears normal. Vascular/Lymphatic: Abdominal aorta is normal caliber with atherosclerotic calcification. There is no retroperitoneal or periportal lymphadenopathy. No pelvic lymphadenopathy. Reproductive: Prostate unremarkable Other: No free fluid or free air Musculoskeletal: No aggressive osseous lesion IMPRESSION: 1. Irregular thickening of the bladder wall with enhancement. Gas and nonenhancing material centrally within the bladder. Bladder neoplasm is certainly a consideration. 2. Bilateral nephrolithiasis. No ureterolithiasis or obstructive uropathy. 3. 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 4. Aortic Atherosclerosis (ICD10-I70.0). Electronically Signed   By: Suzy Bouchard M.D.   On: 10/08/2020 04:19   DG Chest 2 View  Result Date: 10/10/2020 CLINICAL DATA:  Bladder cancer. EXAM: CHEST - 2 VIEW COMPARISON:  CT abdomen/pelvis 10/08/2020. Prior chest radiograph 08/16/2020. FINDINGS: Heart size within normal limits. Aortic atherosclerosis. No appreciable airspace consolidation or pulmonary nodules. Biapical pleuroparenchymal scarring. Possible trace right pleural effusion. No evidence of pneumothorax. No acute bony abnormality. IMPRESSION: No appreciable airspace consolidation or pulmonary nodules. Possible trace right pleural effusion. Aortic Atherosclerosis (ICD10-I70.0).  Electronically Signed   By: Kellie Simmering DO   On: 10/10/2020 09:32   DG Abd 1 View  Result Date: 10/03/2020 CLINICAL DATA:  Right ureteral stone EXAM: ABDOMEN - 1 VIEW COMPARISON:  10/02/2020 FINDINGS: Gaseous distension large and small bowel loops within the abdomen. Moderate volume stool within the right hemicolon. Numerous calcifications project over the right renal shadow. Known proximal right ureteral calculus is not definitively seen. IMPRESSION: 1. Numerous calcifications project over the right renal shadow. 2. Known proximal right ureteral calculus is not definitively seen. 3. Air-filled loops of large and small bowel suggesting ileus. Electronically Signed   By: Davina Poke D.O.   On: 10/03/2020 08:32   CT CHEST W CONTRAST  Result Date: 10/11/2020 CLINICAL DATA:  Bladder cancer.  Staging chest CT. EXAM: CT CHEST WITH CONTRAST TECHNIQUE: Multidetector CT imaging of the chest was performed during intravenous contrast administration. CONTRAST:  50mL OMNIPAQUE IOHEXOL 300 MG/ML  SOLN COMPARISON:  None. FINDINGS: Cardiovascular: The heart is normal  in size. Small amount of pericardial fluid but no overt effusion.The aorta is normal in caliber. No dissection. Stable atherosclerotic calcifications. Stable branch vessel calcifications. The pulmonary arteries are unremarkable. Mediastinum/Nodes: Borderline enlarged mediastinal and hilar lymph nodes. Left hilar node on image 65/2 measures 11 mm. Right hilar node on image number 68/2 measures 9.5 mm. Right-sided subcarinal lymph node on image number 92/2 measures 10 mm. Right infrahilar node on image 85/2 measures 7 mm. The esophagus is grossly normal.  Thyroid gland is unremarkable. Lungs/Pleura: Emphysematous changes are noted along with pulmonary scarring. No infiltrates or effusions. Streaky bibasilar atelectasis. No worrisome pulmonary lesions and no worrisome pulmonary nodules to suggest metastatic disease. Mild tracheomegaly. There is some debris  in the trachea. This is likely mucous. I do not see an obvious mass. Upper Abdomen: Stable hepatic cysts and left renal cyst. Musculoskeletal: No chest wall mass, supraclavicular or axillary adenopathy. The bony thorax is intact.  No worrisome bone lesions. IMPRESSION: 1. Borderline enlarged mediastinal and hilar lymph nodes. These are likely related to the patient has emphysema but attention on future studies or PET-CT may be helpful for further evaluation. 2. No findings for pulmonary metastatic disease. 3. Emphysematous changes and pulmonary scarring. 4. Debris in the trachea, likely mucous. 5. Stable hepatic and left renal cysts. 6. Emphysema and aortic atherosclerosis. Aortic Atherosclerosis (ICD10-I70.0) and Emphysema (ICD10-J43.9). Electronically Signed   By: Rudie MeyerP.  Gallerani M.D.   On: 10/11/2020 19:39   DG Abd Portable 1V  Result Date: 10/11/2020 CLINICAL DATA:  Abdominal pain EXAM: PORTABLE ABDOMEN - 1 VIEW COMPARISON:  10/09/2020 FINDINGS: Scattered large and small bowel gas is noted. Ostomy is noted on the left similar to that seen on prior exam. 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 No obstructive changes are seen. Persistent fecal material is noted within the colon slightly increased when compared with the prior exam. No obstructive changes are seen. IMPRESSION: Slight increase in fecal material within the colon. No definitive obstructive changes seen. Electronically Signed   By: Alcide CleverMark  Lukens M.D.   On: 10/11/2020 14:38   DG Abd Portable 1V  Result Date: 10/09/2020 CLINICAL DATA:  Abdominal pain. EXAM: PORTABLE ABDOMEN - 1 VIEW COMPARISON:  October 03, 2020. FINDINGS: No abnormal bowel dilatation is noted. Large amount of stool is seen throughout the colon. Right nephrolithiasis is noted. IMPRESSION: Large stool burden is noted. No evidence of bowel obstruction or ileus. Electronically Signed   By: Lupita RaiderJames  Green Jr M.D.   On: 10/09/2020 12:10    ASSESSMENT & PLAN:   65 yo with    1) newly diagnosed muscle invasive bladder cancer tx,nx,Mx , poorly , multiple morphologies. 2) ?mediastinal/hilar LN - borderline ? Mets 3) poor functional status ECOG PS 3 4) Severe protein calorie malnutrition 5) Poor social support system-- was incarcerated for a long time and currently lives in halfway house. 6) Recurent UTI's-- recently with hematuria and sepsis. PLAN: -Advised pt that that the white mucous in his catheter could be caused by irritation, infection, or bladder cancer.  -Discussed typical chemotherapy treatments for Bladder Cancer. Advised pt that considering his current functional status, nutritional status, home enviornment, and transportation situation he may not be a good candidate for neoadjuvant chemotherapy at this time.  -Advised pt that treatment for Bladder Cancer is typically aggressive and will require support and a decent functional status.  -Recommend pt go to the ED today for uncontrolled discomfort and potential infection. -Will refer pt to our Child psychotherapistocial Worker ASAP -Will  refer pt to our Dietician ASAP -Will get PET/CT in 1 week with labs -Currently patient is not a good candidate for neoadjuvant cisplatin based chemotherapy. Will need to have urology weigh in regarding candidacy for cystectomy followed by adjuvant rx options. Alternative might need to consider chemo RT or palliative RT alone. -Will see back in 10 days   FOLLOW UP: Social worker consultation ASAP Dietician referral ASAP PET/CT in 1 week, labs in 1 week RTC with Dr Irene Limbo in 10 days Patient was instructed to go to ED today   All of the patients questions were answered with apparent satisfaction. The patient knows to call the clinic with any problems, questions or concerns.  I spent 40 mins counseling the patient face to face. The total time spent in the appointment was 60 minutes and more than 50% was on counseling and direct patient cares.    Sullivan Lone MD [place] AAHIVMS [facility]  [facility] Hematology/Oncology Physician [facility]  (Office):       [phone] (Work cell):  [phone] (Fax):           [phone]  11/01/2020 11:18 AM  I, Yevette Edwards, am acting as a scribe for Dr. Sullivan Lone.   .I have reviewed the above documentation for accuracy and completeness, and I agree with the above. Brunetta Genera MD

## 2020-11-01 NOTE — Progress Notes (Signed)
[facility] CSW Progress Notes  Call to patient per referral for psychosocial needs/support.  Unable to reach patient, left VM w social work team contact information and request to return call.  Santa Genera, LCSW Clinical Social Worker Phone:  [phone]

## 2020-11-01 NOTE — Telephone Encounter (Signed)
Contacted patient at Dr. Clyda Greener request. Patient did not come to new patient appt on 12/16 and Dr. Candise Che requested to have him scheduled today if possible for in office or phone appt.  Per patient -he has gout so bad he cannot walk and is in bed. He said he is passing "white mucus stuff" through his catheter. Having pain in abdomen 8/10. Once he takes tylenol, it decreases some. He says he needs to go back to hospital - pain is too much. Has appt with PCP 1/10, but does not think he can make it til then. He is agreeable to a phone appt this morning.  Dr. Candise Che informed. He will add patient  today for phone visit.

## 2020-11-02 ENCOUNTER — Encounter (HOSPITAL_COMMUNITY): Payer: Self-pay | Admitting: Emergency Medicine

## 2020-11-02 ENCOUNTER — Inpatient Hospital Stay (HOSPITAL_COMMUNITY)
Admission: EM | Admit: 2020-11-02 | Discharge: 2020-12-23 | DRG: 871 | Disposition: A | Discharge status: Skilled Nursing Facility | Payer: Medicaid Other | Attending: Internal Medicine | Admitting: Internal Medicine

## 2020-11-02 ENCOUNTER — Encounter: Payer: Self-pay | Admitting: General Practice

## 2020-11-02 DIAGNOSIS — I951 Orthostatic hypotension: Secondary | ICD-10-CM | POA: Diagnosis not present

## 2020-11-02 DIAGNOSIS — M109 Gout, unspecified: Secondary | ICD-10-CM | POA: Diagnosis present

## 2020-11-02 DIAGNOSIS — C679 Malignant neoplasm of bladder, unspecified: Secondary | ICD-10-CM | POA: Diagnosis present

## 2020-11-02 DIAGNOSIS — Z79899 Other long term (current) drug therapy: Secondary | ICD-10-CM

## 2020-11-02 DIAGNOSIS — Z1612 Extended spectrum beta lactamase (ESBL) resistance: Secondary | ICD-10-CM | POA: Diagnosis present

## 2020-11-02 DIAGNOSIS — Z681 Body mass index (BMI) 19 or less, adult: Secondary | ICD-10-CM

## 2020-11-02 DIAGNOSIS — F17200 Nicotine dependence, unspecified, uncomplicated: Secondary | ICD-10-CM | POA: Diagnosis present

## 2020-11-02 DIAGNOSIS — E46 Unspecified protein-calorie malnutrition: Secondary | ICD-10-CM | POA: Diagnosis present

## 2020-11-02 DIAGNOSIS — Z87440 Personal history of urinary (tract) infections: Secondary | ICD-10-CM

## 2020-11-02 DIAGNOSIS — A499 Bacterial infection, unspecified: Secondary | ICD-10-CM

## 2020-11-02 DIAGNOSIS — Z933 Colostomy status: Secondary | ICD-10-CM

## 2020-11-02 DIAGNOSIS — E876 Hypokalemia: Secondary | ICD-10-CM | POA: Diagnosis not present

## 2020-11-02 DIAGNOSIS — R627 Adult failure to thrive: Secondary | ICD-10-CM | POA: Diagnosis present

## 2020-11-02 DIAGNOSIS — R140 Abdominal distension (gaseous): Secondary | ICD-10-CM

## 2020-11-02 DIAGNOSIS — D689 Coagulation defect, unspecified: Secondary | ICD-10-CM | POA: Diagnosis present

## 2020-11-02 DIAGNOSIS — D649 Anemia, unspecified: Secondary | ICD-10-CM

## 2020-11-02 DIAGNOSIS — R339 Retention of urine, unspecified: Secondary | ICD-10-CM | POA: Diagnosis not present

## 2020-11-02 DIAGNOSIS — R7989 Other specified abnormal findings of blood chemistry: Secondary | ICD-10-CM

## 2020-11-02 DIAGNOSIS — N390 Urinary tract infection, site not specified: Secondary | ICD-10-CM

## 2020-11-02 DIAGNOSIS — Z20822 Contact with and (suspected) exposure to covid-19: Secondary | ICD-10-CM | POA: Diagnosis present

## 2020-11-02 DIAGNOSIS — D630 Anemia in neoplastic disease: Secondary | ICD-10-CM | POA: Diagnosis present

## 2020-11-02 DIAGNOSIS — E43 Unspecified severe protein-calorie malnutrition: Secondary | ICD-10-CM | POA: Diagnosis present

## 2020-11-02 DIAGNOSIS — Z882 Allergy status to sulfonamides status: Secondary | ICD-10-CM

## 2020-11-02 DIAGNOSIS — E162 Hypoglycemia, unspecified: Secondary | ICD-10-CM | POA: Diagnosis present

## 2020-11-02 DIAGNOSIS — S99929A Unspecified injury of unspecified foot, initial encounter: Secondary | ICD-10-CM

## 2020-11-02 DIAGNOSIS — C7951 Secondary malignant neoplasm of bone: Secondary | ICD-10-CM

## 2020-11-02 DIAGNOSIS — Z515 Encounter for palliative care: Secondary | ICD-10-CM

## 2020-11-02 DIAGNOSIS — Z638 Other specified problems related to primary support group: Secondary | ICD-10-CM

## 2020-11-02 DIAGNOSIS — R509 Fever, unspecified: Secondary | ICD-10-CM | POA: Diagnosis not present

## 2020-11-02 DIAGNOSIS — Z9119 Patient's noncompliance with other medical treatment and regimen: Secondary | ICD-10-CM

## 2020-11-02 DIAGNOSIS — Z7189 Other specified counseling: Secondary | ICD-10-CM

## 2020-11-02 DIAGNOSIS — R21 Rash and other nonspecific skin eruption: Secondary | ICD-10-CM | POA: Diagnosis not present

## 2020-11-02 DIAGNOSIS — N179 Acute kidney failure, unspecified: Secondary | ICD-10-CM | POA: Diagnosis not present

## 2020-11-02 DIAGNOSIS — A419 Sepsis, unspecified organism: Secondary | ICD-10-CM | POA: Diagnosis present

## 2020-11-02 DIAGNOSIS — A4151 Sepsis due to Escherichia coli [E. coli]: Principal | ICD-10-CM | POA: Diagnosis present

## 2020-11-02 DIAGNOSIS — N200 Calculus of kidney: Secondary | ICD-10-CM | POA: Diagnosis present

## 2020-11-02 HISTORY — DX: Malignant neoplasm of bladder, unspecified: C67.9

## 2020-11-02 MED ORDER — LACTATED RINGERS IV BOLUS (SEPSIS)
1000.0000 mL | Freq: Once | INTRAVENOUS | Status: AC
  Administered 2020-11-03: 1000 mL via INTRAVENOUS

## 2020-11-02 MED ORDER — LACTATED RINGERS IV SOLN: INTRAVENOUS | Status: DC

## 2020-11-02 MED ORDER — ACETAMINOPHEN 500 MG PO TABS
1000.0000 mg | ORAL_TABLET | Freq: Once | ORAL | Status: AC
  Administered 2020-11-03: 1000 mg via ORAL
  Filled 2020-11-02: qty 2

## 2020-11-02 MED ORDER — HYDROMORPHONE HCL 1 MG/ML IJ SOLN
0.5000 mg | Freq: Once | INTRAMUSCULAR | Status: AC
  Administered 2020-11-03: 0.5 mg via INTRAVENOUS
  Filled 2020-11-02: qty 1

## 2020-11-02 NOTE — ED Triage Notes (Signed)
Per EMS, pt recently diagnosed with bladder cancer but not currently seeking treatment. Pt complains of pain and wanting antibiotics. Is currently having pain rated 9/10.   500 NS 99% on 2L 157/96 BP 124 HR  24 RR 97.9 temp

## 2020-11-02 NOTE — Progress Notes (Signed)
[facility] CSW Progress Notes  Second attempt to reach patient per referral from medical oncologist to assess psychosocial needs/resources.  Unable to reach, left VM w my contact information and encouragement to return call.  Santa Genera, LCSW Clinical Social Worker Phone:  [phone]

## 2020-11-02 NOTE — ED Provider Notes (Signed)
[facility] DEPT Provider Note  CSN: [id number] Arrival date & time: 11/02/20 2238  Chief Complaint(s) Pain and Bladder Cancer  HPI Jamie Wilson is a 65 y.o. male with a past medical history listed below recently diagnosed with bladder cancer and obstruction requiring Foley catheter.  He presents today for penile pain.  Reports that he changes the Foley catheter himself 1 week ago.  Reports that he is leaking urine around the catheter.  Reported some white mucus discharge at the tip of the urethral meatus.  He denies any fevers or chills.  No coughing or congestion.  No nausea vomiting.  No diarrhea.  No abdominal pain.    He was evaluated over the phone by oncology yesterday who recommended he present to the emergency department yesterday for evaluation of potential infection.  They also placed referral for social work given his socioeconomic restraints.  HPI  Past Medical History Past Medical History:  Diagnosis Date  . Colostomy care ([place])   . Toe infection    Patient Active Problem List   Diagnosis Date Noted  . Protein-calorie malnutrition, severe 10/12/2020  . Sepsis ([place]) 10/10/2020  . Acute lower UTI 10/02/2020  . 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 Malnutrition ([place]) 10/02/2020  . Acute urinary retention 08/28/2020  . Small bowel obstruction ([place]) 08/16/2020  . Hypercalcemia 08/16/2020  . AKI (acute kidney injury) ([place]) 08/16/2020  . Right nephrolithiasis 08/16/2020   Home Medication(s) Prior to Admission medications   Medication Sig Start Date End Date Taking? Authorizing Provider  acetaminophen (TYLENOL) 500 MG tablet Take 2 tablets (1,000 mg total) by mouth every 8 (eight) hours. Patient taking differently: Take 1,000 mg by mouth every 8 (eight) hours as needed for mild pain, moderate pain, fever or headache. 10/13/20  Yes Pokhrel, Laxman, MD  B Complex-C (B-COMPLEX WITH VITAMIN C) tablet Take 1 tablet by mouth daily. 10/13/20  Yes Pokhrel,  Laxman, MD  folic acid (FOLVITE) 1 MG tablet Take 1 tablet (1 mg total) by mouth daily. 10/13/20  Yes Pokhrel, Laxman, MD  tamsulosin (FLOMAX) 0.4 MG CAPS capsule Take 1 capsule (0.4 mg total) by mouth daily. 08/28/20  Yes Barb Merino, MD  vitamin B-12 1000 MCG tablet Take 1 tablet (1,000 mcg total) by mouth daily. 10/13/20  Yes Pokhrel, Laxman, MD  bisacodyl (DULCOLAX) 5 MG EC tablet Take 1 tablet (5 mg total) by mouth daily as needed for moderate constipation. Patient not taking: Reported on 11/03/2020 10/13/20   Pokhrel, Corrie Mckusick, MD  oxyCODONE (OXY IR/ROXICODONE) 5 MG immediate release tablet Take 1-2 tablets (5-10 mg total) by mouth every 6 (six) hours as needed for moderate pain or severe pain (5 moderate, 10 severe). Patient not taking: Reported on 11/03/2020 10/13/20   Pokhrel, Corrie Mckusick, MD  polyethylene glycol (MIRALAX / GLYCOLAX) 17 g packet Take 17 g by mouth 2 (two) times daily. Patient not taking: No sig reported 08/28/20   Barb Merino, MD  senna-docusate (SENOKOT-S) 8.6-50 MG tablet Take 1 tablet by mouth 2 (two) times daily. Patient not taking: Reported on 11/03/2020 10/13/20   Flora Lipps, MD  Past Surgical History Past Surgical History:  Procedure Laterality Date  . ABDOMINAL SURGERY    . CYSTOSCOPY/URETEROSCOPY/HOLMIUM LASER/STENT PLACEMENT Right 10/06/2020   Procedure: CYSTO;  Surgeon: Irine Seal, MD;  Location: [facility];  Service: Urology;  Laterality: Right;  . TRANSURETHRAL RESECTION OF BLADDER TUMOR N/A 10/06/2020   Procedure: POSSIBLE TRANSURETHRAL RESECTION OF BLADDER TUMOR (TURBT);  Surgeon: Irine Seal, MD;  Location: [facility];  Service: Urology;  Laterality: N/A;   Family History Family History  Problem Relation Age of Onset  . CAD Neg Hx   . Heart failure Neg Hx     Social History Social History   Tobacco Use  . Smoking status:  Current Some Day Smoker  . Smokeless tobacco: Never Used  Substance Use Topics  . Alcohol use: Yes  . Drug use: Yes    Types: Marijuana   Allergies Silvadene [silver sulfadiazine]  Review of Systems Review of Systems All other systems are reviewed and are negative for acute change except as noted in the HPI  Physical Exam Vital Signs  I have reviewed the triage vital signs BP 107/85   Pulse 92   Temp (!) 97.5 F (36.4 C) (Oral)   Resp 15   Ht 6\' 1"  (1.854 m)   SpO2 100%   BMI 16.90 kg/m   Physical Exam Vitals reviewed.  Constitutional:      General: He is not in acute distress.    Appearance: He is well-developed and well-nourished. He is not diaphoretic.  HENT:     Head: Normocephalic and atraumatic.     Jaw: No trismus.     Right Ear: External ear normal.     Left Ear: External ear normal.     Nose: Nose normal.     Mouth/Throat:     Mouth: Mucous membranes are normal.  Eyes:     General: No scleral icterus.    Extraocular Movements: EOM normal.     Conjunctiva/sclera: Conjunctivae normal.  Neck:     Trachea: Phonation normal.  Cardiovascular:     Rate and Rhythm: Normal rate and regular rhythm.  Pulmonary:     Effort: Pulmonary effort is normal. No respiratory distress.     Breath sounds: No stridor.  Abdominal:     General: There is no distension.    Genitourinary:    Testes: Normal.        Right: Tenderness not present.        Left: Tenderness not present.     Comments: Foley in place Urethral meatus fissure. No bleeding or discharge. No erythema or irritation. Reports tenderness to the shaft along the urethra. Musculoskeletal:        General: No edema. Normal range of motion.     Cervical back: Normal range of motion.  Neurological:     Mental Status: He is alert and oriented to person, place, and time.  Psychiatric:        Mood and Affect: Mood and affect normal.        Behavior: Behavior normal.     ED Results and Treatments Labs (all  labs ordered are listed, but only abnormal results are displayed) Labs Reviewed  LACTIC ACID, PLASMA - Abnormal; Notable for the following components:      Result Value   Lactic Acid, Venous 4.2 (*)    All other components within normal limits  LACTIC ACID, PLASMA - Abnormal; Notable for the following components:   Lactic Acid, Venous >11.0 (*)    All other  components within normal limits  COMPREHENSIVE METABOLIC PANEL - Abnormal; Notable for the following components:   Sodium 133 (*)    CO2 12 (*)    Glucose, Bld 59 (*)    Creatinine, Ser 0.50 (*)    Total Protein 3.3 (*)    Albumin 1.2 (*)    AST 10 (*)    All other components within normal limits  CBC WITH DIFFERENTIAL/PLATELET - Abnormal; Notable for the following components:   WBC 10.6 (*)    RBC 1.89 (*)    Hemoglobin 5.2 (*)    HCT 18.3 (*)    MCHC 28.4 (*)    Neutro Abs 9.4 (*)    Lymphs Abs 0.6 (*)    All other components within normal limits  PROTIME-INR - Abnormal; Notable for the following components:   Prothrombin Time 18.8 (*)    INR 1.6 (*)    All other components within normal limits  APTT - Abnormal; Notable for the following components:   aPTT 42 (*)    All other components within normal limits  URINALYSIS, ROUTINE W REFLEX MICROSCOPIC - Abnormal; Notable for the following components:   APPearance TURBID (*)    Ketones, ur 5 (*)    Protein, ur >=300 (*)    Leukocytes,Ua LARGE (*)    WBC, UA >50 (*)    Bacteria, UA MANY (*)    All other components within normal limits  LACTIC ACID, PLASMA - Abnormal; Notable for the following components:   Lactic Acid, Venous 8.7 (*)    All other components within normal limits  I-STAT CHEM 8, ED - Abnormal; Notable for the following components:   BUN 25 (*)    Calcium, Ion 1.80 (*)    Hemoglobin 10.9 (*)    HCT 32.0 (*)    All other components within normal limits  CBG MONITORING, ED - Abnormal; Notable for the following components:   Glucose-Capillary 124 (*)     All other components within normal limits  CULTURE, BLOOD (SINGLE)  URINE CULTURE  RESP PANEL BY RT-PCR (FLU A&B, COVID) ARPGX2  BLOOD GAS, VENOUS  LACTIC ACID, PLASMA  LACTIC ACID, PLASMA  POC OCCULT BLOOD, ED  PREPARE RBC (CROSSMATCH)  TYPE AND SCREEN  ABO/RH                                                                                                                         EKG  EKG Interpretation  Date/Time:  Thursday November 03 2020 01:36:31 EST Ventricular Rate:  109 PR Interval:    QRS Duration: 85 QT Interval:  317 QTC Calculation: 427 R Axis:   82 Text Interpretation: Sinus tachycardia Consider right atrial enlargement Borderline right axis deviation Low voltage, extremity leads Otherwise no significant change Confirmed by Drema Pry [phone]) on 11/03/2020 4:20:35 AM      Radiology DG Chest Port 1 View  Result Date: 11/03/2020 CLINICAL DATA:  Bladder pain and questionable sepsis. EXAM: PORTABLE CHEST 1 VIEW  COMPARISON:  October 10, 2020 FINDINGS: Very mild, stable biapical atelectasis and scarring is seen. There is no evidence of acute infiltrate, pleural effusion or pneumothorax. The heart size and mediastinal contours are within normal limits. The visualized skeletal structures are unremarkable. IMPRESSION: Stable exam without acute or active cardiopulmonary disease. Electronically Signed   By: Aram Candela M.D.   On: 11/03/2020 00:12    Pertinent labs & imaging results that were available during my care of the patient were reviewed by me and considered in my medical decision making (see chart for details).  Medications Ordered in ED Medications  dextrose 10 % and 0.45 % NaCl infusion ( Intravenous New Bag/Given 11/03/20 0452)  0.9 %  sodium chloride infusion (has no administration in time range)  sodium chloride 0.9 % bolus 1,000 mL (1,000 mLs Intravenous New Bag/Given (Non-Interop) 11/03/20 0602)    Followed by  sodium chloride 0.9 % bolus 1,000 mL (has no  administration in time range)    Followed by  0.9 %  sodium chloride infusion (has no administration in time range)  vancomycin (VANCOCIN) IVPB 1000 mg/200 mL premix (has no administration in time range)  lactated ringers bolus 1,000 mL (0 mLs Intravenous Stopped 11/03/20 0602)  acetaminophen (TYLENOL) tablet 1,000 mg (1,000 mg Oral Given 11/03/20 0112)  HYDROmorphone (DILAUDID) injection 0.5 mg (0.5 mg Intravenous Given 11/03/20 0108)  HYDROmorphone (DILAUDID) injection 0.5 mg (0.5 mg Intravenous Given 11/03/20 0315)  sodium chloride 0.9 % bolus 1,000 mL (1,000 mLs Intravenous New Bag/Given 11/03/20 0450)  piperacillin-tazobactam (ZOSYN) IVPB 3.375 g (0 g Intravenous Stopped 11/03/20 0650)  iohexol (OMNIPAQUE) 300 MG/ML solution 100 mL (80 mLs Intravenous Contrast Given 11/03/20 0718)                                                                                                                                    Procedures .1-3 Lead EKG Interpretation Performed by: Nira Conn, MD Authorized by: Nira Conn, MD     Interpretation: normal     ECG rate:  85   ECG rate assessment: normal     Rhythm: sinus rhythm     Ectopy: none     Conduction: normal   .Critical Care Performed by: Nira Conn, MD Authorized by: Nira Conn, MD   Critical care provider statement:    Critical care time (minutes):  90   Critical care start time:  11/02/2020 11:41 PM   Critical care end time:  11/03/2020 6:59 AM   Critical care was necessary to treat or prevent imminent or life-threatening deterioration of the following conditions:  Sepsis and circulatory failure   Critical care was time spent personally by me on the following activities:  Evaluation of patient's response to treatment, examination of patient, ordering and performing treatments and interventions, ordering and review of laboratory studies, ordering and review of radiographic studies, pulse oximetry, re-evaluation  of patient's condition, obtaining  history from patient or surrogate, review of old charts and development of treatment plan with patient or surrogate    (including critical care time)  Medical Decision Making / ED Course I have reviewed the nursing notes for this encounter and the patient's prior records (if available in EHR or on provided paperwork).   Jamie Wilson was evaluated in Emergency Department on 11/03/2020 for the symptoms described in the history of present illness. He was evaluated in the context of the global COVID-19 pandemic, which necessitated consideration that the patient might be at risk for infection with the SARS-CoV-2 virus that causes COVID-19. Institutional protocols and algorithms that pertain to the evaluation of patients at risk for COVID-19 are in a state of rapid change based on information released by regulatory bodies including the CDC and federal and state organizations. These policies and algorithms were followed during the patient's care in the ED.  The patient is here for penile pain related to Foley catheter and possible Foley catheter dysfunction. Noted to be tachycardic.  He is not febrile or hypotensive. Abdominal exam is nontender. GU exam reassuring without evidence of infection or Fournier's.  On record review, patient recently admitted for the work-up above.  UA thought to be infected but did not grow out specific bacteria and antibiotics were discontinued.  Work-up was delayed due to IV access.  Patient started on lactic Ringer's.  Given IV pain medicine. Tachycardia resolved.  Initial work-up notable for improving leukocytosis from recent admission.  Hemoglobin has decreased to 5.2 from 8.5 3 weeks ago.  Hemoccult was negative.  Blood transfusion ordered.   No renal insufficiency.  Patient did have hypoglycemia and started on D10 drip. Bicarb was noted to be low at 12.  Initial lactic acid greater than 11 and repeat lactic acid less than 5.  This  was believed to be due to contamination from lactic Ringer's.  An additional lactic acid was drawn from a different site and increased to 8.7.  UA was unchanged from prior.  Urine culture sent.  Code sepsis was initiated due to the elevated lactic acid and concerning urine. Elevated lactic acid could also be related to necrosing bladder mass. We will obtain a CT scan to assess for possible intra-abdominal infectious process such as abscess and to characterize the bladder mass.  Patient will require admission. Pending repeat lactic acid and CT.   Patient care turned over to Dr Sherry Ruffing. Patient case and results discussed in detail; please see their note for further ED managment.       Final Clinical Impression(s) / ED Diagnoses Final diagnoses:  Elevated lactic acid level  Anemia, unspecified type  Lower urinary tract infectious disease  Malignant neoplasm of urinary bladder, unspecified site [facility])      This chart was dictated using voice recognition software.  Despite best efforts to proofread,  errors can occur which can change the documentation meaning.   Fatima Blank, MD 11/03/20 [phone]

## 2020-11-03 ENCOUNTER — Emergency Department (HOSPITAL_COMMUNITY): Payer: Medicaid Other

## 2020-11-03 ENCOUNTER — Telehealth: Payer: Self-pay | Admitting: Hematology

## 2020-11-03 ENCOUNTER — Encounter (HOSPITAL_COMMUNITY): Payer: Self-pay

## 2020-11-03 ENCOUNTER — Encounter: Payer: Self-pay | Admitting: General Practice

## 2020-11-03 DIAGNOSIS — D649 Anemia, unspecified: Secondary | ICD-10-CM | POA: Diagnosis not present

## 2020-11-03 DIAGNOSIS — M109 Gout, unspecified: Secondary | ICD-10-CM | POA: Diagnosis present

## 2020-11-03 DIAGNOSIS — E46 Unspecified protein-calorie malnutrition: Secondary | ICD-10-CM | POA: Diagnosis not present

## 2020-11-03 DIAGNOSIS — A499 Bacterial infection, unspecified: Secondary | ICD-10-CM | POA: Diagnosis not present

## 2020-11-03 DIAGNOSIS — R7989 Other specified abnormal findings of blood chemistry: Secondary | ICD-10-CM | POA: Diagnosis not present

## 2020-11-03 DIAGNOSIS — E876 Hypokalemia: Secondary | ICD-10-CM | POA: Diagnosis not present

## 2020-11-03 DIAGNOSIS — Z7189 Other specified counseling: Secondary | ICD-10-CM | POA: Diagnosis not present

## 2020-11-03 DIAGNOSIS — N3001 Acute cystitis with hematuria: Secondary | ICD-10-CM | POA: Diagnosis not present

## 2020-11-03 DIAGNOSIS — N390 Urinary tract infection, site not specified: Secondary | ICD-10-CM | POA: Diagnosis present

## 2020-11-03 DIAGNOSIS — I499 Cardiac arrhythmia, unspecified: Secondary | ICD-10-CM | POA: Diagnosis not present

## 2020-11-03 DIAGNOSIS — C679 Malignant neoplasm of bladder, unspecified: Secondary | ICD-10-CM | POA: Diagnosis present

## 2020-11-03 DIAGNOSIS — D630 Anemia in neoplastic disease: Secondary | ICD-10-CM | POA: Diagnosis present

## 2020-11-03 DIAGNOSIS — A419 Sepsis, unspecified organism: Secondary | ICD-10-CM

## 2020-11-03 DIAGNOSIS — Z882 Allergy status to sulfonamides status: Secondary | ICD-10-CM | POA: Diagnosis not present

## 2020-11-03 DIAGNOSIS — Z79899 Other long term (current) drug therapy: Secondary | ICD-10-CM | POA: Diagnosis not present

## 2020-11-03 DIAGNOSIS — R627 Adult failure to thrive: Secondary | ICD-10-CM | POA: Diagnosis present

## 2020-11-03 DIAGNOSIS — F17200 Nicotine dependence, unspecified, uncomplicated: Secondary | ICD-10-CM | POA: Diagnosis present

## 2020-11-03 DIAGNOSIS — Z20822 Contact with and (suspected) exposure to covid-19: Secondary | ICD-10-CM | POA: Diagnosis present

## 2020-11-03 DIAGNOSIS — N179 Acute kidney failure, unspecified: Secondary | ICD-10-CM | POA: Diagnosis not present

## 2020-11-03 DIAGNOSIS — C689 Malignant neoplasm of urinary organ, unspecified: Secondary | ICD-10-CM | POA: Diagnosis not present

## 2020-11-03 DIAGNOSIS — Z681 Body mass index (BMI) 19 or less, adult: Secondary | ICD-10-CM | POA: Diagnosis not present

## 2020-11-03 DIAGNOSIS — D689 Coagulation defect, unspecified: Secondary | ICD-10-CM | POA: Diagnosis present

## 2020-11-03 DIAGNOSIS — Z9119 Patient's noncompliance with other medical treatment and regimen: Secondary | ICD-10-CM | POA: Diagnosis not present

## 2020-11-03 DIAGNOSIS — A4151 Sepsis due to Escherichia coli [E. coli]: Secondary | ICD-10-CM | POA: Diagnosis present

## 2020-11-03 DIAGNOSIS — R21 Rash and other nonspecific skin eruption: Secondary | ICD-10-CM | POA: Diagnosis not present

## 2020-11-03 DIAGNOSIS — Z933 Colostomy status: Secondary | ICD-10-CM | POA: Diagnosis not present

## 2020-11-03 DIAGNOSIS — Z515 Encounter for palliative care: Secondary | ICD-10-CM | POA: Diagnosis not present

## 2020-11-03 DIAGNOSIS — Z1612 Extended spectrum beta lactamase (ESBL) resistance: Secondary | ICD-10-CM | POA: Diagnosis present

## 2020-11-03 DIAGNOSIS — N200 Calculus of kidney: Secondary | ICD-10-CM | POA: Diagnosis present

## 2020-11-03 DIAGNOSIS — E43 Unspecified severe protein-calorie malnutrition: Secondary | ICD-10-CM | POA: Diagnosis present

## 2020-11-03 DIAGNOSIS — I951 Orthostatic hypotension: Secondary | ICD-10-CM | POA: Diagnosis not present

## 2020-11-03 DIAGNOSIS — E162 Hypoglycemia, unspecified: Secondary | ICD-10-CM | POA: Diagnosis present

## 2020-11-03 LAB — LACTIC ACID, PLASMA
Lactic Acid, Venous: 4.2 mmol/L (ref 0.5–1.9)
Lactic Acid, Venous: >11 mmol/L (ref 0.5–1.9)
Lactic Acid, Venous: 8.7 mmol/L (ref 0.5–1.9)
Lactic Acid, Venous: 1.4 mmol/L (ref 0.5–1.9)

## 2020-11-03 LAB — CBG MONITORING, ED: Glucose-Capillary: 124 mg/dL — ABNORMAL HIGH (ref 70–99)

## 2020-11-03 LAB — URINALYSIS, ROUTINE W REFLEX MICROSCOPIC
Bilirubin Urine: NEGATIVE
Glucose, UA: NEGATIVE mg/dL
Hgb urine dipstick: NEGATIVE
Ketones, ur: 5 mg/dL — AB
Nitrite: NEGATIVE
Protein, ur: >=300 mg/dL — AB
Specific Gravity, Urine: 1.019 (ref 1.005–1.030)
WBC, UA: >50 WBC/hpf — ABNORMAL HIGH (ref 0–5)
pH: 7 (ref 5.0–8.0)

## 2020-11-03 LAB — CBC WITH DIFFERENTIAL/PLATELET
Abs Immature Granulocytes: 0.02 10*3/uL (ref 0.00–0.07)
Basophils Absolute: 0 10*3/uL (ref 0.0–0.1)
Basophils Relative: 0 %
Eosinophils Absolute: 0.1 10*3/uL (ref 0.0–0.5)
Eosinophils Relative: 1 %
HCT: 18.3 % — ABNORMAL LOW (ref 39.0–52.0)
Hemoglobin: 5.2 g/dL — CL (ref 13.0–17.0)
Immature Granulocytes: 0 %
Lymphocytes Relative: 6 %
Lymphs Abs: 0.6 10*3/uL — ABNORMAL LOW (ref 0.7–4.0)
MCH: 27.5 pg (ref 26.0–34.0)
MCHC: 28.4 g/dL — ABNORMAL LOW (ref 30.0–36.0)
MCV: 96.8 fL (ref 80.0–100.0)
Monocytes Absolute: 0.4 10*3/uL (ref 0.1–1.0)
Monocytes Relative: 4 %
Neutro Abs: 9.4 10*3/uL — ABNORMAL HIGH (ref 1.7–7.7)
Neutrophils Relative %: 89 %
Platelets: 214 10*3/uL (ref 150–400)
RBC: 1.89 MIL/uL — ABNORMAL LOW (ref 4.22–5.81)
RDW: 14.9 % (ref 11.5–15.5)
WBC: 10.6 10*3/uL — ABNORMAL HIGH (ref 4.0–10.5)
nRBC: 0 % (ref 0.0–0.2)

## 2020-11-03 LAB — APTT: aPTT: 42 seconds — ABNORMAL HIGH (ref 24–36)

## 2020-11-03 LAB — COMPREHENSIVE METABOLIC PANEL
ALT: 6 U/L (ref 0–44)
AST: 10 U/L — ABNORMAL LOW (ref 15–41)
Albumin: 1.2 g/dL — ABNORMAL LOW (ref 3.5–5.0)
Alkaline Phosphatase: 39 U/L (ref 38–126)
Anion gap: 14 (ref 5–15)
BUN: 15 mg/dL (ref 8–23)
CO2: 12 mmol/L — ABNORMAL LOW (ref 22–32)
Calcium: 9 mg/dL (ref 8.9–10.3)
Chloride: 107 mmol/L (ref 98–111)
Creatinine, Ser: 0.5 mg/dL — ABNORMAL LOW (ref 0.61–1.24)
GFR, Estimated: >60 mL/min (ref >60)
Glucose, Bld: 59 mg/dL — ABNORMAL LOW (ref 70–99)
Potassium: 4.2 mmol/L (ref 3.5–5.1)
Sodium: 133 mmol/L — ABNORMAL LOW (ref 135–145)
Total Bilirubin: 0.5 mg/dL (ref 0.3–1.2)
Total Protein: 3.3 g/dL — ABNORMAL LOW (ref 6.5–8.1)

## 2020-11-03 LAB — I-STAT CHEM 8, ED
BUN: 25 mg/dL — ABNORMAL HIGH (ref 8–23)
Calcium, Ion: 1.8 mmol/L (ref 1.15–1.40)
Chloride: 106 mmol/L (ref 98–111)
Creatinine, Ser: 0.8 mg/dL (ref 0.61–1.24)
Glucose, Bld: 90 mg/dL (ref 70–99)
HCT: 32 % — ABNORMAL LOW (ref 39.0–52.0)
Hemoglobin: 10.9 g/dL — ABNORMAL LOW (ref 13.0–17.0)
Potassium: 4.2 mmol/L (ref 3.5–5.1)
Sodium: 136 mmol/L (ref 135–145)
TCO2: 22 mmol/L (ref 22–32)

## 2020-11-03 LAB — BLOOD GAS, VENOUS
Acid-base deficit: 2.8 mmol/L — ABNORMAL HIGH (ref 0.0–2.0)
Bicarbonate: 23.7 mmol/L (ref 20.0–28.0)
FIO2: 21
O2 Saturation: 23.5 %
Patient temperature: 98.6
pCO2, Ven: 53.4 mmHg (ref 44.0–60.0)
pH, Ven: 7.27 (ref 7.250–7.430)
pO2, Ven: <31 mmHg — CL (ref 32.0–45.0)

## 2020-11-03 LAB — PROTIME-INR
INR: 1.6 — ABNORMAL HIGH (ref 0.8–1.2)
Prothrombin Time: 18.8 seconds — ABNORMAL HIGH (ref 11.4–15.2)

## 2020-11-03 LAB — RESP PANEL BY RT-PCR (FLU A&B, COVID) ARPGX2
Influenza A by PCR: NEGATIVE
Influenza B by PCR: NEGATIVE
SARS Coronavirus 2 by RT PCR: NEGATIVE

## 2020-11-03 LAB — PREPARE RBC (CROSSMATCH)

## 2020-11-03 LAB — ABO/RH: ABO/RH(D): O POS

## 2020-11-03 LAB — POC OCCULT BLOOD, ED: Fecal Occult Bld: NEGATIVE

## 2020-11-03 MED ORDER — FOLIC ACID 1 MG PO TABS
1.0000 mg | ORAL_TABLET | Freq: Every day | ORAL | Status: DC
  Administered 2020-11-04 – 2020-12-23 (×49): 1 mg via ORAL
  Filled 2020-11-03 (×50): qty 1

## 2020-11-03 MED ORDER — OXYCODONE HCL 5 MG PO TABS
5.0000 mg | ORAL_TABLET | Freq: Four times a day (QID) | ORAL | Status: DC | PRN
  Administered 2020-11-03 – 2020-11-04 (×3): 5 mg via ORAL
  Filled 2020-11-03 (×3): qty 1

## 2020-11-03 MED ORDER — ACETAMINOPHEN 650 MG RE SUPP: 650.0000 mg | Freq: Four times a day (QID) | RECTAL | Status: DC | PRN

## 2020-11-03 MED ORDER — HYDROMORPHONE HCL 1 MG/ML IJ SOLN
0.5000 mg | Freq: Once | INTRAMUSCULAR | Status: DC
  Filled 2020-11-03: qty 1

## 2020-11-03 MED ORDER — DEXTROSE-NACL 10-0.45 % IV SOLN
INTRAVENOUS | Status: DC
  Filled 2020-11-03 (×6): qty 1000

## 2020-11-03 MED ORDER — B COMPLEX-C PO TABS
1.0000 | ORAL_TABLET | Freq: Every day | ORAL | Status: DC
  Administered 2020-11-04 – 2020-12-23 (×49): 1 via ORAL
  Filled 2020-11-03 (×50): qty 1

## 2020-11-03 MED ORDER — SODIUM CHLORIDE 0.9 % IV BOLUS (SEPSIS)
1000.0000 mL | Freq: Once | INTRAVENOUS | Status: AC
  Administered 2020-11-03: 1000 mL via INTRAVENOUS

## 2020-11-03 MED ORDER — VANCOMYCIN HCL 500 MG/100ML IV SOLN
500.0000 mg | Freq: Two times a day (BID) | INTRAVENOUS | Status: DC
  Administered 2020-11-03 – 2020-11-05 (×4): 500 mg via INTRAVENOUS
  Filled 2020-11-03 (×5): qty 100

## 2020-11-03 MED ORDER — IOHEXOL 300 MG/ML  SOLN
100.0000 mL | Freq: Once | INTRAMUSCULAR | Status: AC | PRN
  Administered 2020-11-03: 80 mL via INTRAVENOUS

## 2020-11-03 MED ORDER — SODIUM CHLORIDE 0.9 % IV SOLN: 10.0000 mL/h | Freq: Once | INTRAVENOUS | Status: DC

## 2020-11-03 MED ORDER — VANCOMYCIN HCL IN DEXTROSE 1-5 GM/200ML-% IV SOLN
1000.0000 mg | Freq: Once | INTRAVENOUS | Status: AC
  Administered 2020-11-03: 1000 mg via INTRAVENOUS
  Filled 2020-11-03: qty 200

## 2020-11-03 MED ORDER — ONDANSETRON HCL 4 MG PO TABS: 4.0000 mg | ORAL_TABLET | Freq: Four times a day (QID) | ORAL | Status: DC | PRN

## 2020-11-03 MED ORDER — SODIUM CHLORIDE 0.9 % IV BOLUS
1000.0000 mL | Freq: Once | INTRAVENOUS | Status: AC
  Administered 2020-11-03: 1000 mL via INTRAVENOUS

## 2020-11-03 MED ORDER — SODIUM CHLORIDE 0.9% FLUSH
10.0000 mL | Freq: Two times a day (BID) | INTRAVENOUS | Status: DC
  Administered 2020-11-03: 10 mL
  Administered 2020-11-03: 20 mL
  Administered 2020-11-04 – 2020-11-08 (×3): 10 mL

## 2020-11-03 MED ORDER — PIPERACILLIN-TAZOBACTAM 4.5 G IVPB: 4.5000 g | Freq: Once | INTRAVENOUS | Status: DC

## 2020-11-03 MED ORDER — IOHEXOL 350 MG/ML SOLN: 100.0000 mL | Freq: Once | INTRAVENOUS | Status: DC | PRN

## 2020-11-03 MED ORDER — SODIUM CHLORIDE 0.9% FLUSH
10.0000 mL | INTRAVENOUS | Status: DC | PRN
  Administered 2020-12-06: 05:00:00 10 mL

## 2020-11-03 MED ORDER — VITAMIN B-12 1000 MCG PO TABS
1000.0000 ug | ORAL_TABLET | Freq: Every day | ORAL | Status: DC
  Administered 2020-11-04 – 2020-12-23 (×48): 1000 ug via ORAL
  Filled 2020-11-03 (×50): qty 1

## 2020-11-03 MED ORDER — SODIUM CHLORIDE 0.9 % IV SOLN
1000.0000 mL | INTRAVENOUS | Status: DC
  Administered 2020-11-03 – 2020-11-05 (×2): 1000 mL via INTRAVENOUS

## 2020-11-03 MED ORDER — HYDROMORPHONE HCL 1 MG/ML IJ SOLN
0.5000 mg | Freq: Once | INTRAMUSCULAR | Status: AC
  Administered 2020-11-03: 0.5 mg via INTRAVENOUS
  Filled 2020-11-03: qty 1

## 2020-11-03 MED ORDER — ONDANSETRON HCL 4 MG/2ML IJ SOLN: 4.0000 mg | Freq: Four times a day (QID) | INTRAMUSCULAR | Status: DC | PRN

## 2020-11-03 MED ORDER — ACETAMINOPHEN 325 MG PO TABS
650.0000 mg | ORAL_TABLET | Freq: Four times a day (QID) | ORAL | Status: DC | PRN
  Administered 2020-11-04 – 2020-11-19 (×5): 650 mg via ORAL
  Filled 2020-11-03 (×6): qty 2

## 2020-11-03 MED ORDER — PIPERACILLIN-TAZOBACTAM 3.375 G IVPB
3.3750 g | Freq: Three times a day (TID) | INTRAVENOUS | Status: DC
  Administered 2020-11-04 – 2020-11-05 (×5): 3.375 g via INTRAVENOUS
  Filled 2020-11-03 (×5): qty 50

## 2020-11-03 MED ORDER — PIPERACILLIN-TAZOBACTAM 3.375 G IVPB 30 MIN
3.3750 g | INTRAVENOUS | Status: AC
  Administered 2020-11-03: 3.375 g via INTRAVENOUS
  Filled 2020-11-03: qty 50

## 2020-11-03 NOTE — ED Provider Notes (Signed)
7:22 AM Care assumed from Dr. Eudelia Bunch.  At time of transfer of care, patient is awaiting results of CT abdomen pelvis, repeat lactic acid, and reassessment.  Patient has already received broad-spectrum antibiotics for sepsis with elevated lactic acid.  If lactic acid is continue to rise, anticipate touching base with critical care however if it is improving and vital signs remain reassuring, anticipate touching base with medicine team for admission.  If any concerning surgical problems are seen on CT, would likely speak to urology.  9:14 AM Spoke to urology who reviewed the case.  He does feel the patient needs admission to medicine for the sepsis from the urine but he will review the case and see the patient that as of now he has a low suspicion patient will need surgery at this time.  As there is no evidence of small bowel obstruction, will hold on general surgery consultation at this time.  Suspect urology is the main surgical team that will need to be involved.  Lactic is improving, will call medicine for admission instead of critical care.   Clinical Impression: 1. Elevated lactic acid level   2. Anemia, unspecified type   3. Lower urinary tract infectious disease   4. Malignant neoplasm of urinary bladder, unspecified site [facility])     Disposition: Admit  This note was prepared with assistance of Dragon voice recognition software. Occasional wrong-word or sound-a-like substitutions may have occurred due to the inherent limitations of voice recognition software.       Eunice Winecoff, Canary Brim, MD 11/03/20 [phone]

## 2020-11-03 NOTE — ED Notes (Signed)
Patient weighs 111.7 lbs

## 2020-11-03 NOTE — Progress Notes (Signed)
A consult was received from an ED physician for vancomycin per pharmacy dosing.  The patient's profile has been reviewed for ht/wt/allergies/indication/available labs.     A one time order has been placed for vancomycin 1000 mg IV once.    Further antibiotics/pharmacy consults should be ordered by admitting physician if indicated.                       Thank you, Cindi Carbon, PharmD 11/03/2020  6:37 AM

## 2020-11-03 NOTE — Progress Notes (Signed)
Pharmacy Antibiotic Note  Jamie Wilson is a 65 y.o. male admitted on 11/02/2020 with sepsis.  Pharmacy has been consulted for vancomycin & zosyn dosing. Pt with bladder cancer and indwelling foley catheter. Hypothermic. WBC 10.6. SCr WNL.      Plan: Zosyn 3.375g IV q8h (4 hour infusion).  Vancomycin 1000 mg IV x 1 then vancomycin 500 mg IV q12 AUC ~463, Css max/min 26.9/13.6, TBW for CrCl, Vd 0.72, SCr 0.8 F/u renal function, WBC, temp, culture data Daily SCr while on both vancomycin & zosyn Vancomycin trough as needed  Height: 6\' 1"  (185.4 cm) Weight: 50.7 kg (111 lb 11.2 oz) IBW/kg (Calculated) : 79.9  Temp (24hrs), Avg:97.5 F (36.4 C), Min:97.5 F (36.4 C), Max:97.5 F (36.4 C)  Recent Labs  Lab 11/03/20 0230 11/03/20 0325 11/03/20 0337 11/03/20 0455 11/03/20 0836  WBC 10.6*  --   --   --   --   CREATININE 0.50*  --  0.80  --   --   LATICACIDVEN >11.0* 4.2*  --  8.7* 1.4    Estimated Creatinine Clearance: 66 mL/min (by C-G formula based on SCr of 0.8 mg/dL).    Allergies  Allergen Reactions  . Silvadene [Silver Sulfadiazine] Rash    Antimicrobials this admission: 1/6 vanc>> 1/6 zosyn>> Dose adjustments this admission:  Microbiology results: 1/6 covid/flu neg 1/6 UCx: sent 1/6 BCx1: ngtd  Thank you for allowing pharmacy to be a part of this patient's care.  01/01/21, Pharm.D 11/03/2020 1:52 PM

## 2020-11-03 NOTE — ED Notes (Signed)
Warm blankets given to patient.

## 2020-11-03 NOTE — Progress Notes (Signed)
[facility] CSW Progress Notes  [facility] Support Team received urgent referral for this patient - have made multiple unsuccessful attempts to contact him to assess for needs/resources.  Will defer needs assessment to inpatient [facility] team, as we are unable to contact patient.  Santa Genera, LCSW Clinical Social Worker Phone:  [phone]

## 2020-11-03 NOTE — Telephone Encounter (Signed)
Scheduled per 01/04 los, called regarding upcoming scheduled appointments. Patient is currently still in the hospital.

## 2020-11-03 NOTE — Sepsis Progress Note (Signed)
Notified provider of need to order repeat lactic acid. ° °

## 2020-11-03 NOTE — ED Notes (Signed)
Date and time results received: 11/03/20 3:31 AM (use smartphrase ".now" to insert current time)  Test: Hemoglobin Critical Value: 5.2  Name of Provider Notified: [place]   Orders Received? Or Actions Taken?: Orders Received - See Orders for details

## 2020-11-03 NOTE — H&P (Signed)
History and Physical    Jamie Wilson [id number] DOB: May 02, 1956 DOA: 11/02/2020  PCP: Patient, [name]  Patient coming from: Home  Chief Complaint: abdominal pain  HPI: Jamie Wilson is a 65 y.o. 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 Presenting with pain in the lower abdomen/suprapubic area. It has been present for several months, but has worsened over the last week. He has known bladder cancer apparently with some necrotic tumor and is seen by urology and oncology. He tried using his normal home pain regimen, but it hasn't helped. He has had poor PO intake and increased fatigue during this time. He called his onco group about this yesterday and they recommended he come to the ED. He reports no other alleviating or aggravating factors.     ED Course: Jamie Wilson has been all over the place. Lactic acid initially high at 11.4. Most recent is 1.4 after fluids. Found to be anemic with a Hgb of 5.2 and negative FOBT. But repeat H&H was 10.9 before blood given. He was given fluids and started on broad spec abx. Urology was consulted. [facility] was called for admission.   Review of Systems:  Denies CP, palpitations, dyspnea, HA. Reports fatigue, N. Review of systems is otherwise negative for all not mentioned in HPI.   PMHx Past Medical History:  Diagnosis Date  . Colostomy care ([place])   . Toe infection     PSHx Past Surgical History:  Procedure Laterality Date  . ABDOMINAL SURGERY    . CYSTOSCOPY/URETEROSCOPY/HOLMIUM LASER/STENT PLACEMENT Right 10/06/2020   Procedure: CYSTO;  Surgeon: Irine Seal, MD;  Location: [facility];  Service: Urology;  Laterality: Right;  . TRANSURETHRAL RESECTION OF BLADDER TUMOR N/A 10/06/2020   Procedure: POSSIBLE TRANSURETHRAL RESECTION OF BLADDER TUMOR (TURBT);  Surgeon: Irine Seal, MD;  Location: [facility];  Service: Urology;  Laterality: N/A;    SocHx  reports that he has  been smoking. He has never used smokeless tobacco. He reports current alcohol use. He reports current drug use. Drug: Marijuana.  Allergies  Allergen Reactions  . Silvadene [Silver Sulfadiazine] Rash    FamHx Family History  Problem Relation Age of Onset  . CAD Neg Hx   . Heart failure Neg Hx     Prior to Admission medications   Medication Sig Start Date End Date Taking? Authorizing Provider  acetaminophen (TYLENOL) 500 MG tablet Take 2 tablets (1,000 mg total) by mouth every 8 (eight) hours. Patient taking differently: Take 1,000 mg by mouth every 8 (eight) hours as needed for mild pain, moderate pain, fever or headache. 10/13/20  Yes Pokhrel, Laxman, MD  B Complex-C (B-COMPLEX WITH VITAMIN C) tablet Take 1 tablet by mouth daily. 10/13/20  Yes Pokhrel, Laxman, MD  folic acid (FOLVITE) 1 MG tablet Take 1 tablet (1 mg total) by mouth daily. 10/13/20  Yes Pokhrel, Laxman, MD  tamsulosin (FLOMAX) 0.4 MG CAPS capsule Take 1 capsule (0.4 mg total) by mouth daily. 08/28/20  Yes Barb Merino, MD  vitamin B-12 1000 MCG tablet Take 1 tablet (1,000 mcg total) by mouth daily. 10/13/20  Yes Pokhrel, Laxman, MD  bisacodyl (DULCOLAX) 5 MG EC tablet Take 1 tablet (5 mg total) by mouth daily as needed for moderate constipation. Patient not taking: Reported on 11/03/2020 10/13/20   Pokhrel, Corrie Mckusick, MD  oxyCODONE (OXY IR/ROXICODONE) 5 MG immediate release tablet Take 1-2 tablets (5-10 mg total) by mouth every 6 (six) hours as  needed for moderate pain or severe pain (5 moderate, 10 severe). Patient not taking: Reported on 11/03/2020 10/13/20   Pokhrel, Corrie Mckusick, MD  polyethylene glycol (MIRALAX / GLYCOLAX) 17 g packet Take 17 g by mouth 2 (two) times daily. Patient not taking: No sig reported 08/28/20   Barb Merino, MD  senna-docusate (SENOKOT-S) 8.6-50 MG tablet Take 1 tablet by mouth 2 (two) times daily. Patient not taking: Reported on 11/03/2020 10/13/20   Flora Lipps, MD    Physical Exam: Vitals:    11/03/20 0930 11/03/20 0945 11/03/20 1000 11/03/20 1015  BP:   111/69   Pulse: 67 78 80 84  Resp: 17 14 12 13   Temp:      TempSrc:      SpO2: 100% 98% 95% 99%  Weight:      Height:        General: 65 y.o. ill appearing male resting in bed in NAD Eyes: PERRL, normal sclera ENMT: Nares patent w/o discharge, orophaynx clear, dentition normal, ears w/o discharge/lesions/ulcers Neck: Supple, trachea midline Cardiovascular: RRR, +S1, S2, no m/g/r, equal pulses throughout Respiratory: decreased at bases, no w/r/r, normal WOB on RA GI: BS+, ND, suprapubic TTP, no masses noted, no organomegaly noted MSK: No e/c/c Skin: No rashes, bruises, ulcerations noted Neuro: A&O x 3, no focal deficits Psyc: Appropriate interaction but flat affect, calm/cooperative  Labs on Admission: I have personally reviewed following labs and imaging studies  CBC: Recent Labs  Lab 11/03/20 0230 11/03/20 0337  WBC 10.6*  --   NEUTROABS 9.4*  --   HGB 5.2* 10.9*  HCT 18.3* 32.0*  MCV 96.8  --   PLT 214  --    Basic Metabolic Panel: Recent Labs  Lab 11/03/20 0230 11/03/20 0337  NA 133* 136  K 4.2 4.2  CL 107 106  CO2 12*  --   GLUCOSE 59* 90  BUN 15 25*  CREATININE 0.50* 0.80  CALCIUM 9.0  --    GFR: Estimated Creatinine Clearance: 66 mL/min (by C-G formula based on SCr of 0.8 mg/dL). Liver Function Tests: Recent Labs  Lab 11/03/20 0230  AST 10*  ALT 6  ALKPHOS 39  BILITOT 0.5  PROT 3.3*  ALBUMIN 1.2*   No results for input(s): LIPASE, AMYLASE in the last 168 hours. No results for input(s): AMMONIA in the last 168 hours. Coagulation Profile: Recent Labs  Lab 11/03/20 0230  INR 1.6*   Cardiac Enzymes: No results for input(s): CKTOTAL, CKMB, CKMBINDEX, TROPONINI in the last 168 hours. BNP (last 3 results) No results for input(s): PROBNP in the last 8760 hours. HbA1C: No results for input(s): HGBA1C in the last 72 hours. CBG: Recent Labs  Lab 11/03/20 0632  GLUCAP 124*    Lipid Profile: No results for input(s): CHOL, HDL, LDLCALC, TRIG, CHOLHDL, LDLDIRECT in the last 72 hours. Thyroid Function Tests: No results for input(s): TSH, T4TOTAL, FREET4, T3FREE, THYROIDAB in the last 72 hours. Anemia Panel: No results for input(s): VITAMINB12, FOLATE, FERRITIN, TIBC, IRON, RETICCTPCT in the last 72 hours. Urine analysis:    Component Value Date/Time   COLORURINE YELLOW 11/03/2020 0330   APPEARANCEUR TURBID (A) 11/03/2020 0330   LABSPEC 1.019 11/03/2020 0330   PHURINE 7.0 11/03/2020 0330   GLUCOSEU NEGATIVE 11/03/2020 0330   HGBUR NEGATIVE 11/03/2020 0330   BILIRUBINUR NEGATIVE 11/03/2020 0330   KETONESUR 5 (A) 11/03/2020 0330   PROTEINUR >=300 (A) 11/03/2020 0330   NITRITE NEGATIVE 11/03/2020 0330   LEUKOCYTESUR LARGE (A) 11/03/2020 0330  Radiological Exams on Admission: CT ABDOMEN PELVIS W CONTRAST  Result Date: 11/03/2020 CLINICAL DATA:  Abdominal pain.  Bladder cancer. EXAM: CT ABDOMEN AND PELVIS WITH CONTRAST TECHNIQUE: Multidetector CT imaging of the abdomen and pelvis was performed using the standard protocol following bolus administration of intravenous contrast. CONTRAST:  49mL OMNIPAQUE IOHEXOL 300 MG/ML  SOLN COMPARISON:  10/08/2020 FINDINGS: Lower chest:  No acute finding Hepatobiliary: Multiple hepatic cysts measuring up to 5.4 cm in the right upper lobe.No evidence of biliary obstruction or stone. Pancreas: Unremarkable. Spleen: Unremarkable. Adrenals/Urinary Tract: Negative adrenals. No hydronephrosis or ureteral stone. Multiple renal calculi, more numerous on the right where the largest stone measures 14 mm. Right renal cortical scarring asymmetric to the right. Bulky necrotic tumor at the dome of the bladder with high-density in the bladder lumen likely reflecting both clot and calcification. The confluent mass measures at least 9 cm craniocaudal. Foley catheter is in expected position. The mass is intimately associated with small bowel loops in  the pelvis, without bowel obstruction or demonstrable fistulization. Stomach/Bowel: Descending colostomy. No bowel obstruction or visible inflammation. Vascular/Lymphatic: No acute vascular abnormality. Atherosclerosis of the aorta and iliacs. There are a few pelvic lymph nodes which show prominent enhancement but are strictly nonenlarged. An 8 mm node along the right iliac chain on 2:66 is an example. No para-aortic adenopathy is seen. Reproductive:Unremarkable Other: No ascites or pneumoperitoneum. Musculoskeletal: No acute abnormalities. IMPRESSION: 1. Bulky bladder mass with progression from 10/08/2020. The mass appears necrotic with complex material in the bladder lumen. Foley catheter is normally positioned. 2. The mass is intimately associated with pelvic small bowel loops. Fistulization is possible, especially given the degree of bladder gas, but not clearly demonstrated. Please correlate with Foley catheter output. No small bowel obstruction. 3. Possible early nodal metastatic disease in the pelvis. 4. Right renal atrophy and multiple calculi. Electronically Signed   By: Monte Fantasia M.D.   On: 11/03/2020 07:57   DG Chest Port 1 View  Result Date: 11/03/2020 CLINICAL DATA:  Bladder pain and questionable sepsis. EXAM: PORTABLE CHEST 1 VIEW COMPARISON:  October 10, 2020 FINDINGS: Very mild, stable biapical atelectasis and scarring is seen. There is no evidence of acute infiltrate, pleural effusion or pneumothorax. The heart size and mediastinal contours are within normal limits. The visualized skeletal structures are unremarkable. IMPRESSION: Stable exam without acute or active cardiopulmonary disease. Electronically Signed   By: Virgina Norfolk M.D.   On: 11/03/2020 00:12    EKG: Independently reviewed. Sinus tach  Assessment/Plan Sepsis secondary to UTI and likely infected necrotic bladder mass     - admit to inpt, progressive     - CT ab/pelvis w/ necrotic bladder mass      - continue  broad spec abx for now     - follow up UCx, Bld Cx     - continue fluids     - urology consulted; appreciate assistance     - palliative care consult  Severe protein-calorie malnutrition     - dietary consult  Normocytic anemia Coagulopathy     - his Hgb is all over the place 5.2 - 10.9; two units were ordered in the ED. No blood seen in urine and FOBT is negative     - first labs were probably an error; trend for now     - of note INR is up w/o liver failure; trend INR for right now  Hypoglycemia     - continue D10 and encourage diet     -  follow glucose   DVT prophylaxis: SCDs  Code Status: FULL  Family Communication: None at bedside.   Consults called: EDP to urology. Palliative Care consult. Have notified On[facility] he is in the hospital.  Status is: Inpatient  Remains inpatient appropriate because:Inpatient level of care appropriate due to severity of illness   Dispo: The patient is from: Home              Anticipated d/c is to: Home              Anticipated d/c date is: 3 days              Patient currently is not medically stable to d/c.  Teddy Spike DO [facility]  If 7PM-7AM, please contact night-coverage [email or web address]  11/03/2020, 10:28 AM

## 2020-11-03 NOTE — Sepsis Progress Note (Signed)
Monitoring for sepsis protocol. °

## 2020-11-03 NOTE — Consult Note (Signed)
Urology Consult   Physician requesting consult: Cherylann Ratel, DO  Reason for consult: Urothelial carcinoma of the bladder  History of Present Illness: Jamie Wilson is a 65 y.o. 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  He is s/p TURBT large with Dr. Jeffie Pollock on 10/06/2020.  At the time, he was found to have a very large necrotic bladder tumor at the dome, anterior and posterior walls of the bladder.  CT A/P 10/02/2020 revealed heterogeneous soft tissue mass extending from the bladder superiorly which possibly had a fistulous connection to the small bowel and colonic loops.  CT chest 10/11/2020 revealed borderline enlarged mediastinal and hilar lymph nodes, likely related to emphysema.  There is no finding of pulmonary metastatic disease.  CT A/P 11/03/2020 reveals bulky bladder mass with progression from 10/08/2020 with necrotic and complex material in the lumen.  Again, there is a possible rate for fistulization given the degree of gas in the bladder.  Was also read as possible early nodal metastasis disease in the pelvis given a few pelvic lymph nodes showing prominent enhancement with maximum size of 8 mm.  He is also found to have bilateral renal stones which are nonobstructing, no hydronephrosis.  The largest stone measures 14 mm on the right.  Patient states that since he was discharged.  He has been unable to follow-up in the office.  He actually no showed at a visit on 10/14/2020.   He states that his Foley catheter has been intermittently draining foul-smelling material.  He does complain of lower abdominal pain/suprapubic pain which is now better controlled after his Foley catheter has been draining more appropriately.  He denies any fevers or chills.  He does note decreased p.o. intake.  He also states he has been more fatigued.  He does not notice any recent weight loss.  In the ED, he  was initially found to have a hemoglobin of 5.2.  I suspect that this is aberrant as a stat repeat was 10.9.  He denies any recent hematuria.  His creatinine is appropriately normal at 0.8.  WBC is only 10.6.  Past Medical History:  Diagnosis Date   Colostomy care [facility])    Toe infection     Past Surgical History:  Procedure Laterality Date   ABDOMINAL SURGERY     CYSTOSCOPY/URETEROSCOPY/HOLMIUM LASER/STENT PLACEMENT Right 10/06/2020   Procedure: CYSTO;  Surgeon: Irine Seal, MD;  Location: [facility];  Service: Urology;  Laterality: Right;   TRANSURETHRAL RESECTION OF BLADDER TUMOR N/A 10/06/2020   Procedure: POSSIBLE TRANSURETHRAL RESECTION OF BLADDER TUMOR (TURBT);  Surgeon: Irine Seal, MD;  Location: [facility];  Service: Urology;  Laterality: N/A;    Medications:  Home meds:  No current facility-administered medications on file prior to encounter.   Current Outpatient Medications on File Prior to Encounter  Medication Sig Dispense Refill   acetaminophen (TYLENOL) 500 MG tablet Take 2 tablets (1,000 mg total) by mouth every 8 (eight) hours. (Patient taking differently: Take 1,000 mg by mouth every 8 (eight) hours as needed for mild pain, moderate pain, fever or headache.) 30 tablet 0   B Complex-C (B-COMPLEX WITH VITAMIN C) tablet Take 1 tablet by mouth daily. [id number] tablet 0   folic acid (FOLVITE) 1 MG tablet Take 1 tablet (1 mg total) by mouth daily. 100 tablet 0   tamsulosin (FLOMAX) 0.4 MG CAPS capsule Take 1 capsule (0.4 mg total) by mouth daily. 30 capsule  2   vitamin B-12 1000 MCG tablet Take 1 tablet (1,000 mcg total) by mouth daily. 100 tablet 0   bisacodyl (DULCOLAX) 5 MG EC tablet Take 1 tablet (5 mg total) by mouth daily as needed for moderate constipation. (Patient not taking: Reported on 11/03/2020) 30 tablet 0   oxyCODONE (OXY IR/ROXICODONE) 5 MG immediate release tablet Take 1-2 tablets (5-10 mg total) by mouth every 6 (six) hours as needed for moderate pain or severe  pain (5 moderate, 10 severe). (Patient not taking: Reported on 11/03/2020) 20 tablet 0   polyethylene glycol (MIRALAX / GLYCOLAX) 17 g packet Take 17 g by mouth 2 (two) times daily. (Patient not taking: No sig reported) 14 each 0   senna-docusate (SENOKOT-S) 8.6-50 MG tablet Take 1 tablet by mouth 2 (two) times daily. (Patient not taking: Reported on 11/03/2020) 60 tablet 2     Scheduled Meds:  sodium chloride flush  10-40 mL Intracatheter Q12H   Continuous Infusions:  sodium chloride     sodium chloride 1,000 mL (11/03/20 1636)   dextrose 10 % and 0.45 % NaCl 75 mL/hr at 11/03/20 0452   piperacillin-tazobactam (ZOSYN)  IV     vancomycin     PRN Meds:.acetaminophen **OR** acetaminophen, oxyCODONE, sodium chloride flush  Allergies:  Allergies  Allergen Reactions   Silvadene [Silver Sulfadiazine] Rash    Family History  Problem Relation Age of Onset   CAD Neg Hx    Heart failure Neg Hx     Social History:  reports that he has been smoking. He has never used smokeless tobacco. He reports current alcohol use. He reports current drug use. Drug: Marijuana.  ROS: A complete review of systems was performed.  All systems are negative except for pertinent findings as noted.  Physical Exam:  Vital signs in last 24 hours: Temp:  [97.5 F (36.4 C)] 97.5 F (36.4 C) (01/06 1444) Pulse Rate:  [58-120] 69 (01/06 1600) Resp:  [10-28] 14 (01/06 1600) BP: (86-130)/(51-85) 104/55 (01/06 1600) SpO2:  [94 %-100 %] 100 % (01/06 1600) Weight:  [50.7 kg] 50.7 kg (01/06 0852) Constitutional:  Alert and oriented, No acute distress Cardiovascular: Regular rate and rhythm Respiratory: Normal respiratory effort, Lungs clear bilaterally GI: Abdomen is soft, nontender, nondistended, no abdominal masses Genitourinary: Foley in place draining purulent urine Neurologic: Grossly intact, no focal deficits Psychiatric: Normal mood and affect  Laboratory Data:  Recent Labs    11/03/20 0230  11/03/20 0337  WBC 10.6*  --   HGB 5.2* 10.9*  HCT 18.3* 32.0*  PLT 214  --     Recent Labs    11/03/20 0230 11/03/20 0337  NA 133* 136  K 4.2 4.2  CL 107 106  GLUCOSE 59* 90  BUN 15 25*  CALCIUM 9.0  --   CREATININE 0.50* 0.80     Results for orders placed or performed during the hospital encounter of 11/02/20 (from the past 24 hour(s))  Lactic acid, plasma     Status: Abnormal   Collection Time: 11/03/20  2:30 AM  Result Value Ref Range   Lactic Acid, Venous >11.0 (HH) 0.5 - 1.9 mmol/L  Comprehensive metabolic panel     Status: Abnormal   Collection Time: 11/03/20  2:30 AM  Result Value Ref Range   Sodium 133 (L) 135 - 145 mmol/L   Potassium 4.2 3.5 - 5.1 mmol/L   Chloride 107 98 - 111 mmol/L   CO2 12 (L) 22 - 32 mmol/L   Glucose, Bld  59 (L) 70 - 99 mg/dL   BUN 15 8 - 23 mg/dL   Creatinine, Ser 1.610.50 (L) 0.61 - 1.24 mg/dL   Calcium 9.0 8.9 - 09.610.3 mg/dL   Total Protein 3.3 (L) 6.5 - 8.1 g/dL   Albumin 1.2 (L) 3.5 - 5.0 g/dL   AST 10 (L) 15 - 41 U/L   ALT 6 0 - 44 U/L   Alkaline Phosphatase 39 38 - 126 U/L   Total Bilirubin 0.5 0.3 - 1.2 mg/dL   GFR, Estimated >04>60 >54>60 mL/min   Anion gap 14 5 - 15  CBC WITH DIFFERENTIAL     Status: Abnormal   Collection Time: 11/03/20  2:30 AM  Result Value Ref Range   WBC 10.6 (H) 4.0 - 10.5 K/uL   RBC 1.89 (L) 4.22 - 5.81 MIL/uL   Hemoglobin 5.2 (LL) 13.0 - 17.0 g/dL   HCT 09.818.3 (L) 11.939.0 - 14.752.0 %   MCV 96.8 80.0 - 100.0 fL   MCH 27.5 26.0 - 34.0 pg   MCHC 28.4 (L) 30.0 - 36.0 g/dL   RDW 82.914.9 56.211.5 - 13.015.5 %   Platelets 214 150 - 400 K/uL   nRBC 0.0 0.0 - 0.2 %   Neutrophils Relative % 89 %   Neutro Abs 9.4 (H) 1.7 - 7.7 K/uL   Lymphocytes Relative 6 %   Lymphs Abs 0.6 (L) 0.7 - 4.0 K/uL   Monocytes Relative 4 %   Monocytes Absolute 0.4 0.1 - 1.0 K/uL   Eosinophils Relative 1 %   Eosinophils Absolute 0.1 0.0 - 0.5 K/uL   Basophils Relative 0 %   Basophils Absolute 0.0 0.0 - 0.1 K/uL   Immature Granulocytes 0 %   Abs  Immature Granulocytes 0.02 0.00 - 0.07 K/uL  Protime-INR     Status: Abnormal   Collection Time: 11/03/20  2:30 AM  Result Value Ref Range   Prothrombin Time 18.8 (H) 11.4 - 15.2 seconds   INR 1.6 (H) 0.8 - 1.2  APTT     Status: Abnormal   Collection Time: 11/03/20  2:30 AM  Result Value Ref Range   aPTT 42 (H) 24 - 36 seconds  ABO/Rh     Status: None   Collection Time: 11/03/20  2:30 AM  Result Value Ref Range   ABO/RH(D)      O POS Performed at [facility], [address] [place], [place] [address]   Blood culture (routine single)     Status: None (Preliminary result)   Collection Time: 11/03/20  2:53 AM   Specimen: BLOOD LEFT WRIST  Result Value Ref Range   Specimen Description      BLOOD LEFT WRIST Performed at [facility], [address] [place], [place] [address]    Special Requests      BOTTLES DRAWN AEROBIC AND ANAEROBIC Blood Culture results may not be optimal due to an inadequate volume of blood received in culture bottles Performed at [facility], [address] [place], [place] [address]    Culture      NO GROWTH < 12 HOURS Performed at [facility], [address] [place], [place] [address]    Report Status PENDING   Lactic acid, plasma     Status: Abnormal   Collection Time: 11/03/20  3:25 AM  Result Value Ref Range   Lactic Acid, Venous 4.2 (HH) 0.5 - 1.9 mmol/L  Urinalysis, Routine w reflex microscopic     Status:  Abnormal   Collection Time: 11/03/20  3:30 AM  Result Value Ref Range   Color, Urine YELLOW YELLOW   APPearance TURBID (A) CLEAR   Specific Gravity, Urine 1.019 1.005 - 1.030   pH 7.0 5.0 - 8.0   Glucose, UA NEGATIVE NEGATIVE mg/dL   Hgb urine dipstick NEGATIVE NEGATIVE   Bilirubin Urine NEGATIVE NEGATIVE   Ketones, ur 5 (A) NEGATIVE mg/dL   Protein, ur >=622 (A) NEGATIVE mg/dL   Nitrite NEGATIVE NEGATIVE   Leukocytes,Ua LARGE (A) NEGATIVE   RBC / HPF 0-5 0 - 5 RBC/hpf   WBC, UA >50  (H) 0 - 5 WBC/hpf   Bacteria, UA MANY (A) NONE SEEN   Mucus PRESENT   I-Stat Chem 8, ED     Status: Abnormal   Collection Time: 11/03/20  3:37 AM  Result Value Ref Range   Sodium 136 135 - 145 mmol/L   Potassium 4.2 3.5 - 5.1 mmol/L   Chloride 106 98 - 111 mmol/L   BUN 25 (H) 8 - 23 mg/dL   Creatinine, Ser 2.97 0.61 - 1.24 mg/dL   Glucose, Bld 90 70 - 99 mg/dL   Calcium, Ion 9.89 (HH) 1.15 - 1.40 mmol/L   TCO2 22 22 - 32 mmol/L   Hemoglobin 10.9 (L) 13.0 - 17.0 g/dL   HCT 21.1 (L) 94.1 - 74.0 %   Comment NOTIFIED PHYSICIAN   POC occult blood, ED Provider will collect     Status: None   Collection Time: 11/03/20  4:17 AM  Result Value Ref Range   Fecal Occult Bld NEGATIVE NEGATIVE  Lactic acid, plasma     Status: Abnormal   Collection Time: 11/03/20  4:55 AM  Result Value Ref Range   Lactic Acid, Venous 8.7 (HH) 0.5 - 1.9 mmol/L  Type and screen Ordered by PROVIDER DEFAULT     Status: None (Preliminary result)   Collection Time: 11/03/20  4:55 AM  Result Value Ref Range   ABO/RH(D) O POS    Antibody Screen NEG    Sample Expiration 11/06/2020,2359    Unit Number [id number]    Blood Component Type RED CELLS,LR    Unit division 00    Status of Unit ALLOCATED    Transfusion Status OK TO TRANSFUSE    Crossmatch Result Compatible    Unit Number [id number]    Blood Component Type RED CELLS,LR    Unit division 00    Status of Unit ISSUED    Transfusion Status OK TO TRANSFUSE    Crossmatch Result      Compatible Performed at [facility], [address] [place], [place] [address]   Prepare RBC (crossmatch)     Status: None   Collection Time: 11/03/20  5:00 AM  Result Value Ref Range   Order Confirmation      ORDER PROCESSED BY BLOOD BANK Performed at [facility], [address] [place], [place] [address]   POC CBG, ED     Status: Abnormal   Collection Time: 11/03/20  6:32 AM  Result Value Ref Range   Glucose-Capillary 124  (H) 70 - 99 mg/dL  Blood gas, venous (at [facility], not at [facility])     Status: Abnormal   Collection Time: 11/03/20  8:36 AM  Result Value Ref Range   FIO2 21.00    pH, Ven 7.270 7.250 - 7.430   pCO2, Ven 53.4 44.0 - 60.0 mmHg   pO2, Ven <31.0 (LL) 32.0 - 45.0  mmHg   Bicarbonate 23.7 20.0 - 28.0 mmol/L   Acid-base deficit 2.8 (H) 0.0 - 2.0 mmol/L   O2 Saturation 23.5 %   Patient temperature 98.6   Lactic acid, plasma     Status: None   Collection Time: 11/03/20  8:36 AM  Result Value Ref Range   Lactic Acid, Venous 1.4 0.5 - 1.9 mmol/L  Resp Panel by RT-PCR (Flu A&B, Covid) Nasopharyngeal Swab     Status: None   Collection Time: 11/03/20 10:20 AM   Specimen: Nasopharyngeal Swab; Nasopharyngeal(NP) swabs in vial transport medium  Result Value Ref Range   SARS Coronavirus 2 by RT PCR NEGATIVE NEGATIVE   Influenza A by PCR NEGATIVE NEGATIVE   Influenza B by PCR NEGATIVE NEGATIVE   Recent Results (from the past 240 hour(s))  Blood culture (routine single)     Status: None (Preliminary result)   Collection Time: 11/03/20  2:53 AM   Specimen: BLOOD LEFT WRIST  Result Value Ref Range Status   Specimen Description   Final    BLOOD LEFT WRIST Performed at [facility], [address], [place], [place] [address]    Special Requests   Final    BOTTLES DRAWN AEROBIC AND ANAEROBIC Blood Culture results may not be optimal due to an inadequate volume of blood received in culture bottles Performed at [facility] [address] [place], [place] [address]    Culture   Final    NO GROWTH < 12 HOURS Performed at [facility] [address], [place], [place] [address]    Report Status PENDING  Incomplete  Resp Panel by RT-PCR (Flu A&B, Covid) Nasopharyngeal Swab     Status: None   Collection Time: 11/03/20 10:20 AM   Specimen: Nasopharyngeal Swab; Nasopharyngeal(NP) swabs in vial transport medium  Result Value Ref Range Status   SARS Coronavirus 2 by RT PCR  NEGATIVE NEGATIVE Final    Comment: (NOTE) SARS-CoV-2 target nucleic acids are NOT DETECTED.  The SARS-CoV-2 RNA is generally detectable in upper respiratory specimens during the acute phase of infection. The lowest concentration of SARS-CoV-2 viral copies this assay can detect is 138 copies/mL. A negative result does not preclude SARS-Cov-2 infection and should not be used as the sole basis for treatment or other patient management decisions. A negative result may occur with  improper specimen collection/handling, submission of specimen other than nasopharyngeal swab, presence of viral mutation(s) within the areas targeted by this assay, and inadequate number of viral copies(<138 copies/mL). A negative result must be combined with clinical observations, patient history, and epidemiological information. The expected result is Negative.  Fact Sheet for Patients:  EntrepreneurPulse.com.au  Fact Sheet for Healthcare Providers:  IncredibleEmployment.be  This test is no t yet approved or cleared by the [place] FDA and  has been authorized for detection and/or diagnosis of SARS-CoV-2 by FDA under an Emergency Use Authorization (EUA). This EUA will remain  in effect (meaning this test can be used) for the duration of the COVID-19 declaration under Section 564(b)(1) of the Act, 21 U.S.C.section 360bbb-3(b)(1), unless the authorization is terminated  or revoked sooner.       Influenza A by PCR NEGATIVE NEGATIVE Final   Influenza B by PCR NEGATIVE NEGATIVE Final    Comment: (NOTE) The Xpert Xpress SARS-CoV-2/FLU/RSV plus assay is intended as an aid in the diagnosis of influenza from Nasopharyngeal swab specimens and should not be used as a sole basis for treatment. Nasal washings  and aspirates are unacceptable for Xpert Xpress SARS-CoV-2/FLU/RSV testing.  Fact Sheet for Patients: BloggerCourse.com  Fact Sheet for  Healthcare Providers: SeriousBroker.it  This test is not yet approved or cleared by the [place] FDA and has been authorized for detection and/or diagnosis of SARS-CoV-2 by FDA under an Emergency Use Authorization (EUA). This EUA will remain in effect (meaning this test can be used) for the duration of the COVID-19 declaration under Section 564(b)(1) of the Act, 21 U.S.C. section 360bbb-3(b)(1), unless the authorization is terminated or revoked.  Performed at [facility], [address] [place], [place] [address]     Renal Function: Recent Labs    11/03/20 0230 11/03/20 6387  CREATININE 0.50* 0.80   Estimated Creatinine Clearance: 66 mL/min (by C-G formula based on SCr of 0.8 mg/dL).  Radiologic Imaging: CT ABDOMEN PELVIS W CONTRAST  Result Date: 11/03/2020 CLINICAL DATA:  Abdominal pain.  Bladder cancer. EXAM: CT ABDOMEN AND PELVIS WITH CONTRAST TECHNIQUE: Multidetector CT imaging of the abdomen and pelvis was performed using the standard protocol following bolus administration of intravenous contrast. CONTRAST:  24mL OMNIPAQUE IOHEXOL 300 MG/ML  SOLN COMPARISON:  10/08/2020 FINDINGS: Lower chest:  No acute finding Hepatobiliary: Multiple hepatic cysts measuring up to 5.4 cm in the right upper lobe.No evidence of biliary obstruction or stone. Pancreas: Unremarkable. Spleen: Unremarkable. Adrenals/Urinary Tract: Negative adrenals. No hydronephrosis or ureteral stone. Multiple renal calculi, more numerous on the right where the largest stone measures 14 mm. Right renal cortical scarring asymmetric to the right. Bulky necrotic tumor at the dome of the bladder with high-density in the bladder lumen likely reflecting both clot and calcification. The confluent mass measures at least 9 cm craniocaudal. Foley catheter is in expected position. The mass is intimately associated with small bowel loops in the pelvis, without bowel obstruction or  demonstrable fistulization. Stomach/Bowel: Descending colostomy. No bowel obstruction or visible inflammation. Vascular/Lymphatic: No acute vascular abnormality. Atherosclerosis of the aorta and iliacs. There are a few pelvic lymph nodes which show prominent enhancement but are strictly nonenlarged. An 8 mm node along the right iliac chain on 2:66 is an example. No para-aortic adenopathy is seen. Reproductive:Unremarkable Other: No ascites or pneumoperitoneum. Musculoskeletal: No acute abnormalities. IMPRESSION: 1. Bulky bladder mass with progression from 10/08/2020. The mass appears necrotic with complex material in the bladder lumen. Foley catheter is normally positioned. 2. The mass is intimately associated with pelvic small bowel loops. Fistulization is possible, especially given the degree of bladder gas, but not clearly demonstrated. Please correlate with Foley catheter output. No small bowel obstruction. 3. Possible early nodal metastatic disease in the pelvis. 4. Right renal atrophy and multiple calculi. Electronically Signed   By: Marnee Spring M.D.   On: 11/03/2020 07:57   DG Chest Port 1 View  Result Date: 11/03/2020 CLINICAL DATA:  Bladder pain and questionable sepsis. EXAM: PORTABLE CHEST 1 VIEW COMPARISON:  October 10, 2020 FINDINGS: Very mild, stable biapical atelectasis and scarring is seen. There is no evidence of acute infiltrate, pleural effusion or pneumothorax. The heart size and mediastinal contours are within normal limits. The visualized skeletal structures are unremarkable. IMPRESSION: Stable exam without acute or active cardiopulmonary disease. Electronically Signed   By: Aram Candela M.D.   On: 11/03/2020 00:12    I independently reviewed the above imaging studies.  Impression/Recommendation 1. 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  S/p TURBT large with Dr. Annabell Howells  on  10/06/2020.  At the time, he was found to have a very large necrotic bladder tumor at the dome, anterior and posterior walls of the bladder.  CT A/P 11/03/2020 with progression of bladder mass associated with areas of necrosis and intraluminal air.  Potentially metastatic pelvic lymph nodes which are enhancing measuring up to 8 mm. 2.  UTI due to necrotic, infected large bladder mass 3.  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  However, this may be related to necrotic tumor with underlying infection.  No identification of colovesicular fistula during TURBT on 10/06/2020 with Dr. Jeffie Pollock. 4.  Bilateral nephrolithiasis: CT A/P 11/03/2020 with bilateral nonobstructing stones.  No evidence of ureteral stone.  Of note, prior CT A/P 10/02/2020 revealed 3 mm proximal right ureteral stone which is now no longer present.  -I upsized his Foley catheter to a 34 [place] and irrigated his bladder with 2 L of normal saline, evacuating purulent urine and debris from his bladder. -Manually irrigate his bladder as needed for decreased drainage, obstructive debris or clots. -No sign of gross hematuria presently.  Suspect that initial hemoglobin was aberrant as repeat was normal at 10.2. -Continue broad-spectrum antibiotics. -Will discuss with Dr. Jeffie Pollock regarding further plans. He has known MIBC, but may benefit from repeat TURBT at some point to debulk infected necrotic bladder mass prior to neoadjuvant chemo and radical cystectomy.  Matt R. Ming Mcmannis MD 11/03/2020, 5:32 PM  [facility]  Pager: [phone]   CC: Cherylann Ratel, DO

## 2020-11-04 ENCOUNTER — Encounter (HOSPITAL_COMMUNITY): Payer: Self-pay | Admitting: Internal Medicine

## 2020-11-04 DIAGNOSIS — N3001 Acute cystitis with hematuria: Secondary | ICD-10-CM

## 2020-11-04 DIAGNOSIS — C679 Malignant neoplasm of bladder, unspecified: Secondary | ICD-10-CM | POA: Insufficient documentation

## 2020-11-04 DIAGNOSIS — A419 Sepsis, unspecified organism: Secondary | ICD-10-CM | POA: Diagnosis not present

## 2020-11-04 LAB — COMPREHENSIVE METABOLIC PANEL
ALT: 10 U/L (ref 0–44)
AST: 14 U/L — ABNORMAL LOW (ref 15–41)
Albumin: 2 g/dL — ABNORMAL LOW (ref 3.5–5.0)
Alkaline Phosphatase: 70 U/L (ref 38–126)
Anion gap: 6 (ref 5–15)
BUN: 13 mg/dL (ref 8–23)
CO2: 22 mmol/L (ref 22–32)
Calcium: 10.7 mg/dL — ABNORMAL HIGH (ref 8.9–10.3)
Chloride: 108 mmol/L (ref 98–111)
Creatinine, Ser: 0.86 mg/dL (ref 0.61–1.24)
GFR, Estimated: >60 mL/min (ref >60)
Glucose, Bld: 172 mg/dL — ABNORMAL HIGH (ref 70–99)
Potassium: 2.8 mmol/L — ABNORMAL LOW (ref 3.5–5.1)
Sodium: 136 mmol/L (ref 135–145)
Total Bilirubin: 0.3 mg/dL (ref 0.3–1.2)
Total Protein: 5.6 g/dL — ABNORMAL LOW (ref 6.5–8.1)

## 2020-11-04 LAB — PROTIME-INR
INR: 1.1 (ref 0.8–1.2)
Prothrombin Time: 13.7 seconds (ref 11.4–15.2)

## 2020-11-04 LAB — GLUCOSE, CAPILLARY: Glucose-Capillary: 117 mg/dL — ABNORMAL HIGH (ref 70–99)

## 2020-11-04 LAB — CBC WITH DIFFERENTIAL/PLATELET
Abs Immature Granulocytes: 0.09 10*3/uL — ABNORMAL HIGH (ref 0.00–0.07)
Basophils Absolute: 0.1 10*3/uL (ref 0.0–0.1)
Basophils Relative: 0 %
Eosinophils Absolute: 1.9 10*3/uL — ABNORMAL HIGH (ref 0.0–0.5)
Eosinophils Relative: 12 %
HCT: 32.6 % — ABNORMAL LOW (ref 39.0–52.0)
Hemoglobin: 10.6 g/dL — ABNORMAL LOW (ref 13.0–17.0)
Immature Granulocytes: 1 %
Lymphocytes Relative: 8 %
Lymphs Abs: 1.3 10*3/uL (ref 0.7–4.0)
MCH: 28.5 pg (ref 26.0–34.0)
MCHC: 32.5 g/dL (ref 30.0–36.0)
MCV: 87.6 fL (ref 80.0–100.0)
Monocytes Absolute: 0.7 10*3/uL (ref 0.1–1.0)
Monocytes Relative: 5 %
Neutro Abs: 12.3 10*3/uL — ABNORMAL HIGH (ref 1.7–7.7)
Neutrophils Relative %: 74 %
Platelets: 321 10*3/uL (ref 150–400)
RBC: 3.72 MIL/uL — ABNORMAL LOW (ref 4.22–5.81)
RDW: 14.8 % (ref 11.5–15.5)
WBC: 16.4 10*3/uL — ABNORMAL HIGH (ref 4.0–10.5)
nRBC: 0 % (ref 0.0–0.2)

## 2020-11-04 LAB — TYPE AND SCREEN
ABO/RH(D): O POS
Antibody Screen: NEGATIVE
Unit division: 0
Unit division: 0

## 2020-11-04 LAB — URINE CULTURE

## 2020-11-04 LAB — BPAM RBC
Blood Product Expiration Date: 202202062359
Blood Product Expiration Date: 202202062359
ISSUE DATE / TIME: 202201061407
ISSUE DATE / TIME: 202201061912
Unit Type and Rh: 5100
Unit Type and Rh: 5100

## 2020-11-04 LAB — CBG MONITORING, ED
Glucose-Capillary: 132 mg/dL — ABNORMAL HIGH (ref 70–99)
Glucose-Capillary: 142 mg/dL — ABNORMAL HIGH (ref 70–99)

## 2020-11-04 LAB — PROCALCITONIN: Procalcitonin: <0.1 ng/mL

## 2020-11-04 LAB — CORTISOL-AM, BLOOD: Cortisol - AM: 13.2 ug/dL (ref 6.7–22.6)

## 2020-11-04 MED ORDER — OXYCODONE HCL 5 MG PO TABS
5.0000 mg | ORAL_TABLET | Freq: Four times a day (QID) | ORAL | Status: DC | PRN
  Administered 2020-11-04 – 2020-11-05 (×3): 10 mg via ORAL
  Filled 2020-11-04 (×3): qty 2

## 2020-11-04 MED ORDER — POTASSIUM CHLORIDE CRYS ER 20 MEQ PO TBCR
40.0000 meq | EXTENDED_RELEASE_TABLET | ORAL | Status: AC
  Administered 2020-11-04 (×2): 40 meq via ORAL
  Filled 2020-11-04 (×2): qty 2

## 2020-11-04 NOTE — Progress Notes (Signed)
Subjective: Pain controlled. No nausea or emesis. Afebrile. Tolerating foley. Still with purulent foul urine draining.  Objective: Vital signs in last 24 hours: Temp:  [97.5 F (36.4 C)-98.3 F (36.8 C)] 98.3 F (36.8 C) (01/06 1927) Pulse Rate:  [58-103] 94 (01/07 0330) Resp:  [10-28] 17 (01/07 0330) BP: (86-130)/(51-85) 115/53 (01/07 0330) SpO2:  [94 %-100 %] 95 % (01/07 0330) Weight:  [50.7 kg] 50.7 kg (01/06 0852)  Intake/Output from previous day: 01/06 0701 - 01/07 0700 In: 2079.9 [I.V.:350; Blood:630; IV Piggyback:1099.9] Out: -  Intake/Output this shift: Total I/O In: 729.9 [Blood:630; IV Piggyback:99.9] Out: -    UOP: not recorded in ED, purulent urine with debris  Physical Exam:  General: Alert and oriented CV: RRR Lungs: Clear Abdomen: Soft, ND, NT; ostomy in place with gas and stool in bag Ext: NT, No erythema  Lab Results: Recent Labs    11/03/20 0230 11/03/20 0337  HGB 5.2* 10.9*  HCT 18.3* 32.0*   BMET Recent Labs    11/03/20 0230 11/03/20 0337  NA 133* 136  K 4.2 4.2  CL 107 106  CO2 12*  --   GLUCOSE 59* 90  BUN 15 25*  CREATININE 0.50* 0.80  CALCIUM 9.0  --      Studies/Results: CT ABDOMEN PELVIS W CONTRAST  Result Date: 11/03/2020 CLINICAL DATA:  Abdominal pain.  Bladder cancer. EXAM: CT ABDOMEN AND PELVIS WITH CONTRAST TECHNIQUE: Multidetector CT imaging of the abdomen and pelvis was performed using the standard protocol following bolus administration of intravenous contrast. CONTRAST:  61mL OMNIPAQUE IOHEXOL 300 MG/ML  SOLN COMPARISON:  10/08/2020 FINDINGS: Lower chest:  No acute finding Hepatobiliary: Multiple hepatic cysts measuring up to 5.4 cm in the right upper lobe.No evidence of biliary obstruction or stone. Pancreas: Unremarkable. Spleen: Unremarkable. Adrenals/Urinary Tract: Negative adrenals. No hydronephrosis or ureteral stone. Multiple renal calculi, more numerous on the right where the largest stone measures 14 mm. Right  renal cortical scarring asymmetric to the right. Bulky necrotic tumor at the dome of the bladder with high-density in the bladder lumen likely reflecting both clot and calcification. The confluent mass measures at least 9 cm craniocaudal. Foley catheter is in expected position. The mass is intimately associated with small bowel loops in the pelvis, without bowel obstruction or demonstrable fistulization. Stomach/Bowel: Descending colostomy. No bowel obstruction or visible inflammation. Vascular/Lymphatic: No acute vascular abnormality. Atherosclerosis of the aorta and iliacs. There are a few pelvic lymph nodes which show prominent enhancement but are strictly nonenlarged. An 8 mm node along the right iliac chain on 2:66 is an example. No para-aortic adenopathy is seen. Reproductive:Unremarkable Other: No ascites or pneumoperitoneum. Musculoskeletal: No acute abnormalities. IMPRESSION: 1. Bulky bladder mass with progression from 10/08/2020. The mass appears necrotic with complex material in the bladder lumen. Foley catheter is normally positioned. 2. The mass is intimately associated with pelvic small bowel loops. Fistulization is possible, especially given the degree of bladder gas, but not clearly demonstrated. Please correlate with Foley catheter output. No small bowel obstruction. 3. Possible early nodal metastatic disease in the pelvis. 4. Right renal atrophy and multiple calculi. Electronically Signed   By: Monte Fantasia M.D.   On: 11/03/2020 07:57   DG Chest Port 1 View  Result Date: 11/03/2020 CLINICAL DATA:  Bladder pain and questionable sepsis. EXAM: PORTABLE CHEST 1 VIEW COMPARISON:  October 10, 2020 FINDINGS: Very mild, stable biapical atelectasis and scarring is seen. There is no evidence of acute infiltrate, pleural effusion or pneumothorax. The heart  size and mediastinal contours are within normal limits. The visualized skeletal structures are unremarkable. IMPRESSION: Stable exam without acute  or active cardiopulmonary disease. Electronically Signed   By: Virgina Norfolk M.D.   On: 11/03/2020 00:12    Assessment/Plan: 1. 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  S/p TURBT large with Dr. Jeffie Pollock on 10/06/2020.  At the time, he was found to have a very large necrotic bladder tumor at the dome, anterior and posterior walls of the bladder.  CT A/P 11/03/2020 with progression of bladder mass associated with areas of necrosis and intraluminal air.  Potentially metastatic pelvic lymph nodes which are enhancing measuring up to 8 mm. 2.  UTI due to necrotic, infected large bladder mass 3.  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  However, this may be related to necrotic tumor with underlying infection.  No identification of colovesicular fistula during TURBT on 10/06/2020 with Dr. Jeffie Pollock. 4.  Bilateral nephrolithiasis: CT A/P 11/03/2020 with bilateral nonobstructing stones.  No evidence of ureteral stone.  Of note, prior CT A/P 10/02/2020 revealed 3 mm proximal right ureteral stone which is now no longer present.  -Foley has been upsized to 20Fr to allow for drainage of pyocystis, purulent urine and debris. Irrigated bladder with 2L NS with return of more purulent urine and debris.  -Manually irrigate his bladder as needed for decreased drainage, obstructive debris or clots. -No sign of gross hematuria presently. Hgb on 1/6 likely aberrant as repeat was 10. -Continue broad-spectrum antibiotics. -Will discuss with Dr. Jeffie Pollock today regarding further plans. He has known MIBC, but may benefit from repeat TURBT at some point to debulk infected necrotic bladder mass prior to neoadjuvant chemo and radical cystectomy.   LOS: 1 day   Matt R. Makyle Eslick MD 11/04/2020, 4:05 AM [facility]  Pager: [phone]

## 2020-11-04 NOTE — ED Notes (Signed)
Provided pt with sandwich and drink 

## 2020-11-04 NOTE — ED Notes (Signed)
Changed pt's ostomy bag and provided ostomy care.

## 2020-11-04 NOTE — Consult Note (Addendum)
[facility]  [phone] [phone]) [phone]   MEDICAL ONCOLOGY - INITIAL CONSULTATION  Referral MD: Dr. Cordelia Poche  Reason for Referral: Infiltrating high-grade urothelial carcinoma  HPI: Jamie Wilson is a 65 year old male with a past medical history significant for bladder cancer with chronic Foley catheter placement and bowel obstruction status post partial bowel resection with colostomy.  The patient was recently hospitalized 10/02/2020 through 10/13/2020 for acute urinary retention.  CT scan showed bladder mass.  He underwent transurethral resection of the bladder tumor and was found to have necrotic bladder tumor on 10/06/2020.  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  The carcinoma invades the muscularis propria.  He was referred to medical oncology on an outpatient basis for consideration of neoadjuvant chemotherapy.  The patient was initially scheduled to see [place] on 10/13/2020 but did not come to his appointment.  He was scheduled for a virtual visit with [place] on 11/01/2020.  He was having significant abdominal discomfort and reported increased white mucus from his catheter.  Findings were concerning for infection and he was referred to the emergency room for further evaluation.  The patient has been having pain in his abdomen for several months but worsened over the past week.  Oral pain medications at home have not been helpful.  He has increased fatigue.  CBC on admission showed a WBC of 10.6, hemoglobin 5.2.  2 units PRBCs were ordered but I only see that one was definitively infused.  Hemoglobin up to 10.6 today.  Albumin was extremely low at 1.2.  Lactic acid greater than 11.  CT abdomen/pelvis showed bulky bladder mass with progression from 10/08/2020.  The mass appears necrotic with clot complex material in the bladder lumen.  Mass is intimately associated with pelvic small bowel  loops-fistulization possible.  He was also noted to have possible early nodal metastatic disease in the pelvis.  The patient has been seen by urology who may consider repeat TURBT at some point.  Would also like the patient evaluated for possible neoadjuvant chemotherapy.  The patient was seen in the emergency room this morning.  He reports that he is feeling somewhat better.  Abdominal pain improved.  He reports significant pain in his toes from gout.  He is not sure if he takes medication for gout or not.  He is asking for more pain medication from me.  The patient denies fevers, chills, headaches, dizziness.  Denies chest pain, shortness of breath.  Denies nausea, vomiting.  No constipation or diarrhea reported.  Denies bleeding such as hematuria.  This is a lot of cloudiness in his Foley catheter.  The patient expressed to me that he is just overall deconditioned and weak.  States that he does not think he would tolerate chemotherapy that would make him feel worse.  Medical oncology was asked see the patient make recommendations regarding his recently diagnosed bladder cancer.   Past Medical History:  Diagnosis Date  . Bladder cancer ([place])   . Colostomy care ([place])   . Toe infection   :  Past Surgical History:  Procedure Laterality Date  . ABDOMINAL SURGERY    . CYSTOSCOPY/URETEROSCOPY/HOLMIUM LASER/STENT PLACEMENT Right 10/06/2020   Procedure: CYSTO;  Surgeon: Irine Seal, MD;  Location: [facility];  Service: Urology;  Laterality: Right;  . TRANSURETHRAL RESECTION OF BLADDER TUMOR N/A 10/06/2020   Procedure: POSSIBLE TRANSURETHRAL RESECTION OF BLADDER TUMOR (TURBT);  Surgeon: Irine Seal, MD;  Location: [facility];  Service: Urology;  Laterality: N/A;  :  Current Facility-Administered Medications  Medication Dose Route Frequency Provider Last Rate Last Admin  . 0.9 %  sodium chloride infusion  10 mL/hr Intravenous Once Cardama, Grayce Sessions, MD      . 0.9 %  sodium chloride infusion  1,000 mL  Intravenous Continuous Marylyn Ishihara, Tyrone A, DO   Stopped at 11/03/20 1835  . acetaminophen (TYLENOL) tablet 650 mg  650 mg Oral Q6H PRN Marylyn Ishihara, Tyrone A, DO   650 mg at 11/04/20 0544   Or  . acetaminophen (TYLENOL) suppository 650 mg  650 mg Rectal Q6H PRN Cherylann Ratel A, DO      . B-complex with vitamin C tablet 1 tablet  1 tablet Oral Daily Kyle, Tyrone A, DO   1 tablet at 11/04/20 0846  . dextrose 10 % and 0.45 % NaCl infusion   Intravenous Continuous Cherylann Ratel A, DO 75 mL/hr at 11/03/20 1837 New Bag at 11/03/20 1837  . folic acid (FOLVITE) tablet 1 mg  1 mg Oral Daily Kyle, Tyrone A, DO   1 mg at 11/04/20 0845  . ondansetron (ZOFRAN) tablet 4 mg  4 mg Oral Q6H PRN Marylyn Ishihara, Tyrone A, DO       Or  . ondansetron (ZOFRAN) injection 4 mg  4 mg Intravenous Q6H PRN Marylyn Ishihara, Tyrone A, DO      . oxyCODONE (Oxy IR/ROXICODONE) immediate release tablet 5-10 mg  5-10 mg Oral Q6H PRN Mariel Aloe, MD      . piperacillin-tazobactam (ZOSYN) IVPB 3.375 g  3.375 g Intravenous Q8H Eudelia Bunch, RPH   Stopped at 11/04/20 1229  . potassium chloride SA (KLOR-CON) CR tablet 40 mEq  40 mEq Oral Q4H Mariel Aloe, MD   40 mEq at 11/04/20 1228  . sodium chloride flush (NS) 0.9 % injection 10-40 mL  10-40 mL Intracatheter Q12H Tegeler, Gwenyth Allegra, MD   10 mL at 11/04/20 0846  . sodium chloride flush (NS) 0.9 % injection 10-40 mL  10-40 mL Intracatheter PRN Tegeler, Gwenyth Allegra, MD      . vancomycin (VANCOREADY) IVPB 500 mg/100 mL  500 mg Intravenous Q12H Leodis Sias T, RPH 100 mL/hr at 11/04/20 0958 500 mg at 11/04/20 0958  . vitamin B-12 (CYANOCOBALAMIN) tablet 1,000 mcg  1,000 mcg Oral Daily Kyle, Tyrone A, DO   1,000 mcg at 11/04/20 [id number]   Current Outpatient Medications  Medication Sig Dispense Refill  . acetaminophen (TYLENOL) 500 MG tablet Take 2 tablets (1,000 mg total) by mouth every 8 (eight) hours. (Patient taking differently: Take 1,000 mg by mouth every 8 (eight) hours as needed for mild pain,  moderate pain, fever or headache.) 30 tablet 0  . B Complex-C (B-COMPLEX WITH VITAMIN C) tablet Take 1 tablet by mouth daily. [id number] tablet 0  . folic acid (FOLVITE) 1 MG tablet Take 1 tablet (1 mg total) by mouth daily. 100 tablet 0  . tamsulosin (FLOMAX) 0.4 MG CAPS capsule Take 1 capsule (0.4 mg total) by mouth daily. 30 capsule 2  . vitamin B-12 1000 MCG tablet Take 1 tablet (1,000 mcg total) by mouth daily. 100 tablet 0  . bisacodyl (DULCOLAX) 5 MG EC tablet Take 1 tablet (5 mg total) by mouth daily as needed for moderate constipation. (Patient not taking: Reported on 11/03/2020) 30 tablet 0  . oxyCODONE (OXY IR/ROXICODONE) 5 MG immediate release tablet Take 1-2 tablets (5-10 mg total) by mouth every 6 (six) hours as needed for moderate pain or severe pain (  5 moderate, 10 severe). (Patient not taking: Reported on 11/03/2020) 20 tablet 0  . polyethylene glycol (MIRALAX / GLYCOLAX) 17 g packet Take 17 g by mouth 2 (two) times daily. (Patient not taking: No sig reported) 14 each 0  . senna-docusate (SENOKOT-S) 8.6-50 MG tablet Take 1 tablet by mouth 2 (two) times daily. (Patient not taking: Reported on 11/03/2020) 60 tablet 2     Allergies  Allergen Reactions  . Silvadene [Silver Sulfadiazine] Rash  :  Family History  Problem Relation Age of Onset  . CAD Neg Hx   . Heart failure Neg Hx   :  Social History   Socioeconomic History  . Marital status: Divorced    Spouse name: Not on file  . Number of children: Not on file  . Years of education: Not on file  . Highest education level: Not on file  Occupational History  . Not on file  Tobacco Use  . Smoking status: Current Some Day Smoker  . Smokeless tobacco: Never Used  Substance and Sexual Activity  . Alcohol use: Yes  . Drug use: Yes    Types: Marijuana  . Sexual activity: Not on file  Other Topics Concern  . Not on file  Social History Narrative  . Not on file   Social Determinants of Health   Financial Resource Strain: Not on  file  Food Insecurity: Not on file  Transportation Needs: Not on file  Physical Activity: Not on file  Stress: Not on file  Social Connections: Not on file  Intimate Partner Violence: Not on file  :  Review of Systems: A comprehensive 14 point review of systems was negative except as noted in the HPI.  Exam: Patient Vitals for the past 24 hrs:  BP Temp Temp src Pulse Resp SpO2  11/04/20 1230 - - - 82 17 98 %  11/04/20 1215 - - - 73 16 98 %  11/04/20 1200 126/61 - - 72 16 98 %  11/04/20 1145 - - - 69 (!) 24 98 %  11/04/20 1130 125/64 - - 76 15 97 %  11/04/20 1115 - - - 71 16 97 %  11/04/20 1100 (!) 116/56 - - 70 15 97 %  11/04/20 1045 - - - 70 17 96 %  11/04/20 1030 (!) 115/54 - - 74 19 96 %  11/04/20 1015 - - - 75 16 97 %  11/04/20 1000 (!) 120/57 - - 78 18 97 %  11/04/20 0945 - - - 79 17 96 %  11/04/20 0930 115/71 - - 81 18 97 %  11/04/20 0915 - - - 87 20 96 %  11/04/20 0900 (!) 138/100 - - 92 20 97 %  11/04/20 0845 - - - 84 (!) 22 96 %  11/04/20 0830 120/71 - - (!) 111 17 96 %  11/04/20 0815 - - - 85 16 96 %  11/04/20 0800 108/66 - - 84 16 96 %  11/04/20 0745 - - - 92 20 95 %  11/04/20 0730 (!) 151/97 - - 96 18 96 %  11/04/20 0715 - - - 83 16 96 %  11/04/20 0700 (!) 113/57 - - 86 15 95 %  11/04/20 0645 - - - 82 15 95 %  11/04/20 0630 125/62 - - 82 16 96 %  11/04/20 0615 - - - (!) 107 18 97 %  11/04/20 0600 121/83 - - 91 16 97 %  11/04/20 0545 - - - 99 15 97 %  11/04/20 0530 116/60 - - 92 20 96 %  11/04/20 0330 (!) 115/53 - - 94 17 95 %  11/04/20 0300 (!) 106/56 - - 96 17 94 %  11/04/20 0100 (!) 114/58 - - (!) 102 19 94 %  11/04/20 0000 (!) 116/57 - - 95 17 94 %  11/03/20 2230 (!) 129/57 - - 95 16 95 %  11/03/20 2200 (!) 127/56 - - 92 18 95 %  11/03/20 2130 (!) 120/54 - - 90 16 96 %  11/03/20 2100 (!) 114/56 - - 94 17 96 %  11/03/20 1927 (!) 111/55 98.3 F (36.8 C) Oral 94 16 96 %  11/03/20 1900 114/78 - - 100 20 96 %  11/03/20 1845 - - - (!) 102 20 97 %   11/03/20 1830 (!) 120/58 - - 91 16 96 %  11/03/20 1815 - - - (!) 103 (!) 24 96 %  11/03/20 1800 114/75 - - (!) 103 18 97 %  11/03/20 1745 - - - 100 18 99 %  11/03/20 1730 (!) 119/57 - - 98 17 97 %  11/03/20 1715 - - - 98 18 98 %  11/03/20 1700 120/61 - - 80 15 98 %  11/03/20 1645 - - - 82 14 99 %  11/03/20 1630 (!) 101/53 - - 82 15 100 %  11/03/20 1615 - - - 83 14 100 %  11/03/20 1600 (!) 104/55 - - 69 14 100 %  11/03/20 1545 - - - 75 15 100 %  11/03/20 1530 (!) 98/51 - - 77 14 99 %  11/03/20 1515 - - - 95 13 100 %  11/03/20 1500 103/82 - - 93 19 97 %  11/03/20 1445 130/66 - - 79 14 100 %  11/03/20 1444 130/66 (!) 97.5 F (36.4 C) Oral 82 14 99 %  11/03/20 1430 116/64 - - 82 17 98 %  11/03/20 1421 126/69 (!) 97.5 F (36.4 C) Oral 87 16 -  11/03/20 1415 - - - 82 18 98 %  11/03/20 1400 126/66 - - 84 17 99 %  11/03/20 1345 - - - 76 11 97 %  11/03/20 1330 130/68 - - 91 19 100 %    General: Chronically ill-appearing male, no distress, cachectic.   Eyes:  no scleral icterus.   ENT:  There were no oropharyngeal lesions.    Lymphatics:  Negative cervical, supraclavicular or axillary adenopathy.   Respiratory: lungs were clear bilaterally without wheezing or crackles.   Cardiovascular:  Regular rate and rhythm, S1/S2, without murmur, rub or gallop.  There was no pedal edema.   GI:  abdomen was soft, flat, nontender, nondistended, without organomegaly.  Colostomy in the left lower quadrant. Skin exam was without echymosis, petichae.   Neuro exam was nonfocal. Patient was alert and oriented.  Attention was good.   Language was appropriate.  Mood was normal without depression.  Speech was not pressured.  Thought content was not tangential.     Lab Results  Component Value Date   WBC 16.4 (H) 11/04/2020   HGB 10.6 (L) 11/04/2020   HCT 32.6 (L) 11/04/2020   PLT 321 11/04/2020   GLUCOSE 172 (H) 11/04/2020   CHOL 105 08/18/2020   TRIG 74 08/18/2020   HDL 39 (L) 08/18/2020   LDLCALC  51 08/18/2020   ALT 10 11/04/2020   AST 14 (L) 11/04/2020   NA 136 11/04/2020   K 2.8 (L) 11/04/2020   CL 108 11/04/2020   CREATININE  0.86 11/04/2020   BUN 13 11/04/2020   CO2 22 11/04/2020    CT ABDOMEN PELVIS W WO CONTRAST  Result Date: 10/08/2020 CLINICAL DATA:  Abdominal pain, bladder masses. Potential bladder enteric fistula EXAM: CT ABDOMEN AND PELVIS WITHOUT AND WITH CONTRAST TECHNIQUE: Multidetector CT imaging of the abdomen and pelvis was performed following the standard protocol before and following the bolus administration of intravenous contrast. CONTRAST:  134mL OMNIPAQUE IOHEXOL 300 MG/ML  SOLN COMPARISON:  None. FINDINGS: Lower chest: Bilateral small effusions.  Mild passive atelectasis. Hepatobiliary: Multiple benign hepatic cysts. Cysts are large measuring up to 5 cm. Gallbladder collapsed. Pancreas: Pancreas is normal. No ductal dilatation. No pancreatic inflammation. Spleen: Normal spleen Adrenals/urinary tract: Coarse RIGHT renal calculi. Small LEFT renal calculus. Ureteral calculi. No bladder calculi. No enhancing renal cortical lesion. Cortical scarring of the RIGHT kidney. Benign cyst of the LEFT kidney. Irregular thickening of the bladder wall with enhancement. Gas and nonenhancing material centrally within the bladder. Foley catheter with retention bulb in the bladder. Stomach/Bowel: Stomach, small bowel cecum normal. Appendix not identified. LEFT abdominal wall colostomy. Partial LEFT colon colectomy. Stump at the sigmoid colon. Rectum appears normal. Vascular/Lymphatic: Abdominal aorta is normal caliber with atherosclerotic calcification. There is no retroperitoneal or periportal lymphadenopathy. No pelvic lymphadenopathy. Reproductive: Prostate unremarkable Other: No free fluid or free air Musculoskeletal: No aggressive osseous lesion IMPRESSION: 1. Irregular thickening of the bladder wall with enhancement. Gas and nonenhancing material centrally within the bladder. Bladder  neoplasm is certainly a consideration. 2. Bilateral nephrolithiasis. No ureterolithiasis or obstructive uropathy. 3. 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 4. Aortic Atherosclerosis (ICD10-I70.0). Electronically Signed   By: Suzy Bouchard M.D.   On: 10/08/2020 04:19   DG Chest 2 View  Result Date: 10/10/2020 CLINICAL DATA:  Bladder cancer. EXAM: CHEST - 2 VIEW COMPARISON:  CT abdomen/pelvis 10/08/2020. Prior chest radiograph 08/16/2020. FINDINGS: Heart size within normal limits. Aortic atherosclerosis. No appreciable airspace consolidation or pulmonary nodules. Biapical pleuroparenchymal scarring. Possible trace right pleural effusion. No evidence of pneumothorax. No acute bony abnormality. IMPRESSION: No appreciable airspace consolidation or pulmonary nodules. Possible trace right pleural effusion. Aortic Atherosclerosis (ICD10-I70.0). Electronically Signed   By: Kellie Simmering DO   On: 10/10/2020 09:32   CT CHEST W CONTRAST  Result Date: 10/11/2020 CLINICAL DATA:  Bladder cancer.  Staging chest CT. EXAM: CT CHEST WITH CONTRAST TECHNIQUE: Multidetector CT imaging of the chest was performed during intravenous contrast administration. CONTRAST:  56mL OMNIPAQUE IOHEXOL 300 MG/ML  SOLN COMPARISON:  None. FINDINGS: Cardiovascular: The heart is normal in size. Small amount of pericardial fluid but no overt effusion.The aorta is normal in caliber. No dissection. Stable atherosclerotic calcifications. Stable branch vessel calcifications. The pulmonary arteries are unremarkable. Mediastinum/Nodes: Borderline enlarged mediastinal and hilar lymph nodes. Left hilar node on image 65/2 measures 11 mm. Right hilar node on image number 68/2 measures 9.5 mm. Right-sided subcarinal lymph node on image number 92/2 measures 10 mm. Right infrahilar node on image 85/2 measures 7 mm. The esophagus is grossly normal.  Thyroid gland is unremarkable. Lungs/Pleura: Emphysematous changes are noted along with pulmonary  scarring. No infiltrates or effusions. Streaky bibasilar atelectasis. No worrisome pulmonary lesions and no worrisome pulmonary nodules to suggest metastatic disease. Mild tracheomegaly. There is some debris in the trachea. This is likely mucous. I do not see an obvious mass. Upper Abdomen: Stable hepatic cysts and left renal cyst. Musculoskeletal: No chest wall mass, supraclavicular or axillary adenopathy. The bony thorax is intact.  No worrisome  bone lesions. IMPRESSION: 1. Borderline enlarged mediastinal and hilar lymph nodes. These are likely related to the patient has emphysema but attention on future studies or PET-CT may be helpful for further evaluation. 2. No findings for pulmonary metastatic disease. 3. Emphysematous changes and pulmonary scarring. 4. Debris in the trachea, likely mucous. 5. Stable hepatic and left renal cysts. 6. Emphysema and aortic atherosclerosis. Aortic Atherosclerosis (ICD10-I70.0) and Emphysema (ICD10-J43.9). Electronically Signed   By: Marijo Sanes M.D.   On: 10/11/2020 19:39   CT ABDOMEN PELVIS W CONTRAST  Result Date: 11/03/2020 CLINICAL DATA:  Abdominal pain.  Bladder cancer. EXAM: CT ABDOMEN AND PELVIS WITH CONTRAST TECHNIQUE: Multidetector CT imaging of the abdomen and pelvis was performed using the standard protocol following bolus administration of intravenous contrast. CONTRAST:  75mL OMNIPAQUE IOHEXOL 300 MG/ML  SOLN COMPARISON:  10/08/2020 FINDINGS: Lower chest:  No acute finding Hepatobiliary: Multiple hepatic cysts measuring up to 5.4 cm in the right upper lobe.No evidence of biliary obstruction or stone. Pancreas: Unremarkable. Spleen: Unremarkable. Adrenals/Urinary Tract: Negative adrenals. No hydronephrosis or ureteral stone. Multiple renal calculi, more numerous on the right where the largest stone measures 14 mm. Right renal cortical scarring asymmetric to the right. Bulky necrotic tumor at the dome of the bladder with high-density in the bladder lumen likely  reflecting both clot and calcification. The confluent mass measures at least 9 cm craniocaudal. Foley catheter is in expected position. The mass is intimately associated with small bowel loops in the pelvis, without bowel obstruction or demonstrable fistulization. Stomach/Bowel: Descending colostomy. No bowel obstruction or visible inflammation. Vascular/Lymphatic: No acute vascular abnormality. Atherosclerosis of the aorta and iliacs. There are a few pelvic lymph nodes which show prominent enhancement but are strictly nonenlarged. An 8 mm node along the right iliac chain on 2:66 is an example. No para-aortic adenopathy is seen. Reproductive:Unremarkable Other: No ascites or pneumoperitoneum. Musculoskeletal: No acute abnormalities. IMPRESSION: 1. Bulky bladder mass with progression from 10/08/2020. The mass appears necrotic with complex material in the bladder lumen. Foley catheter is normally positioned. 2. The mass is intimately associated with pelvic small bowel loops. Fistulization is possible, especially given the degree of bladder gas, but not clearly demonstrated. Please correlate with Foley catheter output. No small bowel obstruction. 3. Possible early nodal metastatic disease in the pelvis. 4. Right renal atrophy and multiple calculi. Electronically Signed   By: Monte Fantasia M.D.   On: 11/03/2020 07:57   DG Chest Port 1 View  Result Date: 11/03/2020 CLINICAL DATA:  Bladder pain and questionable sepsis. EXAM: PORTABLE CHEST 1 VIEW COMPARISON:  October 10, 2020 FINDINGS: Very mild, stable biapical atelectasis and scarring is seen. There is no evidence of acute infiltrate, pleural effusion or pneumothorax. The heart size and mediastinal contours are within normal limits. The visualized skeletal structures are unremarkable. IMPRESSION: Stable exam without acute or active cardiopulmonary disease. Electronically Signed   By: Virgina Norfolk M.D.   On: 11/03/2020 00:12   DG Abd Portable 1V  Result  Date: 10/11/2020 CLINICAL DATA:  Abdominal pain EXAM: PORTABLE ABDOMEN - 1 VIEW COMPARISON:  10/09/2020 FINDINGS: Scattered large and small bowel gas is noted. Ostomy is noted on the left similar to that seen on prior exam. 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 No obstructive changes are seen. Persistent fecal material is noted within the colon slightly increased when compared with the prior exam. No obstructive changes are seen. IMPRESSION: Slight increase in fecal material within the colon. No definitive obstructive changes seen.  Electronically Signed   By: Inez Catalina M.D.   On: 10/11/2020 14:38   DG Abd Portable 1V  Result Date: 10/09/2020 CLINICAL DATA:  Abdominal pain. EXAM: PORTABLE ABDOMEN - 1 VIEW COMPARISON:  October 03, 2020. FINDINGS: No abnormal bowel dilatation is noted. Large amount of stool is seen throughout the colon. Right nephrolithiasis is noted. IMPRESSION: Large stool burden is noted. No evidence of bowel obstruction or ileus. Electronically Signed   By: Marijo Conception M.D.   On: 10/09/2020 12:10     CT ABDOMEN PELVIS W WO CONTRAST  Result Date: 10/08/2020 CLINICAL DATA:  Abdominal pain, bladder masses. Potential bladder enteric fistula EXAM: CT ABDOMEN AND PELVIS WITHOUT AND WITH CONTRAST TECHNIQUE: Multidetector CT imaging of the abdomen and pelvis was performed following the standard protocol before and following the bolus administration of intravenous contrast. CONTRAST:  14mL OMNIPAQUE IOHEXOL 300 MG/ML  SOLN COMPARISON:  None. FINDINGS: Lower chest: Bilateral small effusions.  Mild passive atelectasis. Hepatobiliary: Multiple benign hepatic cysts. Cysts are large measuring up to 5 cm. Gallbladder collapsed. Pancreas: Pancreas is normal. No ductal dilatation. No pancreatic inflammation. Spleen: Normal spleen Adrenals/urinary tract: Coarse RIGHT renal calculi. Small LEFT renal calculus. Ureteral calculi. No bladder calculi. No enhancing renal cortical lesion.  Cortical scarring of the RIGHT kidney. Benign cyst of the LEFT kidney. Irregular thickening of the bladder wall with enhancement. Gas and nonenhancing material centrally within the bladder. Foley catheter with retention bulb in the bladder. Stomach/Bowel: Stomach, small bowel cecum normal. Appendix not identified. LEFT abdominal wall colostomy. Partial LEFT colon colectomy. Stump at the sigmoid colon. Rectum appears normal. Vascular/Lymphatic: Abdominal aorta is normal caliber with atherosclerotic calcification. There is no retroperitoneal or periportal lymphadenopathy. No pelvic lymphadenopathy. Reproductive: Prostate unremarkable Other: No free fluid or free air Musculoskeletal: No aggressive osseous lesion IMPRESSION: 1. Irregular thickening of the bladder wall with enhancement. Gas and nonenhancing material centrally within the bladder. Bladder neoplasm is certainly a consideration. 2. Bilateral nephrolithiasis. No ureterolithiasis or obstructive uropathy. 3. 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 4. Aortic Atherosclerosis (ICD10-I70.0). Electronically Signed   By: Suzy Bouchard M.D.   On: 10/08/2020 04:19   DG Chest 2 View  Result Date: 10/10/2020 CLINICAL DATA:  Bladder cancer. EXAM: CHEST - 2 VIEW COMPARISON:  CT abdomen/pelvis 10/08/2020. Prior chest radiograph 08/16/2020. FINDINGS: Heart size within normal limits. Aortic atherosclerosis. No appreciable airspace consolidation or pulmonary nodules. Biapical pleuroparenchymal scarring. Possible trace right pleural effusion. No evidence of pneumothorax. No acute bony abnormality. IMPRESSION: No appreciable airspace consolidation or pulmonary nodules. Possible trace right pleural effusion. Aortic Atherosclerosis (ICD10-I70.0). Electronically Signed   By: Kellie Simmering DO   On: 10/10/2020 09:32   CT CHEST W CONTRAST  Result Date: 10/11/2020 CLINICAL DATA:  Bladder cancer.  Staging chest CT. EXAM: CT CHEST WITH CONTRAST TECHNIQUE:  Multidetector CT imaging of the chest was performed during intravenous contrast administration. CONTRAST:  72mL OMNIPAQUE IOHEXOL 300 MG/ML  SOLN COMPARISON:  None. FINDINGS: Cardiovascular: The heart is normal in size. Small amount of pericardial fluid but no overt effusion.The aorta is normal in caliber. No dissection. Stable atherosclerotic calcifications. Stable branch vessel calcifications. The pulmonary arteries are unremarkable. Mediastinum/Nodes: Borderline enlarged mediastinal and hilar lymph nodes. Left hilar node on image 65/2 measures 11 mm. Right hilar node on image number 68/2 measures 9.5 mm. Right-sided subcarinal lymph node on image number 92/2 measures 10 mm. Right infrahilar node on image 85/2 measures 7 mm. The esophagus is grossly normal.  Thyroid gland  is unremarkable. Lungs/Pleura: Emphysematous changes are noted along with pulmonary scarring. No infiltrates or effusions. Streaky bibasilar atelectasis. No worrisome pulmonary lesions and no worrisome pulmonary nodules to suggest metastatic disease. Mild tracheomegaly. There is some debris in the trachea. This is likely mucous. I do not see an obvious mass. Upper Abdomen: Stable hepatic cysts and left renal cyst. Musculoskeletal: No chest wall mass, supraclavicular or axillary adenopathy. The bony thorax is intact.  No worrisome bone lesions. IMPRESSION: 1. Borderline enlarged mediastinal and hilar lymph nodes. These are likely related to the patient has emphysema but attention on future studies or PET-CT may be helpful for further evaluation. 2. No findings for pulmonary metastatic disease. 3. Emphysematous changes and pulmonary scarring. 4. Debris in the trachea, likely mucous. 5. Stable hepatic and left renal cysts. 6. Emphysema and aortic atherosclerosis. Aortic Atherosclerosis (ICD10-I70.0) and Emphysema (ICD10-J43.9). Electronically Signed   By: Marijo Sanes M.D.   On: 10/11/2020 19:39   CT ABDOMEN PELVIS W CONTRAST  Result Date:  11/03/2020 CLINICAL DATA:  Abdominal pain.  Bladder cancer. EXAM: CT ABDOMEN AND PELVIS WITH CONTRAST TECHNIQUE: Multidetector CT imaging of the abdomen and pelvis was performed using the standard protocol following bolus administration of intravenous contrast. CONTRAST:  19mL OMNIPAQUE IOHEXOL 300 MG/ML  SOLN COMPARISON:  10/08/2020 FINDINGS: Lower chest:  No acute finding Hepatobiliary: Multiple hepatic cysts measuring up to 5.4 cm in the right upper lobe.No evidence of biliary obstruction or stone. Pancreas: Unremarkable. Spleen: Unremarkable. Adrenals/Urinary Tract: Negative adrenals. No hydronephrosis or ureteral stone. Multiple renal calculi, more numerous on the right where the largest stone measures 14 mm. Right renal cortical scarring asymmetric to the right. Bulky necrotic tumor at the dome of the bladder with high-density in the bladder lumen likely reflecting both clot and calcification. The confluent mass measures at least 9 cm craniocaudal. Foley catheter is in expected position. The mass is intimately associated with small bowel loops in the pelvis, without bowel obstruction or demonstrable fistulization. Stomach/Bowel: Descending colostomy. No bowel obstruction or visible inflammation. Vascular/Lymphatic: No acute vascular abnormality. Atherosclerosis of the aorta and iliacs. There are a few pelvic lymph nodes which show prominent enhancement but are strictly nonenlarged. An 8 mm node along the right iliac chain on 2:66 is an example. No para-aortic adenopathy is seen. Reproductive:Unremarkable Other: No ascites or pneumoperitoneum. Musculoskeletal: No acute abnormalities. IMPRESSION: 1. Bulky bladder mass with progression from 10/08/2020. The mass appears necrotic with complex material in the bladder lumen. Foley catheter is normally positioned. 2. The mass is intimately associated with pelvic small bowel loops. Fistulization is possible, especially given the degree of bladder gas, but not clearly  demonstrated. Please correlate with Foley catheter output. No small bowel obstruction. 3. Possible early nodal metastatic disease in the pelvis. 4. Right renal atrophy and multiple calculi. Electronically Signed   By: Monte Fantasia M.D.   On: 11/03/2020 07:57   DG Chest Port 1 View  Result Date: 11/03/2020 CLINICAL DATA:  Bladder pain and questionable sepsis. EXAM: PORTABLE CHEST 1 VIEW COMPARISON:  October 10, 2020 FINDINGS: Very mild, stable biapical atelectasis and scarring is seen. There is no evidence of acute infiltrate, pleural effusion or pneumothorax. The heart size and mediastinal contours are within normal limits. The visualized skeletal structures are unremarkable. IMPRESSION: Stable exam without acute or active cardiopulmonary disease. Electronically Signed   By: Virgina Norfolk M.D.   On: 11/03/2020 00:12   DG Abd Portable 1V  Result Date: 10/11/2020 CLINICAL DATA:  Abdominal pain EXAM: PORTABLE  ABDOMEN - 1 VIEW COMPARISON:  10/09/2020 FINDINGS: Scattered large and small bowel gas is noted. Ostomy is noted on the left similar to that seen on prior exam. 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 No obstructive changes are seen. Persistent fecal material is noted within the colon slightly increased when compared with the prior exam. No obstructive changes are seen. IMPRESSION: Slight increase in fecal material within the colon. No definitive obstructive changes seen. Electronically Signed   By: Inez Catalina M.D.   On: 10/11/2020 14:38   DG Abd Portable 1V  Result Date: 10/09/2020 CLINICAL DATA:  Abdominal pain. EXAM: PORTABLE ABDOMEN - 1 VIEW COMPARISON:  October 03, 2020. FINDINGS: No abnormal bowel dilatation is noted. Large amount of stool is seen throughout the colon. Right nephrolithiasis is noted. IMPRESSION: Large stool burden is noted. No evidence of bowel obstruction or ileus. Electronically Signed   By: Marijo Conception M.D.   On: 10/09/2020 12:10    Pathology:   SURGICAL PATHOLOGY  CASE: [id number]  PATIENT: Jamie Wilson  Surgical Pathology Report   Clinical History: right ureteral stone bladder mass (kp)   FINAL MICROSCOPIC DIAGNOSIS:   A. BLADDER, MASS, TRANSURETHRAL RESECTION:  Infiltrating high grade urothelial carcinoma  With poorly differentiated (50%), Sarcomatoid (40%) and squamous (10%)  components  The carcinoma invades muscularis propria (detrusor muscle)  Urothelial carcinoma in situ is present   B. BLADDER, TUMOR BASE, TRANSURETHRAL RESECTION:  Infiltrating high grade poorly differentiated urothelial carcinoma  The carcinoma invades muscularis propria (detrusor muscle)   Comment: the Sarcomatoid mesenchymal component is composed of  nonspecific malignant myxoid spindle cells.    GROSS DESCRIPTION:   A: Received fresh are fragments of soft tan hemorrhagic tissue measuring  6.0 x 6.0 x 2.5 cm in aggregate. Sections are submitted in 10  cassettes.   B: Received fresh are portions of rubbery tan-gray tissue which measure  3.5 x 3.0 x 0.7 cm in aggregate. The specimen is entirely submitted in  3 cassettes. (GRP 10/07/2020)    Final Diagnosis performed by Casimer Lanius, MD.  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 -10/02/2020 CT renal stone study- "Heterogeneous soft tissue mass extending from the bladder superiorlywhich appears to likely have a fistulous connection to the overlying small bowel and colonic loops, concerning for bladder neoplasm." -Status post TURBT on 10/06/2020 -CT abdomen/pelvis 10/08/2020-"1. Irregular thickening of the bladder wall with enhancement. Gas and nonenhancing material centrally within the bladder. Bladder neoplasm is certainly a consideration." -CT chest with contrast 10/11/2020- "1. Borderline enlarged mediastinal and hilar lymph nodes. These  are likely related to the patient has emphysema but attention on future studies or PET-CT may be helpful for further evaluation. 2. No findings for pulmonary metastatic disease." -CT abdomen/pelvis with contrast 11/03/2020- "1. Bulky bladder mass with progression from 10/08/2020. The mass appears necrotic with complex material in the bladder lumen. Foley catheter is normally positioned. 2. The mass is intimately associated with pelvic small bowel loops. Fistulization is possible, especially given the degree of bladder gas, but not clearly demonstrated. Please correlate with Foley catheter output. No small bowel obstruction. 3. Possible early nodal metastatic disease in the pelvis. 4. Right renal atrophy and multiple calculi."  2) sepsis secondary to UTI and likely infected necrotic bladder mass  3) protein calorie malnutrition  4) normocytic anemia  -The patient is under consideration for neoadjuvant  chemotherapy for treatment of his bladder cancer.  Due to his poor functional status, poor nutritional status, lack of social support, and poor transportation, he may not be a good candidate for neoadjuvant chemotherapy at this time.  The patient is very much in agreement with this and feels that chemotherapy would make him feel worse rather than better. -We plan to obtain a PET scan as an outpatient. -Urology is currently following him.  May consider repeat TURBT at some point in the future.  -Treatment of infection per hospitalist/urology. -Recommend dietitian consult due to protein calorie malnutrition. -Will obtain additional work-up of his anemia with next lab draw including a vitamin B12 level, folate level, ferritin, iron studies.  Recommend PRBC transfusion for hemoglobin less than 7.   Thank you for this referral.   Mikey Bussing, DNP, AGPCNP-BC, AOCNP   ADDENDUM  .Patient was Personally and independently interviewed, examined and relevant elements of the history of present illness were  reviewed in details and an assessment and plan was created. All elements of the patient's history of present illness , assessment and plan were discussed in details with Mikey Bussing, DNP, AGPCNP-BC, AOCNP. The above documentation reflects our combined findings assessment and plan.  Patient is not a good candidate for neoadjuvant Cisplatin based chemotherapy based on his poor functional status, poor nutritional status, lack of social support and issues with recurrent UTI's with sepsis from necrotic primary tumor. Urology following. Options would be to pursue cystectomy and then consider adjuvant chemo+/- immunotherapy based on functional status. If patient is not candidate for cystectomy then would need to consult radiation oncology for consideration of concurrent chemo RT or RT alone. PET/CT plan as outpatient to r/o metastatic disease ?mediastinal LN's  Sullivan Lone MD MS

## 2020-11-04 NOTE — Progress Notes (Signed)
PROGRESS NOTE    Jamie Wilson  TIW:[id number] DOB: 1956-06-15 DOA: 11/02/2020 PCP: Patient, [name]   Brief Narrative: Jamie Wilson is a 65 y.o. 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 Patient presented secondary to abdominal pain and found to have evidence of sepsis possibly secondary to necrotic bladder mass. Empiric antibiotics initiated. Urology and oncology consulted.   Assessment & Plan:   Active Problems:   Malnutrition ([place])   Sepsis ([place])   UTI (urinary tract infection)   Sepsis Present on admission. Possible urinary source. CT abd/pelvis significant for a necrotic bladder mass which may be contributing as well. Started on Vancomycin and Zosyn on admission. Blood (1 set)/urine cultures obtained on admission. Urine culture with multiple species. -Continue Vancomycin/Zosyn -Follow-up blood culture prior to deescalating antibiotics -Obtain new urine culture  Urothelial carcinoma of the bladder Necrotic per description. Urology consulted. Consideration of TURBT for debulking. Foley replaced by urology with purulent drainage noted. -Urology recommendations -Medical oncology recommendations: poor candidate for neoadjuvant chemotherapy with recommendation to consider cystectomy prior to adjuvant chemo/immunotherapy vs radiation oncology for concurrent chemo/radiation therapy  Underweight Concern for malnutrition -Dietitian consult  Possible UTI Urine culture with multiple species -Repeat urine culture  Chronic anemia Severely low hemoglobin appears to be erroneous. Hemoglobin of around 10-11 which is baseline.  Hypoglycemia -Discontinue D10 IV fluids  Coagulopathy Initial INR of 1.6 which has improved to 1.1 and is at baseline   DVT prophylaxis: SCDs Code Status:   Code Status: Full Code Family Communication: None at bedside Disposition Plan: Discharge home vs SNF  likely in several days pending urology/medical oncology recommendations   Consultants:   Urology  Medical oncology  Procedures:   None  Antimicrobials:  Vancomycin  Zosyn    Subjective: No issues overnight.  Objective: Vitals:   11/04/20 1145 11/04/20 1200 11/04/20 1215 11/04/20 1230  BP:  126/61    Pulse: 69 72 73 82  Resp: (!) 24 16 16 17   Temp:      TempSrc:      SpO2: 98% 98% 98% 98%  Weight:      Height:        Intake/Output Summary (Last 24 hours) at 11/04/2020 1527 Last data filed at 11/04/2020 1526 Gross per 24 hour  Intake 1561.11 ml  Output --  Net 1561.11 ml   Filed Weights   11/03/20 0852  Weight: 50.7 kg    Examination:  General exam: Appears calm and comfortable. Chronically ill appearing. Respiratory system: Clear to auscultation. Respiratory effort normal. Cardiovascular system: S1 & S2 heard, RRR. No murmurs, rubs, gallops or clicks. Gastrointestinal system: Abdomen is nondistended, soft and nontender. No organomegaly or masses felt. Normal bowel sounds heard. Ostomy bag with some brown stool Central nervous system: Alert and oriented. No focal neurological deficits. Musculoskeletal: No edema. No calf tenderness Skin: No cyanosis. No rashes Psychiatry: Judgement and insight appear normal. Mood & affect appropriate.     Data Reviewed: I have personally reviewed following labs and imaging studies  CBC Lab Results  Component Value Date   WBC 16.4 (H) 11/04/2020   RBC 3.72 (L) 11/04/2020   HGB 10.6 (L) 11/04/2020   HCT 32.6 (L) 11/04/2020   MCV 87.6 11/04/2020   MCH 28.5 11/04/2020   PLT 321 11/04/2020   MCHC 32.5 11/04/2020   RDW 14.8 11/04/2020   LYMPHSABS 1.3 11/04/2020   MONOABS 0.7 11/04/2020   EOSABS 1.9 (  H) 11/04/2020   BASOSABS 0.1 17/61/6073     Last metabolic panel Lab Results  Component Value Date   NA 136 11/04/2020   K 2.8 (L) 11/04/2020   CL 108 11/04/2020   CO2 22 11/04/2020   BUN 13 11/04/2020    CREATININE 0.86 11/04/2020   GLUCOSE 172 (H) 11/04/2020   GFRNONAA >60 11/04/2020   GFRAA >60 11/21/2019   CALCIUM 10.7 (H) 11/04/2020   PHOS 1.7 (L) 10/05/2020   PROT 5.6 (L) 11/04/2020   ALBUMIN 2.0 (L) 11/04/2020   BILITOT 0.3 11/04/2020   ALKPHOS 70 11/04/2020   AST 14 (L) 11/04/2020   ALT 10 11/04/2020   ANIONGAP 6 11/04/2020    CBG (last 3)  Recent Labs    11/03/20 0632 11/04/20 0122 11/04/20 1245  GLUCAP 124* 132* 142*     GFR: Estimated Creatinine Clearance: 61.4 mL/min (by C-G formula based on SCr of 0.86 mg/dL).  Coagulation Profile: Recent Labs  Lab 11/03/20 0230 11/04/20 0740  INR 1.6* 1.1    Recent Results (from the past 240 hour(s))  Blood culture (routine single)     Status: None (Preliminary result)   Collection Time: 11/03/20  2:53 AM   Specimen: BLOOD LEFT WRIST  Result Value Ref Range Status   Specimen Description   Final    BLOOD LEFT WRIST Performed at [facility] [address] [place], [place] [address]    Special Requests   Final    BOTTLES DRAWN AEROBIC AND ANAEROBIC Blood Culture results may not be optimal due to an inadequate volume of blood received in culture bottles Performed at [facility] [address], [place], [place] [address]    Culture   Final    NO GROWTH < 12 HOURS Performed at [facility] [address] [place], [place] [address]    Report Status PENDING  Incomplete  Urine culture     Status: Abnormal   Collection Time: 11/03/20  3:30 AM   Specimen: In/Out Cath Urine  Result Value Ref Range Status   Specimen Description   Final    IN/OUT CATH URINE Performed at [facility] [address], [place], [place] [address]    Special Requests   Final    NONE Performed at [facility], [place] [address], [place], [place] [address]    Culture MULTIPLE SPECIES PRESENT, SUGGEST RECOLLECTION (A)  Final   Report Status 11/04/2020 FINAL  Final  Resp  Panel by RT-PCR (Flu A&B, Covid) Nasopharyngeal Swab     Status: None   Collection Time: 11/03/20 10:20 AM   Specimen: Nasopharyngeal Swab; Nasopharyngeal(NP) swabs in vial transport medium  Result Value Ref Range Status   SARS Coronavirus 2 by RT PCR NEGATIVE NEGATIVE Final    Comment: (NOTE) SARS-CoV-2 target nucleic acids are NOT DETECTED.  The SARS-CoV-2 RNA is generally detectable in upper respiratory specimens during the acute phase of infection. The lowest concentration of SARS-CoV-2 viral copies this assay can detect is 138 copies/mL. A negative result does not preclude SARS-Cov-2 infection and should not be used as the sole basis for treatment or other patient management decisions. A negative result may occur with  improper specimen collection/handling, submission of specimen other than nasopharyngeal swab, presence of viral mutation(s) within the areas targeted by this assay, and inadequate number of viral copies(<138 copies/mL). A negative result must be combined with clinical observations, patient history, and epidemiological information. The expected result is Negative.  Fact Sheet for Patients:  EntrepreneurPulse.com.au  Fact Sheet for Healthcare Providers:  IncredibleEmployment.be  This test is no t yet approved or cleared by the [place] FDA and  has been authorized for detection and/or diagnosis of SARS-CoV-2 by FDA under an Emergency Use Authorization (EUA). This EUA will remain  in effect (meaning this test can be used) for the duration of the COVID-19 declaration under Section 564(b)(1) of the Act, 21 U.S.C.section 360bbb-3(b)(1), unless the authorization is terminated  or revoked sooner.       Influenza A by PCR NEGATIVE NEGATIVE Final   Influenza B by PCR NEGATIVE NEGATIVE Final    Comment: (NOTE) The Xpert Xpress SARS-CoV-2/FLU/RSV plus assay is intended as an aid in the diagnosis of influenza from Nasopharyngeal  swab specimens and should not be used as a sole basis for treatment. Nasal washings and aspirates are unacceptable for Xpert Xpress SARS-CoV-2/FLU/RSV testing.  Fact Sheet for Patients: EntrepreneurPulse.com.au  Fact Sheet for Healthcare Providers: IncredibleEmployment.be  This test is not yet approved or cleared by the [place] FDA and has been authorized for detection and/or diagnosis of SARS-CoV-2 by FDA under an Emergency Use Authorization (EUA). This EUA will remain in effect (meaning this test can be used) for the duration of the COVID-19 declaration under Section 564(b)(1) of the Act, 21 U.S.C. section 360bbb-3(b)(1), unless the authorization is terminated or revoked.  Performed at [facility], [place] [address], [place], [place] [address]         Radiology Studies: CT ABDOMEN PELVIS W CONTRAST  Result Date: 11/03/2020 CLINICAL DATA:  Abdominal pain.  Bladder cancer. EXAM: CT ABDOMEN AND PELVIS WITH CONTRAST TECHNIQUE: Multidetector CT imaging of the abdomen and pelvis was performed using the standard protocol following bolus administration of intravenous contrast. CONTRAST:  36mL OMNIPAQUE IOHEXOL 300 MG/ML  SOLN COMPARISON:  10/08/2020 FINDINGS: Lower chest:  No acute finding Hepatobiliary: Multiple hepatic cysts measuring up to 5.4 cm in the right upper lobe.No evidence of biliary obstruction or stone. Pancreas: Unremarkable. Spleen: Unremarkable. Adrenals/Urinary Tract: Negative adrenals. No hydronephrosis or ureteral stone. Multiple renal calculi, more numerous on the right where the largest stone measures 14 mm. Right renal cortical scarring asymmetric to the right. Bulky necrotic tumor at the dome of the bladder with high-density in the bladder lumen likely reflecting both clot and calcification. The confluent mass measures at least 9 cm craniocaudal. Foley catheter is in expected position. The mass is intimately  associated with small bowel loops in the pelvis, without bowel obstruction or demonstrable fistulization. Stomach/Bowel: Descending colostomy. No bowel obstruction or visible inflammation. Vascular/Lymphatic: No acute vascular abnormality. Atherosclerosis of the aorta and iliacs. There are a few pelvic lymph nodes which show prominent enhancement but are strictly nonenlarged. An 8 mm node along the right iliac chain on 2:66 is an example. No para-aortic adenopathy is seen. Reproductive:Unremarkable Other: No ascites or pneumoperitoneum. Musculoskeletal: No acute abnormalities. IMPRESSION: 1. Bulky bladder mass with progression from 10/08/2020. The mass appears necrotic with complex material in the bladder lumen. Foley catheter is normally positioned. 2. The mass is intimately associated with pelvic small bowel loops. Fistulization is possible, especially given the degree of bladder gas, but not clearly demonstrated. Please correlate with Foley catheter output. No small bowel obstruction. 3. Possible early nodal metastatic disease in the pelvis. 4. Right renal atrophy and multiple calculi. Electronically Signed   By: Monte Fantasia M.D.   On: 11/03/2020 07:57   DG Chest Port 1 View  Result Date:  11/03/2020 CLINICAL DATA:  Bladder pain and questionable sepsis. EXAM: PORTABLE CHEST 1 VIEW COMPARISON:  October 10, 2020 FINDINGS: Very mild, stable biapical atelectasis and scarring is seen. There is no evidence of acute infiltrate, pleural effusion or pneumothorax. The heart size and mediastinal contours are within normal limits. The visualized skeletal structures are unremarkable. IMPRESSION: Stable exam without acute or active cardiopulmonary disease. Electronically Signed   By: Virgina Norfolk M.D.   On: 11/03/2020 00:12        Scheduled Meds: . B-complex with vitamin C  1 tablet Oral Daily  . folic acid  1 mg Oral Daily  . potassium chloride  40 mEq Oral Q4H  . sodium chloride flush  10-40 mL  Intracatheter Q12H  . cyanocobalamin  1,000 mcg Oral Daily   Continuous Infusions: . sodium chloride    . sodium chloride Stopped (11/03/20 1835)  . dextrose 10 % and 0.45 % NaCl 75 mL/hr at 11/03/20 1837  . piperacillin-tazobactam (ZOSYN)  IV Stopped (11/04/20 1229)  . vancomycin Stopped (11/04/20 1526)     LOS: 1 day     Cordelia Poche, MD [facility] 11/04/2020, 3:27 PM  If 7PM-7AM, please contact night-coverage [email or web address]

## 2020-11-05 ENCOUNTER — Other Ambulatory Visit: Payer: Self-pay

## 2020-11-05 ENCOUNTER — Inpatient Hospital Stay (HOSPITAL_COMMUNITY): Payer: Medicaid Other

## 2020-11-05 DIAGNOSIS — C679 Malignant neoplasm of bladder, unspecified: Secondary | ICD-10-CM | POA: Diagnosis not present

## 2020-11-05 DIAGNOSIS — N390 Urinary tract infection, site not specified: Secondary | ICD-10-CM | POA: Diagnosis not present

## 2020-11-05 DIAGNOSIS — A419 Sepsis, unspecified organism: Secondary | ICD-10-CM | POA: Diagnosis not present

## 2020-11-05 DIAGNOSIS — Z7189 Other specified counseling: Secondary | ICD-10-CM

## 2020-11-05 DIAGNOSIS — E46 Unspecified protein-calorie malnutrition: Secondary | ICD-10-CM

## 2020-11-05 DIAGNOSIS — N3001 Acute cystitis with hematuria: Secondary | ICD-10-CM | POA: Diagnosis not present

## 2020-11-05 DIAGNOSIS — Z515 Encounter for palliative care: Secondary | ICD-10-CM

## 2020-11-05 LAB — BASIC METABOLIC PANEL
Anion gap: 7 (ref 5–15)
BUN: 14 mg/dL (ref 8–23)
CO2: 20 mmol/L — ABNORMAL LOW (ref 22–32)
Calcium: 11.1 mg/dL — ABNORMAL HIGH (ref 8.9–10.3)
Chloride: 111 mmol/L (ref 98–111)
Creatinine, Ser: 0.84 mg/dL (ref 0.61–1.24)
GFR, Estimated: >60 mL/min (ref >60)
Glucose, Bld: 93 mg/dL (ref 70–99)
Potassium: 3.6 mmol/L (ref 3.5–5.1)
Sodium: 138 mmol/L (ref 135–145)

## 2020-11-05 LAB — CBC
HCT: 35.7 % — ABNORMAL LOW (ref 39.0–52.0)
Hemoglobin: 11 g/dL — ABNORMAL LOW (ref 13.0–17.0)
MCH: 28.4 pg (ref 26.0–34.0)
MCHC: 30.8 g/dL (ref 30.0–36.0)
MCV: 92 fL (ref 80.0–100.0)
Platelets: 316 10*3/uL (ref 150–400)
RBC: 3.88 MIL/uL — ABNORMAL LOW (ref 4.22–5.81)
RDW: 15.2 % (ref 11.5–15.5)
WBC: 17 10*3/uL — ABNORMAL HIGH (ref 4.0–10.5)
nRBC: 0 % (ref 0.0–0.2)

## 2020-11-05 LAB — GLUCOSE, CAPILLARY
Glucose-Capillary: 108 mg/dL — ABNORMAL HIGH (ref 70–99)
Glucose-Capillary: 117 mg/dL — ABNORMAL HIGH (ref 70–99)
Glucose-Capillary: 123 mg/dL — ABNORMAL HIGH (ref 70–99)
Glucose-Capillary: 146 mg/dL — ABNORMAL HIGH (ref 70–99)

## 2020-11-05 LAB — FOLATE: Folate: 11.7 ng/mL (ref >5.9)

## 2020-11-05 LAB — URINE CULTURE

## 2020-11-05 LAB — IRON AND TIBC
Iron: 29 ug/dL — ABNORMAL LOW (ref 45–182)
Saturation Ratios: 16 % — ABNORMAL LOW (ref 17.9–39.5)
TIBC: 187 ug/dL — ABNORMAL LOW (ref 250–450)
UIBC: 158 ug/dL

## 2020-11-05 LAB — FERRITIN: Ferritin: 244 ng/mL (ref 24–336)

## 2020-11-05 LAB — VITAMIN B12: Vitamin B-12: 746 pg/mL (ref 180–914)

## 2020-11-05 MED ORDER — CEFDINIR 300 MG PO CAPS
300.0000 mg | ORAL_CAPSULE | Freq: Two times a day (BID) | ORAL | Status: DC
  Administered 2020-11-05 (×2): 300 mg via ORAL
  Filled 2020-11-05 (×3): qty 1

## 2020-11-05 MED ORDER — ENSURE ENLIVE PO LIQD
237.0000 mL | Freq: Three times a day (TID) | ORAL | Status: DC
  Administered 2020-11-05 – 2020-11-28 (×40): 237 mL via ORAL

## 2020-11-05 MED ORDER — MORPHINE SULFATE (PF) 2 MG/ML IV SOLN
2.0000 mg | Freq: Once | INTRAVENOUS | Status: AC
  Administered 2020-11-05: 2 mg via INTRAVENOUS
  Filled 2020-11-05: qty 1

## 2020-11-05 MED ORDER — PROSOURCE PLUS PO LIQD
30.0000 mL | Freq: Two times a day (BID) | ORAL | Status: DC
  Administered 2020-11-05 – 2020-11-29 (×35): 30 mL via ORAL
  Filled 2020-11-05 (×35): qty 30

## 2020-11-05 MED ORDER — HYDROCERIN EX CREA
TOPICAL_CREAM | Freq: Two times a day (BID) | CUTANEOUS | Status: DC
  Administered 2020-11-06 – 2020-12-21 (×14): 1 via TOPICAL
  Filled 2020-11-05 (×5): qty 113

## 2020-11-05 MED ORDER — NICOTINE 14 MG/24HR TD PT24
14.0000 mg | MEDICATED_PATCH | Freq: Every day | TRANSDERMAL | Status: DC
  Administered 2020-11-05 – 2020-12-23 (×49): 14 mg via TRANSDERMAL
  Filled 2020-11-05 (×49): qty 1

## 2020-11-05 MED ORDER — SODIUM CHLORIDE 0.9% FLUSH
10.0000 mL | Freq: Two times a day (BID) | INTRAVENOUS | Status: DC
  Administered 2020-11-06 – 2020-11-09 (×3): 10 mL

## 2020-11-05 MED ORDER — SODIUM CHLORIDE 0.9% FLUSH: 10.0000 mL | INTRAVENOUS | Status: DC | PRN

## 2020-11-05 MED ORDER — HYDROXYZINE HCL 10 MG/5ML PO SYRP
50.0000 mg | ORAL_SOLUTION | Freq: Three times a day (TID) | ORAL | Status: DC | PRN
  Administered 2020-11-05 – 2020-12-04 (×4): 50 mg via ORAL
  Filled 2020-11-05 (×8): qty 25

## 2020-11-05 MED ORDER — CHLORHEXIDINE GLUCONATE CLOTH 2 % EX PADS
6.0000 | MEDICATED_PAD | Freq: Every day | CUTANEOUS | Status: DC
  Administered 2020-11-05 – 2020-12-23 (×42): 6 via TOPICAL

## 2020-11-05 MED ORDER — OXYCODONE HCL 5 MG PO TABS: 5.0000 mg | ORAL_TABLET | ORAL | Status: DC | PRN

## 2020-11-05 MED ORDER — DIPHENHYDRAMINE HCL 25 MG PO CAPS
25.0000 mg | ORAL_CAPSULE | Freq: Four times a day (QID) | ORAL | Status: DC | PRN
  Administered 2020-11-05: 25 mg via ORAL
  Filled 2020-11-05: qty 1

## 2020-11-05 MED ORDER — OXYCODONE-ACETAMINOPHEN 5-325 MG PO TABS
1.0000 | ORAL_TABLET | ORAL | Status: DC | PRN
  Administered 2020-11-05 – 2020-11-19 (×59): 2 via ORAL
  Administered 2020-11-19: 1 via ORAL
  Administered 2020-11-19: 2 via ORAL
  Administered 2020-11-19 – 2020-11-20 (×3): 1 via ORAL
  Filled 2020-11-05 (×4): qty 2
  Filled 2020-11-05 (×3): qty 1
  Filled 2020-11-05 (×21): qty 2
  Filled 2020-11-05: qty 1
  Filled 2020-11-05 (×2): qty 2
  Filled 2020-11-05: qty 1
  Filled 2020-11-05 (×5): qty 2
  Filled 2020-11-05: qty 1
  Filled 2020-11-05 (×22): qty 2
  Filled 2020-11-05: qty 1
  Filled 2020-11-05 (×5): qty 2

## 2020-11-05 NOTE — Progress Notes (Signed)
  Subjective: Pain controlled. Foley draining purulent urine. No other complaints  Objective: Vital signs in last 24 hours: Temp:  [97.7 F (36.5 C)-98 F (36.7 C)] 97.7 F (36.5 C) (01/08 1541) Pulse Rate:  [74-92] 79 (01/08 1541) Resp:  [16-20] 20 (01/08 1541) BP: (106-146)/(58-74) 108/63 (01/08 1541) SpO2:  [94 %-99 %] 97 % (01/08 1541)  Intake/Output from previous day: 01/07 0701 - 01/08 0700 In: 950 [P.O.:800; IV Piggyback:150] Out: 1575 [Urine:1575] Intake/Output this shift: Total I/O In: 600 [P.O.:600] Out: 1625 [Urine:1625]   UOP: not recorded in ED, purulent urine with debris  Physical Exam:  General: Alert and oriented CV: RRR Lungs: Clear Abdomen: Soft, ND, NT; ostomy in place with gas and stool in bag Ext: NT, No erythema  Lab Results: Recent Labs    11/03/20 0337 11/04/20 0740 11/05/20 0925  HGB 10.9* 10.6* 11.0*  HCT 32.0* 32.6* 35.7*   BMET Recent Labs    11/04/20 0740 11/05/20 0925  NA 136 138  K 2.8* 3.6  CL 108 111  CO2 22 20*  GLUCOSE 172* 93  BUN 13 14  CREATININE 0.86 0.84  CALCIUM 10.7* 11.1*     Studies/Results: DG Foot 2 Views Left  Result Date: 11/05/2020 CLINICAL DATA:  Pain.  No injury.  Possible gout. EXAM: LEFT FOOT - 2 VIEW COMPARISON:  None. FINDINGS: There is no evidence of fracture or dislocation. There is no evidence of arthropathy or other focal bone abnormality. Soft tissues are unremarkable. IMPRESSION: Negative. Electronically Signed   By: Dorise Bullion III M.D   On: 11/05/2020 10:36   DG Foot 2 Views Right  Result Date: 11/05/2020 CLINICAL DATA:  Pain.  No trauma. EXAM: RIGHT FOOT - 2 VIEW COMPARISON:  None. FINDINGS: There is no evidence of fracture or dislocation. There is no evidence of arthropathy or other focal bone abnormality. Soft tissues are unremarkable. IMPRESSION: Negative. Electronically Signed   By: Dorise Bullion III M.D   On: 11/05/2020 10:37    Assessment/Plan: 1. 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  S/p TURBT large with Dr. Jeffie Pollock on 10/06/2020.  At the time, he was found to have a very large necrotic bladder tumor at the dome, anterior and posterior walls of the bladder.  CT A/P 11/03/2020 with progression of bladder mass associated with areas of necrosis and intraluminal air.  Potentially metastatic pelvic lymph nodes which are enhancing measuring up to 8 mm. 2.  UTI due to necrotic, infected large bladder mass 3.  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  However, this may be related to necrotic tumor with underlying infection.  No identification of colovesicular fistula during TURBT on 10/06/2020 with Dr. Jeffie Pollock. 4.  Bilateral nephrolithiasis: CT A/P 11/03/2020 with bilateral nonobstructing stones.  No evidence of ureteral stone.  Of note, prior CT A/P 10/02/2020 revealed 3 mm proximal right ureteral stone which is now no longer present.  -foley draining well.  -Manually irrigate his bladder as needed for decreased drainage, obstructive debris or clots. -No sign of gross hematuria presently.  LOS: 2 days   Nicolette Bang

## 2020-11-05 NOTE — Consult Note (Signed)
Consultation Note Date: 11/05/2020   Patient Name: Jamie Wilson  DOB: 1956-06-05  MRN: [id number]  Age / Sex: 65 y.o., male  PCP: Patient, [name] Referring Physician: Mariel Aloe, MD  Reason for Consultation: Establishing goals of care  HPI/Patient Profile: 65 y.o. male  with past medical history of urothelial carcinoma of the bladder s/p TURBT on 10/06/20, chronic foley placement, bowel obstruction s/p partial bowel resection with colostomy admitted on 11/02/2020 with abdominal pain. Found to have sepsis secondary to necrotic bladder mass. Antibiotics initiated. Urology and oncology following. CT a/p reveals bladder mass with progression from previous scan. Followed by Dr. Irene Wilson. Patient is a poor candidate for chemotherapy with poor functional status, poor nutritional status, lack of social support, and recurrent UTI's with sepsis. Per urology, may benefit from repeat TURBT to debulk infected necrotic bladder mass prior to ? Chemotherapy and radical cystectomy. Palliative medicine consultation for goals of care.    Clinical Assessment and Goals of Care:  I have reviewed medical records, discussed with Dr. Lonny Wilson, and met with patient at bedside. Jamie Wilson is awake, alert, oriented and able to participate in discussion. He c/o ongoing lower abdominal/bladder discomfort. He is waiting on Dr. Lonny Wilson to adjust orders. Also c/o of foot pain with pending xrays.  I introduced Palliative Medicine as specialized medical care for people living with serious illness. It focuses on providing relief from the symptoms and stress of a serious illness. The goal is to improve quality of life for both the patient and the family.  We discussed a brief life review of the patient. Divorced. Estranged from his family, who live in [place] and [place]. He has two sons and multiple grandchildren (from his knowledge living in [place]) but  he has not been in contact with them for many years. Jamie Wilson shares that he was in prison for 23 years, released approximately 1.5 years ago. Prior to hospitalization, living in a home with 5 other roommates. He reports his health was fair until late Summer 2021 when recurrent UTI's started and eventually further workup determined he had bladder cancer. He has basically been confined to his bed for ~3 months not only because of weakness and bladder discomfort, but ? Gout in his feet.   Discussed events leading up to admission and course of hospitalization including diagnoses, interventions, plan of care. Reviewed urology and oncology recommendations. Jamie Wilson shares understanding of possible plan for his bladder to be removed and PET scan outpatient. He acknowledges poor candidacy for chemotherapy and shares he would only want chemotherapy if 'absolutely necessary' because of many bad experiences/side effects with previous friends/family receiving chemo. He felt that in his experience, his friends/family members died faster from chemotherapy. He speaks of importance of quality of life and to 'live with the cancer' instead of pursing chemotherapy. Reassured of ongoing discussions based on urology/oncology follow-up and recommendations.   Discussed pending dietician consultation and importance of trying to boost his nutritional status and work with physical therapy to improve his functional status.  Advanced directives, concepts specific to code status, artifical feeding and hydration were discussed. Patient does not have a living will. He is estranged from his family and has poor social support. He does confirm emergency contact numbers as two friends Jamie Wilson and Jamie Wilson). Introduced and reviewed AD packet. Encouraged he consider and speak with Jamie Wilson and Jamie Wilson about POA status and importance of documenting a trusting decision maker if he was unable to make medical decisions for himself. Discussed code status.  Jamie Wilson shares at this time, he does wish to remain a full code and would want interventions to be attempted because he hasn't seen his grandchildren. He plans to review AD packet and consider discussing with friends Jamie Wilson and Jamie Wilson.   When Jamie Wilson gets paid again at the beginning of February, his plan is to turn on his other phone and attempt contact with his family, especially his two sons. He would like to hear from them and hopeful to eventually meet his grandchildren.   At this point, Jamie Wilson is hopeful to 'get rid of the cancer' if his bladder can be removed. Again, he wishes to get in contact with his sons. 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 He is hopeful he could eventually work with physical therapy and walk again.   Questions and concerns were addressed.    SUMMARY OF RECOMMENDATIONS    Patient desires full code/full scope treatment.  Outpatient oncology and urology follow-up. He does acknowledge poor candidacy for chemotherapy at this point but hopeful that bladder may be removed and he be cured of his cancer. Plan is for outpatient PET scan.  Introduced and encouraged AD packet completion. Patient unfortunately has poor social support. Estranged from family (two sons). Confirms his two friends Jamie Wilson and Jamie Wilson) are emergency contact numbers.  Dietician consult pending.  Needs PT attempts.  May benefit from outpatient palliative for ongoing support and [employer] discussions pending urology/oncology recommendations.   Code Status/Advance Care Planning:  Full code  Symptom Management:   Per attending  Palliative Prophylaxis:   Bowel Regimen, Delirium Protocol, Frequent Pain Assessment, Oral Care and Turn Reposition  Psycho-social/Spiritual:   Desire for further Chaplaincy support: yes  Additional Recommendations: Caregiving  Support/Resources  Prognosis:   Unable to determine  Discharge Planning: To Be Determined      Primary  Diagnoses: Present on Admission: . Sepsis ([place]) . Malnutrition ([place])   I have reviewed the medical record, interviewed the patient and family, and examined the patient. The following aspects are pertinent.  Past Medical History:  Diagnosis Date  . Bladder cancer ([place])   . Colostomy care ([place])   . Toe infection    Social History   Socioeconomic History  . Marital status: Divorced    Spouse name: Not on file  . Number of children: Not on file  . Years of education: Not on file  . Highest education level: Not on file  Occupational History  . Not on file  Tobacco Use  . Smoking status: Current Some Day Smoker  . Smokeless tobacco: Never Used  Substance and Sexual Activity  . Alcohol use: Yes  . Drug use: Yes    Types: Marijuana  . Sexual activity: Not on file  Other Topics Concern  . Not on file  Social History Narrative  . Not on file   Social Determinants of Health   Financial Resource Strain: Not on file  Food Insecurity: Not on file  Transportation Needs: Not on file  Physical Activity: Not on file  Stress: Not on file  Social Connections: Not on file   Family History  Problem Relation Age of Onset  . CAD Neg Hx   . Heart failure Neg Hx    Scheduled Meds: . B-complex with vitamin C  1 tablet Oral Daily  . cefdinir  300 mg Oral Q12H  . folic acid  1 mg Oral Daily  . sodium chloride flush  10-40 mL Intracatheter Q12H  . sodium chloride flush  10-40 mL Intracatheter Q12H  . cyanocobalamin  1,000 mcg Oral Daily   Continuous Infusions: . sodium chloride    . sodium chloride Stopped (11/03/20 1835)   PRN Meds:.acetaminophen **OR** acetaminophen, ondansetron **OR** ondansetron (ZOFRAN) IV, oxyCODONE-acetaminophen, sodium chloride flush, sodium chloride flush Medications Prior to Admission:  Prior to Admission medications   Medication Sig Start Date End Date Taking? Authorizing Provider  acetaminophen (TYLENOL) 500 MG tablet Take 2 tablets (1,000 mg total)  by mouth every 8 (eight) hours. Patient taking differently: Take 1,000 mg by mouth every 8 (eight) hours as needed for mild pain, moderate pain, fever or headache. 10/13/20  Yes Pokhrel, Laxman, MD  B Complex-C (B-COMPLEX WITH VITAMIN C) tablet Take 1 tablet by mouth daily. 10/13/20  Yes Pokhrel, Laxman, MD  folic acid (FOLVITE) 1 MG tablet Take 1 tablet (1 mg total) by mouth daily. 10/13/20  Yes Pokhrel, Laxman, MD  tamsulosin (FLOMAX) 0.4 MG CAPS capsule Take 1 capsule (0.4 mg total) by mouth daily. 08/28/20  Yes Barb Merino, MD  vitamin B-12 1000 MCG tablet Take 1 tablet (1,000 mcg total) by mouth daily. 10/13/20  Yes Pokhrel, Laxman, MD  bisacodyl (DULCOLAX) 5 MG EC tablet Take 1 tablet (5 mg total) by mouth daily as needed for moderate constipation. Patient not taking: Reported on 11/03/2020 10/13/20   Pokhrel, Corrie Mckusick, MD  oxyCODONE (OXY IR/ROXICODONE) 5 MG immediate release tablet Take 1-2 tablets (5-10 mg total) by mouth every 6 (six) hours as needed for moderate pain or severe pain (5 moderate, 10 severe). Patient not taking: Reported on 11/03/2020 10/13/20   Pokhrel, Corrie Mckusick, MD  polyethylene glycol (MIRALAX / GLYCOLAX) 17 g packet Take 17 g by mouth 2 (two) times daily. Patient not taking: No sig reported 08/28/20   Barb Merino, MD  senna-docusate (SENOKOT-S) 8.6-50 MG tablet Take 1 tablet by mouth 2 (two) times daily. Patient not taking: Reported on 11/03/2020 10/13/20   Flora Lipps, MD   Allergies  Allergen Reactions  . Silvadene [Silver Sulfadiazine] Rash   Review of Systems  Constitutional: Positive for activity change.       Abdominal/bladder pain, foot pain with pending xrays  Neurological: Positive for weakness.   Physical Exam Vitals and nursing note reviewed.  Constitutional:      General: He is awake.     Appearance: He is cachectic. He is ill-appearing.  HENT:     Head: Normocephalic and atraumatic.  Cardiovascular:     Heart sounds: Normal heart sounds.   Pulmonary:     Effort: No tachypnea, accessory muscle usage or respiratory distress.  Skin:    General: Skin is warm and dry.     Coloration: Skin is pale.  Neurological:     Mental Status: He is alert and oriented to person, place, and time.  Psychiatric:        Mood and Affect: Mood normal.        Speech: Speech normal.        Behavior: Behavior normal.  Cognition and Memory: Cognition normal.    Vital Signs: BP 108/63 (BP Location: Left Arm)   Pulse 79   Temp 97.7 F (36.5 C) (Oral)   Resp 20   Ht _0  (1.854 m)   Wt 50.7 kg   SpO2 97%   BMI 14.74 kg/m  Pain Scale: 0-10 POSS *See Group Information*: 1-Acceptable,Awake and alert Pain Score: 9    SpO2: SpO2: 97 % O2 Device:SpO2: 97 % O2 Flow Rate: .O2 Flow Rate (L/min): 2 L/min  IO: Intake/output summary:   Intake/Output Summary (Last 24 hours) at 11/05/2020 1557 Last data filed at 11/05/2020 1545 Gross per 24 hour  Intake 1400 ml  Output 3200 ml  Net -1800 ml    LBM: Last BM Date: 11/05/19 Baseline Weight: Weight: 50.7 kg Most recent weight: Weight: 50.7 kg     Palliative Assessment/Data: PPS 40%   Flowsheet Rows   Flowsheet Row Most Recent Value  Intake Tab   Referral Department Hospitalist  Unit at Time of Referral Med/Surg Unit  Palliative Care Primary Diagnosis Cancer  Palliative Care Type New Palliative care  Reason for referral Clarify Goals of Care  Date first seen by Palliative Care 11/05/20  Clinical Assessment   Palliative Performance Scale Score 40%  Psychosocial & Spiritual Assessment   Palliative Care Outcomes   Patient/Family meeting held? Yes  Who was at the meeting? patient  Palliative Care Outcomes Clarified goals of care, Provided psychosocial or spiritual support, Linked to palliative care logitudinal support, ACP counseling assistance      Time Total: 75 Greater than 50%  of this time was spent counseling and coordinating care related to the above assessment and  plan.  Signed by:  Ihor Dow, DNP, FNP-C Palliative Medicine Team  Phone: [phone] Fax: [phone]   Please contact Palliative Medicine Team phone at [phone] for questions and concerns.  For individual provider: See Shea Evans

## 2020-11-05 NOTE — Progress Notes (Signed)
PROGRESS NOTE    Ramello Cordial  PXT:[id number] DOB: Mar 24, 1956 DOA: 11/02/2020 PCP: Patient, [name]   Brief Narrative: Marqueze Ramcharan is a 65 y.o. 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 Patient presented secondary to abdominal pain and found to have evidence of sepsis possibly secondary to necrotic bladder mass. Empiric antibiotics initiated. Urology and oncology consulted.   Assessment & Plan:   Active Problems:   Malnutrition ([place])   Sepsis ([place])   UTI (urinary tract infection)   Sepsis Present on admission. Possible urinary source. CT abd/pelvis significant for a necrotic bladder mass which may be contributing as well. Started on Vancomycin and Zosyn on admission. Blood (1 set)/urine cultures obtained on admission. Urine culture with multiple species. Repeat culture is similar. Blood culture with no growth to date. -Discontinue Vancomycin and Zosyn -Start Cefdinir  Urothelial carcinoma of the bladder Necrotic per description. Urology consulted. Consideration of TURBT for debulking. Foley replaced by urology with purulent drainage noted. -Urology recommendations -Medical oncology recommendations: poor candidate for neoadjuvant chemotherapy with recommendation to consider cystectomy prior to adjuvant chemo/immunotherapy vs radiation oncology for concurrent chemo/radiation therapy  Underweight Concern for malnutrition -Dietitian consulted  Possible UTI In setting of necrotic mass. As mentioned above.  Bilateral toe pain Appears to be possible pressure injury. Does not appear infected but is tender. -WOC consult -Bilateral foot x-ray  Chronic anemia Severely low hemoglobin appears to be erroneous. Hemoglobin of around 10-11 which is baseline.  Hypoglycemia -Discontinue D10 IV fluids  Coagulopathy Initial INR of 1.6 which has improved to 1.1 and is at baseline   DVT  prophylaxis: SCDs Code Status:   Code Status: Full Code Family Communication: None at bedside Disposition Plan: Discharge home vs SNF likely in several days pending urology/medical oncology recommendations   Consultants:   Urology  Medical oncology  Procedures:   None  Antimicrobials:  Vancomycin  Zosyn    Subjective: No issues overnight.  Objective: Vitals:   11/04/20 1800 11/04/20 1837 11/04/20 2120 11/05/20 0444  BP: (!) 106/58 (!) 146/67 107/68 138/74  Pulse: 74 78 74 92  Resp: 16 20 18 16   Temp:  97.7 F (36.5 C) 98 F (36.7 C) 97.9 F (36.6 C)  TempSrc:  Oral Oral Oral  SpO2: 96% 99% 95% 94%  Weight:      Height:        Intake/Output Summary (Last 24 hours) at 11/05/2020 1334 Last data filed at 11/05/2020 1100 Gross per 24 hour  Intake 1380 ml  Output 2275 ml  Net -895 ml   Filed Weights   11/03/20 0852  Weight: 50.7 kg    Examination:  General exam: Appears calm and comfortable Respiratory system: Clear to auscultation. Respiratory effort normal. Cardiovascular system: S1 & S2 heard, RRR. No murmurs, rubs, gallops or clicks. Gastrointestinal system: Abdomen is nondistended, soft and nontender. No organomegaly or masses felt. Normal bowel sounds heard. Central nervous system: Alert and oriented. No focal neurological deficits. Musculoskeletal: No edema. No calf tenderness Skin: No cyanosis. No rashes. Bilateral first toes with erythema and no ulceration noted Psychiatry: Judgement and insight appear normal. Mood & affect appropriate.     Data Reviewed: I have personally reviewed following labs and imaging studies  CBC Lab Results  Component Value Date   WBC 17.0 (H) 11/05/2020   RBC 3.88 (L) 11/05/2020   HGB 11.0 (L) 11/05/2020   HCT 35.7 (L) 11/05/2020  MCV 92.0 11/05/2020   MCH 28.4 11/05/2020   PLT 316 11/05/2020   MCHC 30.8 11/05/2020   RDW 15.2 11/05/2020   LYMPHSABS 1.3 11/04/2020   MONOABS 0.7 11/04/2020   EOSABS 1.9 (H)  11/04/2020   BASOSABS 0.1 70/26/3785     Last metabolic panel Lab Results  Component Value Date   NA 138 11/05/2020   K 3.6 11/05/2020   CL 111 11/05/2020   CO2 20 (L) 11/05/2020   BUN 14 11/05/2020   CREATININE 0.84 11/05/2020   GLUCOSE 93 11/05/2020   GFRNONAA >60 11/05/2020   GFRAA >60 11/21/2019   CALCIUM 11.1 (H) 11/05/2020   PHOS 1.7 (L) 10/05/2020   PROT 5.6 (L) 11/04/2020   ALBUMIN 2.0 (L) 11/04/2020   BILITOT 0.3 11/04/2020   ALKPHOS 70 11/04/2020   AST 14 (L) 11/04/2020   ALT 10 11/04/2020   ANIONGAP 7 11/05/2020    CBG (last 3)  Recent Labs    11/04/20 2355 11/05/20 0439 11/05/20 1120  GLUCAP 117* 117* 146*     GFR: Estimated Creatinine Clearance: 62.9 mL/min (by C-G formula based on SCr of 0.84 mg/dL).  Coagulation Profile: Recent Labs  Lab 11/03/20 0230 11/04/20 0740  INR 1.6* 1.1    Recent Results (from the past 240 hour(s))  Blood culture (routine single)     Status: None (Preliminary result)   Collection Time: 11/03/20  2:53 AM   Specimen: BLOOD LEFT WRIST  Result Value Ref Range Status   Specimen Description   Final    BLOOD LEFT WRIST Performed at [facility] [address], [place], [place] [address]    Special Requests   Final    BOTTLES DRAWN AEROBIC AND ANAEROBIC Blood Culture results may not be optimal due to an inadequate volume of blood received in culture bottles Performed at [facility] [address], [place], [place] [address]    Culture   Final    NO GROWTH 2 DAYS Performed at [facility] [address], [place], [place] [address]    Report Status PENDING  Incomplete  Urine culture     Status: Abnormal   Collection Time: 11/03/20  3:30 AM   Specimen: In/Out Cath Urine  Result Value Ref Range Status   Specimen Description   Final    IN/OUT CATH URINE Performed at [facility] [address] [place], [place] [address]    Special Requests   Final     NONE Performed at [facility], [place] [address] [place], [place] [address]    Culture MULTIPLE SPECIES PRESENT, SUGGEST RECOLLECTION (A)  Final   Report Status 11/04/2020 FINAL  Final  Resp Panel by RT-PCR (Flu A&B, Covid) Nasopharyngeal Swab     Status: None   Collection Time: 11/03/20 10:20 AM   Specimen: Nasopharyngeal Swab; Nasopharyngeal(NP) swabs in vial transport medium  Result Value Ref Range Status   SARS Coronavirus 2 by RT PCR NEGATIVE NEGATIVE Final    Comment: (NOTE) SARS-CoV-2 target nucleic acids are NOT DETECTED.  The SARS-CoV-2 RNA is generally detectable in upper respiratory specimens during the acute phase of infection. The lowest concentration of SARS-CoV-2 viral copies this assay can detect is 138 copies/mL. A negative result does not preclude SARS-Cov-2 infection and should not be used as the sole basis for treatment or other patient management decisions. A negative result may occur with  improper specimen collection/handling, submission of specimen other than nasopharyngeal swab, presence of  viral mutation(s) within the areas targeted by this assay, and inadequate number of viral copies(<138 copies/mL). A negative result must be combined with clinical observations, patient history, and epidemiological information. The expected result is Negative.  Fact Sheet for Patients:  EntrepreneurPulse.com.au  Fact Sheet for Healthcare Providers:  IncredibleEmployment.be  This test is no t yet approved or cleared by the [place] FDA and  has been authorized for detection and/or diagnosis of SARS-CoV-2 by FDA under an Emergency Use Authorization (EUA). This EUA will remain  in effect (meaning this test can be used) for the duration of the COVID-19 declaration under Section 564(b)(1) of the Act, 21 U.S.C.section 360bbb-3(b)(1), unless the authorization is terminated  or revoked sooner.       Influenza A by  PCR NEGATIVE NEGATIVE Final   Influenza B by PCR NEGATIVE NEGATIVE Final    Comment: (NOTE) The Xpert Xpress SARS-CoV-2/FLU/RSV plus assay is intended as an aid in the diagnosis of influenza from Nasopharyngeal swab specimens and should not be used as a sole basis for treatment. Nasal washings and aspirates are unacceptable for Xpert Xpress SARS-CoV-2/FLU/RSV testing.  Fact Sheet for Patients: EntrepreneurPulse.com.au  Fact Sheet for Healthcare Providers: IncredibleEmployment.be  This test is not yet approved or cleared by the [place] FDA and has been authorized for detection and/or diagnosis of SARS-CoV-2 by FDA under an Emergency Use Authorization (EUA). This EUA will remain in effect (meaning this test can be used) for the duration of the COVID-19 declaration under Section 564(b)(1) of the Act, 21 U.S.C. section 360bbb-3(b)(1), unless the authorization is terminated or revoked.  Performed at [facility], [place] [address], [place], [place] [address]   Culture, Urine     Status: Abnormal   Collection Time: 11/04/20 11:43 AM   Specimen: Urine, Catheterized  Result Value Ref Range Status   Specimen Description   Final    URINE, CATHETERIZED Performed at [facility] [address], [place], [place] [address]    Special Requests   Final    NONE Performed at [facility], [place] [address] [place], [place] [address]    Culture MULTIPLE SPECIES PRESENT, SUGGEST RECOLLECTION (A)  Final   Report Status 11/05/2020 FINAL  Final        Radiology Studies: DG Foot 2 Views Left  Result Date: 11/05/2020 CLINICAL DATA:  Pain.  No injury.  Possible gout. EXAM: LEFT FOOT - 2 VIEW COMPARISON:  None. FINDINGS: There is no evidence of fracture or dislocation. There is no evidence of arthropathy or other focal bone abnormality. Soft tissues are unremarkable. IMPRESSION: Negative. Electronically  Signed   By: Dorise Bullion III M.D   On: 11/05/2020 10:36   DG Foot 2 Views Right  Result Date: 11/05/2020 CLINICAL DATA:  Pain.  No trauma. EXAM: RIGHT FOOT - 2 VIEW COMPARISON:  None. FINDINGS: There is no evidence of fracture or dislocation. There is no evidence of arthropathy or other focal bone abnormality. Soft tissues are unremarkable. IMPRESSION: Negative. Electronically Signed   By: Dorise Bullion III M.D   On: 11/05/2020 10:37        Scheduled Meds: . B-complex with vitamin C  1 tablet Oral Daily  . folic acid  1 mg Oral Daily  . sodium chloride flush  10-40 mL Intracatheter Q12H  . sodium chloride flush  10-40 mL Intracatheter Q12H  . cyanocobalamin  1,000 mcg Oral Daily   Continuous Infusions: . sodium chloride    . sodium  chloride Stopped (11/03/20 1835)  . piperacillin-tazobactam (ZOSYN)  IV 3.375 g (11/05/20 0914)  . vancomycin 500 mg (11/05/20 1312)     LOS: 2 days     Cordelia Poche, MD [facility] 11/05/2020, 1:34 PM  If 7PM-7AM, please contact night-coverage [email or web address]

## 2020-11-05 NOTE — Progress Notes (Addendum)
Initial Nutrition Assessment  DOCUMENTATION CODES:   Severe malnutrition in context of chronic illness,Underweight  INTERVENTION:   -Ensure Enlive po TID, each supplement provides 350 kcal and 20 grams of protein  -Prosource Plus PO BID, each provides 100 kcals and 15g protein  NUTRITION DIAGNOSIS:   Severe Malnutrition related to chronic illness,cancer and cancer related treatments as evidenced by severe fat depletion,severe muscle depletion,percent weight loss.  GOAL:   Patient will meet greater than or equal to 90% of their needs  MONITOR:   PO intake,Supplement acceptance,Labs,I & O's,Weight trends  REASON FOR ASSESSMENT:   Consult Assessment of nutrition requirement/status  ASSESSMENT:   65 y.o. 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 Patient presented secondary to abdominal pain and found to have evidence of sepsis possibly secondary to necrotic bladder mass.  Patient familiar to this RD from previous admission.  Per chart review, pt discharged on 12/16. Was supposed to see oncology outpatient to possibly start chemotherapy. Pt has been eating poorly PTA d/t several months of abdominal pain.  Will order Ensure as pt enjoyed these last admission.   Per weight records, pt has lost 16 lbs since 12/15 (12% wt loss x <1 month, significant for time frame).   Medications: B-complex w/ Vitamin C, Folic acid, Vitamin R-48  Labs reviewed: CBGs: 117-146  NUTRITION - FOCUSED PHYSICAL EXAM:  Last exam was on 12/13, suspect depletions are the same given continued weight loss. Severe fat depletions and severe muscle depletions.   Diet Order:   Diet Order            Diet regular Room service appropriate? Yes; Fluid consistency: Thin  Diet effective now                 EDUCATION NEEDS:   No education needs have been identified at this time  Skin:  Skin Assessment:  Reviewed RN Assessment  Last BM:  1/7  Height:   Ht Readings from Last 1 Encounters:  11/02/20 6\' 1"  (1.854 m)    Weight:   Wt Readings from Last 1 Encounters:  11/03/20 50.7 kg    BMI:  Body mass index is 14.74 kg/m.  Estimated Nutritional Needs:   Kcal:  2100-2300  Protein:  100-115g  Fluid:  2.1L/day  Clayton Bibles, MS, RD, LDN Inpatient Clinical Dietitian Contact information available via Amion

## 2020-11-06 DIAGNOSIS — N3001 Acute cystitis with hematuria: Secondary | ICD-10-CM | POA: Diagnosis not present

## 2020-11-06 DIAGNOSIS — E46 Unspecified protein-calorie malnutrition: Secondary | ICD-10-CM | POA: Diagnosis not present

## 2020-11-06 DIAGNOSIS — A419 Sepsis, unspecified organism: Secondary | ICD-10-CM | POA: Diagnosis not present

## 2020-11-06 LAB — CBC
HCT: 33.4 % — ABNORMAL LOW (ref 39.0–52.0)
Hemoglobin: 10.5 g/dL — ABNORMAL LOW (ref 13.0–17.0)
MCH: 28.5 pg (ref 26.0–34.0)
MCHC: 31.4 g/dL (ref 30.0–36.0)
MCV: 90.5 fL (ref 80.0–100.0)
Platelets: 305 10*3/uL (ref 150–400)
RBC: 3.69 MIL/uL — ABNORMAL LOW (ref 4.22–5.81)
RDW: 15.3 % (ref 11.5–15.5)
WBC: 14.8 10*3/uL — ABNORMAL HIGH (ref 4.0–10.5)
nRBC: 0 % (ref 0.0–0.2)

## 2020-11-06 LAB — CREATININE, SERUM
Creatinine, Ser: 0.77 mg/dL (ref 0.61–1.24)
GFR, Estimated: >60 mL/min (ref >60)

## 2020-11-06 LAB — GLUCOSE, CAPILLARY
Glucose-Capillary: 111 mg/dL — ABNORMAL HIGH (ref 70–99)
Glucose-Capillary: 100 mg/dL — ABNORMAL HIGH (ref 70–99)

## 2020-11-06 MED ORDER — CIPROFLOXACIN HCL 500 MG PO TABS
500.0000 mg | ORAL_TABLET | Freq: Two times a day (BID) | ORAL | Status: DC
  Administered 2020-11-06 – 2020-11-09 (×7): 500 mg via ORAL
  Filled 2020-11-06 (×7): qty 1

## 2020-11-06 MED ORDER — POLYETHYLENE GLYCOL 3350 17 G PO PACK
17.0000 g | PACK | Freq: Every day | ORAL | Status: DC
  Administered 2020-11-07 – 2020-11-09 (×3): 17 g via ORAL
  Filled 2020-11-06 (×3): qty 1

## 2020-11-06 NOTE — Evaluation (Signed)
Occupational Therapy Evaluation Patient Details Name: Jamie Wilson MRN: [id number] DOB: January 05, 1956 Today's Date: 11/06/2020    History of Present Illness 65 y.o. 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 Patient presented secondary to abdominal pain and found to have evidence of sepsis possibly secondary to necrotic bladder mass. Empiric antibiotics initiated. Urology and oncology consulted.   Clinical Impression   PTA, pt lived in a home with 5 guys. Pt reports he was using a cane for functional mobility but had not been out of bed in a couple of days. Limited home information and PLOF due to pt distractibility. Pt received in bed with RN, pt with c/o itching all over. Therapist introduced herself and explained role of OT. Pt stated "I can take care of myself, I am moving in the bed and will get stronger. I don't need to get out of bed, then they will discharge me". Pt reluctantly agreeable to therapy after education on importance of getting out of bed to maintain strength. Pt required extensive time and max cues for motivation, redirection and initiation to progress to EOB. He required maxA to don socks, appeared to be due to self-limiting behavior. Pt required maxA to stand from EOB x2, he took 3 side steps to reposition higher in bed, requiring maxA for stability. Pt's HR 99bpm at rest, up to 119bpm with mobility. ue to decline in current level of function, pt would benefit from acute OT to address established goals to facilitate safe D/C to venue listed below. At this time, recommend SNF follow-up. Will continue to follow acutely.   Follow Up Recommendations  SNF;Supervision/Assistance - 24 hour    Equipment Recommendations  3 in 1 bedside commode    Recommendations for Other Services PT consult     Precautions / Restrictions Precautions Precautions: Fall Precaution Comments: watch  HR Restrictions Weight Bearing Restrictions: No      Mobility Bed Mobility Overal bed mobility: Needs Assistance Bed Mobility: Supine to Sit;Sit to Supine     Supine to sit: Min guard;HOB elevated Sit to supine: Min guard;HOB elevated   General bed mobility comments: minguard for safety and max cues for motivation and progression to EOB    Transfers Overall transfer level: Needs assistance Equipment used: 1 person hand held assist Transfers: Sit to/from Stand Sit to Stand: Max assist         General transfer comment: maxA to powerup into standing x2 from EOB;maxA to take 3 side steps to reposition higher in the bed    Balance Overall balance assessment: Needs assistance Sitting-balance support: No upper extremity supported;Feet supported Sitting balance-Leahy Scale: Good     Standing balance support: Bilateral upper extremity supported;During functional activity Standing balance-Leahy Scale: Poor Standing balance comment: requires maxA for stability in standing                           ADL either performed or assessed with clinical judgement   ADL Overall ADL's : Needs assistance/impaired Eating/Feeding: Set up;Sitting   Grooming: Set up;Sitting   Upper Body Bathing: Set up;Sitting   Lower Body Bathing: Maximal assistance;Sit to/from stand   Upper Body Dressing : Set up;Sitting   Lower Body Dressing: Maximal assistance;Sit to/from stand Lower Body Dressing Details (indicate cue type and reason): pt with difficulty donning socks secondary to long toenails and heel pads;pt able to [employer] Details (indicate  cue type and reason): deferred Toileting- Clothing Manipulation and Hygiene: Maximal assistance;Sit to/from stand       Functional mobility during ADLs: Maximal assistance General ADL Comments: pt easily distracted during session, required max cues to redirect cues to initiate;pt limited by self limiting behaviors;he sat EOB  for 5 min and reluctantly agreeable to attempt x2 sit<>stand from EOB requiring maxA     Vision         Perception     Praxis      Pertinent Vitals/Pain Pain Assessment: Faces Faces Pain Scale: Hurts a little bit Pain Location: generalized;left foot Pain Descriptors / Indicators: Grimacing;Guarding Pain Intervention(s): Limited activity within patient's tolerance;Monitored during session     Hand Dominance Right   Extremity/Trunk Assessment Upper Extremity Assessment Upper Extremity Assessment: Generalized weakness   Lower Extremity Assessment Lower Extremity Assessment: Generalized weakness   Cervical / Trunk Assessment Cervical / Trunk Assessment: Kyphotic   Communication Communication Communication: No difficulties   Cognition Arousal/Alertness: Awake/alert Behavior During Therapy: Restless Overall Cognitive Status: No family/caregiver present to determine baseline cognitive functioning                                 General Comments: pt wtih tangential throughts and easily distracted by environment and his own thoughts. difficult to interject and refocus pt as he would talk over therapist;pt required max cues to engage during session and max assist for redirection;anticipate this is pt's baseline   General Comments  Hr 99bpm at rest, up to 119bpm with standing    Exercises Exercises: General Lower Extremity General Exercises - Lower Extremity Quad Sets: Both;5 reps;Supine Heel Slides: Both;5 reps;Supine Straight Leg Raises: Both;5 reps;Supine   Shoulder Instructions      Home Living Family/patient expects to be discharged to:: Other (Comment) (boarding house)                                 Additional Comments: Pt reported he lives in a boarding house with 5 guys      Prior Functioning/Environment Level of Independence: Independent        Comments: reports he wasn't getting out of bed much;difficult to gather pt's prior  level of functioning as he was easily distracted and with tangential thoughts talking about his family and prison life        OT Problem List: Decreased strength;Decreased range of motion;Decreased activity tolerance;Impaired balance (sitting and/or standing);Decreased safety awareness;Decreased knowledge of use of DME or AE;Decreased knowledge of precautions;Impaired UE functional use;Pain      OT Treatment/Interventions: Self-care/ADL training;Therapeutic exercise;Energy conservation;DME and/or AE instruction;Therapeutic activities;Patient/family education;Balance training    OT Goals(Current goals can be found in the care plan section) Acute Rehab OT Goals Patient Stated Goal: to stop itching OT Goal Formulation: With patient Time For Goal Achievement: 11/20/20 Potential to Achieve Goals: Fair ADL Goals Pt Will Perform Grooming: standing;with min guard assist Pt Will Perform Lower Body Dressing: with min guard assist;sit to/from stand Pt Will Transfer to Toilet: with min guard assist;stand pivot transfer Additional ADL Goal #1: Pt will demonstrate independence with 3 fall prevention strategies during ADL.  OT Frequency: Min 1X/week   Barriers to D/C:            Co-evaluation              AM-PAC OT "6 Clicks" Daily Activity  Outcome Measure Help from another person eating meals?: None Help from another person taking care of personal grooming?: A Little Help from another person toileting, which includes using toliet, bedpan, or urinal?: A Lot Help from another person bathing (including washing, rinsing, drying)?: A Lot Help from another person to put on and taking off regular upper body clothing?: A Little Help from another person to put on and taking off regular lower body clothing?: A Lot 6 Click Score: 16   End of Session Nurse Communication: Mobility status;Need for lift equipment  Activity Tolerance: Patient tolerated treatment well Patient left: in bed;with  call bell/phone within reach;with bed alarm set  OT Visit Diagnosis: Unsteadiness on feet (R26.81);Other abnormalities of gait and mobility (R26.89);Muscle weakness (generalized) (M62.81);Other symptoms and signs involving cognitive function;Pain Pain - Right/Left: Left Pain - part of body:  (heel/foot)                Time: [id number] OT Time Calculation (min): 38 min Charges:  OT General Charges $OT Visit: 1 Visit OT Evaluation $OT Eval Moderate Complexity: 1 Mod OT Treatments $Self Care/Home Management : 8-22 mins $Therapeutic Exercise: 8-22 mins  Helene Kelp OTR/L Acute Rehabilitation Services Office: [phone]   Wyn Forster 11/06/2020, 9:56 AM

## 2020-11-06 NOTE — Plan of Care (Signed)

## 2020-11-06 NOTE — Evaluation (Signed)
Physical Therapy Evaluation Patient Details Name: Jamie Wilson MRN: [id number] DOB: [birth year] Today's Date: 11/06/2020   History of Present Illness  65 y.o. 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 Patient presented secondary to abdominal pain and found to have evidence of sepsis possibly secondary to necrotic bladder mass. Empiric antibiotics initiated. Urology and oncology consulted.  Clinical Impression  Pt admitted with above diagnosis.  Pt currently with functional limitations due to the deficits listed below (see PT Problem List). Pt will benefit from skilled PT to increase their independence and safety with mobility to allow discharge to the venue listed below.   Pt is known to me from previous admissions. He refused mobility because he stood with OT earlier this morning. He states he has "gout" in his feet and is "not doing any damn walking". Pt is agreeable to bed level activity only. Recommend SNF post acute      Follow Up Recommendations SNF    Equipment Recommendations  None recommended by PT    Recommendations for Other Services       Precautions / Restrictions Precautions Precautions: Fall Restrictions Weight Bearing Restrictions: No      Mobility  Bed Mobility               General bed mobility comments: refused because he got to EOB and stood with OT earlier. pt does admit he needed assist to stand    Transfers                 General transfer comment: refused  Ambulation/Gait                Stairs            Wheelchair Mobility    Modified Rankin (Stroke Patients Only)       Balance                                             Pertinent Vitals/Pain Pain Assessment: Faces Faces Pain Scale: Hurts a little bit Pain Location: feet (pt states it is gout) Pain Descriptors / Indicators: Discomfort Pain  Intervention(s): Limited activity within patient's tolerance;Monitored during session    Home Living Family/patient expects to be discharged to:: Other (Comment)                 Additional Comments: Pt reported he lives in a boarding house with 5 guys    Prior Function Level of Independence: Independent         Comments: reports he wasn't getting out of bed much;difficult to gather pt's prior level of functioning as he was easily distracted and with tangential thoughts talking about his family and prison life     Hand Dominance        Extremity/Trunk Assessment   Upper Extremity Assessment Upper Extremity Assessment: Generalized weakness    Lower Extremity Assessment Lower Extremity Assessment: Generalized weakness (moves actively through full ROM bil LEs)       Communication   Communication: No difficulties  Cognition Arousal/Alertness: Awake/alert Behavior During Therapy: Flat affect Overall Cognitive Status: No family/caregiver present to determine baseline cognitive functioning  General Comments: pt wtih tangential throughts and easily distracted by environment and his own thoughts. pt required max cues to engage with PT.anticipate this is pt's baseline. pt states to bring a gun next time so he can end things and some "C4 for the nasty house" he lived in. pt is familiar to me from previous admission and these statements are also his baseline      General Comments      Exercises     Assessment/Plan    PT Assessment Patient needs continued PT services  PT Problem List Decreased strength;Decreased activity tolerance;Decreased balance;Pain;Decreased mobility       PT Treatment Interventions DME instruction;Gait training;Functional mobility training;Therapeutic activities;Therapeutic exercise;Patient/family education;Balance training    PT Goals (Current goals can be found in the Care Plan section)  Acute  Rehab PT Goals Patient Stated Goal: to stop itching PT Goal Formulation: With patient Time For Goal Achievement: 11/20/20 Potential to Achieve Goals: Fair    Frequency Min 2X/week   Barriers to discharge        Co-evaluation               AM-PAC PT "6 Clicks" Mobility  Outcome Measure Help needed turning from your back to your side while in a flat bed without using bedrails?: A Little Help needed moving from lying on your back to sitting on the side of a flat bed without using bedrails?: A Little Help needed moving to and from a bed to a chair (including a wheelchair)?: A Lot Help needed standing up from a chair using your arms (e.g., wheelchair or bedside chair)?: A Lot Help needed to walk in hospital room?: Total Help needed climbing 3-5 steps with a railing? : Total 6 Click Score: 12    End of Session   Activity Tolerance: Other (comment) (self limiting, refused mobility) Patient left: in bed;with call bell/phone within reach;with bed alarm set Nurse Communication: Mobility status PT Visit Diagnosis: Other abnormalities of gait and mobility (R26.89);Muscle weakness (generalized) (M62.81)    Time: 5701-7793 PT Time Calculation (min) (ACUTE ONLY): 18 min   Charges:   PT Evaluation $PT Eval Low Complexity: [place], PT  Acute Rehab Dept ([place]) [phone] Pager [phone]  11/06/2020   [facility] 11/06/2020, 1:24 PM

## 2020-11-06 NOTE — Progress Notes (Signed)
PROGRESS NOTE    Jamie Wilson  [id number] DOB: Aug 24, 1956 DOA: 11/02/2020 PCP: Patient, [name]   Brief Narrative: Jamie Wilson is a 64 y.o. 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 Patient presented secondary to abdominal pain and found to have evidence of sepsis possibly secondary to necrotic bladder mass. Empiric antibiotics initiated. Urology and oncology consulted.   Assessment & Plan:   Active Problems:   Malnutrition ([place])   Sepsis ([place])   Lower urinary tract infectious disease   Palliative care by specialist   Goals of care, counseling/discussion   Sepsis Present on admission. Possible urinary source. CT abd/pelvis significant for a necrotic bladder mass which may be contributing as well. Started on Vancomycin and Zosyn on admission. Blood (1 set)/urine cultures obtained on admission. Urine culture with multiple species. Repeat culture is similar. Blood culture with no growth to date. Patient developed a rash, possibly related to Cefdinir; no reaction to Zosyn. 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 Urology consulted. Consideration of TURBT for debulking. Foley replaced by urology with purulent drainage noted. -Urology recommendations -Medical oncology recommendations: poor candidate for neoadjuvant chemotherapy with recommendation to consider cystectomy prior to adjuvant chemo/immunotherapy vs radiation oncology for concurrent chemo/radiation therapy  Underweight Severe malnutrition Concern for malnutrition -Dietitian recommendations:   INTERVENTION:   -Ensure Enlive po TID, each supplement provides 350 kcal and 20  grams of protein  -Prosource Plus PO BID, each provides 100 kcals and 15g protein  Possible UTI In setting of necrotic mass. As mentioned above.  Bilateral toe pain Appears to be possible pressure  injury. Does not appear infected but is tender. No osteomyelitis on x-ray -WOC consult  Chronic anemia Severely low hemoglobin appears to be erroneous. Hemoglobin of around 10-11 which is baseline.  Hypoglycemia -Discontinue D10 IV fluids -CBG daily  Decreased bowel movements In setting of opiates -Miralax  Coagulopathy Initial INR of 1.6 which has improved to 1.1 and is at baseline   DVT prophylaxis: SCDs Code Status:   Code Status: Full Code Family Communication: None at bedside Disposition Plan: Discharge SNF pending Urology recommendations. Medically stable for discharge.   Consultants:   Urology  Medical oncology  Procedures:   None  Antimicrobials:  Vancomycin  Zosyn   Cefdinir  Ciprofloxacin   Subjective: Itching overnight.  Objective: Vitals:   11/05/20 0444 11/05/20 1541 11/05/20 2036 11/06/20 0429  BP: 138/74 108/63 118/72 124/71  Pulse: 92 79 93 (!) 101  Resp: 16 20 18 18   Temp: 97.9 F (36.6 C) 97.7 F (36.5 C) 98.7 F (37.1 C) 99.6 F (37.6 C)  TempSrc: Oral Oral Oral Oral  SpO2: 94% 97% 97% 98%  Weight:      Height:        Intake/Output Summary (Last 24 hours) at 11/06/2020 1117 Last data filed at 11/06/2020 0436 Gross per 24 hour  Intake 1064.04 ml  Output 2700 ml  Net -1635.96 ml   Filed Weights   11/03/20 0852  Weight: 50.7 kg    Examination:  General exam: Appears calm and comfortable  Respiratory system: Clear to auscultation. Respiratory effort normal. Cardiovascular system: S1 & S2 heard, RRR. No murmurs, rubs, gallops or clicks. Gastrointestinal system: Abdomen is nondistended, soft and nontender. No organomegaly or masses felt. Decreased bowel sounds heard. Central nervous system: Alert and oriented. No focal neurological deficits. Musculoskeletal: No edema. No calf tenderness Skin: No  cyanosis. Dry skin. Psychiatry: Judgement and insight appear normal. Mood & affect appropriate.      Data Reviewed: I have  personally reviewed following labs and imaging studies  CBC Lab Results  Component Value Date   WBC 14.8 (H) 11/06/2020   RBC 3.69 (L) 11/06/2020   HGB 10.5 (L) 11/06/2020   HCT 33.4 (L) 11/06/2020   MCV 90.5 11/06/2020   MCH 28.5 11/06/2020   PLT 305 11/06/2020   MCHC 31.4 11/06/2020   RDW 15.3 11/06/2020   LYMPHSABS 1.3 11/04/2020   MONOABS 0.7 11/04/2020   EOSABS 1.9 (H) 11/04/2020   BASOSABS 0.1 60/07/9322     Last metabolic panel Lab Results  Component Value Date   NA 138 11/05/2020   K 3.6 11/05/2020   CL 111 11/05/2020   CO2 20 (L) 11/05/2020   BUN 14 11/05/2020   CREATININE 0.77 11/06/2020   GLUCOSE 93 11/05/2020   GFRNONAA >60 11/06/2020   GFRAA >60 11/21/2019   CALCIUM 11.1 (H) 11/05/2020   PHOS 1.7 (L) 10/05/2020   PROT 5.6 (L) 11/04/2020   ALBUMIN 2.0 (L) 11/04/2020   BILITOT 0.3 11/04/2020   ALKPHOS 70 11/04/2020   AST 14 (L) 11/04/2020   ALT 10 11/04/2020   ANIONGAP 7 11/05/2020    CBG (last 3)  Recent Labs    11/05/20 1741 11/05/20 2332 11/06/20 0431  GLUCAP 108* 123* 111*     GFR: Estimated Creatinine Clearance: 66 mL/min (by C-G formula based on SCr of 0.77 mg/dL).  Coagulation Profile: Recent Labs  Lab 11/03/20 0230 11/04/20 0740  INR 1.6* 1.1    Recent Results (from the past 240 hour(s))  Blood culture (routine single)     Status: None (Preliminary result)   Collection Time: 11/03/20  2:53 AM   Specimen: BLOOD LEFT WRIST  Result Value Ref Range Status   Specimen Description   Final    BLOOD LEFT WRIST Performed at [facility] [address] [place], [place] [address]    Special Requests   Final    BOTTLES DRAWN AEROBIC AND ANAEROBIC Blood Culture results may not be optimal due to an inadequate volume of blood received in culture bottles Performed at [facility] [address], [place], [place] [address]    Culture   Final    NO GROWTH 2 DAYS Performed at [facility] [address] [place], [place] [address]    Report Status PENDING  Incomplete  Urine culture     Status: Abnormal   Collection Time: 11/03/20  3:30 AM   Specimen: In/Out Cath Urine  Result Value Ref Range Status   Specimen Description   Final    IN/OUT CATH URINE Performed at [facility] [address], [place], [place] [address]    Special Requests   Final    NONE Performed at [facility], [place] [address] [place],  [address]    Culture MULTIPLE SPECIES PRESENT, SUGGEST RECOLLECTION (A)  Final   Report Status 11/04/2020 FINAL  Final  Resp Panel by RT-PCR (Flu A&B, Covid) Nasopharyngeal Swab     Status: None   Collection Time: 11/03/20 10:20 AM   Specimen: Nasopharyngeal Swab; Nasopharyngeal(NP) swabs in vial transport medium  Result Value Ref Range Status   SARS Coronavirus 2 by RT PCR NEGATIVE NEGATIVE Final    Comment: (NOTE) SARS-CoV-2 target nucleic acids are NOT DETECTED.  The SARS-CoV-2 RNA is generally detectable in upper respiratory specimens  during the acute phase of infection. The lowest concentration of SARS-CoV-2 viral copies this assay can detect is 138 copies/mL. A negative result does not preclude SARS-Cov-2 infection and should not be used as the sole basis for treatment or other patient management decisions. A negative result may occur with  improper specimen collection/handling, submission of specimen other than nasopharyngeal swab, presence of viral mutation(s) within the areas targeted by this assay, and inadequate number of viral copies(<138 copies/mL). A negative result must be combined with clinical observations, patient history, and epidemiological information. The expected result is Negative.  Fact Sheet for Patients:  EntrepreneurPulse.com.au  Fact Sheet for Healthcare Providers:  IncredibleEmployment.be  This test is no t yet approved or cleared by the [place] FDA and  has  been authorized for detection and/or diagnosis of SARS-CoV-2 by FDA under an Emergency Use Authorization (EUA). This EUA will remain  in effect (meaning this test can be used) for the duration of the COVID-19 declaration under Section 564(b)(1) of the Act, 21 U.S.C.section 360bbb-3(b)(1), unless the authorization is terminated  or revoked sooner.       Influenza A by PCR NEGATIVE NEGATIVE Final   Influenza B by PCR NEGATIVE NEGATIVE Final    Comment: (NOTE) The Xpert Xpress SARS-CoV-2/FLU/RSV plus assay is intended as an aid in the diagnosis of influenza from Nasopharyngeal swab specimens and should not be used as a sole basis for treatment. Nasal washings and aspirates are unacceptable for Xpert Xpress SARS-CoV-2/FLU/RSV testing.  Fact Sheet for Patients: EntrepreneurPulse.com.au  Fact Sheet for Healthcare Providers: IncredibleEmployment.be  This test is not yet approved or cleared by the [place] FDA and has been authorized for detection and/or diagnosis of SARS-CoV-2 by FDA under an Emergency Use Authorization (EUA). This EUA will remain in effect (meaning this test can be used) for the duration of the COVID-19 declaration under Section 564(b)(1) of the Act, 21 U.S.C. section 360bbb-3(b)(1), unless the authorization is terminated or revoked.  Performed at [facility], [place] [address], [place], [place] [address]   Culture, Urine     Status: Abnormal   Collection Time: 11/04/20 11:43 AM   Specimen: Urine, Catheterized  Result Value Ref Range Status   Specimen Description   Final    URINE, CATHETERIZED Performed at [facility] [address], [place], [place] [address]    Special Requests   Final    NONE Performed at [facility], [place] [address] [place], [place] [address]    Culture MULTIPLE SPECIES PRESENT, SUGGEST RECOLLECTION (A)  Final   Report Status 11/05/2020  FINAL  Final        Radiology Studies: DG Foot 2 Views Left  Result Date: 11/05/2020 CLINICAL DATA:  Pain.  No injury.  Possible gout. EXAM: LEFT FOOT - 2 VIEW COMPARISON:  None. FINDINGS: There is no evidence of fracture or dislocation. There is no evidence of arthropathy or other focal bone abnormality. Soft tissues are unremarkable. IMPRESSION: Negative. Electronically Signed   By: Dorise Bullion III M.D   On: 11/05/2020 10:36   DG Foot 2 Views Right  Result Date: 11/05/2020 CLINICAL DATA:  Pain.  No trauma. EXAM: RIGHT FOOT - 2 VIEW COMPARISON:  None. FINDINGS: There is no evidence of fracture or dislocation. There is no evidence of arthropathy or other focal bone abnormality. Soft tissues are unremarkable. IMPRESSION: Negative. Electronically Signed   By: Dorise Bullion III M.D   On: 11/05/2020 10:37  Scheduled Meds: . (feeding supplement) PROSource Plus  30 mL Oral BID WC  . B-complex with vitamin C  1 tablet Oral Daily  . Chlorhexidine Gluconate Cloth  6 each Topical Daily  . ciprofloxacin  500 mg Oral BID  . feeding supplement  237 mL Oral TID BM  . folic acid  1 mg Oral Daily  . hydrocerin   Topical BID  . nicotine  14 mg Transdermal Daily  . sodium chloride flush  10-40 mL Intracatheter Q12H  . sodium chloride flush  10-40 mL Intracatheter Q12H  . cyanocobalamin  1,000 mcg Oral Daily   Continuous Infusions: . sodium chloride       LOS: 3 days     Cordelia Poche, MD [facility] 11/06/2020, 11:17 AM  If 7PM-7AM, please contact night-coverage [email or web address]

## 2020-11-06 NOTE — Progress Notes (Signed)
  Subjective: Pain controlled. Foley draining clearer urine. No other complaints  Objective: Vital signs in last 24 hours: Temp:  [98.7 F (37.1 C)-99.6 F (37.6 C)] 99.6 F (37.6 C) (01/09 0429) Pulse Rate:  [93-101] 101 (01/09 0429) Resp:  [18] 18 (01/09 0429) BP: (118-124)/(71-72) 124/71 (01/09 0429) SpO2:  [97 %-98 %] 98 % (01/09 0429)  Intake/Output from previous day: 01/08 0701 - 01/09 0700 In: 1544 [P.O.:1400; I.V.:144] Out: 3400 [Urine:3400] Intake/Output this shift: Total I/O In: -  Out: 850 [Urine:850]   UOP: not recorded in ED, purulent urine with debris  Physical Exam:  General: Alert and oriented CV: RRR Lungs: Clear Abdomen: Soft, ND, NT; ostomy in place with gas and stool in bag Ext: NT, No erythema  Lab Results: Recent Labs    11/04/20 0740 11/05/20 0925 11/06/20 0500  HGB 10.6* 11.0* 10.5*  HCT 32.6* 35.7* 33.4*   BMET Recent Labs    11/04/20 0740 11/05/20 0925 11/06/20 0500  NA 136 138  --   K 2.8* 3.6  --   CL 108 111  --   CO2 22 20*  --   GLUCOSE 172* 93  --   BUN 13 14  --   CREATININE 0.86 0.84 0.77  CALCIUM 10.7* 11.1*  --      Studies/Results: DG Foot 2 Views Left  Result Date: 11/05/2020 CLINICAL DATA:  Pain.  No injury.  Possible gout. EXAM: LEFT FOOT - 2 VIEW COMPARISON:  None. FINDINGS: There is no evidence of fracture or dislocation. There is no evidence of arthropathy or other focal bone abnormality. Soft tissues are unremarkable. IMPRESSION: Negative. Electronically Signed   By: Dorise Bullion III M.D   On: 11/05/2020 10:36   DG Foot 2 Views Right  Result Date: 11/05/2020 CLINICAL DATA:  Pain.  No trauma. EXAM: RIGHT FOOT - 2 VIEW COMPARISON:  None. FINDINGS: There is no evidence of fracture or dislocation. There is no evidence of arthropathy or other focal bone abnormality. Soft tissues are unremarkable. IMPRESSION: Negative. Electronically Signed   By: Dorise Bullion III M.D   On: 11/05/2020 10:37     Assessment/Plan: 1. 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  S/p TURBT large with Dr. Jeffie Pollock on 10/06/2020.  At the time, he was found to have a very large necrotic bladder tumor at the dome, anterior and posterior walls of the bladder.  CT A/P 11/03/2020 with progression of bladder mass associated with areas of necrosis and intraluminal air.  Potentially metastatic pelvic lymph nodes which are enhancing measuring up to 8 mm. 2.  UTI due to necrotic, infected large bladder mass 3.  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  However, this may be related to necrotic tumor with underlying infection.  No identification of colovesicular fistula during TURBT on 10/06/2020 with Dr. Jeffie Pollock. 4.  Bilateral nephrolithiasis: CT A/P 11/03/2020 with bilateral nonobstructing stones.  No evidence of ureteral stone.  Of note, prior CT A/P 10/02/2020 revealed 3 mm proximal right ureteral stone which is now no longer present.  -foley draining well.  -Manually irrigate his bladder as needed for decreased drainage, obstructive debris or clots. -No sign of gross hematuria presently. -patient should receive 2 weeks of culture specific antibiotics. Patient will be scheduled electively for his radical cystectomy  LOS: 3 days   Nicolette Bang

## 2020-11-07 DIAGNOSIS — N3001 Acute cystitis with hematuria: Secondary | ICD-10-CM | POA: Diagnosis not present

## 2020-11-07 DIAGNOSIS — E46 Unspecified protein-calorie malnutrition: Secondary | ICD-10-CM | POA: Diagnosis not present

## 2020-11-07 DIAGNOSIS — A419 Sepsis, unspecified organism: Secondary | ICD-10-CM | POA: Diagnosis not present

## 2020-11-07 LAB — CREATININE, SERUM
Creatinine, Ser: 1.01 mg/dL (ref 0.61–1.24)
GFR, Estimated: >60 mL/min (ref >60)

## 2020-11-07 LAB — GLUCOSE, CAPILLARY: Glucose-Capillary: 110 mg/dL — ABNORMAL HIGH (ref 70–99)

## 2020-11-07 NOTE — Progress Notes (Signed)
  Subjective: Pain controlled. No nausea or emesis.  Objective: Vital signs in last 24 hours: Temp:  [97.9 F (36.6 C)-98 F (36.7 C)] 98 F (36.7 C) (01/10 7989) Pulse Rate:  [91-99] 91 (01/10 0613) Resp:  [18] 18 (01/10 0613) BP: (107-109)/(75-77) 109/77 (01/10 2119) SpO2:  [95 %-97 %] 95 % (01/10 0613)  Intake/Output from previous day: 01/09 0701 - 01/10 0700 In: 240 [P.O.:240] Out: 4174 [Urine:4125] Intake/Output this shift: No intake/output data recorded.  Physical Exam:  General: Alert and oriented CV: RRR Lungs: Clear Abdomen: Soft, ND, NT Ext: NT, No erythema Foley: clear yellow  Lab Results: Recent Labs    11/05/20 0925 11/06/20 0500  HGB 11.0* 10.5*  HCT 35.7* 33.4*   BMET Recent Labs    11/05/20 0925 11/06/20 0500 11/07/20 0454  NA 138  --   --   K 3.6  --   --   CL 111  --   --   CO2 20*  --   --   GLUCOSE 93  --   --   BUN 14  --   --   CREATININE 0.84 0.77 1.01  CALCIUM 11.1*  --   --      Studies/Results: No results found.  Assessment/Plan: 1. 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 Jeffie Pollock on 10/06/2020. At the time, he was found to have a very large necrotic bladder tumor at the dome, anterior and posterior walls of the bladder. CT A/P 11/03/2020 with progression of bladder mass associated with areas of necrosis and intraluminal air. Potentially metastatic pelvic lymph nodes which are enhancing measuring up to 8 mm. 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 However, this may be related to necrotic tumor with underlying infection. No identification of colovesicular fistula during TURBT on 10/06/2020 with Dr. Jeffie Pollock. 4.Bilateral nephrolithiasis: CT A/P 11/03/2020 with bilateral nonobstructing stones. No evidence  of ureteral stone. Of note, prior CT A/P 10/02/2020 revealed 3 mm proximal right ureteral stone which is now no longer present.  -Foley to gravity. -Manually irrigate his bladder as needed for decreased drainage, obstructive debris or clots. -No sign of gross hematuria presently. -Debris is clearing -UCx 1/6 and 1/7 with multiple species present. -Continue 2 weeks of abx -F/u in office to consider RC/IC. Messaged schedulers to arrange.   LOS: 4 days   Matt R. Ellasyn Swilling MD 11/07/2020, 10:37 AM [facility]  Pager: [phone]

## 2020-11-07 NOTE — Consult Note (Addendum)
WOC Nurse Consult Note: Reason for Consult:Stage 2 pressure injury to bilateral great toes on tip of toe.  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  No edema or erythema noted.   Wound type: pressure from ill fitting shoes, most likely Pressure Injury POA: Yes Measurement:0.2 cm round on both distal tips of great toes.  Wound OVZ:CHYI and moist Drainage (amount, consistency, odor) scant weeping Periwound:intact  Mycotic toenails and hammer toes.  Dry skin Dressing procedure/placement/frequency: No pressure (shoes)  Keep feet clean, dry and moisturized.  Dry dressing if patient desires.   [place] Nurse ostomy consult note Stoma type/location: LLQ colostomy, located within abdominal hernia Wears 2 piece appliance with barrier ring. Independent in car.e  Stomal assessment/size: 1"  Pink and moist Peristomal assessment: hernia present Treatment options for stomal/peristomal skin: barrier ring and 2 piece pouch Output scant brown stool. Patient states he had stool in ED upon arrival and does not feel constipated.  Ostomy pouching: 2pc. 2 1/4" pouch with barrier ring  Education provided: none needed Enrolled patient in [place] program: Yes previously  Will not follow at this time.  Please re-consult if needed.  Domenic Moras MSN, RN, FNP-BC CWON Wound, Ostomy, Continence Nurse Pager [phone]

## 2020-11-07 NOTE — Progress Notes (Signed)
PROGRESS NOTE    Skipper Dacosta  YQM:[id number] DOB: May 10, 1956 DOA: 11/02/2020 PCP: Patient, [name]   Brief Narrative: Sebron Mcmahill is a 65 y.o. 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 Patient presented secondary to abdominal pain and found to have evidence of sepsis possibly secondary to necrotic bladder mass. Empiric antibiotics initiated. Urology and oncology consulted.   Assessment & Plan:   Active Problems:   Malnutrition ([place])   Sepsis ([place])   Protein-calorie malnutrition, severe   Lower urinary tract infectious disease   Palliative care by specialist   Goals of care, counseling/discussion   Sepsis Present on admission. Possible urinary source. CT abd/pelvis significant for a necrotic bladder mass which may be contributing as well. Started on Vancomycin and Zosyn on admission. Blood (1 set)/urine cultures obtained on admission. Urine culture with multiple species. Repeat culture is similar. Blood culture with no growth to date. Patient developed a rash, possibly related to Cefdinir; no reaction to Zosyn. 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 Urology consulted. Consideration of TURBT for debulking. Foley replaced by urology with purulent drainage noted. -Urology recommendations -Medical oncology recommendations: poor candidate for neoadjuvant chemotherapy with recommendation to consider cystectomy prior to adjuvant chemo/immunotherapy vs radiation oncology for concurrent chemo/radiation therapy  Underweight Severe malnutrition Concern for malnutrition -Dietitian recommendations:   INTERVENTION:   -Ensure Enlive po TID, each supplement provides 350 kcal and 20  grams of protein  -Prosource Plus PO BID, each provides 100 kcals and 15g protein  Possible UTI In setting of necrotic mass. As  mentioned above.  Bilateral toe pain Appears to be possible pressure injury. Does not appear infected but is tender. No osteomyelitis on x-ray -WOC consult pending  Chronic anemia Severely low hemoglobin appears to be erroneous. Hemoglobin of around 10-11 which is baseline.  Hypoglycemia -CBG daily  Decreased bowel movements In setting of opiates -Miralax  Coagulopathy Initial INR of 1.6 which has improved to 1.1 and is at baseline   DVT prophylaxis: SCDs Code Status:   Code Status: Full Code Family Communication: None at bedside Disposition Plan: Discharge SNF pending Urology recommendations with regard to bladder irrigation. Medically stable for discharge otherwise.   Consultants:   Urology  Medical oncology  Procedures:   None  Antimicrobials:  Vancomycin  Zosyn   Cefdinir  Ciprofloxacin   Subjective: Bladder irrigation yields a lot of debris. No other issues.  Objective: Vitals:   11/05/20 2036 11/06/20 0429 11/06/20 2246 11/07/20 0613  BP: 118/72 124/71 107/75 109/77  Pulse: 93 (!) 101 99 91  Resp: 18 18 18 18   Temp: 98.7 F (37.1 C) 99.6 F (37.6 C) 97.9 F (36.6 C) 98 F (36.7 C)  TempSrc: Oral Oral Oral Oral  SpO2: 97% 98% 97% 95%  Weight:      Height:        Intake/Output Summary (Last 24 hours) at 11/07/2020 1014 Last data filed at 11/07/2020 0600 Gross per 24 hour  Intake 240 ml  Output 4125 ml  Net -3885 ml   Filed Weights   11/03/20 0852  Weight: 50.7 kg    Examination:  General exam: Appears calm and comfortable Respiratory system: Clear to auscultation. Respiratory effort normal. Cardiovascular system: S1 & S2 heard, RRR. No murmurs, rubs, gallops or clicks. Gastrointestinal system: Abdomen is nondistended, soft and nontender. No organomegaly or masses felt. Normal bowel sounds heard. Central  nervous system: Alert and oriented. No focal neurological deficits. Musculoskeletal: No edema. No calf tenderness Skin: No  cyanosis. Psychiatry: Judgement and insight appear normal. Mood & affect appropriate.     Data Reviewed: I have personally reviewed following labs and imaging studies  CBC Lab Results  Component Value Date   WBC 14.8 (H) 11/06/2020   RBC 3.69 (L) 11/06/2020   HGB 10.5 (L) 11/06/2020   HCT 33.4 (L) 11/06/2020   MCV 90.5 11/06/2020   MCH 28.5 11/06/2020   PLT 305 11/06/2020   MCHC 31.4 11/06/2020   RDW 15.3 11/06/2020   LYMPHSABS 1.3 11/04/2020   MONOABS 0.7 11/04/2020   EOSABS 1.9 (H) 11/04/2020   BASOSABS 0.1 16/07/9603     Last metabolic panel Lab Results  Component Value Date   NA 138 11/05/2020   K 3.6 11/05/2020   CL 111 11/05/2020   CO2 20 (L) 11/05/2020   BUN 14 11/05/2020   CREATININE 1.01 11/07/2020   GLUCOSE 93 11/05/2020   GFRNONAA >60 11/07/2020   GFRAA >60 11/21/2019   CALCIUM 11.1 (H) 11/05/2020   PHOS 1.7 (L) 10/05/2020   PROT 5.6 (L) 11/04/2020   ALBUMIN 2.0 (L) 11/04/2020   BILITOT 0.3 11/04/2020   ALKPHOS 70 11/04/2020   AST 14 (L) 11/04/2020   ALT 10 11/04/2020   ANIONGAP 7 11/05/2020    CBG (last 3)  Recent Labs    11/06/20 0431 11/06/20 1204 11/07/20 0610  GLUCAP 111* 100* 110*     GFR: Estimated Creatinine Clearance: 52.3 mL/min (by C-G formula based on SCr of 1.01 mg/dL).  Coagulation Profile: Recent Labs  Lab 11/03/20 0230 11/04/20 0740  INR 1.6* 1.1    Recent Results (from the past 240 hour(s))  Blood culture (routine single)     Status: None (Preliminary result)   Collection Time: 11/03/20  2:53 AM   Specimen: BLOOD LEFT WRIST  Result Value Ref Range Status   Specimen Description   Final    BLOOD LEFT WRIST Performed at [facility] [address] [place], [place] [address]    Special Requests   Final    BOTTLES DRAWN AEROBIC AND ANAEROBIC Blood Culture results may not be optimal due to an inadequate volume of blood received in culture bottles Performed at [facility] [address] [place], [place] [address]    Culture   Final    NO GROWTH 4 DAYS Performed at [facility], [place] [address], [place], [place] [address]    Report Status PENDING  Incomplete  Urine culture     Status: Abnormal   Collection Time: 11/03/20  3:30 AM   Specimen: In/Out Cath Urine  Result Value Ref Range Status   Specimen Description   Final    IN/OUT CATH URINE Performed at [facility] [address], [place], [place] [address]    Special Requests   Final    NONE Performed at [facility], [place] [address] [place], [place] [address]    Culture MULTIPLE SPECIES PRESENT, SUGGEST RECOLLECTION (A)  Final   Report Status 11/04/2020 FINAL  Final  Resp Panel by RT-PCR (Flu A&B, Covid) Nasopharyngeal Swab     Status: None   Collection Time: 11/03/20 10:20 AM   Specimen: Nasopharyngeal Swab; Nasopharyngeal(NP) swabs in vial transport medium  Result Value Ref Range Status   SARS Coronavirus 2 by RT PCR NEGATIVE NEGATIVE Final    Comment: (NOTE) SARS-CoV-2 target nucleic acids  are NOT DETECTED.  The SARS-CoV-2 RNA is generally detectable in upper respiratory specimens during the acute phase of infection. The lowest concentration of SARS-CoV-2 viral copies this assay can detect is 138 copies/mL. A negative result does not preclude SARS-Cov-2 infection and should not be used as the sole basis for treatment or other patient management decisions. A negative result may occur with  improper specimen collection/handling, submission of specimen other than nasopharyngeal swab, presence of viral mutation(s) within the areas targeted by this assay, and inadequate number of viral copies(<138 copies/mL). A negative result must be combined with clinical observations, patient history, and epidemiological information. The expected result is Negative.  Fact Sheet for Patients:  EntrepreneurPulse.com.au  Fact Sheet for Healthcare Providers:   IncredibleEmployment.be  This test is no t yet approved or cleared by the [place] FDA and  has been authorized for detection and/or diagnosis of SARS-CoV-2 by FDA under an Emergency Use Authorization (EUA). This EUA will remain  in effect (meaning this test can be used) for the duration of the COVID-19 declaration under Section 564(b)(1) of the Act, 21 U.S.C.section 360bbb-3(b)(1), unless the authorization is terminated  or revoked sooner.       Influenza A by PCR NEGATIVE NEGATIVE Final   Influenza B by PCR NEGATIVE NEGATIVE Final    Comment: (NOTE) The Xpert Xpress SARS-CoV-2/FLU/RSV plus assay is intended as an aid in the diagnosis of influenza from Nasopharyngeal swab specimens and should not be used as a sole basis for treatment. Nasal washings and aspirates are unacceptable for Xpert Xpress SARS-CoV-2/FLU/RSV testing.  Fact Sheet for Patients: EntrepreneurPulse.com.au  Fact Sheet for Healthcare Providers: IncredibleEmployment.be  This test is not yet approved or cleared by the [place] FDA and has been authorized for detection and/or diagnosis of SARS-CoV-2 by FDA under an Emergency Use Authorization (EUA). This EUA will remain in effect (meaning this test can be used) for the duration of the COVID-19 declaration under Section 564(b)(1) of the Act, 21 U.S.C. section 360bbb-3(b)(1), unless the authorization is terminated or revoked.  Performed at [facility], [place] [address], [place], [place] [address]   Culture, Urine     Status: Abnormal   Collection Time: 11/04/20 11:43 AM   Specimen: Urine, Catheterized  Result Value Ref Range Status   Specimen Description   Final    URINE, CATHETERIZED Performed at [facility] [address], [place], [place] [address]    Special Requests   Final    NONE Performed at [facility], [place] [address]  [place], [place] [address]    Culture MULTIPLE SPECIES PRESENT, SUGGEST RECOLLECTION (A)  Final   Report Status 11/05/2020 FINAL  Final        Radiology Studies: No results found.      Scheduled Meds: . (feeding supplement) PROSource Plus  30 mL Oral BID WC  . B-complex with vitamin C  1 tablet Oral Daily  . Chlorhexidine Gluconate Cloth  6 each Topical Daily  . ciprofloxacin  500 mg Oral BID  . feeding supplement  237 mL Oral TID BM  . folic acid  1 mg Oral Daily  . hydrocerin   Topical BID  . nicotine  14 mg Transdermal Daily  . polyethylene glycol  17 g Oral Daily  . sodium chloride flush  10-40 mL Intracatheter Q12H  . sodium chloride flush  10-40 mL Intracatheter Q12H  . cyanocobalamin  1,000 mcg Oral Daily   Continuous Infusions: . sodium chloride  LOS: 4 days     Cordelia Poche, MD [facility] 11/07/2020, 10:14 AM  If 7PM-7AM, please contact night-coverage [email or web address]

## 2020-11-08 ENCOUNTER — Encounter: Payer: Medicaid Other | Admitting: Nutrition

## 2020-11-08 ENCOUNTER — Other Ambulatory Visit: Payer: Medicaid Other

## 2020-11-08 DIAGNOSIS — N3001 Acute cystitis with hematuria: Secondary | ICD-10-CM | POA: Diagnosis not present

## 2020-11-08 DIAGNOSIS — E46 Unspecified protein-calorie malnutrition: Secondary | ICD-10-CM | POA: Diagnosis not present

## 2020-11-08 DIAGNOSIS — A419 Sepsis, unspecified organism: Secondary | ICD-10-CM | POA: Diagnosis not present

## 2020-11-08 LAB — CULTURE, BLOOD (SINGLE): Culture: NO GROWTH

## 2020-11-08 LAB — CREATININE, SERUM
Creatinine, Ser: 1.24 mg/dL (ref 0.61–1.24)
GFR, Estimated: >60 mL/min (ref >60)

## 2020-11-08 NOTE — NC FL2 (Signed)
[facility] LEVEL OF CARE SCREENING TOOL     IDENTIFICATION  Patient Name: Jamie Wilson Birthdate: 02-Jan-1956 Sex: male Admission Date (Current Location): 11/02/2020  [place] and [place] Number:  Kathleen Argue [phone] [place] and Address:  [facility],  [place], [place]      Provider Number: [id number]  Attending Physician Name and Address:  Mariel Aloe, MD  Relative Name and Phone Number:  Gay Filler   [phone] or Lorelle Formosa [phone]    Current Level of Care: Hospital Recommended Level of Care: [place] Prior Approval Number:    Date Approved/Denied:   PASRR Number: [id number]  Discharge Plan: SNF    Current Diagnoses: Patient Active Problem List   Diagnosis Date Noted  . Palliative care by specialist   . Goals of care, counseling/discussion   . Malignant neoplasm of urinary bladder ([place])   . Lower urinary tract infectious disease 11/03/2020  . Protein-calorie malnutrition, severe 10/12/2020  . Sepsis ([place]) 10/10/2020  . Acute lower UTI 10/02/2020  . 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 Malnutrition ([place]) 10/02/2020  . Acute urinary retention 08/28/2020  . Small bowel obstruction ([place]) 08/16/2020  . Hypercalcemia 08/16/2020  . AKI (acute kidney injury) ([place]) 08/16/2020  . Right nephrolithiasis 08/16/2020    Orientation RESPIRATION BLADDER Height & Weight     Time,Situation,Place,Self  Normal Incontinent Weight: 111 lb 11.2 oz (50.7 kg) Height:  6\' 1"  (185.4 cm)  BEHAVIORAL SYMPTOMS/MOOD NEUROLOGICAL BOWEL NUTRITION STATUS      Continent Diet (Regular diet)  AMBULATORY STATUS COMMUNICATION OF NEEDS Skin   Limited Assist Verbally Normal                       Personal Care Assistance Level of Assistance  Feeding,Dressing,Bathing Bathing Assistance: Limited assistance Feeding assistance: Independent Dressing Assistance: Limited assistance     Functional  Limitations Info  Sight,Hearing,Speech Sight Info: Adequate Hearing Info: Adequate Speech Info: Adequate    SPECIAL CARE FACTORS FREQUENCY  PT (By licensed PT),OT (By licensed OT)     PT Frequency: Minimum 5x a week OT Frequency: Minimum 5x a week            Contractures Contractures Info: Not present    Additional Factors Info  Code Status,Allergies Code Status Info: Full Code Allergies Info: Silvadene           Current Medications (11/08/2020):  This is the current hospital active medication list Current Facility-Administered Medications  Medication Dose Route Frequency Provider Last Rate Last Admin  . (feeding supplement) PROSource Plus liquid 30 mL  30 mL Oral BID WC Mariel Aloe, MD   30 mL at 11/07/20 0953  . 0.9 %  sodium chloride infusion  10 mL/hr Intravenous Once Cardama, Grayce Sessions, MD      . acetaminophen (TYLENOL) tablet 650 mg  650 mg Oral Q6H PRN Marylyn Ishihara, Tyrone A, DO   650 mg at 11/05/20 4401   Or  . acetaminophen (TYLENOL) suppository 650 mg  650 mg Rectal Q6H PRN Cherylann Ratel A, DO      . B-complex with vitamin C tablet 1 tablet  1 tablet Oral Daily Kyle, Tyrone A, DO   1 tablet at 11/07/20 0954  . Chlorhexidine Gluconate Cloth 2 % PADS 6 each  6 each Topical Daily Mariel Aloe, MD   6 each at 11/07/20 [phone]  . ciprofloxacin (CIPRO) tablet 500 mg  500 mg Oral BID Cordelia Poche  A, MD   500 mg at 11/07/20 1957  . feeding supplement (ENSURE ENLIVE / ENSURE PLUS) liquid 237 mL  237 mL Oral TID BM Mariel Aloe, MD   237 mL at 11/07/20 1957  . folic acid (FOLVITE) tablet 1 mg  1 mg Oral Daily Kyle, Tyrone A, DO   1 mg at 11/07/20 0954  . hydrocerin (EUCERIN) cream   Topical BID Mariel Aloe, MD   Given at 11/07/20 2245  . hydrOXYzine (ATARAX) 10 MG/5ML syrup 50 mg  50 mg Oral TID PRN Lovey Newcomer T, NP   50 mg at 11/07/20 0953  . nicotine (NICODERM CQ - dosed in mg/24 hours) patch 14 mg  14 mg Transdermal Daily Basilio Cairo, NP   14 mg at 11/07/20  1000  . ondansetron (ZOFRAN) tablet 4 mg  4 mg Oral Q6H PRN Marylyn Ishihara, Tyrone A, DO       Or  . ondansetron (ZOFRAN) injection 4 mg  4 mg Intravenous Q6H PRN Marylyn Ishihara, Tyrone A, DO      . oxyCODONE-acetaminophen (PERCOCET/ROXICET) 5-325 MG per tablet 1-2 tablet  1-2 tablet Oral Q4H PRN Mariel Aloe, MD   2 tablet at 11/08/20 0448  . polyethylene glycol (MIRALAX / GLYCOLAX) packet 17 g  17 g Oral Daily Mariel Aloe, MD   17 g at 11/07/20 0954  . sodium chloride flush (NS) 0.9 % injection 10-40 mL  10-40 mL Intracatheter Q12H Tegeler, Gwenyth Allegra, MD   10 mL at 11/06/20 2106  . sodium chloride flush (NS) 0.9 % injection 10-40 mL  10-40 mL Intracatheter PRN Tegeler, Gwenyth Allegra, MD      . sodium chloride flush (NS) 0.9 % injection 10-40 mL  10-40 mL Intracatheter Q12H Mariel Aloe, MD   10 mL at 11/06/20 2105  . sodium chloride flush (NS) 0.9 % injection 10-40 mL  10-40 mL Intracatheter PRN Mariel Aloe, MD      . vitamin B-12 (CYANOCOBALAMIN) tablet 1,000 mcg  1,000 mcg Oral Daily Kyle, Tyrone A, DO   1,000 mcg at 11/07/20 4259     Discharge Medications: Please see discharge summary for a list of discharge medications.  Relevant Imaging Results:  Relevant Lab Results:   Additional Information SSN [id number]  Ross Ludwig, LCSW

## 2020-11-08 NOTE — Progress Notes (Signed)
PROGRESS NOTE    Jamie Wilson  LFY:[id number] DOB: [birth year] DOA: 11/02/2020 PCP: Patient, [name]   Brief Narrative: Jamie Wilson is a 65 y.o. 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 Patient presented secondary to abdominal pain and found to have evidence of sepsis possibly secondary to necrotic bladder mass. Empiric antibiotics initiated. Urology and oncology consulted.   Assessment & Plan:   Active Problems:   Malnutrition ([place])   Sepsis ([place])   Protein-calorie malnutrition, severe   Lower urinary tract infectious disease   Palliative care by specialist   Goals of care, counseling/discussion   Sepsis Present on admission. Possible urinary source. CT abd/pelvis significant for a necrotic bladder mass which may be contributing as well. Started on Vancomycin and Zosyn on admission. Blood (1 set)/urine cultures obtained on admission. Urine culture with multiple species. Repeat culture is similar. Blood culture with no growth to date. Patient developed a rash, possibly related to Cefdinir; no reaction to Zosyn. Transitioned to Ciprofloxacin -Continue Ciprofloxacin with recommendation for 2 week total antibiotic course per urology  Urothelial carcinoma of the bladder Necrotic per description. Urology consulted. Consideration of TURBT for debulking. Foley replaced by urology with purulent drainage noted. -Urology recommendations: outpatient follow-up for elective radical cystecomy -Medical oncology recommendations: poor candidate for neoadjuvant chemotherapy with recommendation to consider cystectomy prior to adjuvant chemo/immunotherapy vs radiation oncology for concurrent chemo/radiation therapy  Underweight Severe malnutrition Concern for malnutrition -Dietitian recommendations:   INTERVENTION:   -Ensure Enlive po TID, each supplement provides 350 kcal and 20  grams of  protein  -Prosource Plus PO BID, each provides 100 kcals and 15g protein  Possible UTI In setting of necrotic mass. As mentioned above.  Bilateral toe pain Appears to be possible pressure injury. Does not appear infected but is tender. No osteomyelitis on x-ray -WOC consult pending  Chronic anemia Severely low hemoglobin appears to be erroneous. Hemoglobin of around 10-11 which is baseline.  Hypoglycemia Resolved. -CBG daily  Decreased bowel movements In setting of opiates -Miralax  Coagulopathy Initial INR of 1.6 which has improved to 1.1 and is at baseline  Pressure injury Bilateral great toes. Stage 2. POA -WOC recommendations: Dressing procedure/placement/frequency: No pressure (shoes) Keep feet clean, dry and moisturized.  Dry dressing if patient desires.    DVT prophylaxis: SCDs Code Status:   Code Status: Full Code Family Communication: None at bedside Disposition Plan: Discharge SNF. Medically stable and appears to be stable from a urology standpoint. Pending bed availability   Consultants:   Urology  Medical oncology  Procedures:   None  Antimicrobials:  Vancomycin  Zosyn   Cefdinir  Ciprofloxacin   Subjective: Patient state he is still requiring manual irrigation. Nursing states that debris is small and easily passable through foley catheter.  Objective: Vitals:   11/07/20 0613 11/07/20 1307 11/07/20 2112 11/08/20 0541  BP: 109/77 115/76 134/76 124/72  Pulse: 91 87 95 99  Resp: 18 17 12 12   Temp: 98 F (36.7 C) 97.9 F (36.6 C) 98 F (36.7 C) 98.6 F (37 C)  TempSrc: Oral Oral Oral Oral  SpO2: 95% 95% 96% 93%  Weight:      Height:        Intake/Output Summary (Last 24 hours) at 11/08/2020 1020 Last data filed at 11/08/2020 0500 Gross per 24 hour  Intake 480 ml  Output 2950 ml  Net -2470 ml   [employer]  11/03/20 0852  Weight: 50.7 kg    Examination:  General exam: Appears calm and comfortable Respiratory  system: Clear to auscultation. Respiratory effort normal. Cardiovascular system: S1 & S2 heard, RRR. No murmurs, rubs, gallops or clicks. Gastrointestinal system: Abdomen is nondistended, soft and nontender. No organomegaly or masses felt. Normal bowel sounds heard. Central nervous system: Alert and oriented. No focal neurological deficits. Musculoskeletal: No edema. No calf tenderness Skin: No cyanosis. Psychiatry: Judgement and insight appear normal. Mood & affect appropriate.    Data Reviewed: I have personally reviewed following labs and imaging studies  CBC Lab Results  Component Value Date   WBC 14.8 (H) 11/06/2020   RBC 3.69 (L) 11/06/2020   HGB 10.5 (L) 11/06/2020   HCT 33.4 (L) 11/06/2020   MCV 90.5 11/06/2020   MCH 28.5 11/06/2020   PLT 305 11/06/2020   MCHC 31.4 11/06/2020   RDW 15.3 11/06/2020   LYMPHSABS 1.3 11/04/2020   MONOABS 0.7 11/04/2020   EOSABS 1.9 (H) 11/04/2020   BASOSABS 0.1 [id number]     Last metabolic panel Lab Results  Component Value Date   NA 138 11/05/2020   K 3.6 11/05/2020   CL 111 11/05/2020   CO2 20 (L) 11/05/2020   BUN 14 11/05/2020   CREATININE 1.24 11/08/2020   GLUCOSE 93 11/05/2020   GFRNONAA >60 11/08/2020   GFRAA >60 11/21/2019   CALCIUM 11.1 (H) 11/05/2020   PHOS 1.7 (L) 10/05/2020   PROT 5.6 (L) 11/04/2020   ALBUMIN 2.0 (L) 11/04/2020   BILITOT 0.3 11/04/2020   ALKPHOS 70 11/04/2020   AST 14 (L) 11/04/2020   ALT 10 11/04/2020   ANIONGAP 7 11/05/2020    CBG (last 3)  Recent Labs    11/06/20 0431 11/06/20 1204 11/07/20 0610  GLUCAP 111* 100* 110*     GFR: Estimated Creatinine Clearance: 42.6 mL/min (by C-G formula based on SCr of 1.24 mg/dL).  Coagulation Profile: Recent Labs  Lab 11/03/20 0230 11/04/20 0740  INR 1.6* 1.1    Recent Results (from the past 240 hour(s))  Blood culture (routine single)     Status: None   Collection Time: 11/03/20  2:53 AM   Specimen: BLOOD LEFT WRIST  Result Value Ref  Range Status   Specimen Description   Final    BLOOD LEFT WRIST Performed at [facility] [address], [place], [place] [address]    Special Requests   Final    BOTTLES DRAWN AEROBIC AND ANAEROBIC Blood Culture results may not be optimal due to an inadequate volume of blood received in culture bottles Performed at [facility] [address], [place], [place] [address]    Culture   Final    NO GROWTH 5 DAYS Performed at [facility], [place] [address], [place], [place] [address]    Report Status 11/08/2020 FINAL  Final  Urine culture     Status: Abnormal   Collection Time: 11/03/20  3:30 AM   Specimen: In/Out Cath Urine  Result Value Ref Range Status   Specimen Description   Final    IN/OUT CATH URINE Performed at [facility] [address] [place], [place] [address]    Special Requests   Final    NONE Performed at [facility], [place] [address], [place], [place] [address]    Culture MULTIPLE SPECIES PRESENT, SUGGEST RECOLLECTION (A)  Final   Report Status 11/04/2020 FINAL  Final  Resp Panel by RT-PCR (Flu  A&B, Covid) Nasopharyngeal Swab     Status: None   Collection Time: 11/03/20 10:20 AM   Specimen: Nasopharyngeal Swab; Nasopharyngeal(NP) swabs in vial transport medium  Result Value Ref Range Status   SARS Coronavirus 2 by RT PCR NEGATIVE NEGATIVE Final    Comment: (NOTE) SARS-CoV-2 target nucleic acids are NOT DETECTED.  The SARS-CoV-2 RNA is generally detectable in upper respiratory specimens during the acute phase of infection. The lowest concentration of SARS-CoV-2 viral copies this assay can detect is 138 copies/mL. A negative result does not preclude SARS-Cov-2 infection and should not be used as the sole basis for treatment or other patient management decisions. A negative result may occur with  improper specimen collection/handling, submission of specimen other than nasopharyngeal swab,  presence of viral mutation(s) within the areas targeted by this assay, and inadequate number of viral copies(<138 copies/mL). A negative result must be combined with clinical observations, patient history, and epidemiological information. The expected result is Negative.  Fact Sheet for Patients:  EntrepreneurPulse.com.au  Fact Sheet for Healthcare Providers:  IncredibleEmployment.be  This test is no t yet approved or cleared by the [place] FDA and  has been authorized for detection and/or diagnosis of SARS-CoV-2 by FDA under an Emergency Use Authorization (EUA). This EUA will remain  in effect (meaning this test can be used) for the duration of the COVID-19 declaration under Section 564(b)(1) of the Act, 21 U.S.C.section 360bbb-3(b)(1), unless the authorization is terminated  or revoked sooner.       Influenza A by PCR NEGATIVE NEGATIVE Final   Influenza B by PCR NEGATIVE NEGATIVE Final    Comment: (NOTE) The Xpert Xpress SARS-CoV-2/FLU/RSV plus assay is intended as an aid in the diagnosis of influenza from Nasopharyngeal swab specimens and should not be used as a sole basis for treatment. Nasal washings and aspirates are unacceptable for Xpert Xpress SARS-CoV-2/FLU/RSV testing.  Fact Sheet for Patients: EntrepreneurPulse.com.au  Fact Sheet for Healthcare Providers: IncredibleEmployment.be  This test is not yet approved or cleared by the [place] FDA and has been authorized for detection and/or diagnosis of SARS-CoV-2 by FDA under an Emergency Use Authorization (EUA). This EUA will remain in effect (meaning this test can be used) for the duration of the COVID-19 declaration under Section 564(b)(1) of the Act, 21 U.S.C. section 360bbb-3(b)(1), unless the authorization is terminated or revoked.  Performed at [facility], [place] [address] [place], [place] [address]    Culture, Urine     Status: Abnormal   Collection Time: 11/04/20 11:43 AM   Specimen: Urine, Catheterized  Result Value Ref Range Status   Specimen Description   Final    URINE, CATHETERIZED Performed at [facility] [address] [place], [place] [address]    Special Requests   Final    NONE Performed at [facility], [place] [address], [place], [place] [address]    Culture MULTIPLE SPECIES PRESENT, SUGGEST RECOLLECTION (A)  Final   Report Status 11/05/2020 FINAL  Final        Radiology Studies: No results found.      Scheduled Meds: . (feeding supplement) PROSource Plus  30 mL Oral BID WC  . B-complex with vitamin C  1 tablet Oral Daily  . Chlorhexidine Gluconate Cloth  6 each Topical Daily  . ciprofloxacin  500 mg Oral BID  . feeding supplement  237 mL Oral TID BM  . folic acid  1 mg Oral Daily  . hydrocerin  Topical BID  . nicotine  14 mg Transdermal Daily  . polyethylene glycol  17 g Oral Daily  . sodium chloride flush  10-40 mL Intracatheter Q12H  . sodium chloride flush  10-40 mL Intracatheter Q12H  . cyanocobalamin  1,000 mcg Oral Daily   Continuous Infusions: . sodium chloride       LOS: 5 days     Cordelia Poche, MD [facility] 11/08/2020, 10:20 AM  If 7PM-7AM, please contact night-coverage [email or web address]

## 2020-11-08 NOTE — TOC Progression Note (Signed)
Transition of Care [facility]) - Progression Note    Patient Details  Name: Jamie Wilson MRN: [id number] Date of Birth: [birth year]  [facility] CM/SW Contact  Ross Ludwig, [place] Phone Number: 11/08/2020, 11:21 AM  Clinical Narrative:      Patient needs SNF for rehab, CSW sent patient's information to different SNFs awaiting bed offers.  CSW to continue to follow patient's progress throughout discharge planning.     Expected Discharge Plan and Services           Expected Discharge Date:  (unknown)                                     Social Determinants of Health (SDOH) Interventions    Readmission Risk Interventions Readmission Risk Prevention Plan 10/04/2020  Transportation Screening Complete  PCP or Specialist Appt within 5-7 Days Complete  Home Care Screening Complete  Medication Review (RN CM) Complete

## 2020-11-08 NOTE — Progress Notes (Signed)
  Subjective: Pain controlled. No nausea or emesis.  He has required additional irrigations for debris but the catheter is draining well now.    Objective: Vital signs in last 24 hours: Temp:  [97.9 F (36.6 C)-98.6 F (37 C)] 98.6 F (37 C) (01/11 0541) Pulse Rate:  [87-99] 99 (01/11 0541) Resp:  [12-17] 12 (01/11 0541) BP: (115-134)/(72-76) 124/72 (01/11 0541) SpO2:  [93 %-96 %] 93 % (01/11 0541)  Intake/Output from previous day: 01/10 0701 - 01/11 0700 In: 480 [P.O.:480] Out: 2950 [Urine:2950] Intake/Output this shift: No intake/output data recorded.  Physical Exam:  General: Alert and oriented Foley: cloudy yellow  Lab Results: Recent Labs    11/05/20 0925 11/06/20 0500  HGB 11.0* 10.5*  HCT 35.7* 33.4*   BMET Recent Labs    11/05/20 0925 11/06/20 0500 11/07/20 0454 11/08/20 0432  NA 138  --   --   --   K 3.6  --   --   --   CL 111  --   --   --   CO2 20*  --   --   --   GLUCOSE 93  --   --   --   BUN 14  --   --   --   CREATININE 0.84   < > 1.01 1.24  CALCIUM 11.1*  --   --   --    < > = values in this interval not displayed.     Studies/Results: No results found.  Assessment/Plan: 1. 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 Jeffie Pollock on 10/06/2020. At the time, he was found to have a very large necrotic bladder tumor at the dome, anterior and posterior walls of the bladder. CT A/P 11/03/2020 with progression of bladder mass associated with areas of necrosis and intraluminal air. Potentially metastatic pelvic lymph nodes which are enhancing measuring up to 8 mm. 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 However, this may be related to necrotic tumor with underlying infection. No identification of colovesicular fistula during  TURBT on 10/06/2020 with Dr. Jeffie Pollock. 4.Bilateral nephrolithiasis: CT A/P 11/03/2020 with bilateral nonobstructing stones. No evidence of ureteral stone. Of note, prior CT A/P 10/02/2020 revealed 3 mm proximal right ureteral stone which is now no longer present.  -Foley to gravity. -Manually irrigate his bladder as needed for decreased drainage, obstructive debris or clots. -Continue 2 weeks of abx -F/u in office to consider RC/IC. Messaged schedulers to arrange.    LOS: 5 days   Irine Seal MD 11/08/2020, [facility]     Patient ID: Jamie Wilson, male   DOB: [birth year], 65 y.o.   MRN: [id number]

## 2020-11-09 ENCOUNTER — Encounter: Payer: Self-pay | Admitting: General Practice

## 2020-11-09 DIAGNOSIS — N3001 Acute cystitis with hematuria: Secondary | ICD-10-CM | POA: Diagnosis not present

## 2020-11-09 DIAGNOSIS — A419 Sepsis, unspecified organism: Secondary | ICD-10-CM | POA: Diagnosis not present

## 2020-11-09 DIAGNOSIS — E46 Unspecified protein-calorie malnutrition: Secondary | ICD-10-CM | POA: Diagnosis not present

## 2020-11-09 LAB — CBC
HCT: 37.4 % — ABNORMAL LOW (ref 39.0–52.0)
Hemoglobin: 11.3 g/dL — ABNORMAL LOW (ref 13.0–17.0)
MCH: 27.7 pg (ref 26.0–34.0)
MCHC: 30.2 g/dL (ref 30.0–36.0)
MCV: 91.7 fL (ref 80.0–100.0)
Platelets: 322 K/uL (ref 150–400)
RBC: 4.08 MIL/uL — ABNORMAL LOW (ref 4.22–5.81)
RDW: 15.5 % (ref 11.5–15.5)
WBC: 19.7 K/uL — ABNORMAL HIGH (ref 4.0–10.5)
nRBC: 0 % (ref 0.0–0.2)

## 2020-11-09 LAB — BASIC METABOLIC PANEL
Anion gap: 9 (ref 5–15)
Anion gap: 5 (ref 5–15)
BUN: 47 mg/dL — ABNORMAL HIGH (ref 8–23)
BUN: 46 mg/dL — ABNORMAL HIGH (ref 8–23)
CO2: 25 mmol/L (ref 22–32)
CO2: 26 mmol/L (ref 22–32)
Calcium: >15 mg/dL (ref 8.9–10.3)
Calcium: 14.4 mg/dL (ref 8.9–10.3)
Chloride: 102 mmol/L (ref 98–111)
Chloride: 108 mmol/L (ref 98–111)
Creatinine, Ser: 1.95 mg/dL — ABNORMAL HIGH (ref 0.61–1.24)
Creatinine, Ser: 2.01 mg/dL — ABNORMAL HIGH (ref 0.61–1.24)
GFR, Estimated: 37 mL/min — ABNORMAL LOW (ref >60)
GFR, Estimated: 36 mL/min — ABNORMAL LOW (ref >60)
Glucose, Bld: 125 mg/dL — ABNORMAL HIGH (ref 70–99)
Glucose, Bld: 110 mg/dL — ABNORMAL HIGH (ref 70–99)
Potassium: 4.7 mmol/L (ref 3.5–5.1)
Potassium: 4.1 mmol/L (ref 3.5–5.1)
Sodium: 136 mmol/L (ref 135–145)
Sodium: 139 mmol/L (ref 135–145)

## 2020-11-09 LAB — GLUCOSE, CAPILLARY: Glucose-Capillary: 123 mg/dL — ABNORMAL HIGH (ref 70–99)

## 2020-11-09 LAB — CREATININE, SERUM
Creatinine, Ser: 1.8 mg/dL — ABNORMAL HIGH (ref 0.61–1.24)
GFR, Estimated: 41 mL/min — ABNORMAL LOW (ref >60)

## 2020-11-09 MED ORDER — CIPROFLOXACIN HCL 500 MG PO TABS
500.0000 mg | ORAL_TABLET | Freq: Every day | ORAL | Status: DC
  Administered 2020-11-10 – 2020-11-12 (×3): 500 mg via ORAL
  Filled 2020-11-09 (×3): qty 1

## 2020-11-09 MED ORDER — SODIUM CHLORIDE 0.9 % IV BOLUS
1000.0000 mL | Freq: Once | INTRAVENOUS | Status: AC
  Administered 2020-11-09: 1000 mL via INTRAVENOUS

## 2020-11-09 MED ORDER — SODIUM CHLORIDE 0.9 % IV SOLN: INTRAVENOUS | Status: DC

## 2020-11-09 MED ORDER — ZOLEDRONIC ACID 4 MG/5ML IV CONC
4.0000 mg | Freq: Once | INTRAVENOUS | Status: AC
  Administered 2020-11-09: 4 mg via INTRAVENOUS
  Filled 2020-11-09: qty 5

## 2020-11-09 MED ORDER — POLYETHYLENE GLYCOL 3350 17 G PO PACK
17.0000 g | PACK | Freq: Two times a day (BID) | ORAL | Status: DC
  Administered 2020-11-09 – 2020-11-19 (×16): 17 g via ORAL
  Filled 2020-11-09 (×19): qty 1

## 2020-11-09 NOTE — TOC Progression Note (Addendum)
Transition of Care [facility]) - Progression Note    Patient Details  Name: Jamie Wilson MRN: [id number] Date of Birth: October 18, 1956  [facility]) CM/SW Contact  Jacie Tristan, Juliann Pulse, RN Phone Number: 11/09/2020, 11:36 AM  Clinical Narrative: Spoke to patient about the 1 bed offer-patient voiced understanding-rep Kim will check for bed availability, & auth-await outcome. 1:45p-Patient agreed to contact his friend listed as contact person Randall Hiss tel#[phone] is concerned about patient's home environment, & resources for asst once back @ home-informed Randall Hiss patient has a medicaid case worker that can asst w/resources-voiced understanding. Informed nsg that Randall Hiss wants patient to know he will visit him today.  1. 0.6 mi [facility] [place], [place] [address] [phone] Overall rating Below average 2. 1.4 mi [employer] [facility] [address] [place], [place] [address] [phone] Overall rating Below average 3. 2.9 mi [facility] [place], [place] [address] [phone] Overall rating Much below average 4. 8.8 [facility] [address] [place], [place] [address] [phone] Overall rating Much below average 5. 15.3 mi [facility] [address] [place], [place] [address] [phone] Overall rating Much below average 6. 17.7 mi [facility] [address] [place], [place] [address] [phone] Overall rating Much below average 7. 17.9 [place], [place] [address] [phone] Overall rating Below average 8. 17.8 [facility] [place], [place] [address] [phone] Overall rating Above average 9. 18.6 [place], [place] [address] [phone] Overall rating Much  above average 10. 20.1 mi [employer] [address] [place], [place] [address] [phone] Overall rating Much above average 11. 20.9 [facility] [address] [place], [place] [address] [phone] Overall rating Much below average 12. 20.3 mi [facility] [address] [place], [place] [address] [phone] Overall rating Average 13. 21.8 mi [facility] [place], [place] [address] [phone] Overall rating Much below average 14. 22.1 [place] [address] [place], [place] [address] [phone] Overall rating Much below average 15. 22.1 [place] [address] [place],  [address] [phone]          Expected Discharge Plan and Services           Expected Discharge Date:  (unknown)                                     Social Determinants of Health (SDOH) Interventions    Readmission Risk Interventions Readmission Risk Prevention Plan 10/04/2020  Transportation Screening Complete  PCP or Specialist Appt within 5-7 Days Complete  Home Care Screening Complete  Medication Review (RN CM) Complete

## 2020-11-09 NOTE — Progress Notes (Addendum)
PROGRESS NOTE    Asaph Serena  ACZ:[id number] DOB: 1956/01/28 DOA: 11/02/2020 PCP: Patient, [name]   Brief Narrative: Cinch Ormond is a 65 y.o. 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 Patient presented secondary to abdominal pain and found to have evidence of sepsis possibly secondary to necrotic bladder mass. Empiric antibiotics initiated. Urology and oncology consulted.   Assessment & Plan:   Active Problems:   Malnutrition ([place])   Sepsis ([place])   Protein-calorie malnutrition, severe   Lower urinary tract infectious disease   Palliative care by specialist   Goals of care, counseling/discussion   Sepsis Present on admission. Possible urinary source. CT abd/pelvis significant for a necrotic bladder mass which may be contributing as well. Started on Vancomycin and Zosyn on admission. Blood (1 set)/urine cultures obtained on admission. Urine culture with multiple species. Repeat culture is similar. Blood culture with no growth to date. Patient developed a rash, possibly related to Cefdinir; no reaction to Zosyn. Transitioned to Ciprofloxacin -Continue Ciprofloxacin with recommendation for 2 week total antibiotic course per urology  Urothelial carcinoma of the bladder Necrotic per description. Urology consulted. Consideration of TURBT for debulking. Foley replaced by urology with purulent drainage noted. -Urology recommendations: outpatient follow-up for elective radical cystecomy -Medical oncology recommendations: poor candidate for neoadjuvant chemotherapy with recommendation to consider cystectomy prior to adjuvant chemo/immunotherapy vs radiation oncology for concurrent chemo/radiation therapy  Hypercalcemia of malignancy Asymptomatic. BMP came back with severe hypercalcemia of >15 -NS 1L bolus followed by NS @ 150 ml/hr -Zoledronic acid -Repeat BMP this evening and in AM -Intact  PTH  Underweight Severe malnutrition Concern for malnutrition -Dietitian recommendations:   INTERVENTION:   -Ensure Enlive po TID, each supplement provides 350 kcal and 20  grams of protein  -Prosource Plus PO BID, each provides 100 kcals and 15g protein  Possible UTI In setting of necrotic mass. As mentioned above.  AKI Baseline creatinine of 0.8. Creatinine of 1.80 today. Patient is net negative 7.8 and AKI could be from hypovolemia. BP initially normotensive but slightly diastolic hypotension today. Pulse is slightly elevated. -Obtain BMP today to confirm -1000 mL IV fluid bolus  Bilateral toe pain Appears to be possible pressure injury. Does not appear infected but is tender. No osteomyelitis on x-ray -WOC consult pending  Chronic anemia Severely low hemoglobin appears to be erroneous. Hemoglobin of around 10-11 which is baseline.  Hypoglycemia Resolved. -CBG daily  Constipation In setting of opiates -Miralax  Coagulopathy Initial INR of 1.6 which has improved to 1.1 and is at baseline  Pressure injury Bilateral great toes. Stage 2. POA -WOC recommendations: Dressing procedure/placement/frequency: No pressure (shoes) Keep feet clean, dry and moisturized.  Dry dressing if patient desires.    DVT prophylaxis: SCDs Code Status:   Code Status: Full Code Family Communication: None at bedside Disposition Plan: Discharge SNF. With elevated creatinine, will need to correct prior to discharge. Now anticipate he will be medically ready for discharge in 24 hours.  Addendum: with new severe hypercalcemia, likely discharge >24 hours. Anticipate 2-4 days   Consultants:   Urology  Medical oncology  Procedures:   None  Antimicrobials:  Vancomycin  Zosyn   Cefdinir  Ciprofloxacin   Subjective: No concerns today. Afebrile.  Objective: Vitals:   11/07/20 2112 11/08/20 0541 11/08/20 2052 11/09/20 0417  BP: 134/76 124/72 107/71 (!) 104/58  Pulse: 95 99  (!) 107 (!) 104  Resp: 12 12  16 20  Temp: 98 F (36.7 C) 98.6 F (37 C) 97.8 F (36.6 C) 98.8 F (37.1 C)  TempSrc: Oral Oral Axillary Oral  SpO2: 96% 93% 96% 93%  Weight:      Height:        Intake/Output Summary (Last 24 hours) at 11/09/2020 1155 Last data filed at 11/09/2020 0535 Gross per 24 hour  Intake 350 ml  Output 1725 ml  Net -1375 ml   Filed Weights   11/03/20 0852  Weight: 50.7 kg    Examination:  General exam: Appears calm and comfortable. Thin appearing. Chronically malnourished appearing. Respiratory system: Clear to auscultation. Respiratory effort normal. Cardiovascular system: S1 & S2 heard, RRR. No murmurs, rubs, gallops or clicks. Gastrointestinal system: Abdomen is nondistended, soft and nontender. No organomegaly or masses felt. Normal bowel sounds heard. Central nervous system: Alert and oriented. No focal neurological deficits. Musculoskeletal: No edema. No calf tenderness Skin: No cyanosis. No rashes Psychiatry: Judgement and insight appear normal. Mood & affect appropriate.    Data Reviewed: I have personally reviewed following labs and imaging studies  CBC Lab Results  Component Value Date   WBC 14.8 (H) 11/06/2020   RBC 3.69 (L) 11/06/2020   HGB 10.5 (L) 11/06/2020   HCT 33.4 (L) 11/06/2020   MCV 90.5 11/06/2020   MCH 28.5 11/06/2020   PLT 305 11/06/2020   MCHC 31.4 11/06/2020   RDW 15.3 11/06/2020   LYMPHSABS 1.3 11/04/2020   MONOABS 0.7 11/04/2020   EOSABS 1.9 (H) 11/04/2020   BASOSABS 0.1 25/85/2778     Last metabolic panel Lab Results  Component Value Date   NA 138 11/05/2020   K 3.6 11/05/2020   CL 111 11/05/2020   CO2 20 (L) 11/05/2020   BUN 14 11/05/2020   CREATININE 1.80 (H) 11/09/2020   GLUCOSE 93 11/05/2020   GFRNONAA 41 (L) 11/09/2020   GFRAA >60 11/21/2019   CALCIUM 11.1 (H) 11/05/2020   PHOS 1.7 (L) 10/05/2020   PROT 5.6 (L) 11/04/2020   ALBUMIN 2.0 (L) 11/04/2020   BILITOT 0.3 11/04/2020   ALKPHOS 70  11/04/2020   AST 14 (L) 11/04/2020   ALT 10 11/04/2020   ANIONGAP 7 11/05/2020    CBG (last 3)  Recent Labs    11/06/20 1204 11/07/20 0610 11/09/20 0558  GLUCAP 100* 110* 123*     GFR: Estimated Creatinine Clearance: 29.3 mL/min (A) (by C-G formula based on SCr of 1.8 mg/dL (H)).  Coagulation Profile: Recent Labs  Lab 11/03/20 0230 11/04/20 0740  INR 1.6* 1.1    Recent Results (from the past 240 hour(s))  Blood culture (routine single)     Status: None   Collection Time: 11/03/20  2:53 AM   Specimen: BLOOD LEFT WRIST  Result Value Ref Range Status   Specimen Description   Final    BLOOD LEFT WRIST Performed at [facility] [address], [place], [place] [address]    Special Requests   Final    BOTTLES DRAWN AEROBIC AND ANAEROBIC Blood Culture results may not be optimal due to an inadequate volume of blood received in culture bottles Performed at [facility] [address] [place], [place] [address]    Culture   Final    NO GROWTH 5 DAYS Performed at [facility], [place] [address] [place], [place] [address]    Report Status 11/08/2020 FINAL  Final  Urine culture     Status: Abnormal   Collection Time:  11/03/20  3:30 AM   Specimen: In/Out Cath Urine  Result Value Ref Range Status   Specimen Description   Final    IN/OUT CATH URINE Performed at [facility], [place] [address] [place], [place] [address]    Special Requests   Final    NONE Performed at [facility], [place] [address], [place], [place] [address]    Culture MULTIPLE SPECIES PRESENT, SUGGEST RECOLLECTION (A)  Final   Report Status 11/04/2020 FINAL  Final  Resp Panel by RT-PCR (Flu A&B, Covid) Nasopharyngeal Swab     Status: None   Collection Time: 11/03/20 10:20 AM   Specimen: Nasopharyngeal Swab; Nasopharyngeal(NP) swabs in vial transport medium  Result Value Ref Range Status   SARS Coronavirus 2 by RT PCR NEGATIVE NEGATIVE  Final    Comment: (NOTE) SARS-CoV-2 target nucleic acids are NOT DETECTED.  The SARS-CoV-2 RNA is generally detectable in upper respiratory specimens during the acute phase of infection. The lowest concentration of SARS-CoV-2 viral copies this assay can detect is 138 copies/mL. A negative result does not preclude SARS-Cov-2 infection and should not be used as the sole basis for treatment or other patient management decisions. A negative result may occur with  improper specimen collection/handling, submission of specimen other than nasopharyngeal swab, presence of viral mutation(s) within the areas targeted by this assay, and inadequate number of viral copies(<138 copies/mL). A negative result must be combined with clinical observations, patient history, and epidemiological information. The expected result is Negative.  Fact Sheet for Patients:  EntrepreneurPulse.com.au  Fact Sheet for Healthcare Providers:  IncredibleEmployment.be  This test is no t yet approved or cleared by the [place] FDA and  has been authorized for detection and/or diagnosis of SARS-CoV-2 by FDA under an Emergency Use Authorization (EUA). This EUA will remain  in effect (meaning this test can be used) for the duration of the COVID-19 declaration under Section 564(b)(1) of the Act, 21 U.S.C.section 360bbb-3(b)(1), unless the authorization is terminated  or revoked sooner.       Influenza A by PCR NEGATIVE NEGATIVE Final   Influenza B by PCR NEGATIVE NEGATIVE Final    Comment: (NOTE) The Xpert Xpress SARS-CoV-2/FLU/RSV plus assay is intended as an aid in the diagnosis of influenza from Nasopharyngeal swab specimens and should not be used as a sole basis for treatment. Nasal washings and aspirates are unacceptable for Xpert Xpress SARS-CoV-2/FLU/RSV testing.  Fact Sheet for Patients: EntrepreneurPulse.com.au  Fact Sheet for Healthcare  Providers: IncredibleEmployment.be  This test is not yet approved or cleared by the [place] FDA and has been authorized for detection and/or diagnosis of SARS-CoV-2 by FDA under an Emergency Use Authorization (EUA). This EUA will remain in effect (meaning this test can be used) for the duration of the COVID-19 declaration under Section 564(b)(1) of the Act, 21 U.S.C. section 360bbb-3(b)(1), unless the authorization is terminated or revoked.  Performed at [facility], [place] [address], [place], [place] [address]   Culture, Urine     Status: Abnormal   Collection Time: 11/04/20 11:43 AM   Specimen: Urine, Catheterized  Result Value Ref Range Status   Specimen Description   Final    URINE, CATHETERIZED Performed at [facility] [address] [place], [place] [address]    Special Requests   Final    NONE Performed at [facility], [place] [address] [place], [place] [address]    Culture MULTIPLE SPECIES PRESENT, SUGGEST RECOLLECTION (A)  Final   Report Status 11/05/2020 FINAL  Final        Radiology Studies: No results found.      Scheduled Meds: . (feeding supplement) PROSource Plus  30 mL Oral BID WC  . B-complex with vitamin C  1 tablet Oral Daily  . Chlorhexidine Gluconate Cloth  6 each Topical Daily  . ciprofloxacin  500 mg Oral BID  . feeding supplement  237 mL Oral TID BM  . folic acid  1 mg Oral Daily  . hydrocerin   Topical BID  . nicotine  14 mg Transdermal Daily  . polyethylene glycol  17 g Oral Daily  . sodium chloride flush  10-40 mL Intracatheter Q12H  . sodium chloride flush  10-40 mL Intracatheter Q12H  . cyanocobalamin  1,000 mcg Oral Daily   Continuous Infusions: . sodium chloride       LOS: 6 days     Cordelia Poche, MD [facility] 11/09/2020, 11:55 AM  If 7PM-7AM, please contact night-coverage [email or web address]

## 2020-11-09 NOTE — Progress Notes (Signed)
CRITICAL VALUE ALERT  Critical Value: Ca+>15.0  Date & Time Notied:  11/09/2020 - 1353  Provider Notified: Mariel Aloe MD  Orders Received/Actions taken: Bolus of Normal Saline. Awaiting more orders.

## 2020-11-09 NOTE — Progress Notes (Signed)
[place] CSW Progress Notes  Urgent referral received from medical oncologist 11/01/2020.  [place] CSW team has been unable to contact patient, patient currently inpatient with discharge plan of SNF.  [facility] CSW will close outpatient referral - please reconsult as needed when patient returns to [facility] as an outpatient.  Edwyna Shell, LCSW Clinical Social Worker Phone:  [phone]

## 2020-11-09 NOTE — Plan of Care (Signed)
  Problem: Pain Managment: Goal: General experience of comfort will improve Outcome: Adequate for Discharge   

## 2020-11-09 NOTE — Plan of Care (Signed)

## 2020-11-09 NOTE — Progress Notes (Signed)
CRITICAL VALUE STICKER  CRITICAL VALUE: calcium 14.4  CRITICAL RESULT CALLED TO, READ BACK BY AND VERIFIED WITH:  Brayant Dorr,P. RN @2020  ON 01.12.2022 BY COHEN,K   DATE & TIME NOTIFIED:  2054 11/09/20  MESSENGER (representative from lab):  MD NOTIFIED: B.Morrison  RESPONSE: no new orders received

## 2020-11-10 ENCOUNTER — Telehealth: Payer: Self-pay | Admitting: General Practice

## 2020-11-10 DIAGNOSIS — R7989 Other specified abnormal findings of blood chemistry: Secondary | ICD-10-CM

## 2020-11-10 DIAGNOSIS — D649 Anemia, unspecified: Secondary | ICD-10-CM | POA: Diagnosis not present

## 2020-11-10 DIAGNOSIS — N390 Urinary tract infection, site not specified: Secondary | ICD-10-CM | POA: Diagnosis not present

## 2020-11-10 DIAGNOSIS — C679 Malignant neoplasm of bladder, unspecified: Secondary | ICD-10-CM | POA: Diagnosis not present

## 2020-11-10 LAB — BASIC METABOLIC PANEL
Anion gap: 8 (ref 5–15)
BUN: 44 mg/dL — ABNORMAL HIGH (ref 8–23)
CO2: 23 mmol/L (ref 22–32)
Calcium: 13.2 mg/dL (ref 8.9–10.3)
Chloride: 108 mmol/L (ref 98–111)
Creatinine, Ser: 1.71 mg/dL — ABNORMAL HIGH (ref 0.61–1.24)
GFR, Estimated: 44 mL/min — ABNORMAL LOW (ref >60)
Glucose, Bld: 95 mg/dL (ref 70–99)
Potassium: 3.7 mmol/L (ref 3.5–5.1)
Sodium: 139 mmol/L (ref 135–145)

## 2020-11-10 LAB — PARATHYROID HORMONE, INTACT (NO CA): PTH: 5 pg/mL — ABNORMAL LOW (ref 15–65)

## 2020-11-10 LAB — GLUCOSE, CAPILLARY
Glucose-Capillary: 88 mg/dL (ref 70–99)
Glucose-Capillary: 90 mg/dL (ref 70–99)

## 2020-11-10 NOTE — Progress Notes (Signed)
PT Cancellation Note  Patient Details Name: Jamie Wilson MRN: [id number] DOB: July 02, 1956   Cancelled Treatment:    Reason Eval/Treat Not Completed: Patient declined, no reason specified. Pt refuses therapy, attempted to educate on benefit of PT interventions and therapeutic process without success. Pt declined therapy and states "I want to stay in bed" and "too stiff" with multiple attempts to assist pt with mobilization or exercises. Will check back if schedule permits- RN notified.   Talbot Grumbling PT, DPT 11/10/20, 12:26 PM

## 2020-11-10 NOTE — TOC Progression Note (Signed)
Transition of Care [facility]) - Progression Note    Patient Details  Name: Jamie Wilson MRN: [id number] Date of Birth: [birth year]  [facility] CM/SW Contact  Lestine Rahe, Juliann Pulse, RN Phone Number: 11/10/2020, 10:09 AM  Clinical Narrative: Elisabeth Most rep Kim-needs clarification if patient will receive chemo, & transport-who will provide. Will await response from Oncology if to receive chemo while @ SNF.[facility] will need to start auth.      Expected Discharge Plan: Skilled Nursing Facility Barriers to Discharge: Continued Medical Work up,Insurance Authorization  Expected Discharge Plan and Services Expected Discharge Plan: [place]         Expected Discharge Date:  (unknown)                                     Social Determinants of Health (SDOH) Interventions    Readmission Risk Interventions Readmission Risk Prevention Plan 10/04/2020  Transportation Screening Complete  PCP or Specialist Appt within 5-7 Days Complete  Home Care Screening Complete  Medication Review (RN CM) Complete

## 2020-11-10 NOTE — Progress Notes (Signed)
CRITICAL VALUE ALERT  Critical Value: Calcium 13.2  Date & Time Notied:  0810  Provider Notified: MD Karleen Hampshire  Orders Received/Actions taken: Awaiting orders

## 2020-11-10 NOTE — TOC Progression Note (Signed)
Transition of Care [facility]) - Progression Note    Patient Details  Name: Jamie Wilson MRN: [id number] Date of Birth: Jul 11, 1956  [facility]) CM/SW Contact  Ross Ludwig, [place] Phone Number: 11/10/2020, 4:41 PM  Clinical Narrative:     CSW spoke to [facility] rep Carollee Leitz, she stated that they do not have any beds available at this time, however as soon as a bed opens up she will let CSW know.  This is the only offer patient has at this time.   Expected Discharge Plan: Skilled Nursing Facility Barriers to Discharge: Continued Medical Work up,Insurance Authorization  Expected Discharge Plan and Services Expected Discharge Plan: [place]         Expected Discharge Date:  (unknown)                                     Social Determinants of Health (SDOH) Interventions    Readmission Risk Interventions Readmission Risk Prevention Plan 10/04/2020  Transportation Screening Complete  PCP or Specialist Appt within 5-7 Days Complete  Home Care Screening Complete  Medication Review (RN CM) Complete

## 2020-11-10 NOTE — Telephone Encounter (Signed)
[place] CSW Progress Notes  Call from [place], [place].  She needs plan from oncologist re chemotherapy for patient.  She is working to find SNF rehab placement for patient upon hospital discharge.  Secure chat sent to desk RN, oncologist and [facility] RN CM to discuss treatment plan.  Edwyna Shell, LCSW Clinical Social Worker Phone:  [phone]

## 2020-11-10 NOTE — Progress Notes (Signed)
OT Cancellation Note  Patient Details Name: Jamie Wilson MRN: [id number] DOB: [birth year]   Cancelled Treatment:    Reason Eval/Treat Not Completed: Patient declined, no reason specified  Lenward Chancellor 11/10/2020, 3:51 PM

## 2020-11-10 NOTE — Progress Notes (Signed)
PROGRESS NOTE    Jamie Wilson  GMW:[id number] DOB: 1956/09/28 DOA: 11/02/2020 PCP: Patient, [name]   Chief Complaint  Patient presents with  . Pain  . Bladder Cancer    Brief Narrative: Jamie Wilson is a 65 y.o. 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 Patient presented secondary to abdominal pain and found to have evidence of sepsis possibly secondary to necrotic bladder mass. Empiric antibiotics initiated. Urology and oncology consulted.  Assessment & Plan:   Active Problems:   Malnutrition ([place])   Sepsis ([place])   Protein-calorie malnutrition, severe   Lower urinary tract infectious disease   Palliative care by specialist   Goals of care, counseling/discussion    Sepsis probably from urinary source. CT of the abdomen and pelvis significant for necrotic bladder mass. Patient was empirically started on broad-spectrum IV antibiotics.  Urine culture shows multiple species of bacteria.  Blood cultures negative so far. Urology consulted recommended to continue with ciprofloxacin for total of 2 weeks of antibiotics.     Urothelial carcinoma of the bladder Urology consulted and Foley catheter placed.  Recommend outpatient elective radical cystectomy. Oncology consulted recommended patient is a poor candidate for neoadjuvant chemotherapy, recommendations to consider cystectomy prior to adjuvant chemo or immunotherapy versus radiation.     Hypercalcemia of malignancy Patient had a calcium of more than 15 on his BMP Continue with IV fluids patient got a dose of zoledronic acid.  Calcium is improving at 13.2 replete BMP in the morning. PTH is 5   Severe protein calorie malnutrition Dietary consulted and recommendations given.   AKI Baseline creatinine around 0.8 admitted with AKI. Creatinine has improved to 1.7. Recommend to continue with IV fluids at this  time.   leukocytosis probably secondary to sepsis.  Hypokalemia Replaced   DVT prophylaxis: (SCDs) Code Status: (Full/code. ) Family Communication: none at bedside.  Disposition:   Status is: Inpatient  Remains inpatient appropriate because:Unsafe d/c plan, IV treatments appropriate due to intensity of illness or inability to take PO and Inpatient level of care appropriate due to severity of illness still hypercalcemic.    Dispo: The patient is from: Home              Anticipated d/c is to: SNF              Anticipated d/c date is: 2 days              Patient currently is not medically stable to d/c.       Consultants:   Urology   Procedures: none.    Antimicrobials: Ciprofloxacin   Subjective: Pt reports persistent back pain, and pain all over the body.   Objective: Vitals:   11/09/20 2210 11/10/20 0203 11/10/20 0625 11/10/20 1440  BP: (!) 84/53 102/62 111/66 110/61  Pulse: 93  95 87  Resp: 15  17 15   Temp: 99.3 F (37.4 C)  97.9 F (36.6 C) 97.8 F (36.6 C)  TempSrc: Oral   Oral  SpO2: 93%  97% 97%  Weight:      Height:        Intake/Output Summary (Last 24 hours) at 11/10/2020 1710 Last data filed at 11/10/2020 1637 Gross per 24 hour  Intake 3701.55 ml  Output 2550 ml  Net 1151.55 ml   Filed Weights   11/03/20 0852  Weight: 50.7 kg    Examination:  General exam: Appears calm and comfortable  Respiratory  system: Clear to auscultation. Respiratory effort normal. Cardiovascular system: S1 & S2 heard, RRR. No JVD,. No pedal edema. Gastrointestinal system: Abdomen is nondistended, soft and nontender.Normal bowel sounds heard. Central nervous system: Alert and oriented. No focal neurological deficits. Extremities: Symmetric 5 x 5 power. Skin: No rashes, lesions or ulcers Psychiatry:  Mood & affect appropriate.     Data Reviewed: I have personally reviewed following labs and imaging studies  CBC: Recent Labs  Lab 11/04/20 0740  11/05/20 0925 11/06/20 0500 11/09/20 1207  WBC 16.4* 17.0* 14.8* 19.7*  NEUTROABS 12.3*  --   --   --   HGB 10.6* 11.0* 10.5* 11.3*  HCT 32.6* 35.7* 33.4* 37.4*  MCV 87.6 92.0 90.5 91.7  PLT 321 316 305 [id number]    Basic Metabolic Panel: Recent Labs  Lab 11/04/20 0740 11/05/20 0925 11/06/20 0500 11/08/20 0432 11/09/20 0453 11/09/20 1207 11/09/20 1940 11/10/20 0708  NA 136 138  --   --   --  136 139 139  K 2.8* 3.6  --   --   --  4.7 4.1 3.7  CL 108 111  --   --   --  102 108 108  CO2 22 20*  --   --   --  25 26 23   GLUCOSE 172* 93  --   --   --  125* 110* 95  BUN 13 14  --   --   --  47* 46* 44*  CREATININE 0.86 0.84   < > 1.24 1.80* 1.95* 2.01* 1.71*  CALCIUM 10.7* 11.1*  --   --   --  >15.0* 14.4* 13.2*   < > = values in this interval not displayed.    GFR: Estimated Creatinine Clearance: 30.9 mL/min (A) (by C-G formula based on SCr of 1.71 mg/dL (H)).  Liver Function Tests: Recent Labs  Lab 11/04/20 0740  AST 14*  ALT 10  ALKPHOS 70  BILITOT 0.3  PROT 5.6*  ALBUMIN 2.0*    CBG: Recent Labs  Lab 11/06/20 1204 11/07/20 0610 11/09/20 0558 11/10/20 0632 11/10/20 0811  GLUCAP 100* 110* 123* 88 90     Recent Results (from the past 240 hour(s))  Blood culture (routine single)     Status: None   Collection Time: 11/03/20  2:53 AM   Specimen: BLOOD LEFT WRIST  Result Value Ref Range Status   Specimen Description   Final    BLOOD LEFT WRIST Performed at [facility] [address] [place], [place] [address]    Special Requests   Final    BOTTLES DRAWN AEROBIC AND ANAEROBIC Blood Culture results may not be optimal due to an inadequate volume of blood received in culture bottles Performed at [facility] [address], [place], [place] [address]    Culture   Final    NO GROWTH 5 DAYS Performed at [facility], [place] [address] [place], [place] [address]    Report Status 11/08/2020 FINAL  Final  Urine culture      Status: Abnormal   Collection Time: 11/03/20  3:30 AM   Specimen: In/Out Cath Urine  Result Value Ref Range Status   Specimen Description   Final    IN/OUT CATH URINE Performed at [facility] [address] [place], [place] [address]    Special Requests   Final    NONE Performed at [facility], [place] [address], [place], [place] [address]  Culture MULTIPLE SPECIES PRESENT, SUGGEST RECOLLECTION (A)  Final   Report Status 11/04/2020 FINAL  Final  Resp Panel by RT-PCR (Flu A&B, Covid) Nasopharyngeal Swab     Status: None   Collection Time: 11/03/20 10:20 AM   Specimen: Nasopharyngeal Swab; Nasopharyngeal(NP) swabs in vial transport medium  Result Value Ref Range Status   SARS Coronavirus 2 by RT PCR NEGATIVE NEGATIVE Final    Comment: (NOTE) SARS-CoV-2 target nucleic acids are NOT DETECTED.  The SARS-CoV-2 RNA is generally detectable in upper respiratory specimens during the acute phase of infection. The lowest concentration of SARS-CoV-2 viral copies this assay can detect is 138 copies/mL. A negative result does not preclude SARS-Cov-2 infection and should not be used as the sole basis for treatment or other patient management decisions. A negative result may occur with  improper specimen collection/handling, submission of specimen other than nasopharyngeal swab, presence of viral mutation(s) within the areas targeted by this assay, and inadequate number of viral copies(<138 copies/mL). A negative result must be combined with clinical observations, patient history, and epidemiological information. The expected result is Negative.  Fact Sheet for Patients:  EntrepreneurPulse.com.au  Fact Sheet for Healthcare Providers:  IncredibleEmployment.be  This test is no t yet approved or cleared by the [place] FDA and  has been authorized for detection and/or diagnosis of SARS-CoV-2 by FDA under an  Emergency Use Authorization (EUA). This EUA will remain  in effect (meaning this test can be used) for the duration of the COVID-19 declaration under Section 564(b)(1) of the Act, 21 U.S.C.section 360bbb-3(b)(1), unless the authorization is terminated  or revoked sooner.       Influenza A by PCR NEGATIVE NEGATIVE Final   Influenza B by PCR NEGATIVE NEGATIVE Final    Comment: (NOTE) The Xpert Xpress SARS-CoV-2/FLU/RSV plus assay is intended as an aid in the diagnosis of influenza from Nasopharyngeal swab specimens and should not be used as a sole basis for treatment. Nasal washings and aspirates are unacceptable for Xpert Xpress SARS-CoV-2/FLU/RSV testing.  Fact Sheet for Patients: EntrepreneurPulse.com.au  Fact Sheet for Healthcare Providers: IncredibleEmployment.be  This test is not yet approved or cleared by the [place] FDA and has been authorized for detection and/or diagnosis of SARS-CoV-2 by FDA under an Emergency Use Authorization (EUA). This EUA will remain in effect (meaning this test can be used) for the duration of the COVID-19 declaration under Section 564(b)(1) of the Act, 21 U.S.C. section 360bbb-3(b)(1), unless the authorization is terminated or revoked.  Performed at [facility], [place] [address], [place], [place] [address]   Culture, Urine     Status: Abnormal   Collection Time: 11/04/20 11:43 AM   Specimen: Urine, Catheterized  Result Value Ref Range Status   Specimen Description   Final    URINE, CATHETERIZED Performed at [facility] [address] [place], [place] [address]    Special Requests   Final    NONE Performed at [facility], [place] [address] [place], [place] [address]    Culture MULTIPLE SPECIES PRESENT, SUGGEST RECOLLECTION (A)  Final   Report Status 11/05/2020 FINAL  Final         Radiology Studies: No results  found.      Scheduled Meds: . (feeding supplement) PROSource Plus  30 mL Oral BID WC  . B-complex with vitamin C  1 tablet Oral Daily  . Chlorhexidine Gluconate Cloth  6 each Topical Daily  . ciprofloxacin  500 mg Oral Q  breakfast  . feeding supplement  237 mL Oral TID BM  . folic acid  1 mg Oral Daily  . hydrocerin   Topical BID  . nicotine  14 mg Transdermal Daily  . polyethylene glycol  17 g Oral BID  . sodium chloride flush  10-40 mL Intracatheter Q12H  . sodium chloride flush  10-40 mL Intracatheter Q12H  . cyanocobalamin  1,000 mcg Oral Daily   Continuous Infusions: . sodium chloride    . sodium chloride 150 mL/hr at 11/10/20 2751     LOS: 7 days        [name], MD [facility]   To contact the attending provider between 7A-7P or the covering provider during after hours 7P-7A, please log into the web site [email or web address] and access using universal [place] password for that web site. If you do not have the password, please call the hospital operator.  11/10/2020, 5:10 PM

## 2020-11-11 ENCOUNTER — Inpatient Hospital Stay (HOSPITAL_COMMUNITY): Payer: Medicaid Other

## 2020-11-11 DIAGNOSIS — D649 Anemia, unspecified: Secondary | ICD-10-CM | POA: Diagnosis not present

## 2020-11-11 DIAGNOSIS — R7989 Other specified abnormal findings of blood chemistry: Secondary | ICD-10-CM | POA: Diagnosis not present

## 2020-11-11 DIAGNOSIS — N390 Urinary tract infection, site not specified: Secondary | ICD-10-CM | POA: Diagnosis not present

## 2020-11-11 DIAGNOSIS — C679 Malignant neoplasm of bladder, unspecified: Secondary | ICD-10-CM | POA: Diagnosis not present

## 2020-11-11 LAB — COMPREHENSIVE METABOLIC PANEL
ALT: 19 U/L (ref 0–44)
AST: 31 U/L (ref 15–41)
Albumin: 2.1 g/dL — ABNORMAL LOW (ref 3.5–5.0)
Alkaline Phosphatase: 98 U/L (ref 38–126)
Anion gap: 7 (ref 5–15)
BUN: 31 mg/dL — ABNORMAL HIGH (ref 8–23)
CO2: 21 mmol/L — ABNORMAL LOW (ref 22–32)
Calcium: 12 mg/dL — ABNORMAL HIGH (ref 8.9–10.3)
Chloride: 112 mmol/L — ABNORMAL HIGH (ref 98–111)
Creatinine, Ser: 1.35 mg/dL — ABNORMAL HIGH (ref 0.61–1.24)
GFR, Estimated: 58 mL/min — ABNORMAL LOW (ref >60)
Glucose, Bld: 95 mg/dL (ref 70–99)
Potassium: 3.7 mmol/L (ref 3.5–5.1)
Sodium: 140 mmol/L (ref 135–145)
Total Bilirubin: 0.6 mg/dL (ref 0.3–1.2)
Total Protein: 6.4 g/dL — ABNORMAL LOW (ref 6.5–8.1)

## 2020-11-11 LAB — CREATININE, SERUM
Creatinine, Ser: 1.44 mg/dL — ABNORMAL HIGH (ref 0.61–1.24)
GFR, Estimated: 54 mL/min — ABNORMAL LOW (ref >60)

## 2020-11-11 LAB — GLUCOSE, CAPILLARY: Glucose-Capillary: 103 mg/dL — ABNORMAL HIGH (ref 70–99)

## 2020-11-11 NOTE — Plan of Care (Signed)

## 2020-11-11 NOTE — Progress Notes (Signed)
Nutrition Follow-up  DOCUMENTATION CODES:   Severe malnutrition in context of chronic illness,Underweight  INTERVENTION:   -Prosource Plus PO BID, each provides 100 kcals and 15g protein  -Ensure Enlive po TID, each supplement provides 350 kcal and 20 grams of protein   NUTRITION DIAGNOSIS:   Severe Malnutrition related to chronic illness,cancer and cancer related treatments as evidenced by severe fat depletion,severe muscle depletion,percent weight loss.  Ongoing.  GOAL:   Patient will meet greater than or equal to 90% of their needs  Progressing.  MONITOR:   PO intake,Supplement acceptance,Labs,I & O's,Weight trends  ASSESSMENT:   65 y.o. 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 Patient presented secondary to abdominal pain and found to have evidence of sepsis possibly secondary to necrotic bladder mass.  No PO documented other than fluid intakes.  Pt is taking Prosource and accepting 2/3 of Ensures ordered.  Per oncology note, pt with hypercalcemia likely related to malignancy vs bone mets.   Admission weight: 111 lbs. Now new weights for admission.  Medications: B complex w/ vitamin C, Folic acid, Miralax, Vitamin B-12  Labs reviewed:  CBGs: 90-103  Diet Order:   Diet Order            Diet regular Room service appropriate? Yes; Fluid consistency: Thin  Diet effective now                 EDUCATION NEEDS:   No education needs have been identified at this time  Skin:  Skin Assessment: Reviewed RN Assessment  Last BM:  1/13  Height:   Ht Readings from Last 1 Encounters:  11/02/20 6\' 1"  (1.854 m)    Weight:   Wt Readings from Last 1 Encounters:  11/03/20 50.7 kg   BMI:  Body mass index is 14.74 kg/m.  Estimated Nutritional Needs:   Kcal:  2100-2300  Protein:  100-115g  Fluid:  2.1L/day  Clayton Bibles, MS, RD, LDN Inpatient Clinical  Dietitian Contact information available via Amion

## 2020-11-11 NOTE — Progress Notes (Addendum)
Patient refused wound care treatment. Patient states, "I don't want anyone touching my feet".

## 2020-11-11 NOTE — Plan of Care (Signed)
  Problem: Education: Goal: Knowledge of General Education information will improve Description: Including pain rating scale, medication(s)/side effects and non-pharmacologic comfort measures Outcome: Progressing   Problem: Clinical Measurements: Goal: Respiratory complications will improve Outcome: Progressing Goal: Cardiovascular complication will be avoided Outcome: Progressing   Problem: Coping: Goal: Level of anxiety will decrease Outcome: Progressing   Problem: Elimination: Goal: Will not experience complications related to urinary retention Outcome: Progressing   Problem: Pain Managment: Goal: General experience of comfort will improve Outcome: Progressing   Problem: Safety: Goal: Ability to remain free from injury will improve Outcome: Progressing   Problem: Skin Integrity: Goal: Risk for impaired skin integrity will decrease Outcome: Progressing   

## 2020-11-11 NOTE — Progress Notes (Signed)
OT Cancellation Note  Patient Details Name: Jamie Wilson MRN: [id number] DOB: 28-Nov-1955   Cancelled Treatment:    Reason Eval/Treat Not Completed: Patient declined, no reason specified  Lenward Chancellor 11/11/2020, 12:24 PM

## 2020-11-11 NOTE — Progress Notes (Signed)
[facility] Kitchen   HEMATOLOGY/ONCOLOGY INPATIENT PROGRESS NOTE  Date of Service: 11/11/2020  Inpatient Attending: .[name], MD   SUBJECTIVE  Mr. Heldt was seen in medical oncology follow-up.  Appreciate excellent hospital medicine and urology cares.  Since our last consultation the patient has developed significant hypercalcemia likely related to his malignancy.  His last repeat CT abdomen brought up concerns of possible pelvic nodal metastases and also possible involvement of the bowel along with his bladder. We discussed that his hypercalcemia could suggest development of metastatic disease to bone or could be due to production of PTH RP by his tumor. If he has developed overtly metastatic disease to his bones that would change his goals of treatment.  OBJECTIVE:  PHYSICAL EXAMINATION: . Vitals:   11/10/20 0625 11/10/20 1440 11/10/20 2206 11/11/20 0633  BP: 111/66 110/61 102/65 115/66  Pulse: 95 87 79 90  Resp: 17 15 16 16   Temp: 97.9 F (36.6 C) 97.8 F (36.6 C) 97.7 F (36.5 C) 97.8 F (36.6 C)  TempSrc:  Oral Oral Oral  SpO2: 97% 97% 96% 95%  Weight:      Height:       Filed Weights   11/03/20 0852  Weight: 111 lb 11.2 oz (50.7 kg)   .Body mass index is 14.74 kg/m.  GENERAL:alert, in no acute distress and comfortable, emaciated appearing gentleman SKIN: No acute rashes  EYES: normal, conjunctiva are pink and non-injected, sclera clear OROPHARYNX:no exudate, no erythema and lips, buccal mucosa, and tongue normal  NECK: supple, no JVD LYMPH:  no palpable lymphadenopathy in the cervical, axillary or inguinal LUNGS: clear to auscultation with normal respiratory effort HEART: regular rate & rhythm,  no murmurs and no lower extremity edema ABDOMEN: abdomen soft, mild tenderness to palpation in the lower abdomen, Foley's catheter in situ  PSYCH: alert & oriented x 3 with fluent speech NEURO: no focal motor/sensory deficits  MEDICAL HISTORY:  Past Medical History:  Diagnosis  Date  . Bladder cancer ([place])   . 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 Toe infection     SURGICAL HISTORY: Past Surgical History:  Procedure Laterality Date  . ABDOMINAL SURGERY    . CYSTOSCOPY/URETEROSCOPY/HOLMIUM LASER/STENT PLACEMENT Right 10/06/2020   Procedure: CYSTO;  Surgeon: Irine Seal, MD;  Location: [facility];  Service: Urology;  Laterality: Right;  . TRANSURETHRAL RESECTION OF BLADDER TUMOR N/A 10/06/2020   Procedure: POSSIBLE TRANSURETHRAL RESECTION OF BLADDER TUMOR (TURBT);  Surgeon: Irine Seal, MD;  Location: [facility];  Service: Urology;  Laterality: N/A;    SOCIAL HISTORY: Social History   Socioeconomic History  . Marital status: Divorced    Spouse name: Not on file  . Number of children: Not on file  . Years of education: Not on file  . Highest education level: Not on file  Occupational History  . Not on file  Tobacco Use  . Smoking status: Current Some Day Smoker  . Smokeless tobacco: Never Used  Substance and Sexual Activity  . Alcohol use: Yes  . Drug use: Yes    Types: Marijuana  . Sexual activity: Not on file  Other Topics Concern  . Not on file  Social History Narrative  . Not on file   Social Determinants of Health   Financial Resource Strain: Not on file  Food Insecurity: Not on file  Transportation Needs: Not on file  Physical Activity: Not on file  Stress: Not on file  Social Connections: Not on file  Intimate Partner Violence: Not on file  FAMILY HISTORY: Family History  Problem Relation Age of Onset  . CAD Neg Hx   . Heart failure Neg Hx     ALLERGIES:  is allergic to silvadene [silver sulfadiazine].  MEDICATIONS:  Scheduled Meds: . (feeding supplement) PROSource Plus  30 mL Oral BID WC  . B-complex with vitamin C  1 tablet Oral Daily  . Chlorhexidine Gluconate Cloth  6 each Topical Daily  . ciprofloxacin  500 mg Oral Q breakfast  . feeding supplement  237 mL Oral TID BM  . folic acid  1 mg Oral Daily  . hydrocerin   Topical BID  .  nicotine  14 mg Transdermal Daily  . polyethylene glycol  17 g Oral BID  . sodium chloride flush  10-40 mL Intracatheter Q12H  . sodium chloride flush  10-40 mL Intracatheter Q12H  . cyanocobalamin  1,000 mcg Oral Daily   Continuous Infusions: . sodium chloride    . sodium chloride 150 mL/hr at 11/11/20 0718   PRN Meds:.acetaminophen **OR** acetaminophen, hydrOXYzine, ondansetron **OR** ondansetron (ZOFRAN) IV, oxyCODONE-acetaminophen, sodium chloride flush, sodium chloride flush  REVIEW OF SYSTEMS:    10 Point review of Systems was done is negative except as noted above.   LABORATORY DATA:  I have reviewed the data as listed  . CBC Latest Ref Rng & Units 11/09/2020 11/06/2020 11/05/2020  WBC 4.0 - 10.5 K/uL 19.7(H) 14.8(H) 17.0(H)  Hemoglobin 13.0 - 17.0 g/dL 11.3(L) 10.5(L) 11.0(L)  Hematocrit 39.0 - 52.0 % 37.4(L) 33.4(L) 35.7(L)  Platelets 150 - 400 K/uL 322 305 316    . CMP Latest Ref Rng & Units 11/11/2020 11/10/2020 11/09/2020  Glucose 70 - 99 mg/dL - 95 110(H)  BUN 8 - 23 mg/dL - 44(H) 46(H)  Creatinine 0.61 - 1.24 mg/dL 1.44(H) 1.71(H) 2.01(H)  Sodium 135 - 145 mmol/L - 139 139  Potassium 3.5 - 5.1 mmol/L - 3.7 4.1  Chloride 98 - 111 mmol/L - 108 108  CO2 22 - 32 mmol/L - 23 26  Calcium 8.9 - 10.3 mg/dL - 13.2(HH) 14.4(HH)  Total Protein 6.5 - 8.1 g/dL - - -  Total Bilirubin 0.3 - 1.2 mg/dL - - -  Alkaline Phos 38 - 126 U/L - - -  AST 15 - 41 U/L - - -  ALT 0 - 44 U/L - - -     RADIOGRAPHIC STUDIES: I have personally reviewed the radiological images as listed and agreed with the findings in the report. CT ABDOMEN PELVIS W CONTRAST  Result Date: 11/03/2020 CLINICAL DATA:  Abdominal pain.  Bladder cancer. EXAM: CT ABDOMEN AND PELVIS WITH CONTRAST TECHNIQUE: Multidetector CT imaging of the abdomen and pelvis was performed using the standard protocol following bolus administration of intravenous contrast. CONTRAST:  65mL OMNIPAQUE IOHEXOL 300 MG/ML  SOLN COMPARISON:   10/08/2020 FINDINGS: Lower chest:  No acute finding Hepatobiliary: Multiple hepatic cysts measuring up to 5.4 cm in the right upper lobe.No evidence of biliary obstruction or stone. Pancreas: Unremarkable. Spleen: Unremarkable. Adrenals/Urinary Tract: Negative adrenals. No hydronephrosis or ureteral stone. Multiple renal calculi, more numerous on the right where the largest stone measures 14 mm. Right renal cortical scarring asymmetric to the right. Bulky necrotic tumor at the dome of the bladder with high-density in the bladder lumen likely reflecting both clot and calcification. The confluent mass measures at least 9 cm craniocaudal. Foley catheter is in expected position. The mass is intimately associated with small bowel loops in the pelvis, without bowel obstruction or demonstrable fistulization. Stomach/Bowel:  Descending colostomy. No bowel obstruction or visible inflammation. Vascular/Lymphatic: No acute vascular abnormality. Atherosclerosis of the aorta and iliacs. There are a few pelvic lymph nodes which show prominent enhancement but are strictly nonenlarged. An 8 mm node along the right iliac chain on 2:66 is an example. No para-aortic adenopathy is seen. Reproductive:Unremarkable Other: No ascites or pneumoperitoneum. Musculoskeletal: No acute abnormalities. IMPRESSION: 1. Bulky bladder mass with progression from 10/08/2020. The mass appears necrotic with complex material in the bladder lumen. Foley catheter is normally positioned. 2. The mass is intimately associated with pelvic small bowel loops. Fistulization is possible, especially given the degree of bladder gas, but not clearly demonstrated. Please correlate with Foley catheter output. No small bowel obstruction. 3. Possible early nodal metastatic disease in the pelvis. 4. Right renal atrophy and multiple calculi. Electronically Signed   By: Monte Fantasia M.D.   On: 11/03/2020 07:57   DG Chest Port 1 View  Result Date: 11/03/2020 CLINICAL  DATA:  Bladder pain and questionable sepsis. EXAM: PORTABLE CHEST 1 VIEW COMPARISON:  October 10, 2020 FINDINGS: Very mild, stable biapical atelectasis and scarring is seen. There is no evidence of acute infiltrate, pleural effusion or pneumothorax. The heart size and mediastinal contours are within normal limits. The visualized skeletal structures are unremarkable. IMPRESSION: Stable exam without acute or active cardiopulmonary disease. Electronically Signed   By: Virgina Norfolk M.D.   On: 11/03/2020 00:12   DG Foot 2 Views Left  Result Date: 11/05/2020 CLINICAL DATA:  Pain.  No injury.  Possible gout. EXAM: LEFT FOOT - 2 VIEW COMPARISON:  None. FINDINGS: There is no evidence of fracture or dislocation. There is no evidence of arthropathy or other focal bone abnormality. Soft tissues are unremarkable. IMPRESSION: Negative. Electronically Signed   By: Dorise Bullion III M.D   On: 11/05/2020 10:36   DG Foot 2 Views Right  Result Date: 11/05/2020 CLINICAL DATA:  Pain.  No trauma. EXAM: RIGHT FOOT - 2 VIEW COMPARISON:  None. FINDINGS: There is no evidence of fracture or dislocation. There is no evidence of arthropathy or other focal bone abnormality. Soft tissues are unremarkable. IMPRESSION: Negative. 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  2) newly noted severe hypercalcemia of malignancy -brings up concern for metastatic disease to bones.  Could also be from the squamous component of his tumor related to PTH RP production. PTH levels are appropriately suppressed.  -10/02/2020 CT renal stone study- "Heterogeneous soft tissue mass extending from the bladder superiorlywhich appears to likely have a fistulous connection to the  overlying small bowel and colonic loops, concerning for bladder neoplasm." -Status post TURBT on 10/06/2020 -CT abdomen/pelvis 10/08/2020-"1. Irregular thickening of the bladder wall with enhancement. Gas and nonenhancing material centrally within the bladder. Bladder neoplasm is certainly a consideration." -CT chest with contrast 10/11/2020- "1. Borderline enlarged mediastinal and hilar lymph nodes. These are likely related to the patient has emphysema but attention on future studies or PET-CT may be helpful for further evaluation. 2. No findings for pulmonary metastatic disease." -CT abdomen/pelvis with contrast 11/03/2020- "1. Bulky bladder mass with progression from 10/08/2020. The mass appears necrotic with complex material in the bladder lumen. Foley catheter is normally positioned. 2. The mass is intimately associated  with pelvic small bowel loops. Fistulization is possible, especially given the degree of bladder gas, but not clearly demonstrated. Please correlate with Foley catheter output. No small bowel obstruction. 3. Possible early nodal metastatic disease in the pelvis. 4. Right renal atrophy and multiple calculi."  2) sepsis secondary to UTI and likely infected necrotic bladder mass -- resolving on antibiotics.  3) Severe protein calorie malnutrition  4) normocytic anemia due to malignancy and hematuria  5) Poor performance status ECOG PS 3  PLAN -Patient was deemed not to be a good candidate for neoadjuvant chemotherapy due to his poor performance status, recurrent sepsis related to urinary tract infections, severe protein calorie malnutrition, poor social support and inability to have close medical oncology follow-up. -Currently also has some local complications of possible fistula and adhesions of his urinary bladder to bowels. -His newly developed hypercalcemia suggests concern for disease progression and possible bone metastases -He has received Zometa and IV fluids per hospital  medicine for treatment of hypercalcemia of malignancy. -Would ideally prefer PET CT scan but since that cannot be done as inpatient will get whole-body bone survey to look for overt bony metastatic disease. -The presence of obvious bone metastases would change the goals of his care to palliative considerations. -Social worker is involved for placement considerations. -Urology involved to determine candidacy for surgery if there is no overt metastatic disease. -If the patient has obvious metastatic disease -would need to consider palliative chemotherapy versus immunotherapy versus best supportive cares.   I spent 30 minutes counseling the patient face to face. The total time spent in the appointment was 40 minutes and more than 50% was on counseling and direct patient cares.    Sullivan Lone MD [place] AAHIVMS [facility] Hematology/Oncology Physician [facility]  (Office):       [phone] (Work cell):  [phone] (Fax):           [phone]

## 2020-11-11 NOTE — Progress Notes (Signed)
PROGRESS NOTE    Jamie Wilson  GQQ:[id number] DOB: 1955/12/27 DOA: 11/02/2020 PCP: Patient, [name]   Chief Complaint  Patient presents with  . Pain  . Bladder Cancer    Brief Narrative: Jamie Wilson is a 65 y.o. 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 Patient presented secondary to abdominal pain and found to have evidence of sepsis possibly secondary to necrotic bladder mass. Empiric antibiotics initiated. Urology and oncology consulted aand recommendations given. Hospital course complicated by hypercalcemia,. Improving.  Pt seen and examined at bedside. He reports his back pain has improved, has persistent leg pain. Bone scan does not show any lytic or blastic bone lesions suspicious for bone mets.   Assessment & Plan:   Active Problems:   Malnutrition ([place])   Sepsis ([place])   Protein-calorie malnutrition, severe   Lower urinary tract infectious disease   Palliative care by specialist   Goals of care, counseling/discussion    Sepsis probably from urinary source. CT of the abdomen and pelvis significant for necrotic bladder mass. Patient was empirically started on broad-spectrum IV antibiotics.  Urine culture shows multiple species of bacteria.  Blood cultures negative so far. Urology consulted recommended to continue with ciprofloxacin for total of 2 weeks of antibiotics.     Urothelial carcinoma of the bladder Urology consulted and Foley catheter placed.  Recommend outpatient elective radical cystectomy. Oncology consulted recommended patient is a poor candidate for neoadjuvant chemotherapy, recommendations to consider cystectomy prior to adjuvant chemo or immunotherapy versus radiation. No interventions at this time.      Hypercalcemia of malignancy Patient had a calcium of more than 15 on his BMP Continue with IV fluids patient got a dose of zoledronic acid.  Calcium is  improving , currently at 12 this am,  repeat BMP in the morning. PTH is 5   Severe protein calorie malnutrition Dietary consulted and recommendations given.   AKI Baseline creatinine around 0.8 admitted with AKI. Creatinine has improved to 1.4. Recommend to continue with IV fluids at this time.   leukocytosis probably secondary to sepsis.  Hypokalemia Replaced  Mild anemia of chronic disease.  Hemoglobin stable around 11.    DVT prophylaxis: (SCDs) Code Status: (Full/code. ) Family Communication: none at bedside.  Disposition:   Status is: Inpatient  Remains inpatient appropriate because:Unsafe d/c plan, IV treatments appropriate due to intensity of illness or inability to take PO and Inpatient level of care appropriate due to severity of illness still hypercalcemic.    Dispo: The patient is from: Home              Anticipated d/c is to: SNF              Anticipated d/c date is: 2 days              Patient currently is not medically stable to d/c.       Consultants:   Urology   Procedures: none.    Antimicrobials: Ciprofloxacin   Subjective: Back pain has improved. Leg pain present.   Objective: Vitals:   11/10/20 1440 11/10/20 2206 11/11/20 0633 11/11/20 1346  BP: 110/61 102/65 115/66 136/78  Pulse: 87 79 90 89  Resp: _0 Temp: 97.8 F (36.6 C) 97.7 F (36.5 C) 97.8 F (36.6 C) 98 F (36.7 C)  TempSrc: Oral Oral Oral   SpO2: 97% 96% 95% 97%  Weight:  Height:        Intake/Output Summary (Last 24 hours) at 11/11/2020 1554 Last data filed at 11/11/2020 1407 Gross per 24 hour  Intake 6379.81 ml  Output 4550 ml  Net 1829.81 ml   Filed Weights   11/03/20 0852  Weight: 50.7 kg    Examination:  General exam: alert and comfortable, not in distress.  Respiratory system: clear to auscultation, no wheezing heard.  Cardiovascular system: S1 , S2 heard, RRR, no JVD,  Gastrointestinal system: Abdomen is soft, NT ND BS+ Central  nervous system: Alert and oriented, non focal.  Extremities: no pedal edema.  Skin: no rashes seen.  Psychiatry:  Mood is appropriate.     Data Reviewed: I have personally reviewed following labs and imaging studies  CBC: Recent Labs  Lab 11/05/20 0925 11/06/20 0500 11/09/20 1207  WBC 17.0* 14.8* 19.7*  HGB 11.0* 10.5* 11.3*  HCT 35.7* 33.4* 37.4*  MCV 92.0 90.5 91.7  PLT 316 305 314    Basic Metabolic Panel: Recent Labs  Lab 11/05/20 0925 11/06/20 0500 11/09/20 0453 11/09/20 1207 11/09/20 1940 11/10/20 0708 11/11/20 0551  NA 138  --   --  136 139 139 140  K 3.6  --   --  4.7 4.1 3.7 3.7  CL 111  --   --  102 108 108 112*  CO2 20*  --   --  _0 21*  GLUCOSE 93  --   --  125* 110* 95 95  BUN 14  --   --  47* 46* 44* 31*  CREATININE 0.84   < > 1.80* 1.95* 2.01* 1.71* 1.35*  1.44*  CALCIUM 11.1*  --   --  >15.0* 14.4* 13.2* 12.0*   < > = values in this interval not displayed.    GFR: Estimated Creatinine Clearance: 36.7 mL/min (A) (by C-G formula based on SCr of 1.44 mg/dL (H)).  Liver Function Tests: Recent Labs  Lab 11/11/20 0551  AST 31  ALT 19  ALKPHOS 98  BILITOT 0.6  PROT 6.4*  ALBUMIN 2.1*    CBG: Recent Labs  Lab 11/07/20 0610 11/09/20 0558 11/10/20 0632 11/10/20 0811 11/11/20 0422  GLUCAP 110* 123* 88 90 103*     Recent Results (from the past 240 hour(s))  Blood culture (routine single)     Status: None   Collection Time: 11/03/20  2:53 AM   Specimen: BLOOD LEFT WRIST  Result Value Ref Range Status   Specimen Description   Final    BLOOD LEFT WRIST Performed at [facility] [address] [place], [place] [address]    Special Requests   Final    BOTTLES DRAWN AEROBIC AND ANAEROBIC Blood Culture results may not be optimal due to an inadequate volume of blood received in culture bottles Performed at [facility] [address] [place], [place] [address]    Culture   Final    NO GROWTH 5  DAYS Performed at [facility], [place] [address], [place], [place] [address]    Report Status 11/08/2020 FINAL  Final  Urine culture     Status: Abnormal   Collection Time: 11/03/20  3:30 AM   Specimen: In/Out Cath Urine  Result Value Ref Range Status   Specimen Description   Final    IN/OUT CATH URINE Performed at [facility] [address], [place], [place] [address]    Special Requests   Final    NONE  Performed at [facility], [place] [address] [place], [place] [address]    Culture MULTIPLE SPECIES PRESENT, SUGGEST RECOLLECTION (A)  Final   Report Status 11/04/2020 FINAL  Final  Resp Panel by RT-PCR (Flu A&B, Covid) Nasopharyngeal Swab     Status: None   Collection Time: 11/03/20 10:20 AM   Specimen: Nasopharyngeal Swab; Nasopharyngeal(NP) swabs in vial transport medium  Result Value Ref Range Status   SARS Coronavirus 2 by RT PCR NEGATIVE NEGATIVE Final    Comment: (NOTE) SARS-CoV-2 target nucleic acids are NOT DETECTED.  The SARS-CoV-2 RNA is generally detectable in upper respiratory specimens during the acute phase of infection. The lowest concentration of SARS-CoV-2 viral copies this assay can detect is 138 copies/mL. A negative result does not preclude SARS-Cov-2 infection and should not be used as the sole basis for treatment or other patient management decisions. A negative result may occur with  improper specimen collection/handling, submission of specimen other than nasopharyngeal swab, presence of viral mutation(s) within the areas targeted by this assay, and inadequate number of viral copies(<138 copies/mL). A negative result must be combined with clinical observations, patient history, and epidemiological information. The expected result is Negative.  Fact Sheet for Patients:  EntrepreneurPulse.com.au  Fact Sheet for Healthcare Providers:  IncredibleEmployment.be  This test is no  t yet approved or cleared by the [place] FDA and  has been authorized for detection and/or diagnosis of SARS-CoV-2 by FDA under an Emergency Use Authorization (EUA). This EUA will remain  in effect (meaning this test can be used) for the duration of the COVID-19 declaration under Section 564(b)(1) of the Act, 21 U.S.C.section 360bbb-3(b)(1), unless the authorization is terminated  or revoked sooner.       Influenza A by PCR NEGATIVE NEGATIVE Final   Influenza B by PCR NEGATIVE NEGATIVE Final    Comment: (NOTE) The Xpert Xpress SARS-CoV-2/FLU/RSV plus assay is intended as an aid in the diagnosis of influenza from Nasopharyngeal swab specimens and should not be used as a sole basis for treatment. Nasal washings and aspirates are unacceptable for Xpert Xpress SARS-CoV-2/FLU/RSV testing.  Fact Sheet for Patients: EntrepreneurPulse.com.au  Fact Sheet for Healthcare Providers: IncredibleEmployment.be  This test is not yet approved or cleared by the [place] FDA and has been authorized for detection and/or diagnosis of SARS-CoV-2 by FDA under an Emergency Use Authorization (EUA). This EUA will remain in effect (meaning this test can be used) for the duration of the COVID-19 declaration under Section 564(b)(1) of the Act, 21 U.S.C. section 360bbb-3(b)(1), unless the authorization is terminated or revoked.  Performed at [facility], [place] [address], [place], [place] [address]   Culture, Urine     Status: Abnormal   Collection Time: 11/04/20 11:43 AM   Specimen: Urine, Catheterized  Result Value Ref Range Status   Specimen Description   Final    URINE, CATHETERIZED Performed at [facility] [address] [place], [place] [address]    Special Requests   Final    NONE Performed at [facility], [place] [address], [place], [place] [address]    Culture MULTIPLE SPECIES PRESENT,  SUGGEST RECOLLECTION (A)  Final   Report Status 11/05/2020 FINAL  Final         Radiology Studies: DG Bone Survey Met  Result Date: 11/11/2020 CLINICAL DATA:  Bladder carcinoma EXAM: METASTATIC BONE SURVEY COMPARISON:  CT abdomen and pelvis November 03, 2020; chest radiograph November 03, 2020 FINDINGS: Skull: No  blastic or lytic bone lesions.  Sella appears normal. Cervical spine: No blastic or lytic bone lesions. Foci of degenerative type change noted. Thoracic spine: No blastic or lytic bone lesions. Chest: Scarring lateral right base. Lungs otherwise clear. Heart size normal. No blastic or lytic bone lesions. Lumbar spine: No blastic or lytic bone lesions. Pelvis: No blastic or lytic bone lesions evident. Right femur: No blastic or lytic bone lesions. No abnormal periosteal reaction. Left femur: No blastic or lytic bone lesions. No abnormal periosteal reaction. Right tibia and fibula: No blastic or lytic bone lesions. No abnormal periosteal reaction. Left tibia and fibula: No blastic or lytic bone lesions. No abnormal periosteal reaction. Right shoulder and humerus: No blastic or lytic bone lesions. No abnormal periosteal reaction. Left shoulder and humerus: No blastic or lytic bone lesions. No abnormal periosteal reaction. Right forearm: No blastic or lytic bone lesions. No abnormal periosteal reaction. Left forearm: No blastic or lytic bone lesions. No abnormal periosteal reaction. IMPRESSION: No blastic or lytic bone lesions. No findings suggesting bony metastatic disease. Electronically Signed   By: Lowella Grip III M.D.   On: 11/11/2020 11:54        Scheduled Meds: . (feeding supplement) PROSource Plus  30 mL Oral BID WC  . B-complex with vitamin C  1 tablet Oral Daily  . Chlorhexidine Gluconate Cloth  6 each Topical Daily  . ciprofloxacin  500 mg Oral Q breakfast  . feeding supplement  237 mL Oral TID BM  . folic acid  1 mg Oral Daily  . hydrocerin   Topical BID  . nicotine  14  mg Transdermal Daily  . polyethylene glycol  17 g Oral BID  . cyanocobalamin  1,000 mcg Oral Daily   Continuous Infusions: . sodium chloride    . sodium chloride 150 mL/hr at 11/11/20 1357     LOS: 8 days        [name], MD [facility]   To contact the attending provider between 7A-7P or the covering provider during after hours 7P-7A, please log into the web site [email or web address] and access using universal [place] password for that web site. If you do not have the password, please call the hospital operator.  11/11/2020, 3:54 PM

## 2020-11-12 DIAGNOSIS — N390 Urinary tract infection, site not specified: Secondary | ICD-10-CM | POA: Diagnosis not present

## 2020-11-12 DIAGNOSIS — R7989 Other specified abnormal findings of blood chemistry: Secondary | ICD-10-CM | POA: Diagnosis not present

## 2020-11-12 DIAGNOSIS — D649 Anemia, unspecified: Secondary | ICD-10-CM | POA: Diagnosis not present

## 2020-11-12 DIAGNOSIS — C679 Malignant neoplasm of bladder, unspecified: Secondary | ICD-10-CM | POA: Diagnosis not present

## 2020-11-12 LAB — BASIC METABOLIC PANEL
Anion gap: 9 (ref 5–15)
BUN: 23 mg/dL (ref 8–23)
CO2: 20 mmol/L — ABNORMAL LOW (ref 22–32)
Calcium: 10.3 mg/dL (ref 8.9–10.3)
Chloride: 114 mmol/L — ABNORMAL HIGH (ref 98–111)
Creatinine, Ser: 1.2 mg/dL (ref 0.61–1.24)
GFR, Estimated: >60 mL/min (ref >60)
Glucose, Bld: 82 mg/dL (ref 70–99)
Potassium: 3 mmol/L — ABNORMAL LOW (ref 3.5–5.1)
Sodium: 143 mmol/L (ref 135–145)

## 2020-11-12 LAB — CREATININE, SERUM
Creatinine, Ser: 1.24 mg/dL (ref 0.61–1.24)
GFR, Estimated: >60 mL/min (ref >60)

## 2020-11-12 LAB — GLUCOSE, CAPILLARY: Glucose-Capillary: 111 mg/dL — ABNORMAL HIGH (ref 70–99)

## 2020-11-12 MED ORDER — POTASSIUM CHLORIDE CRYS ER 20 MEQ PO TBCR
40.0000 meq | EXTENDED_RELEASE_TABLET | Freq: Once | ORAL | Status: AC
  Administered 2020-11-12: 40 meq via ORAL
  Filled 2020-11-12: qty 2

## 2020-11-12 MED ORDER — CIPROFLOXACIN HCL 500 MG PO TABS
500.0000 mg | ORAL_TABLET | Freq: Two times a day (BID) | ORAL | Status: AC
  Administered 2020-11-12 – 2020-11-16 (×8): 500 mg via ORAL
  Filled 2020-11-12 (×11): qty 1

## 2020-11-12 NOTE — Progress Notes (Signed)
PROGRESS NOTE    Jamie Wilson  QQI:[id number] DOB: 1956-01-06 DOA: 11/02/2020 PCP: Patient, [name]   Chief Complaint  Patient presents with  . Pain  . Bladder Cancer    Brief Narrative: Jamie Wilson is a 65 y.o. 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 Patient presented secondary to abdominal pain and found to have evidence of sepsis possibly secondary to necrotic bladder mass. Empiric antibiotics initiated. Urology and oncology consulted aand recommendations given. Hospital course complicated by hypercalcemia,. Improving.  Pt seen and examined at bedside. He reports his back pain has improved, has persistent leg pain. Bone scan does not show any lytic or blastic bone lesions suspicious for bone mets.  Pt seen and examined at bedside, reports his pain is minimal today.   Assessment & Plan:   Active Problems:   Malnutrition ([place])   Sepsis ([place])   Protein-calorie malnutrition, severe   Lower urinary tract infectious disease   Palliative care by specialist   Goals of care, counseling/discussion    Sepsis probably from urinary source. CT of the abdomen and pelvis significant for necrotic bladder mass. Patient was empirically started on broad-spectrum IV antibiotics.  Urine culture shows multiple species of bacteria.  Blood cultures negative so far. Urology consulted recommended to continue with ciprofloxacin for total of 2 weeks of antibiotics.  Last day of antibiotics on 11/17/20.    Urothelial carcinoma of the bladder Urology consulted and Foley catheter placed.  Recommend outpatient elective radical cystectomy. Oncology consulted recommended patient is a poor candidate for neoadjuvant chemotherapy, recommendations to consider cystectomy prior to adjuvant chemo or immunotherapy versus radiation. No interventions at this time.      Hypercalcemia of malignancy Patient had a calcium  of more than 15 on his BMP Continue with IV fluids patient got a dose of zoledronic acid.  Calcium is improving , currently at 10.3 this am,  repeat BMP in the morning. PTH is 5   Severe protein calorie malnutrition Dietary consulted and recommendations given.   AKI Baseline creatinine around 0.8 admitted with AKI. Creatinine has improved to 1.2 Recommend to continue with IV fluids at this time.   leukocytosis probably secondary to sepsis.  Hypokalemia Replaced  Mild anemia of chronic disease.  Hemoglobin stable around 11.    DVT prophylaxis: (SCDs) Code Status: (Full/code. ) Family Communication: none at bedside.  Disposition:   Status is: Inpatient  Remains inpatient appropriate because:Unsafe d/c plan, IV treatments appropriate due to intensity of illness or inability to take PO and Inpatient level of care appropriate due to severity of illness still hypercalcemic.    Dispo: The patient is from: Home              Anticipated d/c is to: SNF              Anticipated d/c date is: 2 days              Patient currently is not medically stable to d/c.       Consultants:   Urology   Procedures: none.    Antimicrobials: Ciprofloxacin   Subjective: Back pain has improved.   Objective: Vitals:   11/11/20 0633 11/11/20 1346 11/11/20 2107 11/12/20 0632  BP: 115/66 136/78 133/71 112/65  Pulse: 90 89 92 83  Resp: $Remo'16 18 20 16  'gIfHk$ Temp: 97.8 F (36.6 C) 98 F (36.7 C) 98.7 F (37.1 C) 98.4 F (36.9 C)  TempSrc:  Oral  Oral Oral  SpO2: 95% 97% 98% 94%  Weight:      Height:        Intake/Output Summary (Last 24 hours) at 11/12/2020 1221 Last data filed at 11/12/2020 0645 Gross per 24 hour  Intake 1807.53 ml  Output 3600 ml  Net -1792.47 ml   Filed Weights   11/03/20 0852  Weight: 50.7 kg    Examination:  General exam: alert and comfortable. Not in distress.  Respiratory system: clear to auscultation, no wheezing or rhonchi.  Cardiovascular system:  S1S2 RRR no JVD.  Gastrointestinal system: Abdomen is soft, non tender non distended, bowel sounds heard.  Central nervous system: Alert and oriented, non focal Extremities: no pedal edema/  Skin: no rashes seen.  Psychiatry:  Mood is appropriate.     Data Reviewed: I have personally reviewed following labs and imaging studies  CBC: Recent Labs  Lab 11/06/20 0500 11/09/20 1207  WBC 14.8* 19.7*  HGB 10.5* 11.3*  HCT 33.4* 37.4*  MCV 90.5 91.7  PLT 305 768    Basic Metabolic Panel: Recent Labs  Lab 11/09/20 1207 11/09/20 1940 11/10/20 0708 11/11/20 0551 11/12/20 0710 11/12/20 0939  NA 136 139 139 140  --  143  K 4.7 4.1 3.7 3.7  --  3.0*  CL 102 108 108 112*  --  114*  CO2 $Re'25 26 23 'WdQ$ 21*  --  20*  GLUCOSE 125* 110* 95 95  --  82  BUN 47* 46* 44* 31*  --  23  CREATININE 1.95* 2.01* 1.71* 1.35*  1.44* 1.24 1.20  CALCIUM >15.0* 14.4* 13.2* 12.0*  --  10.3    GFR: Estimated Creatinine Clearance: 44 mL/min (by C-G formula based on SCr of 1.2 mg/dL).  Liver Function Tests: Recent Labs  Lab 11/11/20 0551  AST 31  ALT 19  ALKPHOS 98  BILITOT 0.6  PROT 6.4*  ALBUMIN 2.1*    CBG: Recent Labs  Lab 11/09/20 0558 11/10/20 0632 11/10/20 0811 11/11/20 0422 11/12/20 0633  GLUCAP 123* 88 90 103* 111*     Recent Results (from the past 240 hour(s))  Blood culture (routine single)     Status: None   Collection Time: 11/03/20  2:53 AM   Specimen: BLOOD LEFT WRIST  Result Value Ref Range Status   Specimen Description   Final    BLOOD LEFT WRIST Performed at [facility] [address], [place], [place] [address]    Special Requests   Final    BOTTLES DRAWN AEROBIC AND ANAEROBIC Blood Culture results may not be optimal due to an inadequate volume of blood received in culture bottles Performed at [facility] [address] [place], [place] [address]    Culture   Final    NO GROWTH 5 DAYS Performed at [facility], [place] [address] [place], [place] [address]    Report Status 11/08/2020 FINAL  Final  Urine culture     Status: Abnormal   Collection Time: 11/03/20  3:30 AM   Specimen: In/Out Cath Urine  Result Value Ref Range Status   Specimen Description   Final    IN/OUT CATH URINE Performed at [facility] [address] [place], [place] [address]    Special Requests   Final    NONE Performed at [facility] [place] [address], [place], [place] [address]    Culture MULTIPLE SPECIES PRESENT, SUGGEST RECOLLECTION (A)  Final   Report  Status 11/04/2020 FINAL  Final  Resp Panel by RT-PCR (Flu A&B, Covid) Nasopharyngeal Swab     Status: None   Collection Time: 11/03/20 10:20 AM   Specimen: Nasopharyngeal Swab; Nasopharyngeal(NP) swabs in vial transport medium  Result Value Ref Range Status   SARS Coronavirus 2 by RT PCR NEGATIVE NEGATIVE Final    Comment: (NOTE) SARS-CoV-2 target nucleic acids are NOT DETECTED.  The SARS-CoV-2 RNA is generally detectable in upper respiratory specimens during the acute phase of infection. The lowest concentration of SARS-CoV-2 viral copies this assay can detect is 138 copies/mL. A negative result does not preclude SARS-Cov-2 infection and should not be used as the sole basis for treatment or other patient management decisions. A negative result may occur with  improper specimen collection/handling, submission of specimen other than nasopharyngeal swab, presence of viral mutation(s) within the areas targeted by this assay, and inadequate number of viral copies(<138 copies/mL). A negative result must be combined with clinical observations, patient history, and epidemiological information. The expected result is Negative.  Fact Sheet for Patients:  EntrepreneurPulse.com.au  Fact Sheet for Healthcare Providers:  IncredibleEmployment.be  This test is no t yet approved or cleared by the [place]  FDA and  has been authorized for detection and/or diagnosis of SARS-CoV-2 by FDA under an Emergency Use Authorization (EUA). This EUA will remain  in effect (meaning this test can be used) for the duration of the COVID-19 declaration under Section 564(b)(1) of the Act, 21 U.S.C.section 360bbb-3(b)(1), unless the authorization is terminated  or revoked sooner.       Influenza A by PCR NEGATIVE NEGATIVE Final   Influenza B by PCR NEGATIVE NEGATIVE Final    Comment: (NOTE) The Xpert Xpress SARS-CoV-2/FLU/RSV plus assay is intended as an aid in the diagnosis of influenza from Nasopharyngeal swab specimens and should not be used as a sole basis for treatment. Nasal washings and aspirates are unacceptable for Xpert Xpress SARS-CoV-2/FLU/RSV testing.  Fact Sheet for Patients: EntrepreneurPulse.com.au  Fact Sheet for Healthcare Providers: IncredibleEmployment.be  This test is not yet approved or cleared by the [place] FDA and has been authorized for detection and/or diagnosis of SARS-CoV-2 by FDA under an Emergency Use Authorization (EUA). This EUA will remain in effect (meaning this test can be used) for the duration of the COVID-19 declaration under Section 564(b)(1) of the Act, 21 U.S.C. section 360bbb-3(b)(1), unless the authorization is terminated or revoked.  Performed at [facility], [place] [address] [place], [place] [address]   Culture, Urine     Status: Abnormal   Collection Time: 11/04/20 11:43 AM   Specimen: Urine, Catheterized  Result Value Ref Range Status   Specimen Description   Final    URINE, CATHETERIZED Performed at [facility] [address] [place], [place] [address]    Special Requests   Final    NONE Performed at [facility], [place] [address] [place], [place] [address]    Culture MULTIPLE SPECIES PRESENT, SUGGEST RECOLLECTION (A)  Final   Report Status  11/05/2020 FINAL  Final         Radiology Studies: DG Bone Survey Met  Result Date: 11/11/2020 CLINICAL DATA:  Bladder carcinoma EXAM: METASTATIC BONE SURVEY COMPARISON:  CT abdomen and pelvis November 03, 2020; chest radiograph November 03, 2020 FINDINGS: Skull: No blastic or lytic bone lesions.  Sella appears normal. Cervical spine: No blastic or lytic bone lesions. Foci of degenerative type change noted. Thoracic spine: No blastic or  lytic bone lesions. Chest: Scarring lateral right base. Lungs otherwise clear. Heart size normal. No blastic or lytic bone lesions. Lumbar spine: No blastic or lytic bone lesions. Pelvis: No blastic or lytic bone lesions evident. Right femur: No blastic or lytic bone lesions. No abnormal periosteal reaction. Left femur: No blastic or lytic bone lesions. No abnormal periosteal reaction. Right tibia and fibula: No blastic or lytic bone lesions. No abnormal periosteal reaction. Left tibia and fibula: No blastic or lytic bone lesions. No abnormal periosteal reaction. Right shoulder and humerus: No blastic or lytic bone lesions. No abnormal periosteal reaction. Left shoulder and humerus: No blastic or lytic bone lesions. No abnormal periosteal reaction. Right forearm: No blastic or lytic bone lesions. No abnormal periosteal reaction. Left forearm: No blastic or lytic bone lesions. No abnormal periosteal reaction. IMPRESSION: No blastic or lytic bone lesions. No findings suggesting bony metastatic disease. Electronically Signed   By: Lowella Grip III M.D.   On: 11/11/2020 11:54        Scheduled Meds: . (feeding supplement) PROSource Plus  30 mL Oral BID WC  . B-complex with vitamin C  1 tablet Oral Daily  . Chlorhexidine Gluconate Cloth  6 each Topical Daily  . ciprofloxacin  500 mg Oral Q breakfast  . feeding supplement  237 mL Oral TID BM  . folic acid  1 mg Oral Daily  . hydrocerin   Topical BID  . nicotine  14 mg Transdermal Daily  . polyethylene glycol  17 g  Oral BID  . potassium chloride  40 mEq Oral Once  . cyanocobalamin  1,000 mcg Oral Daily   Continuous Infusions: . sodium chloride    . sodium chloride 150 mL/hr at 11/11/20 1357     LOS: 9 days        [name], MD [facility]   To contact the attending provider between 7A-7P or the covering provider during after hours 7P-7A, please log into the web site [email or web address] and access using universal [place] password for that web site. If you do not have the password, please call the hospital operator.  11/12/2020, 12:21 PM

## 2020-11-12 NOTE — Progress Notes (Signed)
PHARMACY NOTE:  ANTIMICROBIAL RENAL DOSAGE ADJUSTMENT  Current antimicrobial regimen includes a mismatch between antimicrobial dosage and estimated renal function.  As per policy approved by the Pharmacy & Therapeutics and Medical Executive Committees, the antimicrobial dosage will be adjusted accordingly.  Current antimicrobial dosage:  cipro 500 mg daily  Indication: sepsis22  Renal Function:  Estimated Creatinine Clearance: 44 mL/min (by C-G formula based on SCr of 1.2 mg/dL). []      On intermittent HD, scheduled: []      On CRRT    Antimicrobial dosage has been changed to:  cipro 500 mg bid  Additional comments:   Thank you for allowing pharmacy to be a part of this patient's care.  Napoleon Form, [facility] 11/12/2020 3:45 PM

## 2020-11-13 DIAGNOSIS — D649 Anemia, unspecified: Secondary | ICD-10-CM | POA: Diagnosis not present

## 2020-11-13 DIAGNOSIS — N390 Urinary tract infection, site not specified: Secondary | ICD-10-CM | POA: Diagnosis not present

## 2020-11-13 DIAGNOSIS — C679 Malignant neoplasm of bladder, unspecified: Secondary | ICD-10-CM | POA: Diagnosis not present

## 2020-11-13 DIAGNOSIS — R7989 Other specified abnormal findings of blood chemistry: Secondary | ICD-10-CM | POA: Diagnosis not present

## 2020-11-13 LAB — CBC
HCT: 29.8 % — ABNORMAL LOW (ref 39.0–52.0)
Hemoglobin: 9.1 g/dL — ABNORMAL LOW (ref 13.0–17.0)
MCH: 27.8 pg (ref 26.0–34.0)
MCHC: 30.5 g/dL (ref 30.0–36.0)
MCV: 91.1 fL (ref 80.0–100.0)
Platelets: 284 10*3/uL (ref 150–400)
RBC: 3.27 MIL/uL — ABNORMAL LOW (ref 4.22–5.81)
RDW: 15.1 % (ref 11.5–15.5)
WBC: 14 10*3/uL — ABNORMAL HIGH (ref 4.0–10.5)
nRBC: 0 % (ref 0.0–0.2)

## 2020-11-13 LAB — BASIC METABOLIC PANEL
Anion gap: 7 (ref 5–15)
BUN: 18 mg/dL (ref 8–23)
CO2: 20 mmol/L — ABNORMAL LOW (ref 22–32)
Calcium: 9.4 mg/dL (ref 8.9–10.3)
Chloride: 113 mmol/L — ABNORMAL HIGH (ref 98–111)
Creatinine, Ser: 1.05 mg/dL (ref 0.61–1.24)
GFR, Estimated: >60 mL/min (ref >60)
Glucose, Bld: 86 mg/dL (ref 70–99)
Potassium: 3.2 mmol/L — ABNORMAL LOW (ref 3.5–5.1)
Sodium: 140 mmol/L (ref 135–145)

## 2020-11-13 LAB — GLUCOSE, CAPILLARY
Glucose-Capillary: 111 mg/dL — ABNORMAL HIGH (ref 70–99)
Glucose-Capillary: 111 mg/dL — ABNORMAL HIGH (ref 70–99)
Glucose-Capillary: 119 mg/dL — ABNORMAL HIGH (ref 70–99)

## 2020-11-13 MED ORDER — POTASSIUM CHLORIDE CRYS ER 20 MEQ PO TBCR
40.0000 meq | EXTENDED_RELEASE_TABLET | Freq: Two times a day (BID) | ORAL | Status: AC
  Administered 2020-11-13 (×2): 40 meq via ORAL
  Filled 2020-11-13 (×2): qty 2

## 2020-11-13 NOTE — Progress Notes (Signed)
PROGRESS NOTE    Jamie Wilson  [id number]:[id number] DOB: 08-31-1956 DOA: 11/02/2020 PCP: Patient, [name]   Chief Complaint  Patient presents with  . Pain  . Bladder Cancer    Brief Narrative: Jamie Wilson is a 65 y.o. 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 Patient presented secondary to abdominal pain and found to have evidence of sepsis possibly secondary to necrotic bladder mass. Empiric antibiotics initiated. Urology and oncology consulted aand recommendations given. Hospital course complicated by hypercalcemia,.. Bone scan does not show any lytic or blastic bone lesions suspicious for bone mets.  Pt seen and examined, his hypercalcemia is resolved, his AKI is resolved. Pt is stable for discharge to SNF.  PT tried multiple times to work with the patient, but he has been refusing to get out of bed .    Assessment & Plan:   Active Problems:   Malnutrition ([place])   Sepsis ([place])   Protein-calorie malnutrition, severe   Lower urinary tract infectious disease   Palliative care by specialist   Goals of care, counseling/discussion    Sepsis probably from urinary source. CT of the abdomen and pelvis significant for necrotic bladder mass. Patient was empirically started on broad-spectrum IV antibiotics.  Urine culture shows multiple species of bacteria.  Blood cultures negative so far. Urology consulted recommended to continue with ciprofloxacin for total of 2 weeks of antibiotics.  Last day of antibiotics on 11/17/20.  No changes in meds at this time.    Urothelial carcinoma of the bladder Urology consulted and Foley catheter placed.  Recommend outpatient elective radical cystectomy. Oncology consulted recommended patient is a poor candidate for neoadjuvant chemotherapy, recommendations to consider cystectomy prior to adjuvant chemo or immunotherapy versus radiation. No interventions at this  time.      Hypercalcemia of malignancy Patient had a calcium of more than 15 on his BMP Patient got a dose of zoledronic acid.  Calcium is improving , currently around 9  this am,. Decreased the rate of IV fluids.  PTH is 5   Severe protein calorie malnutrition Dietary consulted and recommendations given.   AKI Baseline creatinine around 0.8 admitted with AKI. Creatinine has improved to 1 Decreased the rate of fluids.    leukocytosis probably secondary to infection, improving.   Hypokalemia Replaced, repeat in am.   Anemia of chronic disease.  Hemoglobin around 9. Today. ? Hemodilution.     DVT prophylaxis: (SCDs) Code Status: (Full/code. ) Family Communication: none at bedside.  Disposition:   Status is: Inpatient  Remains inpatient appropriate because:Unsafe d/c plan  Waiting for bed at SNF.    Dispo: The patient is from: Home              Anticipated d/c is to: SNF              Anticipated d/c date is: 1 day              Patient currently is medically stable to d/c.       Consultants:   Urology   Procedures: none.    Antimicrobials: Ciprofloxacin   Subjective: No new complaints at this time.  Objective: Vitals:   11/12/20 1359 11/12/20 2224 11/13/20 0337 11/13/20 1334  BP: (!) 117/58 137/86 123/71 122/74  Pulse: 85 84 87 83  Resp: 18 18  18   Temp: 98.6 F (37 C) 98.2 F (36.8 C) 98.6 F (37 C) 98 F (36.7 C)  TempSrc: Oral Oral Oral Oral  SpO2: (!) 81% 97% 97% 97%  Weight:      Height:        Intake/Output Summary (Last 24 hours) at 11/13/2020 1351 Last data filed at 11/13/2020 1344 Gross per 24 hour  Intake --  Output 6275 ml  Net -6275 ml   Filed Weights   11/03/20 0852  Weight: 50.7 kg    Examination:  General exam: alert , not in distress.  Respiratory system: air entry fair, no wheezing heard.  Cardiovascular system: S1S2 heard, RRR, no JVD, no pedal edema.  Gastrointestinal system: Abdomen is soft, non tender non  distended, bowel sounds heard.  Central nervous system: Alert and oriented, non focal Extremities: No pedal edema.  Skin: No rashes seen.  Psychiatry:  Mood is appropriate.     Data Reviewed: I have personally reviewed following labs and imaging studies  CBC: Recent Labs  Lab 11/09/20 1207 11/13/20 0512  WBC 19.7* 14.0*  HGB 11.3* 9.1*  HCT 37.4* 29.8*  MCV 91.7 91.1  PLT 322 284    Basic Metabolic Panel: Recent Labs  Lab 11/09/20 1940 11/10/20 0708 11/11/20 0551 11/12/20 0710 11/12/20 0939 11/13/20 0512  NA 139 139 140  --  143 140  K 4.1 3.7 3.7  --  3.0* 3.2*  CL 108 108 112*  --  114* 113*  CO2 26 23 21*  --  20* 20*  GLUCOSE 110* 95 95  --  82 86  BUN 46* 44* 31*  --  23 18  CREATININE 2.01* 1.71* 1.35*  1.44* 1.24 1.20 1.05  CALCIUM 14.4* 13.2* 12.0*  --  10.3 9.4    GFR: Estimated Creatinine Clearance: 50.3 mL/min (by C-G formula based on SCr of 1.05 mg/dL).  Liver Function Tests: Recent Labs  Lab 11/11/20 0551  AST 31  ALT 19  ALKPHOS 98  BILITOT 0.6  PROT 6.4*  ALBUMIN 2.1*    CBG: Recent Labs  Lab 11/10/20 0811 11/11/20 0422 11/12/20 0633 11/13/20 0745 11/13/20 1206  GLUCAP 90 103* 111* 111* 111*     Recent Results (from the past 240 hour(s))  Culture, Urine     Status: Abnormal   Collection Time: 11/04/20 11:43 AM   Specimen: Urine, Catheterized  Result Value Ref Range Status   Specimen Description   Final    URINE, CATHETERIZED Performed at [facility], [address] [place], [place] [address]    Special Requests   Final    NONE Performed at [facility], [address] [place], [place] [address]    Culture MULTIPLE SPECIES PRESENT, SUGGEST RECOLLECTION (A)  Final   Report Status 11/05/2020 FINAL  Final         Radiology Studies: No results found.      Scheduled Meds: . (feeding supplement) PROSource Plus  30 mL Oral BID WC  . B-complex with vitamin C  1 tablet Oral  Daily  . Chlorhexidine Gluconate Cloth  6 each Topical Daily  . ciprofloxacin  500 mg Oral BID  . feeding supplement  237 mL Oral TID BM  . folic acid  1 mg Oral Daily  . hydrocerin   Topical BID  . nicotine  14 mg Transdermal Daily  . polyethylene glycol  17 g Oral BID  . potassium chloride  40 mEq Oral BID  . cyanocobalamin  1,000 mcg Oral Daily   Continuous Infusions: . sodium chloride    . sodium chloride 150 mL/hr  at 11/13/20 1011     LOS: 10 days        [name], MD [facility]   To contact the attending provider between 7A-7P or the covering provider during after hours 7P-7A, please log into the web site [email or web address] and access using universal [place] password for that web site. If you do not have the password, please call the hospital operator.  11/13/2020, 1:51 PM

## 2020-11-13 NOTE — Progress Notes (Signed)
OT Cancellation Note  Patient Details Name: Jamie Wilson MRN: [id number] DOB: 05-21-1956   Cancelled Treatment:    Reason Eval/Treat Not Completed: Pain limiting ability to participate. Patient declined therapy for the 3rd time due to pain. Patient reports he wants therapy to keep checking on him and that he may be able to get up "In a week." Unsure of patient's participation with therapy. May need to sign off.  Sabri Teal L Jenyfer Trawick 11/13/2020, 9:36 AM

## 2020-11-14 DIAGNOSIS — D649 Anemia, unspecified: Secondary | ICD-10-CM | POA: Diagnosis not present

## 2020-11-14 LAB — BASIC METABOLIC PANEL
Anion gap: 10 (ref 5–15)
BUN: 16 mg/dL (ref 8–23)
CO2: 16 mmol/L — ABNORMAL LOW (ref 22–32)
Calcium: 10.1 mg/dL (ref 8.9–10.3)
Chloride: 115 mmol/L — ABNORMAL HIGH (ref 98–111)
Creatinine, Ser: 1.03 mg/dL (ref 0.61–1.24)
GFR, Estimated: >60 mL/min (ref >60)
Glucose, Bld: 102 mg/dL — ABNORMAL HIGH (ref 70–99)
Potassium: 3.9 mmol/L (ref 3.5–5.1)
Sodium: 141 mmol/L (ref 135–145)

## 2020-11-14 LAB — GLUCOSE, CAPILLARY: Glucose-Capillary: 107 mg/dL — ABNORMAL HIGH (ref 70–99)

## 2020-11-14 LAB — CREATININE, SERUM
Creatinine, Ser: 0.96 mg/dL (ref 0.61–1.24)
GFR, Estimated: >60 mL/min (ref >60)

## 2020-11-14 NOTE — Plan of Care (Signed)
  Problem: Pain Managment: Goal: General experience of comfort will improve Outcome: Not Progressing   

## 2020-11-14 NOTE — Progress Notes (Signed)
PROGRESS NOTE    Jamie Wilson  UJW:[id number] DOB: 07-17-1956 DOA: 11/02/2020 PCP: Patient, [name]   Chief Complaint  Patient presents with  . Pain  . Bladder Cancer    Brief Narrative: Jamie Wilson is a 65 y.o. 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 Patient presented secondary to abdominal pain and found to have evidence of sepsis possibly secondary to necrotic bladder mass. Empiric antibiotics initiated. Urology and oncology consulted aand recommendations given. Hospital course complicated by hypercalcemia,.. Bone scan does not show any lytic or blastic bone lesions suspicious for bone mets.  Pt seen and examined, his hypercalcemia is resolved, his AKI is resolved. Pt is stable for discharge to SNF.  PT tried multiple times to work with the patient, but he has been refusing to get out of bed .  Palliative care consulted for goals of care.   Assessment & Plan:   Active Problems:   Malnutrition ([place])   Sepsis ([place])   Protein-calorie malnutrition, severe   Lower urinary tract infectious disease   Palliative care by specialist   Goals of care, counseling/discussion    Sepsis probably from urinary source. CT of the abdomen and pelvis significant for necrotic bladder mass. Patient was empirically started on broad-spectrum IV antibiotics.  Urine culture shows multiple species of bacteria.  Blood cultures negative so far. Urology consulted recommended to continue with ciprofloxacin for total of 2 weeks of antibiotics.  Last day of antibiotics on 11/17/20.  No changes in meds at this time.    Urothelial carcinoma of the bladder Urology consulted and Foley catheter placed.  Recommend outpatient elective radical cystectomy. Oncology consulted recommended patient is a poor candidate for neoadjuvant chemotherapy, recommendations to consider cystectomy prior to adjuvant chemo or immunotherapy  versus radiation. No interventions at this time.  No metastasis to bones. Appreciate oncology recommendations.      Hypercalcemia of malignancy Patient had a calcium of more than 15 on his BMP Patient got a dose of zoledronic acid.  Hypercalcemia resolved. Decreased the rate of IV fluids.  PTH is 5   Severe protein calorie malnutrition Dietary consulted and recommendations given.   AKI Baseline creatinine around 0.8 admitted with AKI. Creatinine has improved to 1 Decreased the rate of fluids.    leukocytosis probably secondary to infection, improving.   Hypokalemia Replaced.   Anemia of chronic disease.  Hemoglobin around 9. Today. ? Hemodilution.     DVT prophylaxis: (SCDs) Code Status: (Full/code. ) Family Communication: none at bedside.  Disposition:   Status is: Inpatient  Remains inpatient appropriate because:Unsafe d/c plan  Waiting for bed at SNF.    Dispo: The patient is from: Home              Anticipated d/c is to: SNF              Anticipated d/c date is: 1 day              Patient currently is medically stable to d/c.       Consultants:   Urology   Procedures: none.    Antimicrobials: Ciprofloxacin   Subjective: No complaints.  Objective: Vitals:   11/13/20 1900 11/13/20 2015 11/14/20 0553 11/14/20 1255  BP: 122/74 135/81 125/79 107/68  Pulse: 82 87 92 88  Resp:  20 20 18   Temp:  98.7 F (37.1 C)  (!) 97.4 F (36.3 C)  TempSrc:  Oral  Oral  SpO2:  96% 96% 96%  Weight:      Height:        Intake/Output Summary (Last 24 hours) at 11/14/2020 1614 Last data filed at 11/14/2020 1605 Gross per 24 hour  Intake 3065.83 ml  Output 4950 ml  Net -1884.17 ml   Filed Weights   11/03/20 0852  Weight: 50.7 kg    Examination:  General exam: alert and comfortable.  Respiratory system: Air entry fair, no wheezing heard.  Cardiovascular system: S1S2 heard, RRR no JVD.  Gastrointestinal system: Abdomen is soft, NT ND BS+ Central  nervous system: Alert and oriented,  Extremities: no pedal edema.   Skin: no rashes seen.  Psychiatry:  Mood is appropriate.     Data Reviewed: I have personally reviewed following labs and imaging studies  CBC: Recent Labs  Lab 11/09/20 1207 11/13/20 0512  WBC 19.7* 14.0*  HGB 11.3* 9.1*  HCT 37.4* 29.8*  MCV 91.7 91.1  PLT 322 409    Basic Metabolic Panel: Recent Labs  Lab 11/10/20 0708 11/11/20 0551 11/12/20 0710 11/12/20 0939 11/13/20 0512 11/14/20 0507  NA 139 140  --  143 140 141  K 3.7 3.7  --  3.0* 3.2* 3.9  CL 108 112*  --  114* 113* 115*  CO2 23 21*  --  20* 20* 16*  GLUCOSE 95 95  --  82 86 102*  BUN 44* 31*  --  23 18 16   CREATININE 1.71* 1.35*  1.44* 1.24 1.20 1.05 1.03  0.96  CALCIUM 13.2* 12.0*  --  10.3 9.4 10.1    GFR: Estimated Creatinine Clearance: 55 mL/min (by C-G formula based on SCr of 0.96 mg/dL).  Liver Function Tests: Recent Labs  Lab 11/11/20 0551  AST 31  ALT 19  ALKPHOS 98  BILITOT 0.6  PROT 6.4*  ALBUMIN 2.1*    CBG: Recent Labs  Lab 11/12/20 0633 11/13/20 0745 11/13/20 1206 11/13/20 1609 11/14/20 0521  GLUCAP 111* 111* 111* 119* 107*     No results found for this or any previous visit (from the past 240 hour(s)).       Radiology Studies: No results found.      Scheduled Meds: . (feeding supplement) PROSource Plus  30 mL Oral BID WC  . B-complex with vitamin C  1 tablet Oral Daily  . Chlorhexidine Gluconate Cloth  6 each Topical Daily  . ciprofloxacin  500 mg Oral BID  . feeding supplement  237 mL Oral TID BM  . folic acid  1 mg Oral Daily  . hydrocerin   Topical BID  . nicotine  14 mg Transdermal Daily  . polyethylene glycol  17 g Oral BID  . cyanocobalamin  1,000 mcg Oral Daily   Continuous Infusions: . sodium chloride    . sodium chloride 75 mL/hr at 11/14/20 1312     LOS: 11 days        [name], MD [facility]   To contact the attending provider between 7A-7P or  the covering provider during after hours 7P-7A, please log into the web site [email or web address] and access using universal [place] password for that web site. If you do not have the password, please call the hospital operator.  11/14/2020, 4:14 PM

## 2020-11-14 NOTE — Progress Notes (Signed)
Physical Therapy Treatment Patient Details Name: Jamie Wilson MRN: [id number] DOB: 11-Oct-1956 Today's Date: 11/14/2020    History of Present Illness 65 y.o. 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 Patient presented secondary to abdominal pain and found to have evidence of sepsis possibly secondary to necrotic bladder mass. Empiric antibiotics initiated. Urology and oncology consulted.    PT Comments    Pt in bed and agreed to get OOB to walk to bathroom for a "wash up" (bribe)  Pt known to refuse.   First assisted with Colostomy to be emptied.  200 cc pudding thick stool reported to NT.   General bed mobility comments: self able with increased time and use of rail.  General transfer comment: pt able to rise from bed and elevated toilet seat with good use of hands to steady self.  Allowed increased time seated EOB due to c/o dizziness which subsided with time. General Gait Details: pt agreed to amb to bathroom for a "wash up" using walker for support with c/o B feet pain "I need my shoes I got bad feet". Set up assist with seated bath at sink.  Assisted with amb back to bed.    Follow Up Recommendations  SNF     Equipment Recommendations  None recommended by PT    Recommendations for Other Services       Precautions / Restrictions Precautions Precautions: Fall Precaution Comments: colostomy Restrictions Weight Bearing Restrictions: No    Mobility  Bed Mobility Overal bed mobility: Modified Independent             General bed mobility comments: self able with increased time and use of rail  Transfers Overall transfer level: Needs assistance Equipment used: 1 person hand held assist Transfers: Sit to/from Stand;Stand Pivot Transfers Sit to Stand: Min guard Stand pivot transfers: Min guard;Min assist       General transfer comment: pt able to rise from bed and elevated  toilet seat with good use of hands to steady self.  Allowed increased time seated EOB due to c/o dizziness which subsided with time.  Ambulation/Gait Ambulation/Gait assistance: Min guard;Min assist Gait Distance (Feet): 20 Feet (to and from bathroom) Assistive device: Rolling walker (2 wheeled) Gait Pattern/deviations: Step-through pattern;Decreased stride length;Trunk flexed Gait velocity: decreased   General Gait Details: pt agreed to amb to bathroom for a "wash up" using walker for support with c/o B feet pain "I need my shoes I got bad feet".   Stairs             Wheelchair Mobility    Modified Rankin (Stroke Patients Only)       Balance                                            Cognition Arousal/Alertness: Awake/alert Behavior During Therapy: WFL for tasks assessed/performed Overall Cognitive Status: Within Functional Limits for tasks assessed                                 General Comments: AxO x 2 loves to talk and has multiple complaints.  Following all commands and agreed to get OOB to bathroom.      Exercises      General Comments  Pertinent Vitals/Pain Pain Assessment: Faces Faces Pain Scale: Hurts little more Pain Location: ABD and feet "I need my shoe" Pain Descriptors / Indicators: Discomfort;Grimacing Pain Intervention(s): Monitored during session    Home Living                      Prior Function            PT Goals (current goals can now be found in the care plan section) Progress towards PT goals: Progressing toward goals    Frequency    Min 2X/week      PT Plan Current plan remains appropriate    Co-evaluation              AM-PAC PT "6 Clicks" Mobility   Outcome Measure  Help needed turning from your back to your side while in a flat bed without using bedrails?: None Help needed moving from lying on your back to sitting on the side of a flat bed without using  bedrails?: None Help needed moving to and from a bed to a chair (including a wheelchair)?: None Help needed standing up from a chair using your arms (e.g., wheelchair or bedside chair)?: A Little Help needed to walk in hospital room?: A Little Help needed climbing 3-5 steps with a railing? : A Little 6 Click Score: 21    End of Session Equipment Utilized During Treatment: Gait belt Activity Tolerance: Patient tolerated treatment well Patient left: in bed;with call bell/phone within reach Nurse Communication: Mobility status PT Visit Diagnosis: Other abnormalities of gait and mobility (R26.89);Muscle weakness (generalized) (M62.81)     Time: 1530-1600 PT Time Calculation (min) (ACUTE ONLY): 30 min  Charges:  $Gait Training: 8-22 mins $Therapeutic Activity: 8-22 mins                     Rica Koyanagi  PTA Acute  Rehabilitation Services Pager      [phone] Office      [phone]

## 2020-11-14 NOTE — Progress Notes (Signed)
Oncology Short Note  Bone survey :  IMPRESSION: No blastic or lytic bone lesions. No findings suggesting bony metastatic disease.   Electronically Signed   By: Lowella Grip III M.D.   On: 11/11/2020 11:54   - NO overt metastatic bone lesions. Hypercalcemia probably from squamous component of bladder cancer. -hypercalcemia resolved with Zometa. -SNF placement pending -he is to f/u with urology in 2 weeks to consider cystectomy -will send scheduling msg to f/u with medical oncology/Dr Irene Limbo in 2 weeks -if not discharged sooner and unable to work with PT -- consult palliative care for ongoing goals of care discussion.  Sullivan Lone MD MS

## 2020-11-15 ENCOUNTER — Inpatient Hospital Stay: Payer: Medicaid Other | Admitting: Hematology

## 2020-11-15 DIAGNOSIS — D649 Anemia, unspecified: Secondary | ICD-10-CM | POA: Diagnosis not present

## 2020-11-15 LAB — CREATININE, SERUM
Creatinine, Ser: 1.05 mg/dL (ref 0.61–1.24)
GFR, Estimated: >60 mL/min (ref >60)

## 2020-11-15 LAB — GLUCOSE, CAPILLARY: Glucose-Capillary: 106 mg/dL — ABNORMAL HIGH (ref 70–99)

## 2020-11-15 NOTE — Plan of Care (Signed)
  Problem: Elimination: Goal: Will not experience complications related to bowel motility Outcome: Progressing   Problem: Pain Managment: Goal: General experience of comfort will improve Outcome: Progressing   Problem: Education: Goal: Knowledge of General Education information will improve Description: Including pain rating scale, medication(s)/side effects and non-pharmacologic comfort measures Outcome: Completed/Met   

## 2020-11-15 NOTE — TOC Progression Note (Signed)
Transition of Care [facility]) - Progression Note    Patient Details  Name: Jamie Wilson MRN: [id number] Date of Birth: 07-27-1956  [facility] of [facility]) CM/SW Contact  Ross Ludwig, [place] Phone Number: 11/15/2020, 6:27 PM  Clinical Narrative:     CSW spoke to Carollee Leitz at [facility] they are no longer able to offer a bed for patient.  CSW to continue to look for placement.  Expected Discharge Plan: Skilled Nursing Facility Barriers to Discharge: Continued Medical Work up,Insurance Authorization  Expected Discharge Plan and Services Expected Discharge Plan: [place]         Expected Discharge Date:  (unknown)                                     Social Determinants of Health (SDOH) Interventions    Readmission Risk Interventions Readmission Risk Prevention Plan 10/04/2020  Transportation Screening Complete  PCP or Specialist Appt within 5-7 Days Complete  Home Care Screening Complete  Medication Review (RN CM) Complete

## 2020-11-15 NOTE — TOC Progression Note (Signed)
Transition of Care [facility]) - Progression Note    Patient Details  Name: Sarim Rothman MRN: [id number] Date of Birth: 1956-04-27  [facility]) CM/SW Contact  Ross Ludwig, [place] Phone Number: 11/15/2020, 6:25 PM  Clinical Narrative:     CSW received a phone call from Delsa Grana at [facility] case manager asking what patient's disposition plan is.  CSW informed her that CSW is trying to find a SNF bed for patient, however CSW has not been successful at finding a bed.  Patient did have a bed at [facility], however they are not able to accept patient anymore.  CSW to continue bed search for patient for SNF.   Expected Discharge Plan: Skilled Nursing Facility Barriers to Discharge: Continued Medical Work up,Insurance Authorization  Expected Discharge Plan and Services Expected Discharge Plan: [place]         Expected Discharge Date:  (unknown)                                     Social Determinants of Health (SDOH) Interventions    Readmission Risk Interventions Readmission Risk Prevention Plan 10/04/2020  Transportation Screening Complete  PCP or Specialist Appt within 5-7 Days Complete  Home Care Screening Complete  Medication Review (RN CM) Complete

## 2020-11-15 NOTE — Progress Notes (Signed)
PROGRESS NOTE    Jamie Wilson  WVP:[id number] DOB: [birth year] DOA: 11/02/2020 PCP: Patient, [name]   Chief Complaint  Patient presents with  . Pain  . Bladder Cancer    Brief Narrative: Jamie Wilson is a 65 y.o. 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 Patient presented secondary to abdominal pain and found to have evidence of sepsis possibly secondary to necrotic bladder mass. Empiric antibiotics initiated. Urology and oncology consulted aand recommendations given. Hospital course complicated by hypercalcemia,.. Bone scan does not show any lytic or blastic bone lesions suspicious for bone mets.  His hypercalcemia is resolved, his AKI is resolved. Pt is stable for discharge to SNF.  PT evaluation recommending SNF.    Assessment & Plan:   Active Problems:   Malnutrition ([employer])   Sepsis ([place])   Protein-calorie malnutrition, severe   Lower urinary tract infectious disease   Palliative care by specialist   Goals of care, counseling/discussion    Sepsis probably from urinary source. CT of the abdomen and pelvis significant for necrotic bladder mass. Patient was empirically started on broad-spectrum IV antibiotics.  Urine culture shows multiple species of bacteria.  Blood cultures negative so far. Urology consulted recommended to continue with ciprofloxacin for total of 2 weeks of antibiotics.  Last day of antibiotics on 11/17/20.  NO changes in meds.    Urothelial carcinoma of the bladder Urology consulted and Foley catheter placed.  Recommend outpatient elective radical cystectomy. Oncology consulted recommended patient is a poor candidate for neoadjuvant chemotherapy, recommendations to consider cystectomy prior to adjuvant chemo or immunotherapy versus radiation. No interventions at this time.  No metastasis to bones. Appreciate oncology recommendations.      Hypercalcemia of  malignancy Patient had a calcium of more than 15 on his BMP Patient got a dose of zoledronic acid.  Hypercalcemia resolved. Calcium of 10.1, creatinine of 1.05.  PTH is 5   Severe protein calorie malnutrition Dietary consulted and recommendations given.   AKI Baseline creatinine around 0.8 admitted with AKI. Creatinine has improved to 1 Decreased the rate of fluids.    leukocytosis probably secondary to infection, improving.   Hypokalemia Replaced.   Anemia of chronic disease.  Hemoglobin around 9. Today. ? Hemodilution.     DVT prophylaxis: (SCDs) Code Status: (Full/code. ) Family Communication: none at bedside.  Disposition:   Status is: Inpatient  Remains inpatient appropriate because:Unsafe d/c plan  Waiting for bed at SNF.    Dispo: The patient is from: Home              Anticipated d/c is to: SNF              Anticipated d/c date is: 1 day              Patient currently is medically stable to d/c.       Consultants:   Urology   Procedures: none.    Antimicrobials: Ciprofloxacin   Subjective: No new complaints.  Objective: Vitals:   11/14/20 1748 11/14/20 2106 11/15/20 0532 11/15/20 1215  BP: 114/70 113/66 107/72 118/70  Pulse: 99 92 93 97  Resp: 20 19 20 16   Temp: 97.9 F (36.6 C) 98.3 F (36.8 C) 98.1 F (36.7 C) 98.2 F (36.8 C)  TempSrc: Oral Oral  Oral  SpO2: 95% 97% 97% 96%  Weight:      Height:        Intake/Output Summary (Last  24 hours) at 11/15/2020 1649 Last data filed at 11/15/2020 1524 Gross per 24 hour  Intake 644.95 ml  Output 4200 ml  Net -3555.05 ml   Filed Weights   11/03/20 0852  Weight: 50.7 kg    Examination:  General exam: alert and comfortable. Not in distress.  Respiratory system: clear to auscultation, no wheezing or rhonchi.  Cardiovascular system: S1S2, RR no JVD, no pedal edema.   Gastrointestinal system: Abdomen is soft, NT ND BS+ Central nervous system: Alert and oriented, non focal.   Extremities: No pedal edema. Skin: No rashes seen.  Psychiatry:  Mood is appropriate.     Data Reviewed: I have personally reviewed following labs and imaging studies  CBC: Recent Labs  Lab 11/09/20 1207 11/13/20 0512  WBC 19.7* 14.0*  HGB 11.3* 9.1*  HCT 37.4* 29.8*  MCV 91.7 91.1  PLT 322 161    Basic Metabolic Panel: Recent Labs  Lab 11/10/20 0708 11/11/20 0551 11/12/20 0710 11/12/20 0939 11/13/20 0512 11/14/20 0507 11/15/20 0522  NA 139 140  --  143 140 141  --   K 3.7 3.7  --  3.0* 3.2* 3.9  --   CL 108 112*  --  114* 113* 115*  --   CO2 23 21*  --  20* 20* 16*  --   GLUCOSE 95 95  --  82 86 102*  --   BUN 44* 31*  --  23 18 16   --   CREATININE 1.71* 1.35*  1.44* 1.24 1.20 1.05 1.03  0.96 1.05  CALCIUM 13.2* 12.0*  --  10.3 9.4 10.1  --     GFR: Estimated Creatinine Clearance: 50.3 mL/min (by C-G formula based on SCr of 1.05 mg/dL).  Liver Function Tests: Recent Labs  Lab 11/11/20 0551  AST 31  ALT 19  ALKPHOS 98  BILITOT 0.6  PROT 6.4*  ALBUMIN 2.1*    CBG: Recent Labs  Lab 11/13/20 0745 11/13/20 1206 11/13/20 1609 11/14/20 0521 11/15/20 0553  GLUCAP 111* 111* 119* 107* 106*     No results found for this or any previous visit (from the past 240 hour(s)).       Radiology Studies: No results found.      Scheduled Meds: . (feeding supplement) PROSource Plus  30 mL Oral BID WC  . B-complex with vitamin C  1 tablet Oral Daily  . Chlorhexidine Gluconate Cloth  6 each Topical Daily  . ciprofloxacin  500 mg Oral BID  . feeding supplement  237 mL Oral TID BM  . folic acid  1 mg Oral Daily  . hydrocerin   Topical BID  . nicotine  14 mg Transdermal Daily  . polyethylene glycol  17 g Oral BID  . cyanocobalamin  1,000 mcg Oral Daily   Continuous Infusions: . sodium chloride    . sodium chloride 75 mL/hr at 11/15/20 1524     LOS: 12 days        [name], MD [facility]   To contact the attending  provider between 7A-7P or the covering provider during after hours 7P-7A, please log into the web site [email or web address] and access using universal [place] password for that web site. If you do not have the password, please call the hospital operator.  11/15/2020, 4:49 PM

## 2020-11-15 NOTE — Plan of Care (Signed)

## 2020-11-15 NOTE — Progress Notes (Signed)
Foley catheter irrigated per order MD order. Clear yellow urine returned.Kylie Simmonds, Laurel Dimmer, RN

## 2020-11-15 NOTE — Progress Notes (Incomplete)
HEMATOLOGY/ONCOLOGY CONSULTATION NOTE  Date of Service: 11/15/2020  Patient Care Team: Patient, [name] as PCP - General ([place]) Freada Bergeron, MD as PCP - Cardiology (Cardiology)  CHIEF COMPLAINTS/PURPOSE OF CONSULTATION:  Bladder Cancer  HISTORY OF PRESENTING ILLNESS:  I connected with  Clance Boll on 11/15/20 by telephone and verified that I am speaking with the correct person using two identifiers.   I discussed the limitations of evaluation and management by telemedicine. The patient expressed understanding and agreed to proceed.  Other persons participating in the visit and their role in the encounter:        -Yevette Edwards, [place], Medical Scribe  Patient's location: Home Provider's location: [facility] [place] is a wonderful 65 y.o. male who is here after being seen in the hospital for evaluation and management of bladder cancer. His last encounter was a phone call on 11/01/2020.   Of note prior to the patient's visit today, pt has had a DG Bone Survey Met ([id number]) completed on 11/11/2020 with results revealing "No blastic or lytic bone lesions. No findings suggesting bony metastatic disease."  The pt reports ***  Lab results today *** of CBC w/diff and CMP is as follows: all values are WNL except for ***  On review of systems, pt reports *** and denies *** and any other symptoms.   MEDICAL HISTORY: Past Medical History:  Diagnosis Date  . Bladder cancer ([place])   . Colostomy care ([place])   . Toe infection     SURGICAL HISTORY: Past Surgical History:  Procedure Laterality Date  . ABDOMINAL SURGERY    . CYSTOSCOPY/URETEROSCOPY/HOLMIUM LASER/STENT PLACEMENT Right 10/06/2020   Procedure: CYSTO;  Surgeon: Irine Seal, MD;  Location: [facility];  Service: Urology;  Laterality: Right;  . TRANSURETHRAL RESECTION OF BLADDER TUMOR N/A 10/06/2020   Procedure: POSSIBLE TRANSURETHRAL RESECTION OF BLADDER TUMOR  (TURBT);  Surgeon: Irine Seal, MD;  Location: [facility];  Service: Urology;  Laterality: N/A;    SOCIAL HISTORY: Social History   Socioeconomic History  . Marital status: Divorced    Spouse name: Not on file  . Number of children: Not on file  . Years of education: Not on file  . Highest education level: Not on file  Occupational History  . Not on file  Tobacco Use  . Smoking status: Current Some Day Smoker  . Smokeless tobacco: Never Used  Substance and Sexual Activity  . Alcohol use: Yes  . Drug use: Yes    Types: Marijuana  . Sexual activity: Not on file  Other Topics Concern  . Not on file  Social History Narrative  . Not on file   Social Determinants of Health   Financial Resource Strain: Not on file  Food Insecurity: Not on file  Transportation Needs: Not on file  Physical Activity: Not on file  Stress: Not on file  Social Connections: Not on file  Intimate Partner Violence: Not on file    FAMILY HISTORY: Family History  Problem Relation Age of Onset  . CAD Neg Hx   . Heart failure Neg Hx     ALLERGIES:  is allergic to silvadene [silver sulfadiazine].  MEDICATIONS:  No current facility-administered medications for this visit.   No current outpatient medications on file.   Facility-Administered Medications Ordered in Other Visits  Medication Dose Route Frequency Provider Last Rate Last Admin  . (feeding supplement) PROSource Plus liquid 30  mL  30 mL Oral BID WC Mariel Aloe, MD   30 mL at 11/15/20 5284  . 0.9 %  sodium chloride infusion  10 mL/hr Intravenous Once Cardama, Grayce Sessions, MD      . 0.9 %  sodium chloride infusion   Intravenous Continuous [name], MD 75 mL/hr at 11/15/20 0240 New Bag at 11/15/20 0240  . acetaminophen (TYLENOL) tablet 650 mg  650 mg Oral Q6H PRN Marylyn Ishihara, Tyrone A, DO   650 mg at 11/05/20 1324   Or  . acetaminophen (TYLENOL) suppository 650 mg  650 mg Rectal Q6H PRN Marylyn Ishihara, Tyrone A, DO      . B-complex with vitamin C  tablet 1 tablet  1 tablet Oral Daily Kyle, Tyrone A, DO   1 tablet at 11/15/20 0939  . Chlorhexidine Gluconate Cloth 2 % PADS 6 each  6 each Topical Daily Mariel Aloe, MD   6 each at 11/15/20 0940  . ciprofloxacin (CIPRO) tablet 500 mg  500 mg Oral BID [name], MD   500 mg at 11/15/20 0939  . feeding supplement (ENSURE ENLIVE / ENSURE PLUS) liquid 237 mL  237 mL Oral TID BM Mariel Aloe, MD   237 mL at 11/15/20 0938  . folic acid (FOLVITE) tablet 1 mg  1 mg Oral Daily Kyle, Tyrone A, DO   1 mg at 11/15/20 4010  . hydrocerin (EUCERIN) cream   Topical BID Mariel Aloe, MD   Given at 11/15/20 0940  . hydrOXYzine (ATARAX) 10 MG/5ML syrup 50 mg  50 mg Oral TID PRN Lovey Newcomer T, NP   50 mg at 11/07/20 0953  . nicotine (NICODERM CQ - dosed in mg/24 hours) patch 14 mg  14 mg Transdermal Daily Basilio Cairo, NP   14 mg at 11/15/20 0942  . ondansetron (ZOFRAN) tablet 4 mg  4 mg Oral Q6H PRN Marylyn Ishihara, Tyrone A, DO       Or  . ondansetron (ZOFRAN) injection 4 mg  4 mg Intravenous Q6H PRN Marylyn Ishihara, Tyrone A, DO      . oxyCODONE-acetaminophen (PERCOCET/ROXICET) 5-325 MG per tablet 1-2 tablet  1-2 tablet Oral Q4H PRN Mariel Aloe, MD   2 tablet at 11/15/20 [phone]  . polyethylene glycol (MIRALAX / GLYCOLAX) packet 17 g  17 g Oral BID Mariel Aloe, MD   17 g at 11/14/20 0942  . sodium chloride flush (NS) 0.9 % injection 10-40 mL  10-40 mL Intracatheter PRN Tegeler, Gwenyth Allegra, MD      . sodium chloride flush (NS) 0.9 % injection 10-40 mL  10-40 mL Intracatheter PRN Mariel Aloe, MD      . vitamin B-12 (CYANOCOBALAMIN) tablet 1,000 mcg  1,000 mcg Oral Daily Kyle, Tyrone A, DO   1,000 mcg at 11/15/20 3664    REVIEW OF SYSTEMS:   10 Point review of Systems was done is negative except as noted above.  PHYSICAL EXAMINATION: ECOG PERFORMANCE STATUS: 4 - Bedbound  .There were no vitals filed for this visit. There were no vitals filed for this visit. .There is no height or weight on file to  calculate BMI.   ***  GENERAL:alert, in no acute distress and comfortable SKIN: no acute rashes, no significant lesions EYES: conjunctiva are pink and non-injected, sclera anicteric OROPHARYNX: MMM, no exudates, no oropharyngeal erythema or ulceration NECK: supple, no JVD LYMPH:  no palpable lymphadenopathy in the cervical, axillary or inguinal regions LUNGS: clear to auscultation b/l with normal  respiratory effort HEART: regular rate & rhythm ABDOMEN:  normoactive bowel sounds , non tender, not distended. Extremity: no pedal edema PSYCH: alert & oriented x 3 with fluent speech NEURO: no focal motor/sensory deficits   LABORATORY DATA:  I have reviewed the data as listed  . CBC Latest Ref Rng & Units 11/13/2020 11/09/2020 11/06/2020  WBC 4.0 - 10.5 K/uL 14.0(H) 19.7(H) 14.8(H)  Hemoglobin 13.0 - 17.0 g/dL 9.1(L) 11.3(L) 10.5(L)  Hematocrit 39.0 - 52.0 % 29.8(L) 37.4(L) 33.4(L)  Platelets 150 - 400 K/uL 284 322 305    . CMP Latest Ref Rng & Units 11/15/2020 11/14/2020 11/14/2020  Glucose 70 - 99 mg/dL - 102(H) -  BUN 8 - 23 mg/dL - 16 -  Creatinine 0.61 - 1.24 mg/dL 1.05 1.03 0.96  Sodium 135 - 145 mmol/L - 141 -  Potassium 3.5 - 5.1 mmol/L - 3.9 -  Chloride 98 - 111 mmol/L - 115(H) -  CO2 22 - 32 mmol/L - 16(L) -  Calcium 8.9 - 10.3 mg/dL - 10.1 -  Total Protein 6.5 - 8.1 g/dL - - -  Total Bilirubin 0.3 - 1.2 mg/dL - - -  Alkaline Phos 38 - 126 U/L - - -  AST 15 - 41 U/L - - -  ALT 0 - 44 U/L - - -     RADIOGRAPHIC STUDIES: I have personally reviewed the radiological images as listed and agreed with the findings in the report. CT ABDOMEN PELVIS W CONTRAST  Result Date: 11/03/2020 CLINICAL DATA:  Abdominal pain.  Bladder cancer. EXAM: CT ABDOMEN AND PELVIS WITH CONTRAST TECHNIQUE: Multidetector CT imaging of the abdomen and pelvis was performed using the standard protocol following bolus administration of intravenous contrast. CONTRAST:  65m OMNIPAQUE IOHEXOL 300 MG/ML  SOLN  COMPARISON:  10/08/2020 FINDINGS: Lower chest:  No acute finding Hepatobiliary: Multiple hepatic cysts measuring up to 5.4 cm in the right upper lobe.No evidence of biliary obstruction or stone. Pancreas: Unremarkable. Spleen: Unremarkable. Adrenals/Urinary Tract: Negative adrenals. No hydronephrosis or ureteral stone. Multiple renal calculi, more numerous on the right where the largest stone measures 14 mm. Right renal cortical scarring asymmetric to the right. Bulky necrotic tumor at the dome of the bladder with high-density in the bladder lumen likely reflecting both clot and calcification. The confluent mass measures at least 9 cm craniocaudal. Foley catheter is in expected position. The mass is intimately associated with small bowel loops in the pelvis, without bowel obstruction or demonstrable fistulization. Stomach/Bowel: Descending colostomy. No bowel obstruction or visible inflammation. Vascular/Lymphatic: No acute vascular abnormality. Atherosclerosis of the aorta and iliacs. There are a few pelvic lymph nodes which show prominent enhancement but are strictly nonenlarged. An 8 mm node along the right iliac chain on 2:66 is an example. No para-aortic adenopathy is seen. Reproductive:Unremarkable Other: No ascites or pneumoperitoneum. Musculoskeletal: No acute abnormalities. IMPRESSION: 1. Bulky bladder mass with progression from 10/08/2020. The mass appears necrotic with complex material in the bladder lumen. Foley catheter is normally positioned. 2. The mass is intimately associated with pelvic small bowel loops. Fistulization is possible, especially given the degree of bladder gas, but not clearly demonstrated. Please correlate with Foley catheter output. No small bowel obstruction. 3. Possible early nodal metastatic disease in the pelvis. 4. Right renal atrophy and multiple calculi. Electronically Signed   By: JMonte FantasiaM.D.   On: 11/03/2020 07:57   DG Chest Port 1 View  Result Date:  11/03/2020 CLINICAL DATA:  Bladder pain and questionable sepsis. EXAM:  PORTABLE CHEST 1 VIEW COMPARISON:  October 10, 2020 FINDINGS: Very mild, stable biapical atelectasis and scarring is seen. There is no evidence of acute infiltrate, pleural effusion or pneumothorax. The heart size and mediastinal contours are within normal limits. The visualized skeletal structures are unremarkable. IMPRESSION: Stable exam without acute or active cardiopulmonary disease. Electronically Signed   By: Virgina Norfolk M.D.   On: 11/03/2020 00:12   DG Bone Survey Met  Result Date: 11/11/2020 CLINICAL DATA:  Bladder carcinoma EXAM: METASTATIC BONE SURVEY COMPARISON:  CT abdomen and pelvis November 03, 2020; chest radiograph November 03, 2020 FINDINGS: Skull: No blastic or lytic bone lesions.  Sella appears normal. Cervical spine: No blastic or lytic bone lesions. Foci of degenerative type change noted. Thoracic spine: No blastic or lytic bone lesions. Chest: Scarring lateral right base. Lungs otherwise clear. Heart size normal. No blastic or lytic bone lesions. Lumbar spine: No blastic or lytic bone lesions. Pelvis: No blastic or lytic bone lesions evident. Right femur: No blastic or lytic bone lesions. No abnormal periosteal reaction. Left femur: No blastic or lytic bone lesions. No abnormal periosteal reaction. Right tibia and fibula: No blastic or lytic bone lesions. No abnormal periosteal reaction. Left tibia and fibula: No blastic or lytic bone lesions. No abnormal periosteal reaction. Right shoulder and humerus: No blastic or lytic bone lesions. No abnormal periosteal reaction. Left shoulder and humerus: No blastic or lytic bone lesions. No abnormal periosteal reaction. Right forearm: No blastic or lytic bone lesions. No abnormal periosteal reaction. Left forearm: No blastic or lytic bone lesions. No abnormal periosteal reaction. IMPRESSION: No blastic or lytic bone lesions. No findings suggesting bony metastatic disease.  Electronically Signed   By: Lowella Grip III M.D.   On: 11/11/2020 11:54   DG Foot 2 Views Left  Result Date: 11/05/2020 CLINICAL DATA:  Pain.  No injury.  Possible gout. EXAM: LEFT FOOT - 2 VIEW COMPARISON:  None. FINDINGS: There is no evidence of fracture or dislocation. There is no evidence of arthropathy or other focal bone abnormality. Soft tissues are unremarkable. IMPRESSION: Negative. Electronically Signed   By: Dorise Bullion III M.D   On: 11/05/2020 10:36   DG Foot 2 Views Right  Result Date: 11/05/2020 CLINICAL DATA:  Pain.  No trauma. EXAM: RIGHT FOOT - 2 VIEW COMPARISON:  None. FINDINGS: There is no evidence of fracture or dislocation. There is no evidence of arthropathy or other focal bone abnormality. Soft tissues are unremarkable. IMPRESSION: Negative. Electronically Signed   By: Dorise Bullion III M.D   On: 11/05/2020 10:37    ASSESSMENT & PLAN:   65 yo with   1) newly diagnosed muscle invasive bladder cancer tx,nx,Mx , poorly , multiple morphologies. 2) ?mediastinal/hilar LN - borderline ? Mets 3) poor functional status ECOG PS 3 4) Severe protein calorie malnutrition 5) Poor social support system-- was incarcerated for a long time and currently lives in halfway house. 6) Recurent UTI's-- recently with hematuria and sepsis.  PLAN: -***  -Currently patient is not a good candidate for neoadjuvant cisplatin based chemotherapy. Will need to have urology weigh in regarding candidacy for cystectomy followed by adjuvant rx options. Alternative might need to consider chemo RT or palliative RT alone. -Will see back in ***   FOLLOW UP: ***    The total time spent in the appointment was *** minutes and more than 50% was on counseling and direct patient cares.    Sullivan Lone MD [place] AAHIVMS [facility] Hematology/Oncology Physician  [place]  (Office):       [phone] (Work cell):  [phone] (Fax):           [phone]  11/15/2020 10:06 AM  I  have reviewed the above documentation for accuracy and completeness, and I agree with the above.

## 2020-11-16 ENCOUNTER — Inpatient Hospital Stay (HOSPITAL_COMMUNITY): Payer: Medicaid Other

## 2020-11-16 DIAGNOSIS — Z933 Colostomy status: Secondary | ICD-10-CM

## 2020-11-16 DIAGNOSIS — R140 Abdominal distension (gaseous): Secondary | ICD-10-CM

## 2020-11-16 DIAGNOSIS — C689 Malignant neoplasm of urinary organ, unspecified: Secondary | ICD-10-CM | POA: Diagnosis not present

## 2020-11-16 DIAGNOSIS — E43 Unspecified severe protein-calorie malnutrition: Secondary | ICD-10-CM | POA: Diagnosis not present

## 2020-11-16 DIAGNOSIS — R112 Nausea with vomiting, unspecified: Secondary | ICD-10-CM

## 2020-11-16 DIAGNOSIS — A419 Sepsis, unspecified organism: Secondary | ICD-10-CM | POA: Diagnosis not present

## 2020-11-16 DIAGNOSIS — N390 Urinary tract infection, site not specified: Secondary | ICD-10-CM | POA: Diagnosis not present

## 2020-11-16 DIAGNOSIS — R5381 Other malaise: Secondary | ICD-10-CM

## 2020-11-16 LAB — BASIC METABOLIC PANEL
Anion gap: 9 (ref 5–15)
BUN: 19 mg/dL (ref 8–23)
CO2: 21 mmol/L — ABNORMAL LOW (ref 22–32)
Calcium: 11.5 mg/dL — ABNORMAL HIGH (ref 8.9–10.3)
Chloride: 109 mmol/L (ref 98–111)
Creatinine, Ser: 0.99 mg/dL (ref 0.61–1.24)
GFR, Estimated: >60 mL/min (ref >60)
Glucose, Bld: 108 mg/dL — ABNORMAL HIGH (ref 70–99)
Potassium: 3.8 mmol/L (ref 3.5–5.1)
Sodium: 139 mmol/L (ref 135–145)

## 2020-11-16 LAB — CBC
HCT: 33.8 % — ABNORMAL LOW (ref 39.0–52.0)
Hemoglobin: 10.3 g/dL — ABNORMAL LOW (ref 13.0–17.0)
MCH: 27.6 pg (ref 26.0–34.0)
MCHC: 30.5 g/dL (ref 30.0–36.0)
MCV: 90.6 fL (ref 80.0–100.0)
Platelets: 391 10*3/uL (ref 150–400)
RBC: 3.73 MIL/uL — ABNORMAL LOW (ref 4.22–5.81)
RDW: 15.3 % (ref 11.5–15.5)
WBC: 16.9 10*3/uL — ABNORMAL HIGH (ref 4.0–10.5)
nRBC: 0 % (ref 0.0–0.2)

## 2020-11-16 LAB — GLUCOSE, CAPILLARY: Glucose-Capillary: 102 mg/dL — ABNORMAL HIGH (ref 70–99)

## 2020-11-16 NOTE — Progress Notes (Addendum)
Nutrition Follow-up  DOCUMENTATION CODES:   Severe malnutrition in context of chronic illness,Underweight  INTERVENTION:   -Needs updated weight for admission  -Prosource Plus PO BID, each provides 100 kcals and 15g protein  -Ensure Enlive po TID, each supplement provides 350 kcal and 20 grams of protein  NUTRITION DIAGNOSIS:   Severe Malnutrition related to chronic illness,cancer and cancer related treatments as evidenced by severe fat depletion,severe muscle depletion,percent weight loss.  Ongoing.  GOAL:   Patient will meet greater than or equal to 90% of their needs  Not meetng.  MONITOR:   PO intake,Supplement acceptance,Labs,I & O's,Weight trends  ASSESSMENT:   65 y.o. 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 Patient presented secondary to abdominal pain and found to have evidence of sepsis possibly secondary to necrotic bladder mass.  Patient not eating well recently, 0-20% of meal completions yesterday with supplements being declined. Will continue supplements.  Per MD note, now pursuing SNF at discharge.  Admission weight: 111 lbs. No new weights for admission.  Medications: B-complex with Vitamin C, Folic acid, Miralax, Vitamin B-12  Labs reviewed: CBGs: 102-106  Diet Order:   Diet Order            Diet regular Room service appropriate? Yes; Fluid consistency: Thin  Diet effective now                 EDUCATION NEEDS:   No education needs have been identified at this time  Skin:  Skin Assessment: Reviewed RN Assessment  Last BM:  1/19  Height:   Ht Readings from Last 1 Encounters:  11/02/20 6\' 1"  (1.854 m)    Weight:   Wt Readings from Last 1 Encounters:  11/03/20 50.7 kg    BMI:  Body mass index is 14.74 kg/m.  Estimated Nutritional Needs:   Kcal:  2100-2300  Protein:  100-115g  Fluid:  2.1L/day  Jamie Bibles, MS, RD,  LDN Inpatient Clinical Dietitian Contact information available via Amion

## 2020-11-16 NOTE — Plan of Care (Signed)
  Problem: Clinical Measurements: Goal: Cardiovascular complication will be avoided Outcome: Progressing   Problem: Activity: Goal: Risk for activity intolerance will decrease Outcome: Progressing   Problem: Nutrition: Goal: Adequate nutrition will be maintained Outcome: Progressing   Problem: Coping: Goal: Level of anxiety will decrease Outcome: Progressing   Problem: Elimination: Goal: Will not experience complications related to bowel motility Outcome: Progressing   Problem: Pain Managment: Goal: General experience of comfort will improve Outcome: Progressing   

## 2020-11-16 NOTE — TOC Progression Note (Signed)
Transition of Care [facility]) - Progression Note    Patient Details  Name: Jamie Wilson MRN: [id number] Date of Birth: 04/06/1956  [facility]) CM/SW Contact  Ross Ludwig, Central Phone Number: 11/16/2020, 3:30 PM  Clinical Narrative:     CSW sent updated clinical information to SNFs still trying to find a bed.  CSW continuing to follow patient's progress throughout discharge planning.  Expected Discharge Plan: Skilled Nursing Facility Barriers to Discharge: Continued Medical Work up,Insurance Authorization  Expected Discharge Plan and Services Expected Discharge Plan: [place]         Expected Discharge Date:  (unknown)                                     Social Determinants of Health (SDOH) Interventions    Readmission Risk Interventions Readmission Risk Prevention Plan 10/04/2020  Transportation Screening Complete  PCP or Specialist Appt within 5-7 Days Complete  Home Care Screening Complete  Medication Review (RN CM) Complete

## 2020-11-16 NOTE — NC FL2 (Signed)
[place] LEVEL OF CARE SCREENING TOOL     IDENTIFICATION  Patient Name: Jamie Wilson Birthdate: 1956-10-14 Sex: male Admission Date (Current Location): 11/02/2020  [place] and [place] Number:  Kathleen Argue [id number] [place] and Address:  [facility],  [place] [address], [place]      Provider Number: [id number]  Attending Physician Name and Address:  Mercy Riding, MD  Relative Name and Phone Number:  Gay Filler   [phone] or Lorelle Formosa [phone]    Current Level of Care: Hospital Recommended Level of Care: [place] Prior Approval Number:    Date Approved/Denied:   PASRR Number: [id number]  Discharge Plan: SNF    Current Diagnoses: Patient Active Problem List   Diagnosis Date Noted  . Palliative care by specialist   . Goals of care, counseling/discussion   . Malignant neoplasm of urinary bladder ([place])   . Lower urinary tract infectious disease 11/03/2020  . Protein-calorie malnutrition, severe 10/12/2020  . Sepsis ([place]) 10/10/2020  . Acute lower UTI 10/02/2020  . 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 Malnutrition ([place]) 10/02/2020  . Acute urinary retention 08/28/2020  . Small bowel obstruction ([place]) 08/16/2020  . Hypercalcemia 08/16/2020  . AKI (acute kidney injury) ([place]) 08/16/2020  . Right nephrolithiasis 08/16/2020    Orientation RESPIRATION BLADDER Height & Weight     Self,Time,Situation,Place  Normal Incontinent Weight: 111 lb 11.2 oz (50.7 kg) Height:  6\' 1"  (185.4 cm)  BEHAVIORAL SYMPTOMS/MOOD NEUROLOGICAL BOWEL NUTRITION STATUS      Incontinent Diet (Regular)  AMBULATORY STATUS COMMUNICATION OF NEEDS Skin   Limited Assist Verbally Normal                       Personal Care Assistance Level of Assistance  Bathing,Feeding,Dressing Bathing Assistance: Limited assistance Feeding assistance: Independent Dressing Assistance: Limited assistance     Functional Limitations  Info  Sight,Hearing,Speech Sight Info: Adequate Hearing Info: Adequate Speech Info: Adequate    SPECIAL CARE FACTORS FREQUENCY  PT (By licensed PT),OT (By licensed OT)     PT Frequency: Minimum 5x a week OT Frequency: Minimum 5x a week            Contractures Contractures Info: Not present    Additional Factors Info  Code Status Code Status Info: Full Code Allergies Info: Silvadene           Current Medications (11/16/2020):  This is the current hospital active medication list Current Facility-Administered Medications  Medication Dose Route Frequency Provider Last Rate Last Admin  . (feeding supplement) PROSource Plus liquid 30 mL  30 mL Oral BID WC Mariel Aloe, MD   30 mL at 11/15/20 0937  . 0.9 %  sodium chloride infusion  10 mL/hr Intravenous Once Cardama, Grayce Sessions, MD      . 0.9 %  sodium chloride infusion   Intravenous Continuous Mercy Riding, MD 125 mL/hr at 11/16/20 1403 Rate Change at 11/16/20 1403  . acetaminophen (TYLENOL) tablet 650 mg  650 mg Oral Q6H PRN Marylyn Ishihara, Tyrone A, DO   650 mg at 11/05/20 9381   Or  . acetaminophen (TYLENOL) suppository 650 mg  650 mg Rectal Q6H PRN Marylyn Ishihara, Tyrone A, DO      . B-complex with vitamin C tablet 1 tablet  1 tablet Oral Daily Kyle, Tyrone A, DO   1 tablet at 11/16/20 0857  . Chlorhexidine Gluconate Cloth 2 % PADS 6 each  6 each Topical Daily  Mariel Aloe, MD   6 each at 11/16/20 0957  . ciprofloxacin (CIPRO) tablet 500 mg  500 mg Oral BID [name], MD   500 mg at 11/15/20 2011  . feeding supplement (ENSURE ENLIVE / ENSURE PLUS) liquid 237 mL  237 mL Oral TID BM Mariel Aloe, MD   237 mL at 11/16/20 0857  . folic acid (FOLVITE) tablet 1 mg  1 mg Oral Daily Kyle, Tyrone A, DO   1 mg at 11/15/20 6761  . hydrocerin (EUCERIN) cream   Topical BID Mariel Aloe, MD   Given by Other at 11/15/20 2112  . hydrOXYzine (ATARAX) 10 MG/5ML syrup 50 mg  50 mg Oral TID PRN Lovey Newcomer T, NP   50 mg at 11/07/20 0953  .  nicotine (NICODERM CQ - dosed in mg/24 hours) patch 14 mg  14 mg Transdermal Daily Basilio Cairo, NP   14 mg at 11/16/20 0901  . ondansetron (ZOFRAN) tablet 4 mg  4 mg Oral Q6H PRN Marylyn Ishihara, Tyrone A, DO       Or  . ondansetron (ZOFRAN) injection 4 mg  4 mg Intravenous Q6H PRN Marylyn Ishihara, Tyrone A, DO      . oxyCODONE-acetaminophen (PERCOCET/ROXICET) 5-325 MG per tablet 1-2 tablet  1-2 tablet Oral Q4H PRN Mariel Aloe, MD   2 tablet at 11/16/20 0657  . polyethylene glycol (MIRALAX / GLYCOLAX) packet 17 g  17 g Oral BID Mariel Aloe, MD   17 g at 11/15/20 2108  . sodium chloride flush (NS) 0.9 % injection 10-40 mL  10-40 mL Intracatheter PRN Tegeler, Gwenyth Allegra, MD      . sodium chloride flush (NS) 0.9 % injection 10-40 mL  10-40 mL Intracatheter PRN Mariel Aloe, MD      . vitamin B-12 (CYANOCOBALAMIN) tablet 1,000 mcg  1,000 mcg Oral Daily Kyle, Tyrone A, DO   1,000 mcg at 11/15/20 9509     Discharge Medications: Please see discharge summary for a list of discharge medications.  Relevant Imaging Results:  Relevant Lab Results:   Additional Information SSN [id number]  Ross Ludwig, LCSW

## 2020-11-16 NOTE — Progress Notes (Signed)
PROGRESS NOTE  Jamie Wilson IRW:[id number] DOB: 28-Apr-1956   PCP: Patient, [name]  Patient is from: Home  DOA: 11/02/2020 LOS: 21  Chief complaints: Abdominal pain  Brief Narrative / Interim history: 65 y.o.malewith medical history significant ofbladder CA w/ chronic foley placement; bowel obstruction s/p partial bowel resection w/ colostomy. Patient presented secondary to abdominal pain and found to have evidence of sepsis possibly secondary to necrotic bladder mass. Empiric antibiotics initiated. Urology and oncology consulted aand recommendations given. Hospital course complicated by hypercalcemia,.. Bone scan does not show any lytic or blastic bone lesions suspicious for bone mets.  His hypercalcemia is resolved, his AKI is resolved.  However, still with nausea, emesis, abdominal distention and hypercalcemia.  Therapy recommended SNF when he is medically stable.  Subjective: Seen and examined earlier this morning.  He reports about 3 episodes of emesis since last night.  He also feels distended although he had some output from ostomy.  He also noted some blood clots from Foley.  Denies chest pain, dyspnea, abdominal pain.  Objective: Vitals:   11/15/20 1215 11/15/20 2156 11/16/20 0516 11/16/20 1344  BP: 118/70 116/64 118/69 124/80  Pulse: 97 96 (!) 106 (!) 110  Resp: 16 19 19 18   Temp: 98.2 F (36.8 C) 98.6 F (37 C) 97.9 F (36.6 C) 98.4 F (36.9 C)  TempSrc: Oral  Oral Oral  SpO2: 96% 97% 96% 98%  Weight:      Height:        Intake/Output Summary (Last 24 hours) at 11/16/2020 1624 Last data filed at 11/16/2020 0955 Gross per 24 hour  Intake 1528.22 ml  Output 1800 ml  Net -271.78 ml   Filed Weights   11/03/20 0852  Weight: 50.7 kg    Examination:  GENERAL: Frail looking elderly male.  No apparent distress. HEENT: MMM.  Vision and hearing grossly intact.  NECK: Supple.  No apparent JVD.  RESP:  No IWOB.  Fair aeration bilaterally. CVS:  RRR. Heart sounds  normal.  ABD/GI/GU: BS+. Abd soft, NTND.  Slightly distended.  Normal-looking ostomy output. MSK/EXT:  Moves extremities. No apparent deformity. No edema.  SKIN: no apparent skin lesion or wound NEURO: Awake, alert and oriented appropriately.  No apparent focal neuro deficit. PSYCH: Calm. Normal affect.  Procedures:  None  Microbiology summarized: 1/6-influenza and COVID-19 PCR nonreactive. 1/6-urine culture with multiple species 1/6-blood culture NGTD. 1/7-urine cultures with multiple species.  Assessment & Plan: Sepsis probably from urinary source: POA. CT of the abdomen and pelvis significant for necrotic bladder mass.  Urine culture with multiple species x2.  Blood cultures NGTD.  Urology recommended continuing Cipro for a total of 2 weeks-11/17/2020.  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 -Per oncology,  poor candidate for neoadjuvant chemotherapy, and recommended considering cystectomy prior to adjuvant chemo or immunotherapy versus radiation.  Hypercalcemia of malignancy: Calcium > 15 on admit>>> 11.5.  PTH within normal. -Received zoledronic acid.  -Continue IV fluids  Nausea/vomiting/abdominal distention/colostomy status-had some emesis.  Some output from ostomy.  Due to opiates? -Continue Zofran, MiraLAX. -KUB.  Consider Senokot +/- Reglan after KUB  AKI: Baseline Cr 0.8.  Resolved. -IV fluid as above -Avoid nephrotoxic meds.  Leukocytosis: Could be related to infection or malignancy. -Antibiotics as above -Continue monitoring  Hypokalemia: Resolved.  Anemia of chronic disease: Hgb stable..  -Continue monitoring  Debility/physical deconditioning -Therapy recommended SNF.  Tobacco use disorder: -Nicotine patch  Severe protein calorie malnutrition  Body mass index is 14.74 kg/m. Nutrition Problem: Severe Malnutrition Etiology: chronic illness,cancer and  cancer related treatments Signs/Symptoms: severe fat depletion,severe muscle depletion,percent weight loss Interventions: Ensure Enlive (each supplement provides 350kcal and 20 grams of protein),MVI,Prostat   DVT prophylaxis:  SCDs Start: 11/03/20 2329  Code Status: Full code Family Communication: Patient and/or RN. Available if any question.  Status is: Inpatient  Remains inpatient appropriate because:Persistent severe electrolyte disturbances, Unsafe d/c plan, IV treatments appropriate due to intensity of illness or inability to take PO and Inpatient level of care appropriate due to severity of illness   Dispo: The patient is from: Home              Anticipated d/c is to: SNF              Anticipated d/c date is: 2 days              Patient currently is not medically stable to d/c.       Consultants:  Urology Oncology   Sch Meds:  Scheduled Meds: . (feeding supplement) PROSource Plus  30 mL Oral BID WC  . B-complex with vitamin C  1 tablet Oral Daily  . Chlorhexidine Gluconate Cloth  6 each Topical Daily  . ciprofloxacin  500 mg Oral BID  . feeding supplement  237 mL Oral TID BM  . folic acid  1 mg Oral Daily  . hydrocerin   Topical BID  . nicotine  14 mg Transdermal Daily  . polyethylene glycol  17 g Oral BID  . cyanocobalamin  1,000 mcg Oral Daily   Continuous Infusions: . sodium chloride    . sodium chloride 125 mL/hr at 11/16/20 1403   PRN Meds:.acetaminophen **OR** acetaminophen, hydrOXYzine, ondansetron **OR** ondansetron (ZOFRAN) IV, oxyCODONE-acetaminophen, sodium chloride flush, sodium chloride flush  Antimicrobials: Anti-infectives (From admission, onward)   Start     Dose/Rate Route Frequency Ordered Stop   11/12/20 2000  ciprofloxacin (CIPRO) tablet 500 mg        500 mg Oral 2 times daily 11/12/20 1543 11/17/20 2359   11/10/20 0800  ciprofloxacin (CIPRO) tablet 500 mg  Status:  Discontinued        500 mg Oral Daily with breakfast 11/09/20 1357  11/12/20 1543   11/06/20 1200  ciprofloxacin (CIPRO) tablet 500 mg  Status:  Discontinued        500 mg Oral 2 times daily 11/06/20 1114 11/09/20 1357   11/05/20 1500  cefdinir (OMNICEF) capsule 300 mg  Status:  Discontinued        300 mg Oral Every 12 hours 11/05/20 1337 11/06/20 1114   11/03/20 2200  vancomycin (VANCOREADY) IVPB 500 mg/100 mL  Status:  Discontinued        500 mg 100 mL/hr over 60 Minutes Intravenous Every 12 hours 11/03/20 1402 11/05/20 1335   11/03/20 1415  piperacillin-tazobactam (ZOSYN) IVPB 3.375 g  Status:  Discontinued        3.375 g 12.5 mL/hr over 240 Minutes Intravenous Every 8 hours 11/03/20 1401 11/05/20 1337   11/03/20 0645  vancomycin (VANCOCIN) IVPB 1000 mg/200 mL premix        1,000 mg 200 mL/hr over 60 Minutes Intravenous  Once 11/03/20 0631 11/03/20 1147   11/03/20 0600  piperacillin-tazobactam (ZOSYN) IVPB 4.5 g  Status:  Discontinued        4.5 g 200 mL/hr over 30 Minutes Intravenous  Once 11/03/20 0546 11/03/20 0547   11/03/20 0600  piperacillin-tazobactam (ZOSYN) IVPB 3.375  g        3.375 g 100 mL/hr over 30 Minutes Intravenous To Emergency Dept 11/03/20 0548 11/03/20 0650       I have personally reviewed the following labs and images: CBC: Recent Labs  Lab 11/13/20 0512 11/16/20 0524  WBC 14.0* 16.9*  HGB 9.1* 10.3*  HCT 29.8* 33.8*  MCV 91.1 90.6  PLT 284 391   BMP &GFR Recent Labs  Lab 11/11/20 0551 11/12/20 0710 11/12/20 0939 11/13/20 0512 11/14/20 0507 11/15/20 0522 11/16/20 0524  NA 140  --  143 140 141  --  139  K 3.7  --  3.0* 3.2* 3.9  --  3.8  CL 112*  --  114* 113* 115*  --  109  CO2 21*  --  20* 20* 16*  --  21*  GLUCOSE 95  --  82 86 102*  --  108*  BUN 31*  --  23 18 16   --  19  CREATININE 1.35*  1.44*   < > 1.20 1.05 1.03  0.96 1.05 0.99  CALCIUM 12.0*  --  10.3 9.4 10.1  --  11.5*   < > = values in this interval not displayed.   Estimated Creatinine Clearance: 53.3 mL/min (by C-G formula based on SCr of  0.99 mg/dL). Liver & Pancreas: Recent Labs  Lab 11/11/20 0551  AST 31  ALT 19  ALKPHOS 98  BILITOT 0.6  PROT 6.4*  ALBUMIN 2.1*   No results for input(s): LIPASE, AMYLASE in the last 168 hours. No results for input(s): AMMONIA in the last 168 hours. Diabetic: No results for input(s): HGBA1C in the last 72 hours. Recent Labs  Lab 11/13/20 1206 11/13/20 1609 11/14/20 0521 11/15/20 0553 11/16/20 0514  GLUCAP 111* 119* 107* 106* 102*   Cardiac Enzymes: No results for input(s): CKTOTAL, CKMB, CKMBINDEX, TROPONINI in the last 168 hours. No results for input(s): PROBNP in the last 8760 hours. Coagulation Profile: No results for input(s): INR, PROTIME in the last 168 hours. Thyroid Function Tests: No results for input(s): TSH, T4TOTAL, FREET4, T3FREE, THYROIDAB in the last 72 hours. Lipid Profile: No results for input(s): CHOL, HDL, LDLCALC, TRIG, CHOLHDL, LDLDIRECT in the last 72 hours. Anemia Panel: No results for input(s): VITAMINB12, FOLATE, FERRITIN, TIBC, IRON, RETICCTPCT in the last 72 hours. Urine analysis:    Component Value Date/Time   COLORURINE YELLOW 11/03/2020 0330   APPEARANCEUR TURBID (A) 11/03/2020 0330   LABSPEC 1.019 11/03/2020 0330   PHURINE 7.0 11/03/2020 0330   GLUCOSEU NEGATIVE 11/03/2020 0330   HGBUR NEGATIVE 11/03/2020 0330   BILIRUBINUR NEGATIVE 11/03/2020 0330   KETONESUR 5 (A) 11/03/2020 0330   PROTEINUR >=300 (A) 11/03/2020 0330   NITRITE NEGATIVE 11/03/2020 0330   LEUKOCYTESUR LARGE (A) 11/03/2020 0330   Sepsis Labs: Invalid input(s): PROCALCITONIN, [place]  Microbiology: No results found for this or any previous visit (from the past 240 hour(s)).  Radiology Studies: No results found.    Naiomi Musto T. [place]  If 7PM-7AM, please contact night-coverage [email or web address] 11/16/2020, 4:24 PM

## 2020-11-17 DIAGNOSIS — C689 Malignant neoplasm of urinary organ, unspecified: Secondary | ICD-10-CM | POA: Diagnosis not present

## 2020-11-17 DIAGNOSIS — N390 Urinary tract infection, site not specified: Secondary | ICD-10-CM | POA: Diagnosis not present

## 2020-11-17 DIAGNOSIS — E43 Unspecified severe protein-calorie malnutrition: Secondary | ICD-10-CM | POA: Diagnosis not present

## 2020-11-17 DIAGNOSIS — A419 Sepsis, unspecified organism: Secondary | ICD-10-CM | POA: Diagnosis not present

## 2020-11-17 LAB — COMPREHENSIVE METABOLIC PANEL
ALT: 16 U/L (ref 0–44)
AST: 18 U/L (ref 15–41)
Albumin: 2.2 g/dL — ABNORMAL LOW (ref 3.5–5.0)
Alkaline Phosphatase: 86 U/L (ref 38–126)
Anion gap: 5 (ref 5–15)
BUN: 19 mg/dL (ref 8–23)
CO2: 20 mmol/L — ABNORMAL LOW (ref 22–32)
Calcium: 11 mg/dL — ABNORMAL HIGH (ref 8.9–10.3)
Chloride: 114 mmol/L — ABNORMAL HIGH (ref 98–111)
Creatinine, Ser: 1.09 mg/dL (ref 0.61–1.24)
GFR, Estimated: >60 mL/min (ref >60)
Glucose, Bld: 93 mg/dL (ref 70–99)
Potassium: 3.9 mmol/L (ref 3.5–5.1)
Sodium: 139 mmol/L (ref 135–145)
Total Bilirubin: 0.3 mg/dL (ref 0.3–1.2)
Total Protein: 6.4 g/dL — ABNORMAL LOW (ref 6.5–8.1)

## 2020-11-17 LAB — CBC WITH DIFFERENTIAL/PLATELET
Abs Immature Granulocytes: 0 10*3/uL (ref 0.00–0.07)
Basophils Absolute: 0 10*3/uL (ref 0.0–0.1)
Basophils Relative: 0 %
Eosinophils Absolute: 2.7 10*3/uL — ABNORMAL HIGH (ref 0.0–0.5)
Eosinophils Relative: 17 %
HCT: 29.8 % — ABNORMAL LOW (ref 39.0–52.0)
Hemoglobin: 9.1 g/dL — ABNORMAL LOW (ref 13.0–17.0)
Lymphocytes Relative: 11 %
Lymphs Abs: 1.8 10*3/uL (ref 0.7–4.0)
MCH: 27.8 pg (ref 26.0–34.0)
MCHC: 30.5 g/dL (ref 30.0–36.0)
MCV: 91.1 fL (ref 80.0–100.0)
Monocytes Absolute: 0.5 10*3/uL (ref 0.1–1.0)
Monocytes Relative: 3 %
Neutro Abs: 11 10*3/uL — ABNORMAL HIGH (ref 1.7–7.7)
Neutrophils Relative %: 69 %
Platelets: 434 10*3/uL — ABNORMAL HIGH (ref 150–400)
RBC: 3.27 MIL/uL — ABNORMAL LOW (ref 4.22–5.81)
RDW: 15.7 % — ABNORMAL HIGH (ref 11.5–15.5)
WBC: 16 10*3/uL — ABNORMAL HIGH (ref 4.0–10.5)
nRBC: 0 % (ref 0.0–0.2)

## 2020-11-17 LAB — GLUCOSE, CAPILLARY: Glucose-Capillary: 82 mg/dL (ref 70–99)

## 2020-11-17 LAB — PATHOLOGIST SMEAR REVIEW

## 2020-11-17 LAB — LIPASE, BLOOD: Lipase: 35 U/L (ref 11–51)

## 2020-11-17 LAB — MAGNESIUM: Magnesium: 1.8 mg/dL (ref 1.7–2.4)

## 2020-11-17 LAB — PHOSPHORUS: Phosphorus: 2 mg/dL — ABNORMAL LOW (ref 2.5–4.6)

## 2020-11-17 NOTE — TOC Progression Note (Addendum)
Transition of Care [facility]) - Progression Note    Patient Details  Name: Jamie Wilson MRN: [id number] Date of Birth: 1956-09-24  [facility] CM/SW Contact  Ross Ludwig, [place] Phone Number: 11/17/2020, 4:59 PM  Clinical Narrative:     CSW received phone call from case manager Nigel Sloop from [facility] [phone].  CSW informed her that CSW is trying to find SNF placement for patient and because of his insurance CSW has been struggling to find a SNF that can accept patient.  CSW asked case manager if she could email CSW list of in network SNFs for patient.  CSW received list of in network SNFs.  Unfortunately the in network facilities have not accepted patient for rehab.  CSW asked if case manager is aware of why patient does not have Medicare because he is of age.  She stated she will check into it and let CSW know what she can find out and call CSW back later in the day.  4:30pm CSW received phone call back from [facility] care manager Nigel Sloop, [phone].  Per case manager she stated that she was discussing patient's situation with her colleagues, and they said they may be able to apply for Medicare for patient.  They will try to contact patient tomorrow, CSW provided room and unit phone number for case manager to call tomorrow.  Per Malachy Mood, there is a care meeting about patient tomorrow, they are going to discuss what can be done to help patient get set up with Medicare, and also to see if their team can assist with finding placement for patient.  CSW to receive phone call back from case manager tomorrow.  CSW to continue to follow patient's progress throughout discharge planning.  Patient still does not have any beds available.  Expected Discharge Plan: Skilled Nursing Facility Barriers to Discharge: Continued Medical Work up,Insurance Authorization  Expected Discharge Plan and Services Expected Discharge Plan: [place]         Expected  Discharge Date:  (unknown)                                     Social Determinants of Health (SDOH) Interventions    Readmission Risk Interventions Readmission Risk Prevention Plan 10/04/2020  Transportation Screening Complete  PCP or Specialist Appt within 5-7 Days Complete  Home Care Screening Complete  Medication Review (RN CM) Complete

## 2020-11-17 NOTE — Plan of Care (Signed)

## 2020-11-17 NOTE — Progress Notes (Signed)
PT Cancellation Note  Patient Details Name: Jamie Wilson MRN: [id number] DOB: 11/28/1955   Cancelled Treatment:    Reason Eval/Treat Not Completed: Patient declined, no reason specified   Trena Platt 11/17/2020, 4:12 PM Arlyce Dice, DPT Acute Rehabilitation Services Pager: [phone] Office: [phone]

## 2020-11-17 NOTE — Progress Notes (Signed)
OT Cancellation Note  Patient Details Name: Giomar Gusler MRN: [id number] DOB: 1956/07/16   Cancelled Treatment:    Reason Eval/Treat Not Completed: Other (comment). Patient declined for various reasons however wants therapist to continue to check on him. He's hoping he can get up "in a few days."  Ariyel Jeangilles L Tymar Polyak 11/17/2020, 3:26 PM

## 2020-11-17 NOTE — Progress Notes (Signed)
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 Patient presented secondary to abdominal pain and found to have evidence of sepsis possibly secondary to necrotic bladder mass. Empiric antibiotics initiated. Urology and oncology consulted aand recommendations given. Hospital course complicated by hypercalcemia,.. Bone scan does not show any lytic or blastic bone lesions suspicious for bone mets.  AKI resolved.  Still with some degree of hypercalcemia.  He also had some GI symptoms including nausea, emesis and abdominal distention.  KUB on 1/19 raise concern for SBO but GI symptoms resolved the next day.  Currently advancing diet.    Therapy recommended SNF when he is medically stable.  Subjective: Seen and examined earlier this morning.  No major events overnight or this morning.  No emesis since yesterday at 3 PM.  High output from ostomy x3 overnight.  Denies blood in the stool.  Abdominal distention and discomfort resolved.  Objective: Vitals:   11/16/20 0516 11/16/20 1344 11/16/20 2209 11/17/20 0626  BP: 118/69 124/80 128/76 115/71  Pulse: (!) 106 (!) 110 (!) 110 90  Resp: 19 18 16 15   Temp: 97.9 F (36.6 C) 98.4 F (36.9 C) 98.6 F (37 C) 97.9 F (36.6 C)  TempSrc: Oral Oral Oral Oral  SpO2: 96% 98% 94% 95%  Weight:      Height:        Intake/Output Summary (Last 24 hours) at 11/17/2020 1502 Last data filed at 11/17/2020 0831 Gross per 24 hour  Intake 2765.18 ml  Output 1900 ml  Net 865.18 ml   Filed Weights   11/03/20 0852  Weight: 50.7 kg    Examination:  GENERAL: Frail looking elderly male.  No apparent distress.  Nontoxic. HEENT: MMM.  Vision and hearing grossly  intact.  NECK: Supple.  No apparent JVD.  RESP:  No IWOB.  Fair aeration bilaterally. CVS:  RRR. Heart sounds normal.  ABD/GI/GU: BS+. Abd soft, NTND.  Small normal-looking fecal matter in ostomy bag. MSK/EXT:  Moves extremities. No apparent deformity. No edema.  SKIN: no apparent skin lesion or wound NEURO: Awake, alert and oriented appropriately.  No apparent focal neuro deficit. PSYCH: Calm. Normal affect.  Procedures:  None  Microbiology summarized: 1/6-influenza and COVID-19 PCR nonreactive. 1/6-urine culture with multiple species 1/6-blood culture NGTD. 1/7-urine cultures with multiple species.  Assessment & Plan: Sepsis probably from urinary source: POA. CT of the abdomen and pelvis significant for necrotic bladder mass.  Urine culture with multiple species x2.  Blood cultures NGTD.  Urology recommended continuing Cipro for a total of 2 weeks-11/17/2020.  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 -Per oncology,  poor candidate for neoadjuvant chemotherapy, and recommended considering cystectomy prior to adjuvant chemo or immunotherapy versus radiation.  Hypercalcemia of malignancy: Calcium > 15 on admit>>> 11.  PTH within normal. -Received zoledronic acid.  -Continue IV fluids  Nausea/vomiting/abdominal distention/colostomy status-KUB with some concern for SBO but GI symptoms resolved.  GI symptoms could be due to hypocalcemia and opiates. -Continue Zofran, MiraLAX. -Continue IV fluid as above -Advance diet as tolerated starting with clear liquid diet  AKI: Baseline Cr 0.8.  Resolved. -IV fluid as above -Avoid nephrotoxic meds.  Leukocytosis: Related to malignancy? -  Completed antibiotic course as above. -Continue monitoring  Hypokalemia: Resolved.  Anemia of chronic disease: Hgb stable..  -Continue monitoring  Debility/physical deconditioning -Therapy recommended  SNF.  Tobacco use disorder: -Nicotine patch  Severe protein calorie malnutrition Body mass index is 14.74 kg/m. Nutrition Problem: Severe Malnutrition Etiology: chronic illness,cancer and cancer related treatments Signs/Symptoms: severe fat depletion,severe muscle depletion,percent weight loss Interventions: Ensure Enlive (each supplement provides 350kcal and 20 grams of protein),MVI,Prostat   DVT prophylaxis:  SCDs Start: 11/03/20 2329  Code Status: Full code Family Communication: Patient and/or RN. Available if any question.  Status is: Inpatient  Remains inpatient appropriate because:Persistent severe electrolyte disturbances, Unsafe d/c plan, IV treatments appropriate due to intensity of illness or inability to take PO and Inpatient level of care appropriate due to severity of illness   Dispo: The patient is from: Home              Anticipated d/c is to: SNF              Anticipated d/c date is: 1 day              Patient currently is medically stable to d/c.       Consultants:  Urology Oncology   Sch Meds:  Scheduled Meds: . (feeding supplement) PROSource Plus  30 mL Oral BID WC  . B-complex with vitamin C  1 tablet Oral Daily  . Chlorhexidine Gluconate Cloth  6 each Topical Daily  . ciprofloxacin  500 mg Oral BID  . feeding supplement  237 mL Oral TID BM  . folic acid  1 mg Oral Daily  . hydrocerin   Topical BID  . nicotine  14 mg Transdermal Daily  . polyethylene glycol  17 g Oral BID  . cyanocobalamin  1,000 mcg Oral Daily   Continuous Infusions: . sodium chloride    . sodium chloride 125 mL/hr at 11/17/20 1432   PRN Meds:.acetaminophen **OR** acetaminophen, hydrOXYzine, ondansetron **OR** ondansetron (ZOFRAN) IV, oxyCODONE-acetaminophen, sodium chloride flush, sodium chloride flush  Antimicrobials: Anti-infectives (From admission, onward)   Start     Dose/Rate Route Frequency Ordered Stop   11/12/20 2000  ciprofloxacin (CIPRO) tablet 500 mg         500 mg Oral 2 times daily 11/12/20 1543 11/17/20 2359   11/10/20 0800  ciprofloxacin (CIPRO) tablet 500 mg  Status:  Discontinued        500 mg Oral Daily with breakfast 11/09/20 1357 11/12/20 1543   11/06/20 1200  ciprofloxacin (CIPRO) tablet 500 mg  Status:  Discontinued        500 mg Oral 2 times daily 11/06/20 1114 11/09/20 1357   11/05/20 1500  cefdinir (OMNICEF) capsule 300 mg  Status:  Discontinued        300 mg Oral Every 12 hours 11/05/20 1337 11/06/20 1114   11/03/20 2200  vancomycin (VANCOREADY) IVPB 500 mg/100 mL  Status:  Discontinued        500 mg 100 mL/hr over 60 Minutes Intravenous Every 12 hours 11/03/20 1402 11/05/20 1335   11/03/20 1415  piperacillin-tazobactam (ZOSYN) IVPB 3.375 g  Status:  Discontinued        3.375 g 12.5 mL/hr over 240 Minutes Intravenous Every 8 hours 11/03/20 1401 11/05/20 1337   11/03/20 0645  vancomycin (VANCOCIN) IVPB 1000 mg/200 mL premix        1,000 mg 200 mL/hr over 60 Minutes Intravenous  Once 11/03/20 0631 11/03/20 1147   11/03/20 0600  piperacillin-tazobactam (ZOSYN) IVPB  4.5 g  Status:  Discontinued        4.5 g 200 mL/hr over 30 Minutes Intravenous  Once 11/03/20 0546 11/03/20 0547   11/03/20 0600  piperacillin-tazobactam (ZOSYN) IVPB 3.375 g        3.375 g 100 mL/hr over 30 Minutes Intravenous To Emergency Dept 11/03/20 0548 11/03/20 0650       I have personally reviewed the following labs and images: CBC: Recent Labs  Lab 11/13/20 0512 11/16/20 0524 11/17/20 0549  WBC 14.0* 16.9* 16.0*  NEUTROABS  --   --  11.0*  HGB 9.1* 10.3* 9.1*  HCT 29.8* 33.8* 29.8*  MCV 91.1 90.6 91.1  PLT 284 391 434*   BMP &GFR Recent Labs  Lab 11/12/20 0939 11/13/20 0512 11/14/20 0507 11/15/20 0522 11/16/20 0524 11/17/20 0549  NA 143 140 141  --  139 139  K 3.0* 3.2* 3.9  --  3.8 3.9  CL 114* 113* 115*  --  109 114*  CO2 20* 20* 16*  --  21* 20*  GLUCOSE 82 86 102*  --  108* 93  BUN 23 18 16   --  19 19  CREATININE 1.20 1.05 1.03   0.96 1.05 0.99 1.09  CALCIUM 10.3 9.4 10.1  --  11.5* 11.0*  MG  --   --   --   --   --  1.8  PHOS  --   --   --   --   --  2.0*   Estimated Creatinine Clearance: 48.5 mL/min (by C-G formula based on SCr of 1.09 mg/dL). Liver & Pancreas: Recent Labs  Lab 11/11/20 0551 11/17/20 0549  AST 31 18  ALT 19 16  ALKPHOS 98 86  BILITOT 0.6 0.3  PROT 6.4* 6.4*  ALBUMIN 2.1* 2.2*   Recent Labs  Lab 11/17/20 0549  LIPASE 35   No results for input(s): AMMONIA in the last 168 hours. Diabetic: No results for input(s): HGBA1C in the last 72 hours. Recent Labs  Lab 11/13/20 1609 11/14/20 0521 11/15/20 0553 11/16/20 0514 11/17/20 0628  GLUCAP 119* 107* 106* 102* 82   Cardiac Enzymes: No results for input(s): CKTOTAL, CKMB, CKMBINDEX, TROPONINI in the last 168 hours. No results for input(s): PROBNP in the last 8760 hours. Coagulation Profile: No results for input(s): INR, PROTIME in the last 168 hours. Thyroid Function Tests: No results for input(s): TSH, T4TOTAL, FREET4, T3FREE, THYROIDAB in the last 72 hours. Lipid Profile: No results for input(s): CHOL, HDL, LDLCALC, TRIG, CHOLHDL, LDLDIRECT in the last 72 hours. Anemia Panel: No results for input(s): VITAMINB12, FOLATE, FERRITIN, TIBC, IRON, RETICCTPCT in the last 72 hours. Urine analysis:    Component Value Date/Time   COLORURINE YELLOW 11/03/2020 0330   APPEARANCEUR TURBID (A) 11/03/2020 0330   LABSPEC 1.019 11/03/2020 0330   PHURINE 7.0 11/03/2020 0330   GLUCOSEU NEGATIVE 11/03/2020 0330   HGBUR NEGATIVE 11/03/2020 0330   BILIRUBINUR NEGATIVE 11/03/2020 0330   KETONESUR 5 (A) 11/03/2020 0330   PROTEINUR >=300 (A) 11/03/2020 0330   NITRITE NEGATIVE 11/03/2020 0330   LEUKOCYTESUR LARGE (A) 11/03/2020 0330   Sepsis Labs: Invalid input(s): PROCALCITONIN, [place]  Microbiology: No results found for this or any previous visit (from the past 240 hour(s)).  Radiology Studies: DG Abd Portable 1V  Result Date:  11/16/2020 CLINICAL DATA:  Emesis EXAM: PORTABLE ABDOMEN - 1 VIEW COMPARISON:  10/11/2020, CT 11/03/2020 FINDINGS: Interval moderate gaseous dilatation of stomach. Multiple dilated small bowel loops measuring up to 5.2 cm with  relative paucity of distal gas concerning for a bowel obstruction. IMPRESSION: Interval moderate gaseous dilatation of stomach. Multiple dilated small bowel loops in a pattern concerning for a bowel obstruction. Electronically Signed   By: Donavan Foil M.D.   On: 11/16/2020 19:43      Keylin Podolsky T. [place]  If 7PM-7AM, please contact night-coverage [email or web address] 11/17/2020, 3:02 PM

## 2020-11-18 DIAGNOSIS — C689 Malignant neoplasm of urinary organ, unspecified: Secondary | ICD-10-CM | POA: Diagnosis not present

## 2020-11-18 DIAGNOSIS — A419 Sepsis, unspecified organism: Secondary | ICD-10-CM | POA: Diagnosis not present

## 2020-11-18 DIAGNOSIS — E43 Unspecified severe protein-calorie malnutrition: Secondary | ICD-10-CM | POA: Diagnosis not present

## 2020-11-18 DIAGNOSIS — N390 Urinary tract infection, site not specified: Secondary | ICD-10-CM | POA: Diagnosis not present

## 2020-11-18 LAB — RENAL FUNCTION PANEL
Albumin: 2.1 g/dL — ABNORMAL LOW (ref 3.5–5.0)
Anion gap: 6 (ref 5–15)
BUN: 12 mg/dL (ref 8–23)
CO2: 19 mmol/L — ABNORMAL LOW (ref 22–32)
Calcium: 10.6 mg/dL — ABNORMAL HIGH (ref 8.9–10.3)
Chloride: 112 mmol/L — ABNORMAL HIGH (ref 98–111)
Creatinine, Ser: 0.88 mg/dL (ref 0.61–1.24)
GFR, Estimated: >60 mL/min (ref >60)
Glucose, Bld: 101 mg/dL — ABNORMAL HIGH (ref 70–99)
Phosphorus: 1.5 mg/dL — ABNORMAL LOW (ref 2.5–4.6)
Potassium: 3.6 mmol/L (ref 3.5–5.1)
Sodium: 137 mmol/L (ref 135–145)

## 2020-11-18 LAB — CBC
HCT: 29.2 % — ABNORMAL LOW (ref 39.0–52.0)
Hemoglobin: 8.8 g/dL — ABNORMAL LOW (ref 13.0–17.0)
MCH: 27.4 pg (ref 26.0–34.0)
MCHC: 30.1 g/dL (ref 30.0–36.0)
MCV: 91 fL (ref 80.0–100.0)
Platelets: 421 10*3/uL — ABNORMAL HIGH (ref 150–400)
RBC: 3.21 MIL/uL — ABNORMAL LOW (ref 4.22–5.81)
RDW: 15.6 % — ABNORMAL HIGH (ref 11.5–15.5)
WBC: 14.9 10*3/uL — ABNORMAL HIGH (ref 4.0–10.5)
nRBC: 0 % (ref 0.0–0.2)

## 2020-11-18 LAB — GLUCOSE, CAPILLARY: Glucose-Capillary: 100 mg/dL — ABNORMAL HIGH (ref 70–99)

## 2020-11-18 LAB — MAGNESIUM: Magnesium: 1.7 mg/dL (ref 1.7–2.4)

## 2020-11-18 MED ORDER — ALUM & MAG HYDROXIDE-SIMETH 200-200-20 MG/5ML PO SUSP
30.0000 mL | Freq: Four times a day (QID) | ORAL | Status: DC | PRN
  Administered 2020-11-19 – 2020-11-20 (×2): 30 mL via ORAL
  Filled 2020-11-18 (×3): qty 30

## 2020-11-18 MED ORDER — POTASSIUM PHOSPHATES 15 MMOLE/5ML IV SOLN
30.0000 mmol | Freq: Once | INTRAVENOUS | Status: AC
  Administered 2020-11-18: 30 mmol via INTRAVENOUS
  Filled 2020-11-18: qty 10

## 2020-11-18 NOTE — Progress Notes (Signed)
Pt cussing frequently and asked to stop. Pt apologizes and stops momentarily and starts cussing again when I explain his pain medicine is every 4 hours. Pt argues its PRN and I explain its PRN every 4 hours as so we dont over medicate. Pt states that's the stupidest S* he has ever heard and he will talk to the dr. Pt states "you have to watch these f* nurses","they will give the pain med and scan it later". Pt informed that I will show him the computer when I scan the pain med into the [facility] so we can be sure the times are correct.

## 2020-11-18 NOTE — TOC Progression Note (Addendum)
Transition of Care [facility]) - Progression Note    Patient Details  Name: Kainen Struckman MRN: [id number] Date of Birth: [birth year]  [facility] CM/SW Contact  Ross Ludwig, [place] Phone Number: 11/18/2020, 5:17 PM  Clinical Narrative:     CSW spoke with patient in regards to finding out why he has not applied for Medicare.  Per patient he felt like the premiums were to high, and he only has $800 a month coming in.  Per patient he states he needs to have money for rent, and living expeneses.  CSW spoke to [facility] [phone] who said they are not able to assist with helping patient apply for Medicare because it would be considered conflict of interest.  She will reach out to her manager to see if they have any other suggestions.  CSW notified leadership that patient may be a difficult to place patient and to add him to the difficult to place list so the team can review patient and try to come up with a plan for patient.    Expected Discharge Plan: Skilled Nursing Facility Barriers to Discharge: Continued Medical Work up,Insurance Authorization  Expected Discharge Plan and Services Expected Discharge Plan: [place]         Expected Discharge Date:  (unknown)                                     Social Determinants of Health (SDOH) Interventions    Readmission Risk Interventions Readmission Risk Prevention Plan 10/04/2020  Transportation Screening Complete  PCP or Specialist Appt within 5-7 Days Complete  Home Care Screening Complete  Medication Review (RN CM) Complete

## 2020-11-18 NOTE — Progress Notes (Signed)
PROGRESS NOTE  Jamie Wilson [id number] DOB: 07/27/1956   PCP: Patient, [name]  Patient is from: Home  DOA: 11/02/2020 LOS: 28  Chief complaints: Abdominal pain  Brief Narrative / Interim history: 65 y.o.malewith medical history significant ofbladder CA w/ chronic foley placement; bowel obstruction s/p partial bowel resection w/ colostomy. Patient presented secondary to abdominal pain and found to have evidence of sepsis possibly secondary to necrotic bladder mass. Empiric antibiotics initiated. Urology and oncology consulted aand recommendations given. Hospital course complicated by hypercalcemia,.. Bone scan does not show any lytic or blastic bone lesions suspicious for bone mets.  AKI resolved.  Still with some degree of hypercalcemia.  He also had some GI symptoms including nausea, emesis and abdominal distention.  KUB on 1/19 raise concern for SBO but GI symptoms resolved the next day.  Advanced diet to soft.  Therapy recommended SNF when he is medically stable.  Subjective: Seen and examined earlier this morning.  No major events overnight or this morning.  He complains about the overnight nurse not emptying the urine box on time.  Not a great historian.  Objective: Vitals:   11/17/20 0626 11/17/20 1502 11/17/20 2141 11/18/20 0400  BP: 115/71 102/64 113/67 121/70  Pulse: 90 84 84 98  Resp: 15 20 18 18   Temp: 97.9 F (36.6 C) 98.2 F (36.8 C) 97.7 F (36.5 C) 97.9 F (36.6 C)  TempSrc: Oral Oral Oral Oral  SpO2: 95% 96% 96% 96%  Weight:      Height:        Intake/Output Summary (Last 24 hours) at 11/18/2020 1307 Last data filed at 11/18/2020 1200 Gross per 24 hour  Intake 1000 ml  Output 2450 ml  Net -1450 ml   Filed Weights   11/03/20 0852  Weight: 50.7 kg    Examination:  GENERAL: Frail looking elderly male.  No apparent distress. HEENT: MMM.  Vision and hearing grossly intact.  NECK: Supple.  No apparent JVD.  RESP:  No IWOB.  Fair aeration  bilaterally. CVS:  RRR. Heart sounds normal.  ABD/GI/GU: BS+. Abd soft, NTND.  Ostomy bag with normal location fecal matter.  Clear looking urine in Foley. MSK/EXT:  Moves extremities.  Significant muscle mass loss SKIN: no apparent skin lesion or wound NEURO: Awake, alert and oriented appropriately.  No apparent focal neuro deficit. PSYCH: Calm. Normal affect.  Procedures:  None  Microbiology summarized: 1/6-influenza and COVID-19 PCR nonreactive. 1/6-urine culture with multiple species 1/6-blood culture NGTD. 1/7-urine cultures with multiple species.  Assessment & Plan: Sepsis probably from urinary source: POA. CT of the abdomen and pelvis significant for necrotic bladder mass.  Urine culture with multiple species x2.  Blood cultures NGTD.  Completed 2 weeks of ciprofloxacin on 11/17/2020 per urology recommendation.  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 -Per oncology,  poor candidate for neoadjuvant chemo, and recommended considering cystectomy prior to adjuvant chemo or immunotherapy versus radiation.  Hypercalcemia of malignancy: Calcium > 15 on admit>>> 10.6.  PTH within normal. -Received zoledronic acid.  -Continue IV fluids  Nausea/vomiting/abdominal distention/colostomy status-KUB on 1/19 with some concern for SBO but symptoms resolved.  GI symptoms could be due to hypocalcemia and opiates. -Continue Zofran, MiraLAX. -Continue IV fluid as above -Advance diet to soft  AKI: Baseline Cr 0.8.  Resolved. -IV fluid as above -Avoid nephrotoxic meds.  Leukocytosis: Related to malignancy? -Completed antibiotic course as above. -Continue monitoring  Hypokalemia: Resolved.  Hypophosphatemia -Replenish  and recheck  Anemia of chronic disease: Hgb relatively stable.  Anemia panel consistent with ACD Recent Labs    11/03/20 0230 11/03/20 0337 11/04/20 0740 11/05/20 0925  11/06/20 0500 11/09/20 1207 11/13/20 0512 11/16/20 0524 11/17/20 0549 11/18/20 0522  HGB 5.2* 10.9* 10.6* 11.0* 10.5* 11.3* 9.1* 10.3* 9.1* 8.8*  -Continue monitoring  Debility/physical deconditioning -Therapy recommended SNF.  Tobacco use disorder: -Nicotine patch  Severe protein calorie malnutrition Body mass index is 14.74 kg/m. Nutrition Problem: Severe Malnutrition Etiology: chronic illness,cancer and cancer related treatments Signs/Symptoms: severe fat depletion,severe muscle depletion,percent weight loss Interventions: Ensure Enlive (each supplement provides 350kcal and 20 grams of protein),MVI,Prostat   DVT prophylaxis:  SCDs Start: 11/03/20 2329  Code Status: Full code Family Communication: Patient and/or RN. Available if any question.  Status is: Inpatient  Remains inpatient appropriate because:Persistent severe electrolyte disturbances, Unsafe d/c plan, IV treatments appropriate due to intensity of illness or inability to take PO and Inpatient level of care appropriate due to severity of illness   Dispo: The patient is from: Home              Anticipated d/c is to: SNF              Anticipated d/c date is: 2 days              Patient currently is medically stable to d/c.       Consultants:  Urology Oncology   Sch Meds:  Scheduled Meds:  (feeding supplement) PROSource Plus  30 mL Oral BID WC   B-complex with vitamin C  1 tablet Oral Daily   Chlorhexidine Gluconate Cloth  6 each Topical Daily   feeding supplement  237 mL Oral TID BM   folic acid  1 mg Oral Daily   hydrocerin   Topical BID   nicotine  14 mg Transdermal Daily   polyethylene glycol  17 g Oral BID   cyanocobalamin  1,000 mcg Oral Daily   Continuous Infusions:  sodium chloride     sodium chloride 100 mL/hr at 11/18/20 0905   potassium PHOSPHATE IVPB (in mmol) 30 mmol (11/18/20 1041)   PRN Meds:.acetaminophen **OR** acetaminophen, alum & mag hydroxide-simeth,  hydrOXYzine, ondansetron **OR** ondansetron (ZOFRAN) IV, oxyCODONE-acetaminophen, sodium chloride flush, sodium chloride flush  Antimicrobials: Anti-infectives (From admission, onward)   Start     Dose/Rate Route Frequency Ordered Stop   11/12/20 2000  ciprofloxacin (CIPRO) tablet 500 mg        500 mg Oral 2 times daily 11/12/20 1543 11/17/20 2359   11/10/20 0800  ciprofloxacin (CIPRO) tablet 500 mg  Status:  Discontinued        500 mg Oral Daily with breakfast 11/09/20 1357 11/12/20 1543   11/06/20 1200  ciprofloxacin (CIPRO) tablet 500 mg  Status:  Discontinued        500 mg Oral 2 times daily 11/06/20 1114 11/09/20 1357   11/05/20 1500  cefdinir (OMNICEF) capsule 300 mg  Status:  Discontinued        300 mg Oral Every 12 hours 11/05/20 1337 11/06/20 1114   11/03/20 2200  vancomycin (VANCOREADY) IVPB 500 mg/100 mL  Status:  Discontinued        500 mg 100 mL/hr over 60 Minutes Intravenous Every 12 hours 11/03/20 1402 11/05/20 1335   11/03/20 1415  piperacillin-tazobactam (ZOSYN) IVPB 3.375 g  Status:  Discontinued        3.375 g 12.5 mL/hr over 240 Minutes Intravenous Every 8 hours 11/03/20 1401  11/05/20 1337   11/03/20 0645  vancomycin (VANCOCIN) IVPB 1000 mg/200 mL premix        1,000 mg 200 mL/hr over 60 Minutes Intravenous  Once 11/03/20 0631 11/03/20 1147   11/03/20 0600  piperacillin-tazobactam (ZOSYN) IVPB 4.5 g  Status:  Discontinued        4.5 g 200 mL/hr over 30 Minutes Intravenous  Once 11/03/20 0546 11/03/20 0547   11/03/20 0600  piperacillin-tazobactam (ZOSYN) IVPB 3.375 g        3.375 g 100 mL/hr over 30 Minutes Intravenous To Emergency Dept 11/03/20 0548 11/03/20 0650       I have personally reviewed the following labs and images: CBC: Recent Labs  Lab 11/13/20 0512 11/16/20 0524 11/17/20 0549 11/18/20 0522  WBC 14.0* 16.9* 16.0* 14.9*  NEUTROABS  --   --  11.0*  --   HGB 9.1* 10.3* 9.1* 8.8*  HCT 29.8* 33.8* 29.8* 29.2*  MCV 91.1 90.6 91.1 91.0  PLT 284  391 434* 421*   BMP &GFR Recent Labs  Lab 11/13/20 0512 11/14/20 0507 11/15/20 0522 11/16/20 0524 11/17/20 0549 11/18/20 0522  NA 140 141  --  139 139 137  K 3.2* 3.9  --  3.8 3.9 3.6  CL 113* 115*  --  109 114* 112*  CO2 20* 16*  --  21* 20* 19*  GLUCOSE 86 102*  --  108* 93 101*  BUN 18 16  --  19 19 12   CREATININE 1.05 1.03   0.96 1.05 0.99 1.09 0.88  CALCIUM 9.4 10.1  --  11.5* 11.0* 10.6*  MG  --   --   --   --  1.8 1.7  PHOS  --   --   --   --  2.0* 1.5*   Estimated Creatinine Clearance: 60 mL/min (by C-G formula based on SCr of 0.88 mg/dL). Liver & Pancreas: Recent Labs  Lab 11/17/20 0549 11/18/20 0522  AST 18  --   ALT 16  --   ALKPHOS 86  --   BILITOT 0.3  --   PROT 6.4*  --   ALBUMIN 2.2* 2.1*   Recent Labs  Lab 11/17/20 0549  LIPASE 35   No results for input(s): AMMONIA in the last 168 hours. Diabetic: No results for input(s): HGBA1C in the last 72 hours. Recent Labs  Lab 11/14/20 0521 11/15/20 0553 11/16/20 0514 11/17/20 0628 11/18/20 0407  GLUCAP 107* 106* 102* 82 100*   Cardiac Enzymes: No results for input(s): CKTOTAL, CKMB, CKMBINDEX, TROPONINI in the last 168 hours. No results for input(s): PROBNP in the last 8760 hours. Coagulation Profile: No results for input(s): INR, PROTIME in the last 168 hours. Thyroid Function Tests: No results for input(s): TSH, T4TOTAL, FREET4, T3FREE, THYROIDAB in the last 72 hours. Lipid Profile: No results for input(s): CHOL, HDL, LDLCALC, TRIG, CHOLHDL, LDLDIRECT in the last 72 hours. Anemia Panel: No results for input(s): VITAMINB12, FOLATE, FERRITIN, TIBC, IRON, RETICCTPCT in the last 72 hours. Urine analysis:    Component Value Date/Time   COLORURINE YELLOW 11/03/2020 0330   APPEARANCEUR TURBID (A) 11/03/2020 0330   LABSPEC 1.019 11/03/2020 0330   PHURINE 7.0 11/03/2020 0330   GLUCOSEU NEGATIVE 11/03/2020 0330   HGBUR NEGATIVE 11/03/2020 0330   BILIRUBINUR NEGATIVE 11/03/2020 0330   KETONESUR 5  (A) 11/03/2020 0330   PROTEINUR >=300 (A) 11/03/2020 0330   NITRITE NEGATIVE 11/03/2020 0330   LEUKOCYTESUR LARGE (A) 11/03/2020 0330   Sepsis Labs: Invalid input(s): PROCALCITONIN, LACTICIDVEN  Microbiology: No results found for this or any previous visit (from the past 240 hour(s)).  Radiology Studies: No results found.    Aimar Shrewsbury T. [place]  If 7PM-7AM, please contact night-coverage [email or web address] 11/18/2020, 1:07 PM

## 2020-11-19 DIAGNOSIS — A419 Sepsis, unspecified organism: Secondary | ICD-10-CM | POA: Diagnosis not present

## 2020-11-19 DIAGNOSIS — N390 Urinary tract infection, site not specified: Secondary | ICD-10-CM | POA: Diagnosis not present

## 2020-11-19 DIAGNOSIS — E43 Unspecified severe protein-calorie malnutrition: Secondary | ICD-10-CM | POA: Diagnosis not present

## 2020-11-19 DIAGNOSIS — C689 Malignant neoplasm of urinary organ, unspecified: Secondary | ICD-10-CM | POA: Diagnosis not present

## 2020-11-19 LAB — CBC
HCT: 31.6 % — ABNORMAL LOW (ref 39.0–52.0)
Hemoglobin: 9.8 g/dL — ABNORMAL LOW (ref 13.0–17.0)
MCH: 28.1 pg (ref 26.0–34.0)
MCHC: 31 g/dL (ref 30.0–36.0)
MCV: 90.5 fL (ref 80.0–100.0)
Platelets: 491 10*3/uL — ABNORMAL HIGH (ref 150–400)
RBC: 3.49 MIL/uL — ABNORMAL LOW (ref 4.22–5.81)
RDW: 15.8 % — ABNORMAL HIGH (ref 11.5–15.5)
WBC: 17.4 10*3/uL — ABNORMAL HIGH (ref 4.0–10.5)
nRBC: 0 % (ref 0.0–0.2)

## 2020-11-19 LAB — RENAL FUNCTION PANEL
Albumin: 2.2 g/dL — ABNORMAL LOW (ref 3.5–5.0)
Anion gap: 7 (ref 5–15)
BUN: 10 mg/dL (ref 8–23)
CO2: 20 mmol/L — ABNORMAL LOW (ref 22–32)
Calcium: 10.5 mg/dL — ABNORMAL HIGH (ref 8.9–10.3)
Chloride: 109 mmol/L (ref 98–111)
Creatinine, Ser: 0.81 mg/dL (ref 0.61–1.24)
GFR, Estimated: >60 mL/min (ref >60)
Glucose, Bld: 88 mg/dL (ref 70–99)
Phosphorus: 2.3 mg/dL — ABNORMAL LOW (ref 2.5–4.6)
Potassium: 3.8 mmol/L (ref 3.5–5.1)
Sodium: 136 mmol/L (ref 135–145)

## 2020-11-19 LAB — MAGNESIUM: Magnesium: 1.6 mg/dL — ABNORMAL LOW (ref 1.7–2.4)

## 2020-11-19 MED ORDER — MAGNESIUM SULFATE 2 GM/50ML IV SOLN
2.0000 g | Freq: Once | INTRAVENOUS | Status: AC
  Administered 2020-11-19: 2 g via INTRAVENOUS
  Filled 2020-11-19: qty 50

## 2020-11-19 NOTE — Progress Notes (Signed)
Pt asked for a gun to end it all. Charge and provider notified.

## 2020-11-19 NOTE — Progress Notes (Signed)
PROGRESS NOTE  Jamie Wilson [id number] DOB: 02-27-1956   PCP: Patient, [name]  Patient is from: Home  DOA: 11/02/2020 LOS: 24  Chief complaints: Abdominal pain  Brief Narrative / Interim history: 65 y.o.malewith medical history significant ofbladder CA w/ chronic foley placement; bowel obstruction s/p partial bowel resection w/ colostomy. Patient presented secondary to abdominal pain and found to have evidence of sepsis possibly secondary to necrotic bladder mass. Empiric antibiotics initiated. Urology and oncology consulted aand recommendations given. Hospital course complicated by hypercalcemia,.. Bone scan does not show any lytic or blastic bone lesions suspicious for bone mets. AKI resolved.  Still with some degree of hypercalcemia.  He also had some GI symptoms including nausea, emesis and abdominal distention.  KUB on 1/19 raise concern for SBO but GI symptoms resolved the next day.  Advanced diet to soft.  Therapy recommended SNF when he is medically stable.  Subjective: Seen and examined earlier this morning.  Per RN report, patient was unpleasant with the staff overnight.  Reportedly "asked for gun to end all".  He was also trying to get out of the bed.  One-to-one safety precaution ordered.  This morning, he reports 10 out of 10 pain although he does not appear to be in that much pain.  He also reports nausea but no emesis.  Anxious about eating due to abdominal pain.  When asked about gun and suicidal thoughts, he says he was just dreaming.  He denies suicidal ideation.   Objective: Vitals:   11/18/20 1420 11/18/20 2137 11/19/20 0436 11/19/20 0632  BP: 110/74 127/77 125/77 123/75  Pulse: 94 90 (!) 103 (!) 106  Resp: 20 18 15 16   Temp: 97.7 F (36.5 C) 97.7 F (36.5 C) 98.3 F (36.8 C) 98.3 F (36.8 C)  TempSrc: Oral Oral  Oral  SpO2: 97% 94% 95% 95%  Weight:      Height:        Intake/Output Summary (Last 24 hours) at 11/19/2020 1235 Last data filed at  11/19/2020 1000 Gross per 24 hour  Intake 1872.15 ml  Output 2900 ml  Net -1027.85 ml   Filed Weights   11/03/20 0852  Weight: 50.7 kg    Examination:  GENERAL: Frail looking elderly male.  No apparent distress. HEENT: MMM.  Vision and hearing grossly intact.  NECK: Supple.  No apparent JVD.  RESP:  No IWOB.  Fair aeration bilaterally. CVS:  RRR. Heart sounds normal.  ABD/GI/GU: BS+. Abd soft, NTND.  Ostomy bag with normal stool.  Foley in place MSK/EXT:  Moves extremities.  Significant muscle mass and subcu fat loss SKIN: no apparent skin lesion or wound NEURO: Awake, alert and oriented appropriately.  No apparent focal neuro deficit. PSYCH: Calm.  Looks unhappy  Procedures:  None  Microbiology summarized: 1/6-influenza and COVID-19 PCR nonreactive. 1/6-urine culture with multiple species 1/6-blood culture NGTD. 1/7-urine cultures with multiple species.  Assessment & Plan: Debility/physical deconditioning/increased risk of fall-noncompliance.  Tries to get out of the bed -May de-escalate to tele sitter -Therapy recommended SNF.  Sepsis probably from urinary source: POA. CT of the abdomen and pelvis significant for necrotic bladder mass.  Urine culture with multiple species x2.  Blood cultures NGTD.  Completed 2 weeks of ciprofloxacin on 11/17/2020 per urology recommendation.  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 -Per oncology,  poor candidate for neoadjuvant chemo, and recommended considering cystectomy prior to adjuvant chemo or immunotherapy versus radiation.  Hypercalcemia of malignancy: Calcium > 15 on admit>>> 10.5.  PTH within normal. -Received zoledronic acid.  -Continue IV NS.  Reduce rate to 50  Nausea/vomiting/abdominal distention/colostomy status-KUB on 1/19 with some concern for SBO but symptoms resolved.  GI symptoms could be due to hypocalcemia and  opiates. -Continue Zofran, MiraLAX. -Continue IV fluid-reduce rate -Continue soft diet  AKI: Baseline Cr 0.8.  Resolved. -IV fluid as above -Avoid nephrotoxic meds.  Leukocytosis: Related to malignancy? -Completed antibiotic course as above. -Continue monitoring  Hypokalemia/hypomagnesemia/hypophosphatemia:  -Replenish and recheck as appropriate.  Anemia of chronic disease: Hgb relatively stable.  Anemia panel consistent with ACD Recent Labs    11/03/20 0337 11/04/20 0740 11/05/20 0925 11/06/20 0500 11/09/20 1207 11/13/20 0512 11/16/20 0524 11/17/20 0549 11/18/20 0522 11/19/20 0102  HGB 10.9* 10.6* 11.0* 10.5* 11.3* 9.1* 10.3* 9.1* 8.8* 9.8*  -Continue monitoring  Tobacco use disorder: -Nicotine patch  Severe protein calorie malnutrition Body mass index is 14.74 kg/m. Nutrition Problem: Severe Malnutrition Etiology: chronic illness,cancer and cancer related treatments Signs/Symptoms: severe fat depletion,severe muscle depletion,percent weight loss Interventions: Ensure Enlive (each supplement provides 350kcal and 20 grams of protein),MVI,Prostat   DVT prophylaxis:  SCDs Start: 11/03/20 2329  Code Status: Full code Family Communication: Patient and/or RN. Available if any question.  Status is: Inpatient  Remains inpatient appropriate because:Persistent severe electrolyte disturbances, Unsafe d/c plan, IV treatments appropriate due to intensity of illness or inability to take PO and Inpatient level of care appropriate due to severity of illness   Dispo: The patient is from: Home              Anticipated d/c is to: SNF              Anticipated d/c date is: 2 days              Patient currently is medically stable to d/c.       Consultants:  Urology Oncology   Sch Meds:  Scheduled Meds: . (feeding supplement) PROSource Plus  30 mL Oral BID WC  . B-complex with vitamin C  1 tablet Oral Daily  . Chlorhexidine Gluconate Cloth  6 each Topical Daily   . feeding supplement  237 mL Oral TID BM  . folic acid  1 mg Oral Daily  . hydrocerin   Topical BID  . nicotine  14 mg Transdermal Daily  . polyethylene glycol  17 g Oral BID  . cyanocobalamin  1,000 mcg Oral Daily   Continuous Infusions: . sodium chloride    . sodium chloride 50 mL/hr at 11/19/20 0924   PRN Meds:.acetaminophen **OR** acetaminophen, alum & mag hydroxide-simeth, hydrOXYzine, ondansetron **OR** ondansetron (ZOFRAN) IV, oxyCODONE-acetaminophen, sodium chloride flush, sodium chloride flush  Antimicrobials: Anti-infectives (From admission, onward)   Start     Dose/Rate Route Frequency Ordered Stop   11/12/20 2000  ciprofloxacin (CIPRO) tablet 500 mg        500 mg Oral 2 times daily 11/12/20 1543 11/17/20 2359   11/10/20 0800  ciprofloxacin (CIPRO) tablet 500 mg  Status:  Discontinued        500 mg Oral Daily with breakfast 11/09/20 1357 11/12/20 1543   11/06/20 1200  ciprofloxacin (CIPRO) tablet 500 mg  Status:  Discontinued        500 mg Oral 2 times daily 11/06/20 1114 11/09/20 1357   11/05/20 1500  cefdinir (OMNICEF) capsule 300 mg  Status:  Discontinued        300 mg Oral Every 12 hours 11/05/20  1337 11/06/20 1114   11/03/20 2200  vancomycin (VANCOREADY) IVPB 500 mg/100 mL  Status:  Discontinued        500 mg 100 mL/hr over 60 Minutes Intravenous Every 12 hours 11/03/20 1402 11/05/20 1335   11/03/20 1415  piperacillin-tazobactam (ZOSYN) IVPB 3.375 g  Status:  Discontinued        3.375 g 12.5 mL/hr over 240 Minutes Intravenous Every 8 hours 11/03/20 1401 11/05/20 1337   11/03/20 0645  vancomycin (VANCOCIN) IVPB 1000 mg/200 mL premix        1,000 mg 200 mL/hr over 60 Minutes Intravenous  Once 11/03/20 0631 11/03/20 1147   11/03/20 0600  piperacillin-tazobactam (ZOSYN) IVPB 4.5 g  Status:  Discontinued        4.5 g 200 mL/hr over 30 Minutes Intravenous  Once 11/03/20 0546 11/03/20 0547   11/03/20 0600  piperacillin-tazobactam (ZOSYN) IVPB 3.375 g        3.375  g 100 mL/hr over 30 Minutes Intravenous To Emergency Dept 11/03/20 0548 11/03/20 0650       I have personally reviewed the following labs and images: CBC: Recent Labs  Lab 11/13/20 0512 11/16/20 0524 11/17/20 0549 11/18/20 0522 11/19/20 0102  WBC 14.0* 16.9* 16.0* 14.9* 17.4*  NEUTROABS  --   --  11.0*  --   --   HGB 9.1* 10.3* 9.1* 8.8* 9.8*  HCT 29.8* 33.8* 29.8* 29.2* 31.6*  MCV 91.1 90.6 91.1 91.0 90.5  PLT 284 391 434* 421* 491*   BMP &GFR Recent Labs  Lab 11/14/20 0507 11/15/20 0522 11/16/20 0524 11/17/20 0549 11/18/20 0522 11/19/20 0102  NA 141  --  139 139 137 136  K 3.9  --  3.8 3.9 3.6 3.8  CL 115*  --  109 114* 112* 109  CO2 16*  --  21* 20* 19* 20*  GLUCOSE 102*  --  108* 93 101* 88  BUN 16  --  19 19 12 10   CREATININE 1.03  0.96 1.05 0.99 1.09 0.88 0.81  CALCIUM 10.1  --  11.5* 11.0* 10.6* 10.5*  MG  --   --   --  1.8 1.7 1.6*  PHOS  --   --   --  2.0* 1.5* 2.3*   Estimated Creatinine Clearance: 65.2 mL/min (by C-G formula based on SCr of 0.81 mg/dL). Liver & Pancreas: Recent Labs  Lab 11/17/20 0549 11/18/20 0522 11/19/20 0102  AST 18  --   --   ALT 16  --   --   ALKPHOS 86  --   --   BILITOT 0.3  --   --   PROT 6.4*  --   --   ALBUMIN 2.2* 2.1* 2.2*   Recent Labs  Lab 11/17/20 0549  LIPASE 35   No results for input(s): AMMONIA in the last 168 hours. Diabetic: No results for input(s): HGBA1C in the last 72 hours. Recent Labs  Lab 11/14/20 0521 11/15/20 0553 11/16/20 0514 11/17/20 0628 11/18/20 0407  GLUCAP 107* 106* 102* 82 100*   Cardiac Enzymes: No results for input(s): CKTOTAL, CKMB, CKMBINDEX, TROPONINI in the last 168 hours. No results for input(s): PROBNP in the last 8760 hours. Coagulation Profile: No results for input(s): INR, PROTIME in the last 168 hours. Thyroid Function Tests: No results for input(s): TSH, T4TOTAL, FREET4, T3FREE, THYROIDAB in the last 72 hours. Lipid Profile: No results for input(s): CHOL,  HDL, LDLCALC, TRIG, CHOLHDL, LDLDIRECT in the last 72 hours. Anemia Panel: No results for input(s):  VITAMINB12, FOLATE, FERRITIN, TIBC, IRON, RETICCTPCT in the last 72 hours. Urine analysis:    Component Value Date/Time   COLORURINE YELLOW 11/03/2020 0330   APPEARANCEUR TURBID (A) 11/03/2020 0330   LABSPEC 1.019 11/03/2020 0330   PHURINE 7.0 11/03/2020 0330   GLUCOSEU NEGATIVE 11/03/2020 0330   HGBUR NEGATIVE 11/03/2020 0330   BILIRUBINUR NEGATIVE 11/03/2020 0330   KETONESUR 5 (A) 11/03/2020 0330   PROTEINUR >=300 (A) 11/03/2020 0330   NITRITE NEGATIVE 11/03/2020 0330   LEUKOCYTESUR LARGE (A) 11/03/2020 0330   Sepsis Labs: Invalid input(s): PROCALCITONIN, [place]  Microbiology: No results found for this or any previous visit (from the past 240 hour(s)).  Radiology Studies: No results found.    Conita Amenta T. Atwood  If 7PM-7AM, please contact night-coverage [email or web address] 11/19/2020, 12:35 PM

## 2020-11-19 NOTE — Progress Notes (Signed)
Found pills that pt had stashed inside his blue emesis container. These appear to be his pain pills, broken in half . . (2 halves of a blue Percocet). Per sitter that was w the pt today, he appears to be hiding pills under his tongue and is acting like he is swallowing them. The above meds wasted in [place], and will pass along to other staff to watch pt take his meds. Instructed pt that he must take his meds when we bring them in, pt verb understanding.

## 2020-11-20 DIAGNOSIS — C689 Malignant neoplasm of urinary organ, unspecified: Secondary | ICD-10-CM | POA: Diagnosis not present

## 2020-11-20 DIAGNOSIS — E43 Unspecified severe protein-calorie malnutrition: Secondary | ICD-10-CM | POA: Diagnosis not present

## 2020-11-20 DIAGNOSIS — A419 Sepsis, unspecified organism: Secondary | ICD-10-CM | POA: Diagnosis not present

## 2020-11-20 DIAGNOSIS — R1312 Dysphagia, oropharyngeal phase: Secondary | ICD-10-CM

## 2020-11-20 DIAGNOSIS — N390 Urinary tract infection, site not specified: Secondary | ICD-10-CM | POA: Diagnosis not present

## 2020-11-20 LAB — RENAL FUNCTION PANEL
Albumin: 2.2 g/dL — ABNORMAL LOW (ref 3.5–5.0)
Anion gap: 5 (ref 5–15)
BUN: 10 mg/dL (ref 8–23)
CO2: 20 mmol/L — ABNORMAL LOW (ref 22–32)
Calcium: 11.2 mg/dL — ABNORMAL HIGH (ref 8.9–10.3)
Chloride: 111 mmol/L (ref 98–111)
Creatinine, Ser: 0.84 mg/dL (ref 0.61–1.24)
GFR, Estimated: >60 mL/min (ref >60)
Glucose, Bld: 95 mg/dL (ref 70–99)
Phosphorus: 2 mg/dL — ABNORMAL LOW (ref 2.5–4.6)
Potassium: 3.7 mmol/L (ref 3.5–5.1)
Sodium: 136 mmol/L (ref 135–145)

## 2020-11-20 LAB — CBC
HCT: 32.3 % — ABNORMAL LOW (ref 39.0–52.0)
Hemoglobin: 9.8 g/dL — ABNORMAL LOW (ref 13.0–17.0)
MCH: 27.8 pg (ref 26.0–34.0)
MCHC: 30.3 g/dL (ref 30.0–36.0)
MCV: 91.5 fL (ref 80.0–100.0)
Platelets: 530 10*3/uL — ABNORMAL HIGH (ref 150–400)
RBC: 3.53 MIL/uL — ABNORMAL LOW (ref 4.22–5.81)
RDW: 15.7 % — ABNORMAL HIGH (ref 11.5–15.5)
WBC: 19.3 10*3/uL — ABNORMAL HIGH (ref 4.0–10.5)
nRBC: 0 % (ref 0.0–0.2)

## 2020-11-20 LAB — GLUCOSE, CAPILLARY: Glucose-Capillary: 84 mg/dL (ref 70–99)

## 2020-11-20 LAB — MAGNESIUM: Magnesium: 2 mg/dL (ref 1.7–2.4)

## 2020-11-20 MED ORDER — ACETAMINOPHEN 160 MG/5ML PO SOLN
650.0000 mg | Freq: Four times a day (QID) | ORAL | Status: DC | PRN
  Administered 2020-11-20 – 2020-11-22 (×5): 650 mg via ORAL
  Filled 2020-11-20 (×6): qty 20.3

## 2020-11-20 MED ORDER — POLYETHYLENE GLYCOL 3350 17 G PO PACK: 17.0000 g | PACK | Freq: Every day | ORAL | Status: DC | PRN

## 2020-11-20 MED ORDER — ACETAMINOPHEN FOR CIRCUMCISION 160 MG/5 ML: 640.0000 mg | Freq: Four times a day (QID) | ORAL | Status: DC | PRN

## 2020-11-20 MED ORDER — OXYCODONE HCL 5 MG/5ML PO SOLN
5.0000 mg | Freq: Four times a day (QID) | ORAL | Status: DC | PRN
  Administered 2020-11-20 – 2020-11-21 (×4): 5 mg via ORAL
  Administered 2020-11-21: 20:00:00 7.5 mg via ORAL
  Administered 2020-11-22 – 2020-11-23 (×3): 5 mg via ORAL
  Filled 2020-11-20 (×3): qty 5
  Filled 2020-11-20: qty 10
  Filled 2020-11-20 (×4): qty 5

## 2020-11-20 NOTE — Evaluation (Signed)
Clinical/Bedside Swallow Evaluation Patient Details  Name: Jamie Wilson MRN: [id number] Date of Birth: December 04, 1955  Today's Date: 11/20/2020 Time: SLP Start Time (ACUTE ONLY): 57 SLP Stop Time (ACUTE ONLY): 1430 SLP Time Calculation (min) (ACUTE ONLY): 15 min  Past Medical History:  Past Medical History:  Diagnosis Date  . Bladder cancer ([place])   . Colostomy care ([place])   . Toe infection    Past Surgical History:  Past Surgical History:  Procedure Laterality Date  . ABDOMINAL SURGERY    . CYSTOSCOPY/URETEROSCOPY/HOLMIUM LASER/STENT PLACEMENT Right 10/06/2020   Procedure: CYSTO;  Surgeon: Irine Seal, MD;  Location: [facility];  Service: Urology;  Laterality: Right;  . TRANSURETHRAL RESECTION OF BLADDER TUMOR N/A 10/06/2020   Procedure: POSSIBLE TRANSURETHRAL RESECTION OF BLADDER TUMOR (TURBT);  Surgeon: Irine Seal, MD;  Location: [facility];  Service: Urology;  Laterality: N/A;   HPI:  65 y.o. 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   Assessment / Plan / Recommendation Clinical Impression  Patient presents with a suspected primary esophageal dysphagia characterized by normal oropharyngeal function but with complaints of globus, primarily with pills, consistent with h/o GERD (untreated per patient). Provided patient with general safe swallowing and reflux precautions. Reinforced the importance of consuming medication as directed and by any means necessary (whole, crushing, etc). Patient verbalized understanding but also repeating that he would just like meds in liquid form or IV so that they "work faster." Explained that this is not a long term solution. Would provide meds whole with liquid unless patient resisting, then attempt whole in puree, then crushed in puree if needed. No further SLP  needs indicated. SLP Visit Diagnosis: Dysphagia, unspecified (R13.10)    Aspiration Risk  No limitations     Diet Recommendation Regular;Thin liquid   Liquid Administration via: Cup;Straw Medication Administration: Whole meds with liquid Supervision: Patient able to self feed Compensations: Slow rate;Small sips/bites;Follow solids with liquid Postural Changes: Remain upright for at least 30 minutes after po intake;Seated upright at 90 degrees    Other  Recommendations Oral Care Recommendations: Oral care BID   Follow up Recommendations None                 Swallow Study   General HPI: 65 y.o. 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 Type of Study: Bedside Swallow Evaluation Previous Swallow Assessment: none Diet Prior to this Study: Regular;Thin liquids Temperature Spikes Noted: No Respiratory Status: Room air History of Recent Intubation: No Behavior/Cognition: Alert;Cooperative Oral Cavity Assessment: Within Functional Limits Oral Care Completed by SLP: No Oral Cavity - Dentition: Poor condition;Missing dentition Vision: Functional for self-feeding Self-Feeding Abilities: Able to feed self Patient Positioning: Upright in bed Baseline Vocal Quality: Normal Volitional Cough: Strong Volitional Swallow: Able to elicit    Oral/Motor/Sensory Function Overall Oral Motor/Sensory Function: Within functional limits   Ice Chips Ice chips: Not tested   Thin Liquid Thin Liquid: Within functional limits Presentation: Self Fed;Straw    Nectar Thick Nectar Thick Liquid: Not tested   Honey Thick Honey Thick Liquid: Not tested   Puree Puree: Within functional limits Presentation: Spoon   Solid     Solid: Within functional limits Presentation: [place] MA, CCC-SLP   Jamie Wilson Meryl 11/20/2020,2:30 PM

## 2020-11-20 NOTE — Progress Notes (Signed)
PROGRESS NOTE  Jamie Wilson [id number] DOB: 1956-02-12   PCP: Patient, [name]  Patient is from: Home  DOA: 11/02/2020 LOS: 25  Chief complaints: Abdominal pain  Brief Narrative / Interim history: 65 y.o.malewith medical history significant ofbladder CA w/ chronic foley placement; bowel obstruction s/p partial bowel resection w/ colostomy. Patient presented secondary to abdominal pain and found to have evidence of sepsis possibly secondary to necrotic bladder mass. Empiric antibiotics initiated. Urology and oncology consulted aand recommendations given. Hospital course complicated by hypercalcemia,.. Bone scan does not show any lytic or blastic bone lesions suspicious for bone mets. AKI resolved.  Still with some degree of hypercalcemia.  He also had some GI symptoms including nausea, emesis and abdominal distention.  KUB on 1/19 raise concern for SBO but GI symptoms resolved the next day.  Advanced diet to soft.  Therapy recommended SNF when he is medically stable.  Subjective: No major events overnight or this morning.  Continues to endorse abdominal pain that he rates as "real bad".  Also reports difficulty swallowing pills.  Also upset about dietary service in the hospital.  Objective: Vitals:   11/19/20 0632 11/19/20 1334 11/19/20 2046 11/20/20 0527  BP: 123/75 114/67 125/76 116/76  Pulse: (!) 106 95 (!) 105 88  Resp: 16 16 18 18   Temp: 98.3 F (36.8 C) 98.1 F (36.7 C) 98.4 F (36.9 C) 97.8 F (36.6 C)  TempSrc: Oral Oral Oral Oral  SpO2: 95% 97% 95% 100%  Weight:      Height:        Intake/Output Summary (Last 24 hours) at 11/20/2020 1114 Last data filed at 11/20/2020 1000 Gross per 24 hour  Intake 2609.85 ml  Output 4900 ml  Net -2290.15 ml   Filed Weights   11/03/20 0852  Weight: 50.7 kg    Examination:  GENERAL: Frail looking elderly male.  No apparent distress. HEENT: MMM.  Vision and hearing grossly intact.  NECK: Supple.  No apparent JVD.  RESP:  On RA.  No IWOB.  Fair aeration bilaterally. CVS:  RRR. Heart sounds normal.  ABD/GI/GU: BS+. Abd soft.  Ostomy bag with normal stool.  Foley in place. MSK/EXT:  Moves extremities. No apparent deformity. No edema.  SKIN: no apparent skin lesion or wound NEURO: Awake, alert and oriented appropriately.  No apparent focal neuro deficit. PSYCH: Somewhat unhappy  Procedures:  None  Microbiology summarized: 1/6-influenza and COVID-19 PCR nonreactive. 1/6-urine culture with multiple species 1/6-blood culture NGTD. 1/7-urine cultures with multiple species.  Assessment & Plan: Debility/physical deconditioning/increased risk of fall-noncompliance.  -Therapy recommended SNF for ongoing rehabilitation.  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 -Per oncology,  poor candidate for neoadjuvant chemo, and recommended considering cystectomy prior to adjuvant chemo or immunotherapy versus radiation.  Hypercalcemia of malignancy: Calcium > 15 on admit>>> 11.2.  PTH within normal. -Received zoledronic acid.  -Continue IV NS.   Nausea/vomiting/abdominal distention/colostomy status-resolved except for some abdominal pain.  Seems to have a lot of ostomy output.  May not able to maintain adequate hydration by p.o. -Continue IV fluid -Continue soft diet -Change MiraLAX to as needed  Dysphagia?  Reports difficulty swallowing pills -SLP eval  Sepsis probably from urinary source: POA. CT of the abdomen and pelvis significant for necrotic bladder mass.  Urine culture with multiple species x2.  Blood cultures NGTD.  Completed 2 weeks of ciprofloxacin on 11/17/2020 per urology recommendation.  AKI: Baseline Cr 0.8.  Resolved. -  IV fluid as above -Avoid nephrotoxic meds.  Leukocytosis: Related to malignancy? -Completed antibiotic course as above. -Continue  monitoring  Hypokalemia/hypomagnesemia/hypophosphatemia: Resolved except for mild hypophosphatemia -Monitor and replenish as appropriate  Anemia of chronic disease: Hgb relatively stable.  Anemia panel consistent with ACD Recent Labs    11/04/20 0740 11/05/20 0925 11/06/20 0500 11/09/20 1207 11/13/20 0512 11/16/20 0524 11/17/20 0549 11/18/20 0522 11/19/20 0102 11/20/20 0537  HGB 10.6* 11.0* 10.5* 11.3* 9.1* 10.3* 9.1* 8.8* 9.8* 9.8*  -Continue monitoring  Tobacco use disorder: -Nicotine patch  Severe protein calorie malnutrition Body mass index is 14.74 kg/m. Nutrition Problem: Severe Malnutrition Etiology: chronic illness,cancer and cancer related treatments Signs/Symptoms: severe fat depletion,severe muscle depletion,percent weight loss Interventions: Ensure Enlive (each supplement provides 350kcal and 20 grams of protein),MVI,Prostat    Wt Readings from Last 10 Encounters:  11/03/20 50.7 kg  10/12/20 58.1 kg  08/28/20 61.1 kg  07/20/20 68 kg  06/26/20 68 kg  04/03/20 72.6 kg  10/17/19 72.6 kg   DVT prophylaxis:  SCDs Start: 11/03/20 2329  Code Status: Full code Family Communication: Patient and/or RN. Available if any question.  Status is: Inpatient  Remains inpatient appropriate because:Unsafe d/c plan, IV treatments appropriate due to intensity of illness or inability to take PO and Inpatient level of care appropriate due to severity of illness   Dispo: The patient is from: Home              Anticipated d/c is to: SNF              Anticipated d/c date is: 2 days              Patient currently is medically stable to d/c.       Consultants:  Urology Oncology   Sch Meds:  Scheduled Meds: . (feeding supplement) PROSource Plus  30 mL Oral BID WC  . B-complex with vitamin C  1 tablet Oral Daily  . Chlorhexidine Gluconate Cloth  6 each Topical Daily  . feeding supplement  237 mL Oral TID BM  . folic acid  1 mg Oral Daily  . hydrocerin   Topical  BID  . nicotine  14 mg Transdermal Daily  . cyanocobalamin  1,000 mcg Oral Daily   Continuous Infusions: . sodium chloride    . sodium chloride 50 mL/hr at 11/19/20 1400   PRN Meds:.acetaminophen **OR** acetaminophen, alum & mag hydroxide-simeth, hydrOXYzine, ondansetron **OR** ondansetron (ZOFRAN) IV, oxyCODONE-acetaminophen, polyethylene glycol, sodium chloride flush, sodium chloride flush  Antimicrobials: Anti-infectives (From admission, onward)   Start     Dose/Rate Route Frequency Ordered Stop   11/12/20 2000  ciprofloxacin (CIPRO) tablet 500 mg        500 mg Oral 2 times daily 11/12/20 1543 11/17/20 2359   11/10/20 0800  ciprofloxacin (CIPRO) tablet 500 mg  Status:  Discontinued        500 mg Oral Daily with breakfast 11/09/20 1357 11/12/20 1543   11/06/20 1200  ciprofloxacin (CIPRO) tablet 500 mg  Status:  Discontinued        500 mg Oral 2 times daily 11/06/20 1114 11/09/20 1357   11/05/20 1500  cefdinir (OMNICEF) capsule 300 mg  Status:  Discontinued        300 mg Oral Every 12 hours 11/05/20 1337 11/06/20 1114   11/03/20 2200  vancomycin (VANCOREADY) IVPB 500 mg/100 mL  Status:  Discontinued        500 mg 100 mL/hr over 60 Minutes Intravenous Every 12 hours  11/03/20 1402 11/05/20 1335   11/03/20 1415  piperacillin-tazobactam (ZOSYN) IVPB 3.375 g  Status:  Discontinued        3.375 g 12.5 mL/hr over 240 Minutes Intravenous Every 8 hours 11/03/20 1401 11/05/20 1337   11/03/20 0645  vancomycin (VANCOCIN) IVPB 1000 mg/200 mL premix        1,000 mg 200 mL/hr over 60 Minutes Intravenous  Once 11/03/20 0631 11/03/20 1147   11/03/20 0600  piperacillin-tazobactam (ZOSYN) IVPB 4.5 g  Status:  Discontinued        4.5 g 200 mL/hr over 30 Minutes Intravenous  Once 11/03/20 0546 11/03/20 0547   11/03/20 0600  piperacillin-tazobactam (ZOSYN) IVPB 3.375 g        3.375 g 100 mL/hr over 30 Minutes Intravenous To Emergency Dept 11/03/20 0548 11/03/20 0650       I have personally  reviewed the following labs and images: CBC: Recent Labs  Lab 11/16/20 0524 11/17/20 0549 11/18/20 0522 11/19/20 0102 11/20/20 0537  WBC 16.9* 16.0* 14.9* 17.4* 19.3*  NEUTROABS  --  11.0*  --   --   --   HGB 10.3* 9.1* 8.8* 9.8* 9.8*  HCT 33.8* 29.8* 29.2* 31.6* 32.3*  MCV 90.6 91.1 91.0 90.5 91.5  PLT 391 434* 421* 491* 530*   BMP &GFR Recent Labs  Lab 11/16/20 0524 11/17/20 0549 11/18/20 0522 11/19/20 0102 11/20/20 0537  NA 139 139 137 136 136  K 3.8 3.9 3.6 3.8 3.7  CL 109 114* 112* 109 111  CO2 21* 20* 19* 20* 20*  GLUCOSE 108* 93 101* 88 95  BUN 19 19 12 10 10   CREATININE 0.99 1.09 0.88 0.81 0.84  CALCIUM 11.5* 11.0* 10.6* 10.5* 11.2*  MG  --  1.8 1.7 1.6* 2.0  PHOS  --  2.0* 1.5* 2.3* 2.0*   Estimated Creatinine Clearance: 62.9 mL/min (by C-G formula based on SCr of 0.84 mg/dL). Liver & Pancreas: Recent Labs  Lab 11/17/20 0549 11/18/20 0522 11/19/20 0102 11/20/20 0537  AST 18  --   --   --   ALT 16  --   --   --   ALKPHOS 86  --   --   --   BILITOT 0.3  --   --   --   PROT 6.4*  --   --   --   ALBUMIN 2.2* 2.1* 2.2* 2.2*   Recent Labs  Lab 11/17/20 0549  LIPASE 35   No results for input(s): AMMONIA in the last 168 hours. Diabetic: No results for input(s): HGBA1C in the last 72 hours. Recent Labs  Lab 11/15/20 0553 11/16/20 0514 11/17/20 0628 11/18/20 0407 11/20/20 0514  GLUCAP 106* 102* 82 100* 84   Cardiac Enzymes: No results for input(s): CKTOTAL, CKMB, CKMBINDEX, TROPONINI in the last 168 hours. No results for input(s): PROBNP in the last 8760 hours. Coagulation Profile: No results for input(s): INR, PROTIME in the last 168 hours. Thyroid Function Tests: No results for input(s): TSH, T4TOTAL, FREET4, T3FREE, THYROIDAB in the last 72 hours. Lipid Profile: No results for input(s): CHOL, HDL, LDLCALC, TRIG, CHOLHDL, LDLDIRECT in the last 72 hours. Anemia Panel: No results for input(s): VITAMINB12, FOLATE, FERRITIN, TIBC, IRON,  RETICCTPCT in the last 72 hours. Urine analysis:    Component Value Date/Time   COLORURINE YELLOW 11/03/2020 0330   APPEARANCEUR TURBID (A) 11/03/2020 0330   LABSPEC 1.019 11/03/2020 0330   PHURINE 7.0 11/03/2020 0330   GLUCOSEU NEGATIVE 11/03/2020 0330   HGBUR NEGATIVE  11/03/2020 0330   BILIRUBINUR NEGATIVE 11/03/2020 0330   KETONESUR 5 (A) 11/03/2020 0330   PROTEINUR >=300 (A) 11/03/2020 0330   NITRITE NEGATIVE 11/03/2020 0330   LEUKOCYTESUR LARGE (A) 11/03/2020 0330   Sepsis Labs: Invalid input(s): PROCALCITONIN, [place]  Microbiology: No results found for this or any previous visit (from the past 240 hour(s)).  Radiology Studies: No results found.    Jamie Wilson  If 7PM-7AM, please contact night-coverage [email or web address] 11/20/2020, 11:14 AM

## 2020-11-21 DIAGNOSIS — N390 Urinary tract infection, site not specified: Secondary | ICD-10-CM | POA: Diagnosis not present

## 2020-11-21 DIAGNOSIS — A419 Sepsis, unspecified organism: Secondary | ICD-10-CM | POA: Diagnosis not present

## 2020-11-21 DIAGNOSIS — E43 Unspecified severe protein-calorie malnutrition: Secondary | ICD-10-CM | POA: Diagnosis not present

## 2020-11-21 DIAGNOSIS — C689 Malignant neoplasm of urinary organ, unspecified: Secondary | ICD-10-CM | POA: Diagnosis not present

## 2020-11-21 LAB — GLUCOSE, CAPILLARY: Glucose-Capillary: 92 mg/dL (ref 70–99)

## 2020-11-21 MED ORDER — BELLADONNA ALKALOIDS-OPIUM 16.2-30 MG RE SUPP
1.0000 | Freq: Once | RECTAL | Status: DC
  Filled 2020-11-21: qty 1

## 2020-11-21 MED ORDER — TAMSULOSIN HCL 0.4 MG PO CAPS
0.4000 mg | ORAL_CAPSULE | Freq: Every evening | ORAL | Status: DC
  Administered 2020-11-21 – 2020-12-21 (×31): 0.4 mg via ORAL
  Filled 2020-11-21 (×32): qty 1

## 2020-11-21 NOTE — TOC Progression Note (Signed)
Transition of Care [facility]) - Progression Note    Patient Details  Name: Jamie Wilson MRN: [id number] Date of Birth: June 25, 1956  Transition of Care [facility]) CM/SW Contact  Joaquin Courts, RN Phone Number: 11/21/2020, 12:47 PM  Clinical Narrative:    CM reached out to all facilities that have not responded in [place] re bed availability for patient.  At this time patient doe snot have any bed offers.  CM contacted: [facility]- will review patient [facility]- VM left [facility]- VM left [facility]- VM left [facility]- VM left [facility]- VM left [facility]_ currently not taking admissions due to covid, but may start admission by                                end of week, CM requested patient be reviewed. [facility]- currently not taking admissions due to Covid, but may start admissions by end                           Of week, CM requested patient be reviewed. [facility]- VM left   Expected Discharge Plan: Skilled Nursing Facility Barriers to Discharge: Continued Medical Work up,Insurance Authorization  Expected Discharge Plan and Services Expected Discharge Plan: [place]         Expected Discharge Date:  (unknown)                                     Social Determinants of Health (SDOH) Interventions    Readmission Risk Interventions Readmission Risk Prevention Plan 10/04/2020  Transportation Screening Complete  PCP or Specialist Appt within 5-7 Days Complete  Home Care Screening Complete  Medication Review (RN CM) Complete

## 2020-11-21 NOTE — TOC Progression Note (Signed)
Transition of Care [facility]) - Progression Note    Patient Details  Name: Jamie Wilson MRN: [id number] Date of Birth: October 27, 1956  Transition of Care [facility]) CM/SW Contact  Joaquin Courts, RN Phone Number: 11/21/2020, 2:35 PM  Clinical Narrative:    Per [facility] rep [place], facility will have a bed available on Thursday and be able to accept patient at that time.  Insurance has a Chartered loss adjuster in place at this time.  Facility made aware patient is not vaccinated.  TOC will continue to follow. Patient will need an updated Covid test prior to dc.    Expected Discharge Plan: Skilled Nursing Facility Barriers to Discharge: Continued Medical Work up,Insurance Authorization  Expected Discharge Plan and Services Expected Discharge Plan: [place]         Expected Discharge Date:  (unknown)                                     Social Determinants of Health (SDOH) Interventions    Readmission Risk Interventions Readmission Risk Prevention Plan 10/04/2020  Transportation Screening Complete  PCP or Specialist Appt within 5-7 Days Complete  Home Care Screening Complete  Medication Review (RN CM) Complete

## 2020-11-21 NOTE — Progress Notes (Signed)
Patient is complaining of pressure and pain on bladder. There are no kinks in catheter tubing and 400 mls of urine was emptied by  Tech. PRN Oxycodone 7.5mg  PO was given @ 2006 with no relief. Kyere on call was paged.

## 2020-11-21 NOTE — Progress Notes (Signed)
PROGRESS NOTE  Jamie Wilson LGX:[id number] DOB: 1956-04-17   PCP: Patient, [name]  Patient is from: Home  DOA: 11/02/2020 LOS: 45  Chief complaints: Abdominal pain  Brief Narrative / Interim history: 65 y.o.malewith medical history significant ofbladder CA w/ chronic foley placement; bowel obstruction s/p partial bowel resection w/ colostomy. Patient presented secondary to abdominal pain and found to have evidence of sepsis possibly secondary to necrotic bladder mass. Empiric antibiotics initiated. Urology and oncology consulted aand recommendations given. Hospital course complicated by hypercalcemia,.. Bone scan does not show any lytic or blastic bone lesions suspicious for bone mets. AKI resolved.  Still with some degree of hypercalcemia.  He also had some GI symptoms including nausea, emesis and abdominal distention.  KUB on 1/19 raise concern for SBO but GI symptoms resolved the next day.  Advanced diet to soft.  Therapy recommended SNF when he is medically stable.  Subjective: Seen and examined earlier this morning.  No major events overnight of this morning.  Complains about the food service.  Also reports increased ostomy output and passing a lot of gas.  Abdominal pain improved.  Denies nausea or vomiting.  Objective: Vitals:   11/20/20 1347 11/20/20 2103 11/21/20 0510 11/21/20 1400  BP: 111/70 108/66 113/70 127/69  Pulse: 97 (!) 104 99 100  Resp: 16 16 18 18   Temp: 97.8 F (36.6 C) 98.1 F (36.7 C) 97.8 F (36.6 C) 98.3 F (36.8 C)  TempSrc: Oral Oral Oral Oral  SpO2: 96% 96% 97% 96%  Weight:      Height:        Intake/Output Summary (Last 24 hours) at 11/21/2020 1528 Last data filed at 11/21/2020 1400 Gross per 24 hour  Intake 3008.42 ml  Output 3550 ml  Net -541.58 ml   Filed Weights   11/03/20 0852  Weight: 50.7 kg    Examination:  GENERAL: No apparent distress.  Nontoxic. HEENT: MMM.  Vision and hearing grossly intact.  NECK: Supple.  No apparent  JVD.  RESP: On RA.  No IWOB.  Fair aeration bilaterally. CVS:  RRR. Heart sounds normal.  ABD/GI/GU: BS+. Abd soft.  Ostomy bag with normal stool.  Foley in place. MSK/EXT:  Moves extremities. No apparent deformity. No edema.  SKIN: no apparent skin lesion or wound NEURO: Awake, alert and oriented appropriately.  No apparent focal neuro deficit. PSYCH: Calm. Normal affect.  Procedures:  None  Microbiology summarized: 1/6-influenza and COVID-19 PCR nonreactive. 1/6-urine culture with multiple species 1/6-blood culture NGTD. 1/7-urine cultures with multiple species.  Assessment & Plan: Debility/physical deconditioning/increased risk of fall-noncompliance.  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 -Per oncology,  poor candidate for neoadjuvant chemo, and recommended considering cystectomy prior to adjuvant chemo or immunotherapy versus radiation.  Hypercalcemia of malignancy: Calcium > 15 on admit>>> 11.2.  PTH within normal. -Received zoledronic acid.  -Continue IV NS.   Nausea/vomiting/abdominal distention/colostomy status-resolved except for some abdominal pain.  Seems to have a lot of ostomy output.  May not able to maintain adequate hydration by p.o. -Continue IV fluid -Continue soft diet -Change MiraLAX to as needed  Dysphagia?  Reports difficulty swallowing pills-evaluated and cleared by SLP.  Sepsis probably from urinary source: POA. CT of the abdomen and pelvis significant for necrotic bladder mass.  Urine culture with multiple species x2.  Blood cultures NGTD.  Completed 2 weeks of ciprofloxacin on 11/17/2020 per urology recommendation.  AKI: Baseline Cr 0.8.  Resolved. -IV fluid as above -Avoid nephrotoxic meds.  Leukocytosis: Related to malignancy? -Completed antibiotic course as above. -Continue  monitoring  Hypokalemia/hypomagnesemia/hypophosphatemia: Resolved except for mild hypophosphatemia -Monitor and replenish as appropriate  Anemia of chronic disease: Hgb relatively stable.  Anemia panel consistent with ACD Recent Labs    11/04/20 0740 11/05/20 0925 11/06/20 0500 11/09/20 1207 11/13/20 0512 11/16/20 0524 11/17/20 0549 11/18/20 0522 11/19/20 0102 11/20/20 0537  HGB 10.6* 11.0* 10.5* 11.3* 9.1* 10.3* 9.1* 8.8* 9.8* 9.8*  -Continue monitoring  Tobacco use disorder: -Nicotine patch  Severe protein calorie malnutrition Body mass index is 14.74 kg/m. Nutrition Problem: Severe Malnutrition Etiology: chronic illness,cancer and cancer related treatments Signs/Symptoms: severe fat depletion,severe muscle depletion,percent weight loss Interventions: Ensure Enlive (each supplement provides 350kcal and 20 grams of protein),MVI,Prostat    Wt Readings from Last 10 Encounters:  11/03/20 50.7 kg  10/12/20 58.1 kg  08/28/20 61.1 kg  07/20/20 68 kg  06/26/20 68 kg  04/03/20 72.6 kg  10/17/19 72.6 kg   DVT prophylaxis:  SCDs Start: 11/03/20 2329  Code Status: Full code Family Communication: Patient and/or RN. Available if any question.  Status is: Inpatient  Remains inpatient appropriate because:Unsafe d/c plan, IV treatments appropriate due to intensity of illness or inability to take PO and Inpatient level of care appropriate due to severity of illness   Dispo: The patient is from: Home              Anticipated d/c is to: SNF              Anticipated d/c date is: 1 day pending insurance authorization              Patient currently is medically stable to d/c.       Consultants:  Urology Oncology   Sch Meds:  Scheduled Meds: . (feeding supplement) PROSource Plus  30 mL Oral BID WC  . B-complex with vitamin C  1 tablet Oral Daily  . Chlorhexidine Gluconate Cloth  6 each Topical Daily  . feeding supplement  237 mL Oral TID BM  . folic acid  1 mg Oral  Daily  . hydrocerin   Topical BID  . nicotine  14 mg Transdermal Daily  . cyanocobalamin  1,000 mcg Oral Daily   Continuous Infusions: . sodium chloride    . sodium chloride 100 mL/hr at 11/21/20 1300   PRN Meds:.acetaminophen (TYLENOL) oral liquid 160 mg/5 mL, alum & mag hydroxide-simeth, hydrOXYzine, ondansetron **OR** ondansetron (ZOFRAN) IV, oxyCODONE, polyethylene glycol, sodium chloride flush, sodium chloride flush  Antimicrobials: Anti-infectives (From admission, onward)   Start     Dose/Rate Route Frequency Ordered Stop   11/12/20 2000  ciprofloxacin (CIPRO) tablet 500 mg        500 mg Oral 2 times daily 11/12/20 1543 11/17/20 2359   11/10/20 0800  ciprofloxacin (CIPRO) tablet 500 mg  Status:  Discontinued        500 mg Oral Daily with breakfast 11/09/20 1357 11/12/20 1543   11/06/20 1200  ciprofloxacin (CIPRO) tablet 500 mg  Status:  Discontinued        500 mg Oral 2 times daily 11/06/20 1114 11/09/20 1357   11/05/20 1500  cefdinir (OMNICEF) capsule 300 mg  Status:  Discontinued        300 mg Oral Every 12 hours 11/05/20 1337 11/06/20 1114   11/03/20 2200  vancomycin (VANCOREADY) IVPB 500 mg/100 mL  Status:  Discontinued  500 mg 100 mL/hr over 60 Minutes Intravenous Every 12 hours 11/03/20 1402 11/05/20 1335   11/03/20 1415  piperacillin-tazobactam (ZOSYN) IVPB 3.375 g  Status:  Discontinued        3.375 g 12.5 mL/hr over 240 Minutes Intravenous Every 8 hours 11/03/20 1401 11/05/20 1337   11/03/20 0645  vancomycin (VANCOCIN) IVPB 1000 mg/200 mL premix        1,000 mg 200 mL/hr over 60 Minutes Intravenous  Once 11/03/20 0631 11/03/20 1147   11/03/20 0600  piperacillin-tazobactam (ZOSYN) IVPB 4.5 g  Status:  Discontinued        4.5 g 200 mL/hr over 30 Minutes Intravenous  Once 11/03/20 0546 11/03/20 0547   11/03/20 0600  piperacillin-tazobactam (ZOSYN) IVPB 3.375 g        3.375 g 100 mL/hr over 30 Minutes Intravenous To Emergency Dept 11/03/20 0548 11/03/20 0650        I have personally reviewed the following labs and images: CBC: Recent Labs  Lab 11/16/20 0524 11/17/20 0549 11/18/20 0522 11/19/20 0102 11/20/20 0537  WBC 16.9* 16.0* 14.9* 17.4* 19.3*  NEUTROABS  --  11.0*  --   --   --   HGB 10.3* 9.1* 8.8* 9.8* 9.8*  HCT 33.8* 29.8* 29.2* 31.6* 32.3*  MCV 90.6 91.1 91.0 90.5 91.5  PLT 391 434* 421* 491* 530*   BMP &GFR Recent Labs  Lab 11/16/20 0524 11/17/20 0549 11/18/20 0522 11/19/20 0102 11/20/20 0537  NA 139 139 137 136 136  K 3.8 3.9 3.6 3.8 3.7  CL 109 114* 112* 109 111  CO2 21* 20* 19* 20* 20*  GLUCOSE 108* 93 101* 88 95  BUN 19 19 12 10 10   CREATININE 0.99 1.09 0.88 0.81 0.84  CALCIUM 11.5* 11.0* 10.6* 10.5* 11.2*  MG  --  1.8 1.7 1.6* 2.0  PHOS  --  2.0* 1.5* 2.3* 2.0*   Estimated Creatinine Clearance: 62.9 mL/min (by C-G formula based on SCr of 0.84 mg/dL). Liver & Pancreas: Recent Labs  Lab 11/17/20 0549 11/18/20 0522 11/19/20 0102 11/20/20 0537  AST 18  --   --   --   ALT 16  --   --   --   ALKPHOS 86  --   --   --   BILITOT 0.3  --   --   --   PROT 6.4*  --   --   --   ALBUMIN 2.2* 2.1* 2.2* 2.2*   Recent Labs  Lab 11/17/20 0549  LIPASE 35   No results for input(s): AMMONIA in the last 168 hours. Diabetic: No results for input(s): HGBA1C in the last 72 hours. Recent Labs  Lab 11/16/20 0514 11/17/20 0628 11/18/20 0407 11/20/20 0514 11/21/20 0512  GLUCAP 102* 82 100* 84 92   Cardiac Enzymes: No results for input(s): CKTOTAL, CKMB, CKMBINDEX, TROPONINI in the last 168 hours. No results for input(s): PROBNP in the last 8760 hours. Coagulation Profile: No results for input(s): INR, PROTIME in the last 168 hours. Thyroid Function Tests: No results for input(s): TSH, T4TOTAL, FREET4, T3FREE, THYROIDAB in the last 72 hours. Lipid Profile: No results for input(s): CHOL, HDL, LDLCALC, TRIG, CHOLHDL, LDLDIRECT in the last 72 hours. Anemia Panel: No results for input(s): VITAMINB12, FOLATE,  FERRITIN, TIBC, IRON, RETICCTPCT in the last 72 hours. Urine analysis:    Component Value Date/Time   COLORURINE YELLOW 11/03/2020 0330   APPEARANCEUR TURBID (A) 11/03/2020 0330   LABSPEC 1.019 11/03/2020 0330   PHURINE 7.0 11/03/2020  0330   GLUCOSEU NEGATIVE 11/03/2020 0330   HGBUR NEGATIVE 11/03/2020 0330   BILIRUBINUR NEGATIVE 11/03/2020 0330   KETONESUR 5 (A) 11/03/2020 0330   PROTEINUR >=300 (A) 11/03/2020 0330   NITRITE NEGATIVE 11/03/2020 0330   LEUKOCYTESUR LARGE (A) 11/03/2020 0330   Sepsis Labs: Invalid input(s): PROCALCITONIN, [place]  Microbiology: No results found for this or any previous visit (from the past 240 hour(s)).  Radiology Studies: No results found.    Ruchi Stoney T. [place]  If 7PM-7AM, please contact night-coverage [email or web address] 11/21/2020, 3:28 PM

## 2020-11-21 NOTE — Progress Notes (Signed)
Patient's bladder is distended, catheter was irrigated, 61mls was flushed but was only able to remove 10 mls. Lennox Grumbles was notified

## 2020-11-22 ENCOUNTER — Inpatient Hospital Stay (HOSPITAL_COMMUNITY): Payer: Medicaid Other

## 2020-11-22 DIAGNOSIS — A419 Sepsis, unspecified organism: Secondary | ICD-10-CM | POA: Diagnosis not present

## 2020-11-22 DIAGNOSIS — N390 Urinary tract infection, site not specified: Secondary | ICD-10-CM | POA: Diagnosis not present

## 2020-11-22 DIAGNOSIS — E43 Unspecified severe protein-calorie malnutrition: Secondary | ICD-10-CM | POA: Diagnosis not present

## 2020-11-22 DIAGNOSIS — C689 Malignant neoplasm of urinary organ, unspecified: Secondary | ICD-10-CM | POA: Diagnosis not present

## 2020-11-22 LAB — URINALYSIS, ROUTINE W REFLEX MICROSCOPIC
Bilirubin Urine: NEGATIVE
Glucose, UA: NEGATIVE mg/dL
Ketones, ur: NEGATIVE mg/dL
Nitrite: NEGATIVE
Protein, ur: 100 mg/dL — AB
RBC / HPF: >50 RBC/hpf — ABNORMAL HIGH (ref 0–5)
Specific Gravity, Urine: 1.009 (ref 1.005–1.030)
WBC, UA: >50 WBC/hpf — ABNORMAL HIGH (ref 0–5)
pH: 7 (ref 5.0–8.0)

## 2020-11-22 LAB — RENAL FUNCTION PANEL
Albumin: 2.1 g/dL — ABNORMAL LOW (ref 3.5–5.0)
Anion gap: 8 (ref 5–15)
BUN: 15 mg/dL (ref 8–23)
CO2: 17 mmol/L — ABNORMAL LOW (ref 22–32)
Calcium: 10.6 mg/dL — ABNORMAL HIGH (ref 8.9–10.3)
Chloride: 114 mmol/L — ABNORMAL HIGH (ref 98–111)
Creatinine, Ser: 0.85 mg/dL (ref 0.61–1.24)
GFR, Estimated: >60 mL/min (ref >60)
Glucose, Bld: 122 mg/dL — ABNORMAL HIGH (ref 70–99)
Phosphorus: 1.2 mg/dL — ABNORMAL LOW (ref 2.5–4.6)
Potassium: 3 mmol/L — ABNORMAL LOW (ref 3.5–5.1)
Sodium: 139 mmol/L (ref 135–145)

## 2020-11-22 LAB — CBC
HCT: 30.8 % — ABNORMAL LOW (ref 39.0–52.0)
Hemoglobin: 9.5 g/dL — ABNORMAL LOW (ref 13.0–17.0)
MCH: 28.1 pg (ref 26.0–34.0)
MCHC: 30.8 g/dL (ref 30.0–36.0)
MCV: 91.1 fL (ref 80.0–100.0)
Platelets: 524 10*3/uL — ABNORMAL HIGH (ref 150–400)
RBC: 3.38 MIL/uL — ABNORMAL LOW (ref 4.22–5.81)
RDW: 15.8 % — ABNORMAL HIGH (ref 11.5–15.5)
WBC: 25.2 10*3/uL — ABNORMAL HIGH (ref 4.0–10.5)
nRBC: 0 % (ref 0.0–0.2)

## 2020-11-22 LAB — MAGNESIUM: Magnesium: 1.7 mg/dL (ref 1.7–2.4)

## 2020-11-22 LAB — LACTIC ACID, PLASMA: Lactic Acid, Venous: 1.3 mmol/L (ref 0.5–1.9)

## 2020-11-22 LAB — GLUCOSE, CAPILLARY: Glucose-Capillary: 98 mg/dL (ref 70–99)

## 2020-11-22 MED ORDER — FENTANYL CITRATE (PF) 100 MCG/2ML IJ SOLN
25.0000 ug | Freq: Once | INTRAMUSCULAR | Status: DC
  Filled 2020-11-22: qty 2

## 2020-11-22 MED ORDER — POTASSIUM PHOSPHATES 15 MMOLE/5ML IV SOLN
30.0000 mmol | Freq: Once | INTRAVENOUS | Status: AC
  Administered 2020-11-22: 09:00:00 30 mmol via INTRAVENOUS
  Filled 2020-11-22: qty 10

## 2020-11-22 MED ORDER — POTASSIUM CHLORIDE 20 MEQ PO PACK
40.0000 meq | PACK | ORAL | Status: AC
  Administered 2020-11-22 (×2): 40 meq via ORAL
  Filled 2020-11-22 (×2): qty 2

## 2020-11-22 MED ORDER — SODIUM CHLORIDE 0.9 % IV BOLUS
1000.0000 mL | Freq: Once | INTRAVENOUS | Status: AC
  Administered 2020-11-22: 1000 mL via INTRAVENOUS

## 2020-11-22 MED ORDER — SENNOSIDES-DOCUSATE SODIUM 8.6-50 MG PO TABS
1.0000 | ORAL_TABLET | Freq: Two times a day (BID) | ORAL | Status: DC
  Administered 2020-11-23 – 2020-11-29 (×7): 1 via ORAL
  Filled 2020-11-22 (×16): qty 1

## 2020-11-22 NOTE — Progress Notes (Signed)
Verbal order by NP Kyere to irrigate foley with 30 mls at 1245a, only 1mls removed. NP Kyere at bedside, patient was bladder scanned, showed >668 mls of urine in bladder. Foley was irrigated with another 60cc of sterile water and I was only able to remove 5 mls. Patient complaining of pain and abdomen is distended.

## 2020-11-22 NOTE — Progress Notes (Signed)
Patient ID: Jamie Wilson, male   DOB: 04/29/1956, 65 y.o.   MRN: [id number]  Called regarding an obstructed foley last night.  The foley was replaced by the hospitalist team and Mr. Mallozzi got relief.  He is comfortable this AM.  BP (!) 107/58 (BP Location: Right Arm)   Pulse 85   Temp 98.5 F (36.9 C) (Oral)   Resp 18   Ht 6\' 1"  (1.854 m)   Wt 50.7 kg   SpO2 99%   BMI 14.74 kg/m   The foley is draining well and the urine is clear.   Urinary retention with bladder arreflexia and a large necrotic high grade bladder cancer with recurrent debris obstruction of the catheter.   He is not a surgical candidate for cystectomy.  Continue foley drainage with irrigation and cath changes prn.

## 2020-11-22 NOTE — TOC Progression Note (Signed)
Transition of Care [facility]) - Progression Note    Patient Details  Name: Jamie Wilson MRN: [id number] Date of Birth: Oct 08, 1956  Transition of Care [facility]) CM/SW [place], LCSW Phone Number: 11/22/2020, 5:49 PM  Clinical Narrative:    CSW met with patient bedside per patient request.  Patient notified CSW that his friend had found a place in [place] for him to d/c to. Patient gave CSW permission to talk with his friend Herold Harms. Per Randall Hiss, he does not have a place for the patient he understood the patient would discharge to [facility] SNF. Patient report understanding this now.  Patient agreeable to transfer when medically stable.    Expected Discharge Plan: Skilled Nursing Facility Barriers to Discharge: Continued Medical Work up,Insurance Authorization  Expected Discharge Plan and Services Expected Discharge Plan: [place]         Expected Discharge Date:  (unknown)                                     Social Determinants of Health (SDOH) Interventions    Readmission Risk Interventions Readmission Risk Prevention Plan 10/04/2020  Transportation Screening Complete  PCP or Specialist Appt within 5-7 Days Complete  Home Care Screening Complete  Medication Review (RN CM) Complete

## 2020-11-22 NOTE — Progress Notes (Signed)
PT Cancellation Note  Patient Details Name: Jamie Wilson MRN: [id number] DOB: January 06, 1956   Cancelled Treatment:    Reason Eval/Treat Not Completed: Patient declined, no reason specified.  Claretha Cooper 11/22/2020, 5:32 PM  [place] Pager [phone] Office [phone]

## 2020-11-22 NOTE — Progress Notes (Signed)
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 Patient presented secondary to abdominal pain and found to have evidence of sepsis possibly secondary to necrotic bladder mass. Empiric antibiotics initiated. Urology and oncology consulted aand recommendations given. Hospital course complicated by hypercalcemia,.. Bone scan does not show any lytic or blastic bone lesions suspicious for bone mets. AKI resolved.  Still with some degree of hypercalcemia.  He also had some GI symptoms including nausea, emesis and abdominal distention.  KUB on 1/19 raise concern for SBO but GI symptoms resolved the next day.  Advanced diet to soft.  Likely discharge to SNF on 11/24/2020  Subjective: Seen and examined earlier this morning.  He had abdominal distention and discomfort due to urinary traction as a result of Foley obstruction.  Foley exchanged.  About 600 cc cloudy urine output.  Discomfort relieved.  Neurology consulted and no additional input.  Feels better but afraid to go to SNF.  Objective: Vitals:   11/21/20 0510 11/21/20 1400 11/21/20 2039 11/22/20 0615  BP: 113/70 127/69 140/86 (!) 107/58  Pulse: 99 100 (!) 101 85  Resp: 18 18 18 18   Temp: 97.8 F (36.6 C) 98.3 F (36.8 C) 97.8 F (36.6 C) 98.5 F (36.9 C)  TempSrc: Oral Oral Oral Oral  SpO2: 97% 96% 95% 99%  Weight:      Height:        Intake/Output Summary (Last 24 hours) at 11/22/2020 1345 Last data filed at 11/22/2020 1015 Gross per 24 hour  Intake 3433.56 ml  Output 3165 ml  Net 268.56 ml   Filed Weights   11/03/20 0852  Weight: 50.7 kg    Examination:  GENERAL: Frail looking elderly male.  No apparent distress. HEENT:  MMM.  Vision and hearing grossly intact.  NECK: Supple.  No apparent JVD.  RESP: On RA.  No IWOB.  Fair aeration bilaterally. CVS:  RRR. Heart sounds normal.  ABD/GI/GU: BS+. Abd soft, NTND.  Ostomy bag with soft stool.  Foley in place.  Clear urine. MSK/EXT:  Moves extremities.  Significant muscle mass and subcu fat loss. SKIN: no apparent skin lesion or wound NEURO: Awake, alert and oriented appropriately.  No apparent focal neuro deficit. PSYCH: Calm. Normal affect.   Procedures:  None  Microbiology summarized: 1/6-influenza and COVID-19 PCR nonreactive. 1/6-urine culture with multiple species 1/6-blood culture NGTD. 1/7-urine cultures with multiple species.  Assessment & Plan: Debility/physical deconditioning/increased risk of fall-noncompliance.  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 -Per oncology,  poor candidate for neoadjuvant chemo, and recommended considering cystectomy prior to adjuvant chemo or immunotherapy versus radiation.  Hypercalcemia of malignancy: Calcium > 15 on admit>>> 10.6.  PTH within normal. -Received zoledronic acid.  -Continue IV NS.   Nausea/vomiting/abdominal distention/colostomy status-resolved except for some abdominal pain.  -Continue IV fluid and soft diet -Changed MiraLAX to as needed due to high ostomy output  Dysphagia?  Reports difficulty swallowing pills-evaluated and cleared by SLP.  Sepsis probably from urinary source: POA. CT of the abdomen and pelvis significant for necrotic bladder mass.  Urine culture with multiple species x2.  Blood cultures NGTD.  Completed 2 weeks of ciprofloxacin on 11/17/2020 per urology recommendation.  Still with leukocytosis likely due to malignancy. -Need prophylactic antibiotics?  AKI: Baseline Cr 0.8.   Resolved. -IV fluid as above -Avoid nephrotoxic meds.  Leukocytosis: Related to malignancy?  Fluctuating. -Completed antibiotic course as above. -Continue monitoring  Hypokalemia/hypomagnesemia/hypophosphatemia: Resolved except for mild hypophosphatemia -Replenish and recheck.  Anemia of chronic disease: Hgb relatively stable.  Anemia panel consistent with ACD Recent Labs    11/05/20 0925 11/06/20 0500 11/09/20 1207 11/13/20 0512 11/16/20 0524 11/17/20 0549 11/18/20 0522 11/19/20 0102 11/20/20 0537 11/22/20 0516  HGB 11.0* 10.5* 11.3* 9.1* 10.3* 9.1* 8.8* 9.8* 9.8* 9.5*  -Continue monitoring  Tobacco use disorder: -Nicotine patch  Severe protein calorie malnutrition Body mass index is 14.74 kg/m. Nutrition Problem: Severe Malnutrition Etiology: chronic illness,cancer and cancer related treatments Signs/Symptoms: severe fat depletion,severe muscle depletion,percent weight loss Interventions: Ensure Enlive (each supplement provides 350kcal and 20 grams of protein),MVI,Prostat    Wt Readings from Last 10 Encounters:  11/03/20 50.7 kg  10/12/20 58.1 kg  08/28/20 61.1 kg  07/20/20 68 kg  06/26/20 68 kg  04/03/20 72.6 kg  10/17/19 72.6 kg   DVT prophylaxis:  SCDs Start: 11/03/20 2329  Code Status: Full code Family Communication: Patient and/or RN. Available if any question.  Status is: Inpatient  Remains inpatient appropriate because:Persistent severe electrolyte disturbances, Unsafe d/c plan, IV treatments appropriate due to intensity of illness or inability to take PO and Inpatient level of care appropriate due to severity of illness   Dispo: The patient is from: Home              Anticipated d/c is to: SNF              Anticipated d/c date is: 2 days               Patient currently is medically stable to d/c.       Consultants:  Urology Oncology   Sch Meds:  Scheduled Meds: . (feeding supplement) PROSource Plus  30 mL Oral BID WC  . B-complex  with vitamin C  1 tablet Oral Daily  . Chlorhexidine Gluconate Cloth  6 each Topical Daily  . feeding supplement  237 mL Oral TID BM  . folic acid  1 mg Oral Daily  . hydrocerin   Topical BID  . nicotine  14 mg Transdermal Daily  . potassium chloride  40 mEq Oral Q4H  . senna-docusate  1 tablet Oral BID  . tamsulosin  0.4 mg Oral Nightly  . cyanocobalamin  1,000 mcg Oral Daily   Continuous Infusions: . sodium chloride    . sodium chloride 75 mL/hr at 11/22/20 0836  . potassium PHOSPHATE IVPB (in mmol) 30 mmol (11/22/20 0858)   PRN Meds:.acetaminophen (TYLENOL) oral liquid 160 mg/5 mL, alum & mag hydroxide-simeth, hydrOXYzine, ondansetron **OR** ondansetron (ZOFRAN) IV, oxyCODONE, polyethylene glycol, sodium chloride flush, sodium chloride flush  Antimicrobials: Anti-infectives (From admission, onward)   Start     Dose/Rate Route Frequency Ordered Stop   11/12/20 2000  ciprofloxacin (CIPRO) tablet 500 mg        500 mg Oral 2 times daily 11/12/20 1543 11/17/20 2359   11/10/20 0800  ciprofloxacin (CIPRO) tablet 500 mg  Status:  Discontinued        500 mg Oral Daily with breakfast 11/09/20 1357 11/12/20 1543   11/06/20 1200  ciprofloxacin (CIPRO) tablet 500 mg  Status:  Discontinued        500  mg Oral 2 times daily 11/06/20 1114 11/09/20 1357   11/05/20 1500  cefdinir (OMNICEF) capsule 300 mg  Status:  Discontinued        300 mg Oral Every 12 hours 11/05/20 1337 11/06/20 1114   11/03/20 2200  vancomycin (VANCOREADY) IVPB 500 mg/100 mL  Status:  Discontinued        500 mg 100 mL/hr over 60 Minutes Intravenous Every 12 hours 11/03/20 1402 11/05/20 1335   11/03/20 1415  piperacillin-tazobactam (ZOSYN) IVPB 3.375 g  Status:  Discontinued        3.375 g 12.5 mL/hr over 240 Minutes Intravenous Every 8 hours 11/03/20 1401 11/05/20 1337   11/03/20 0645  vancomycin (VANCOCIN) IVPB 1000 mg/200 mL premix        1,000 mg 200 mL/hr over 60 Minutes Intravenous  Once 11/03/20 0631 11/03/20 1147    11/03/20 0600  piperacillin-tazobactam (ZOSYN) IVPB 4.5 g  Status:  Discontinued        4.5 g 200 mL/hr over 30 Minutes Intravenous  Once 11/03/20 0546 11/03/20 0547   11/03/20 0600  piperacillin-tazobactam (ZOSYN) IVPB 3.375 g        3.375 g 100 mL/hr over 30 Minutes Intravenous To Emergency Dept 11/03/20 0548 11/03/20 0650       I have personally reviewed the following labs and images: CBC: Recent Labs  Lab 11/17/20 0549 11/18/20 0522 11/19/20 0102 11/20/20 0537 11/22/20 0516  WBC 16.0* 14.9* 17.4* 19.3* 25.2*  NEUTROABS 11.0*  --   --   --   --   HGB 9.1* 8.8* 9.8* 9.8* 9.5*  HCT 29.8* 29.2* 31.6* 32.3* 30.8*  MCV 91.1 91.0 90.5 91.5 91.1  PLT 434* 421* 491* 530* 524*   BMP &GFR Recent Labs  Lab 11/17/20 0549 11/18/20 0522 11/19/20 0102 11/20/20 0537 11/22/20 0516  NA 139 137 136 136 139  K 3.9 3.6 3.8 3.7 3.0*  CL 114* 112* 109 111 114*  CO2 20* 19* 20* 20* 17*  GLUCOSE 93 101* 88 95 122*  BUN 19 12 10 10 15   CREATININE 1.09 0.88 0.81 0.84 0.85  CALCIUM 11.0* 10.6* 10.5* 11.2* 10.6*  MG 1.8 1.7 1.6* 2.0 1.7  PHOS 2.0* 1.5* 2.3* 2.0* 1.2*   Estimated Creatinine Clearance: 62.1 mL/min (by C-G formula based on SCr of 0.85 mg/dL). Liver & Pancreas: Recent Labs  Lab 11/17/20 0549 11/18/20 0522 11/19/20 0102 11/20/20 0537 11/22/20 0516  AST 18  --   --   --   --   ALT 16  --   --   --   --   ALKPHOS 86  --   --   --   --   BILITOT 0.3  --   --   --   --   PROT 6.4*  --   --   --   --   ALBUMIN 2.2* 2.1* 2.2* 2.2* 2.1*   Recent Labs  Lab 11/17/20 0549  LIPASE 35   No results for input(s): AMMONIA in the last 168 hours. Diabetic: No results for input(s): HGBA1C in the last 72 hours. Recent Labs  Lab 11/17/20 0628 11/18/20 0407 11/20/20 0514 11/21/20 0512 11/22/20 0707  GLUCAP 82 100* 84 92 98   Cardiac Enzymes: No results for input(s): CKTOTAL, CKMB, CKMBINDEX, TROPONINI in the last 168 hours. No results for input(s): PROBNP in the last 8760  hours. Coagulation Profile: No results for input(s): INR, PROTIME in the last 168 hours. Thyroid Function Tests: No results for input(s):  TSH, T4TOTAL, FREET4, T3FREE, THYROIDAB in the last 72 hours. Lipid Profile: No results for input(s): CHOL, HDL, LDLCALC, TRIG, CHOLHDL, LDLDIRECT in the last 72 hours. Anemia Panel: No results for input(s): VITAMINB12, FOLATE, FERRITIN, TIBC, IRON, RETICCTPCT in the last 72 hours. Urine analysis:    Component Value Date/Time   COLORURINE YELLOW 11/22/2020 0204   APPEARANCEUR CLOUDY (A) 11/22/2020 0204   LABSPEC 1.009 11/22/2020 0204   PHURINE 7.0 11/22/2020 0204   GLUCOSEU NEGATIVE 11/22/2020 0204   HGBUR LARGE (A) 11/22/2020 0204   BILIRUBINUR NEGATIVE 11/22/2020 0204   KETONESUR NEGATIVE 11/22/2020 0204   PROTEINUR 100 (A) 11/22/2020 0204   NITRITE NEGATIVE 11/22/2020 0204   LEUKOCYTESUR MODERATE (A) 11/22/2020 0204   Sepsis Labs: Invalid input(s): PROCALCITONIN, [place]  Microbiology: No results found for this or any previous visit (from the past 240 hour(s)).  Radiology Studies: DG Abd Portable 1V  Result Date: 11/22/2020 CLINICAL DATA:  Urinary retention EXAM: PORTABLE ABDOMEN - 1 VIEW COMPARISON:  11/16/2020 FINDINGS: Previously noted dilated gas-filled loops of bowel appear improved in caliber compatible with a resolving ileus or small bowel obstruction. No gross free intraperitoneal gas. Soft tissue opacity displaces bowel from the pelvis likely representing the known bladder mass. Ventral or inguinal hernia repair with mesh has been performed. IMPRESSION: Improving small-bowel obstruction or ileus. Electronically Signed   By: Fidela Salisbury MD   On: 11/22/2020 01:59      Abdullah Rizzi T. [place]  If 7PM-7AM, please contact night-coverage [email or web address] 11/22/2020, 1:45 PM

## 2020-11-22 NOTE — Plan of Care (Addendum)
Pt c/o extreme bladder pain and discomfort. Foley flushed with 13ml saline with return of 66ml. Bladder scan showed 655ml.  Pt's UOP this shift from 7pm to now 528ml per RN and tech.  Took foley (#22) out and placed another #22 with immediate return of 600 ml of cloudy urine and resolution of his bladder distention and discomfort.. Paged on call urology and spoke with MD and informed him of my intervention. MD will see patient in AM.

## 2020-11-22 NOTE — Progress Notes (Signed)
Patient yellow mews d/t BP and HR. AO and not in distress. Asking for pain meds currently. Up in bed and eating lunch. MD paged.

## 2020-11-23 DIAGNOSIS — A4151 Sepsis due to Escherichia coli [E. coli]: Principal | ICD-10-CM

## 2020-11-23 LAB — BLOOD CULTURE ID PANEL (REFLEXED) - BCID2

## 2020-11-23 LAB — GLUCOSE, CAPILLARY: Glucose-Capillary: 112 mg/dL — ABNORMAL HIGH (ref 70–99)

## 2020-11-23 LAB — TSH: TSH: 1.17 u[IU]/mL (ref 0.350–4.500)

## 2020-11-23 LAB — CBC
HCT: 29.4 % — ABNORMAL LOW (ref 39.0–52.0)
Hemoglobin: 9 g/dL — ABNORMAL LOW (ref 13.0–17.0)
MCH: 27.7 pg (ref 26.0–34.0)
MCHC: 30.6 g/dL (ref 30.0–36.0)
MCV: 90.5 fL (ref 80.0–100.0)
Platelets: 420 10*3/uL — ABNORMAL HIGH (ref 150–400)
RBC: 3.25 MIL/uL — ABNORMAL LOW (ref 4.22–5.81)
RDW: 16.1 % — ABNORMAL HIGH (ref 11.5–15.5)
WBC: 21.6 10*3/uL — ABNORMAL HIGH (ref 4.0–10.5)
nRBC: 0 % (ref 0.0–0.2)

## 2020-11-23 LAB — COMPREHENSIVE METABOLIC PANEL
ALT: 12 U/L (ref 0–44)
AST: 16 U/L (ref 15–41)
Albumin: 2 g/dL — ABNORMAL LOW (ref 3.5–5.0)
Alkaline Phosphatase: 94 U/L (ref 38–126)
Anion gap: 6 (ref 5–15)
BUN: 12 mg/dL (ref 8–23)
CO2: 18 mmol/L — ABNORMAL LOW (ref 22–32)
Calcium: 10.4 mg/dL — ABNORMAL HIGH (ref 8.9–10.3)
Chloride: 112 mmol/L — ABNORMAL HIGH (ref 98–111)
Creatinine, Ser: 0.86 mg/dL (ref 0.61–1.24)
GFR, Estimated: >60 mL/min (ref >60)
Glucose, Bld: 105 mg/dL — ABNORMAL HIGH (ref 70–99)
Potassium: 3.5 mmol/L (ref 3.5–5.1)
Sodium: 136 mmol/L (ref 135–145)
Total Bilirubin: 0.3 mg/dL (ref 0.3–1.2)
Total Protein: 6.3 g/dL — ABNORMAL LOW (ref 6.5–8.1)

## 2020-11-23 LAB — MAGNESIUM: Magnesium: 1.7 mg/dL (ref 1.7–2.4)

## 2020-11-23 LAB — PHOSPHORUS: Phosphorus: 2.1 mg/dL — ABNORMAL LOW (ref 2.5–4.6)

## 2020-11-23 LAB — CORTISOL-AM, BLOOD: Cortisol - AM: 22.3 ug/dL (ref 6.7–22.6)

## 2020-11-23 MED ORDER — OXYCODONE HCL 5 MG/5ML PO SOLN
5.0000 mg | ORAL | Status: DC | PRN
Start: 2020-11-23 — End: 2020-12-24
  Administered 2020-11-23 – 2020-11-28 (×24): 5 mg via ORAL
  Administered 2020-11-28: 7.5 mg via ORAL
  Administered 2020-11-28 – 2020-12-03 (×19): 5 mg via ORAL
  Administered 2020-12-03: 7.5 mg via ORAL
  Administered 2020-12-03 (×3): 5 mg via ORAL
  Administered 2020-12-04 – 2020-12-05 (×8): 7.5 mg via ORAL
  Administered 2020-12-06: 5 mg via ORAL
  Administered 2020-12-06: 05:00:00 7.5 mg via ORAL
  Administered 2020-12-06: 10:00:00 5 mg via ORAL
  Administered 2020-12-07: 7.5 mg via ORAL
  Administered 2020-12-07: 5 mg via ORAL
  Administered 2020-12-07: 7.5 mg via ORAL
  Administered 2020-12-07 – 2020-12-09 (×9): 5 mg via ORAL
  Administered 2020-12-09: 13:00:00 7.5 mg via ORAL
  Administered 2020-12-10 – 2020-12-13 (×16): 5 mg via ORAL
  Administered 2020-12-13 – 2020-12-16 (×14): 7.5 mg via ORAL
  Administered 2020-12-17 – 2020-12-18 (×7): 5 mg via ORAL
  Administered 2020-12-18: 7.5 mg via ORAL
  Administered 2020-12-19 – 2020-12-23 (×25): 5 mg via ORAL
  Filled 2020-11-23: qty 5
  Filled 2020-11-23: qty 10
  Filled 2020-11-23 (×6): qty 5
  Filled 2020-11-23 (×2): qty 10
  Filled 2020-11-23 (×2): qty 5
  Filled 2020-11-23: qty 10
  Filled 2020-11-23: qty 5
  Filled 2020-11-23: qty 10
  Filled 2020-11-23 (×9): qty 5
  Filled 2020-11-23 (×2): qty 10
  Filled 2020-11-23 (×6): qty 5
  Filled 2020-11-23: qty 10
  Filled 2020-11-23 (×2): qty 5
  Filled 2020-11-23: qty 10
  Filled 2020-11-23 (×3): qty 5
  Filled 2020-11-23: qty 10
  Filled 2020-11-23 (×4): qty 5
  Filled 2020-11-23: qty 10
  Filled 2020-11-23: qty 5
  Filled 2020-11-23: qty 10
  Filled 2020-11-23 (×3): qty 5
  Filled 2020-11-23: qty 10
  Filled 2020-11-23 (×5): qty 5
  Filled 2020-11-23: qty 10
  Filled 2020-11-23 (×2): qty 5
  Filled 2020-11-23: qty 10
  Filled 2020-11-23 (×2): qty 5
  Filled 2020-11-23: qty 10
  Filled 2020-11-23: qty 5
  Filled 2020-11-23 (×2): qty 10
  Filled 2020-11-23 (×3): qty 5
  Filled 2020-11-23: qty 10
  Filled 2020-11-23: qty 5
  Filled 2020-11-23: qty 10
  Filled 2020-11-23: qty 5
  Filled 2020-11-23: qty 10
  Filled 2020-11-23 (×3): qty 5
  Filled 2020-11-23 (×2): qty 10
  Filled 2020-11-23 (×8): qty 5
  Filled 2020-11-23: qty 10
  Filled 2020-11-23: qty 5
  Filled 2020-11-23: qty 10
  Filled 2020-11-23: qty 5
  Filled 2020-11-23: qty 10
  Filled 2020-11-23 (×18): qty 5
  Filled 2020-11-23: qty 10
  Filled 2020-11-23 (×4): qty 5
  Filled 2020-11-23: qty 10
  Filled 2020-11-23 (×15): qty 5
  Filled 2020-11-23: qty 10
  Filled 2020-11-23 (×8): qty 5

## 2020-11-23 MED ORDER — POTASSIUM PHOSPHATES 15 MMOLE/5ML IV SOLN
30.0000 mmol | Freq: Once | INTRAVENOUS | Status: AC
  Administered 2020-11-23: 15:00:00 30 mmol via INTRAVENOUS
  Filled 2020-11-23: qty 10

## 2020-11-23 MED ORDER — MAGNESIUM SULFATE 2 GM/50ML IV SOLN
2.0000 g | Freq: Once | INTRAVENOUS | Status: AC
  Administered 2020-11-23: 14:00:00 2 g via INTRAVENOUS
  Filled 2020-11-23: qty 50

## 2020-11-23 MED ORDER — SODIUM CHLORIDE 0.9 % IV SOLN
1.0000 g | Freq: Three times a day (TID) | INTRAVENOUS | Status: AC
  Administered 2020-11-23 – 2020-12-03 (×32): 1 g via INTRAVENOUS
  Filled 2020-11-23 (×4): qty 1
  Filled 2020-11-23: qty 0.33
  Filled 2020-11-23 (×20): qty 1
  Filled 2020-11-23: qty 0.33
  Filled 2020-11-23 (×6): qty 1

## 2020-11-23 MED ORDER — BOOST / RESOURCE BREEZE PO LIQD CUSTOM
1.0000 | Freq: Two times a day (BID) | ORAL | Status: DC
  Administered 2020-11-24 – 2020-11-29 (×9): 1 via ORAL

## 2020-11-23 NOTE — Progress Notes (Signed)
Patient is a yellow MEWS because his HR is 113, he was previously yellow, VS are being checked every 4 hours. Patient complaining of pain 8 out of 10 in his back, PRN oxycodone 5mg  was given.

## 2020-11-23 NOTE — Progress Notes (Signed)
PHARMACY - PHYSICIAN COMMUNICATION CRITICAL VALUE ALERT - BLOOD CULTURE IDENTIFICATION (BCID)  Jamie Wilson is an 65 y.o. male who presented to [facility] on 11/02/2020 with a chief complaint of abd pain  Assessment:  2/2 bottles ESBL E coli> suspect urinary source in pt w/ chronic foley  Name of physician (or Provider) ContactedMaylene Roes via [facility]  Current antibiotics: none  Changes to prescribed antibiotics recommended:  Add Meropenem 1 gm IV q8h   Results for orders placed or performed during the hospital encounter of 11/02/20  Blood Culture ID Panel (Reflexed) (Collected: 11/22/2020  2:59 PM)  Result Value Ref Range   Enterococcus faecalis NOT DETECTED NOT DETECTED   Enterococcus Faecium NOT DETECTED NOT DETECTED   Listeria monocytogenes NOT DETECTED NOT DETECTED   Staphylococcus species NOT DETECTED NOT DETECTED   Staphylococcus aureus (BCID) NOT DETECTED NOT DETECTED   Staphylococcus epidermidis NOT DETECTED NOT DETECTED   Staphylococcus lugdunensis NOT DETECTED NOT DETECTED   Streptococcus species NOT DETECTED NOT DETECTED   Streptococcus agalactiae NOT DETECTED NOT DETECTED   Streptococcus pneumoniae NOT DETECTED NOT DETECTED   Streptococcus pyogenes NOT DETECTED NOT DETECTED   A.calcoaceticus-baumannii NOT DETECTED NOT DETECTED   Bacteroides fragilis NOT DETECTED NOT DETECTED   Enterobacterales DETECTED (A) NOT DETECTED   Enterobacter cloacae complex NOT DETECTED NOT DETECTED   Escherichia coli DETECTED (A) NOT DETECTED   Klebsiella aerogenes NOT DETECTED NOT DETECTED   Klebsiella oxytoca NOT DETECTED NOT DETECTED   Klebsiella pneumoniae NOT DETECTED NOT DETECTED   Proteus species NOT DETECTED NOT DETECTED   Salmonella species NOT DETECTED NOT DETECTED   Serratia marcescens NOT DETECTED NOT DETECTED   Haemophilus influenzae NOT DETECTED NOT DETECTED   Neisseria meningitidis NOT DETECTED NOT DETECTED   Pseudomonas aeruginosa NOT DETECTED NOT DETECTED   Stenotrophomonas  maltophilia NOT DETECTED NOT DETECTED   Candida albicans NOT DETECTED NOT DETECTED   Candida auris NOT DETECTED NOT DETECTED   Candida glabrata NOT DETECTED NOT DETECTED   Candida krusei NOT DETECTED NOT DETECTED   Candida parapsilosis NOT DETECTED NOT DETECTED   Candida tropicalis NOT DETECTED NOT DETECTED   Cryptococcus neoformans/gattii NOT DETECTED NOT DETECTED   CTX-M ESBL DETECTED (A) NOT DETECTED   Carbapenem resistance IMP NOT DETECTED NOT DETECTED   Carbapenem resistance KPC NOT DETECTED NOT DETECTED   Carbapenem resistance NDM NOT DETECTED NOT DETECTED   Carbapenem resist OXA 48 LIKE NOT DETECTED NOT DETECTED   Carbapenem resistance VIM NOT DETECTED NOT DETECTED    Eudelia Bunch, Pharm.D 11/23/2020 9:42 AM

## 2020-11-23 NOTE — Progress Notes (Signed)
Nutrition Follow-up  DOCUMENTATION CODES:   Severe malnutrition in context of chronic illness,Underweight  INTERVENTION:   -Boost Breeze po BID with meals, each supplement provides 250 kcal and 9 grams of protein  -Prosource Plus PO BID, each provides 100 kcals and 15g protein  -Ensure Enlive poTID, each supplement provides 350 kcal and 20 grams of protein  -Needs updated weight for admission  NUTRITION DIAGNOSIS:   Severe Malnutrition related to chronic illness,cancer and cancer related treatments as evidenced by severe fat depletion,severe muscle depletion,percent weight loss.  Ongoing.  GOAL:   Patient will meet greater than or equal to 90% of their needs  Progressing.  MONITOR:   PO intake,Supplement acceptance,Labs,I & O's,Weight trends  ASSESSMENT:   65 y.o. 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 Patient presented secondary to abdominal pain and found to have evidence of sepsis possibly secondary to necrotic bladder mass.  Patient's PO intakes have been variable, yesterday consumed 0-100% of meals.   Patient has only accepted 1 Ensure and 1 Prosource in the past 48 hours. Will add Boost Breeze to provide variety in supplements. LOS now 20 days.  Admission weight: 111 lbs. No new weights for admission  I/Os: -17L since admit UOP: 4.4L x 24 hrs Colostomy: 750 ml  Medications: B complex w/ Vitamin C, Folic acid, Senokot, Vitamin B-12  Labs reviewed: CBGs: 98-112 Low Phos Mg WNL  Diet Order:   Diet Order            DIET SOFT Room service appropriate? Yes; Fluid consistency: Thin  Diet effective now                 EDUCATION NEEDS:   No education needs have been identified at this time  Skin:  Skin Assessment: Reviewed RN Assessment  Last BM:  1/19  Height:   Ht Readings from Last 1 Encounters:  11/02/20 6\' 1"  (1.854 m)    Weight:   Wt  Readings from Last 1 Encounters:  11/03/20 50.7 kg   BMI:  Body mass index is 14.74 kg/m.  Estimated Nutritional Needs:   Kcal:  2100-2300  Protein:  100-115g  Fluid:  2.1L/day  Clayton Bibles, MS, RD, LDN Inpatient Clinical Dietitian Contact information available via Amion

## 2020-11-23 NOTE — Progress Notes (Signed)
PROGRESS NOTE    Jamie Wilson  [id number] DOB: [birth year] DOA: 11/02/2020 PCP: Patient, [name]     Brief Narrative:  Jamie Wilson is a 65 y.o.malewith medical history significant ofbladder CA w/ chronic foley placement; bowel obstruction s/p partial bowel resection w/ colostomy. Patient presented secondary to abdominal pain and found to have evidence of sepsis possibly secondary to necrotic bladder mass. Empiric antibiotics initiated. Urology and oncology consulted and recommendations given. Hospital course complicated by hypercalcemia. Bone scan does not show any lytic or blastic bone lesions suspicious for bone mets.AKI resolved.  Still with some degree of hypercalcemia.  He also had some GI symptoms including nausea, emesis and abdominal distention.  KUB on 1/19 raise concern for SBO but GI symptoms resolved the next day.   New events last 24 hours / Subjective: Had a fever overnight 100.4.  Blood culture resulted with E. coli.  Patient started on Merrem.  Patient complains of "bladder pain."  States that he is not feeling well overall, having some nausea and decreased appetite.  Assessment & Plan:   Active Problems:   Malnutrition ([place])   Sepsis ([place])   Protein-calorie malnutrition, severe   Lower urinary tract infectious disease   Palliative care by specialist   Goals of care, counseling/discussion   Sepsis secondary to E. 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  Now with repeat blood culture showing E. 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 Jeffie Pollock -Follow-up with oncology in 2 weeks, Dr. Irene Limbo   Hypercalcemia of malignancy -Status post  zoledronic acid -Improving  AKI -Resolved  Severe protein calorie malnutrition -Estimated body mass index is 17.42 kg/m as calculated from the following:   Height as of this encounter: 6\' 1"  (1.854 m).   Weight as of this encounter: 59.9 kg.   DVT prophylaxis:  SCDs Start: 11/03/20 2329  Code Status: Full code Family Communication: No family at bedside Disposition Plan:  Status is: Inpatient  Remains inpatient appropriate because:Inpatient level of care appropriate due to severity of illness   Dispo: The patient is from: Home              Anticipated d/c is to: SNF              Anticipated d/c date is: 3 days              Patient currently is not medically stable to d/c.   Difficult to place patient No   Consultants:   Urology  Oncology  Palliative care medicine   Antimicrobials:  Anti-infectives (From admission, onward)   Start     Dose/Rate Route Frequency Ordered Stop   11/23/20 1000  meropenem (MERREM) 1 g in sodium chloride 0.9 % 100 mL IVPB        1 g 200 mL/hr over 30 Minutes Intravenous Every 8 hours 11/23/20 0937     11/12/20 2000  ciprofloxacin (CIPRO) tablet 500 mg        500 mg Oral 2 times daily 11/12/20 1543 11/17/20 2359   11/10/20 0800  ciprofloxacin (CIPRO) tablet 500 mg  Status:  Discontinued        500 mg Oral Daily with breakfast 11/09/20 1357 11/12/20 1543   11/06/20 1200  ciprofloxacin (CIPRO) tablet 500 mg  Status:  Discontinued        500 mg Oral 2 times daily 11/06/20 1114 11/09/20 1357   11/05/20 1500  cefdinir (OMNICEF) capsule 300 mg  Status:  Discontinued        300 mg Oral Every 12 hours 11/05/20 1337 11/06/20 1114   11/03/20 2200  vancomycin (VANCOREADY) IVPB 500 mg/100 mL  Status:  Discontinued        500 mg 100 mL/hr over 60 Minutes Intravenous Every 12 hours 11/03/20 1402 11/05/20 1335   11/03/20 1415  piperacillin-tazobactam (ZOSYN) IVPB 3.375 g  Status:  Discontinued        3.375 g 12.5 mL/hr over 240 Minutes Intravenous  Every 8 hours 11/03/20 1401 11/05/20 1337   11/03/20 0645  vancomycin (VANCOCIN) IVPB 1000 mg/200 mL premix        1,000 mg 200 mL/hr over 60 Minutes Intravenous  Once 11/03/20 0631 11/03/20 1147   11/03/20 0600  piperacillin-tazobactam (ZOSYN) IVPB 4.5 g  Status:  Discontinued        4.5 g 200 mL/hr over 30 Minutes Intravenous  Once 11/03/20 0546 11/03/20 0547   11/03/20 0600  piperacillin-tazobactam (ZOSYN) IVPB 3.375 g        3.375 g 100 mL/hr over 30 Minutes Intravenous To Emergency Dept 11/03/20 0548 11/03/20 0650        Objective: Vitals:   11/23/20 0528 11/23/20 0541 11/23/20 1017 11/23/20 1042  BP: 98/60 99/61  (!) 93/55  Pulse: (!) 114 (!) 109  (!) 112  Resp: 18   18  Temp: 98.4 F (36.9 C)   98.4 F (36.9 C)  TempSrc: Oral   Oral  SpO2: 96% 96%  96%  Weight:   59.9 kg   Height:        Intake/Output Summary (Last 24 hours) at 11/23/2020 1407 Last data filed at 11/23/2020 1046 Gross per 24 hour  Intake 3749.26 ml  Output 4450 ml  Net -700.74 ml   Filed Weights   11/03/20 0852 11/23/20 1017  Weight: 50.7 kg 59.9 kg    Examination:  General exam: Appears calm  Respiratory system: Clear to auscultation. Respiratory effort normal. No respiratory distress. No conversational dyspnea.  Cardiovascular system: S1 & S2 heard, RRR. No murmurs. No pedal edema. Gastrointestinal system: Abdomen is nondistended, soft and nontender. Normal bowel sounds heard. Central nervous system: Alert and oriented. No focal neurological deficits. Speech clear.  Extremities: Symmetric in appearance  Skin: No rashes, lesions or ulcers on exposed skin  Psychiatry: Judgement and insight appear normal. Mood & affect appropriate.   Data Reviewed: I have personally reviewed following labs and imaging studies  CBC: Recent Labs  Lab 11/17/20 0549 11/18/20 0522 11/19/20 0102 11/20/20 0537 11/22/20 0516 11/23/20 0515  WBC 16.0* 14.9* 17.4* 19.3* 25.2* 21.6*  NEUTROABS 11.0*  --   --    --   --   --   HGB 9.1* 8.8* 9.8* 9.8* 9.5* 9.0*  HCT 29.8* 29.2* 31.6* 32.3* 30.8* 29.4*  MCV 91.1 91.0 90.5 91.5 91.1 90.5  PLT 434* 421* 491* 530* 524* 546*   Basic Metabolic Panel: Recent Labs  Lab 11/18/20 0522 11/19/20 0102 11/20/20 0537 11/22/20 0516 11/23/20 0515  NA 137 136 136 139 136  K 3.6 3.8 3.7 3.0* 3.5  CL 112* 109 111 114* 112*  CO2 19* 20* 20* 17* 18*  GLUCOSE 101* 88 95 122* 105*  BUN 12 10 10 15 12   CREATININE 0.88 0.81  0.84 0.85 0.86  CALCIUM 10.6* 10.5* 11.2* 10.6* 10.4*  MG 1.7 1.6* 2.0 1.7 1.7  PHOS 1.5* 2.3* 2.0* 1.2* 2.1*   GFR: Estimated Creatinine Clearance: 72.6 mL/min (by C-G formula based on SCr of 0.86 mg/dL). Liver Function Tests: Recent Labs  Lab 11/17/20 0549 11/18/20 0522 11/19/20 0102 11/20/20 0537 11/22/20 0516 11/23/20 0515  AST 18  --   --   --   --  16  ALT 16  --   --   --   --  12  ALKPHOS 86  --   --   --   --  94  BILITOT 0.3  --   --   --   --  0.3  PROT 6.4*  --   --   --   --  6.3*  ALBUMIN 2.2* 2.1* 2.2* 2.2* 2.1* 2.0*   Recent Labs  Lab 11/17/20 0549  LIPASE 35   No results for input(s): AMMONIA in the last 168 hours. Coagulation Profile: No results for input(s): INR, PROTIME in the last 168 hours. Cardiac Enzymes: No results for input(s): CKTOTAL, CKMB, CKMBINDEX, TROPONINI in the last 168 hours. BNP (last 3 results) No results for input(s): PROBNP in the last 8760 hours. HbA1C: No results for input(s): HGBA1C in the last 72 hours. CBG: Recent Labs  Lab 11/18/20 0407 11/20/20 0514 11/21/20 0512 11/22/20 0707 11/23/20 0558  GLUCAP 100* 84 92 98 112*   Lipid Profile: No results for input(s): CHOL, HDL, LDLCALC, TRIG, CHOLHDL, LDLDIRECT in the last 72 hours. Thyroid Function Tests: Recent Labs    11/23/20 0515  TSH 1.170   Anemia Panel: No results for input(s): VITAMINB12, FOLATE, FERRITIN, TIBC, IRON, RETICCTPCT in the last 72 hours. Sepsis Labs: Recent Labs  Lab 11/22/20 1459   LATICACIDVEN 1.3    Recent Results (from the past 240 hour(s))  Culture, blood (routine x 2)     Status: None (Preliminary result)   Collection Time: 11/22/20  2:59 PM   Specimen: BLOOD  Result Value Ref Range Status   Specimen Description   Final    BLOOD LEFT ANTECUBITAL Performed at [facility] [address] [place], [place] [address]    Special Requests   Final    BOTTLES DRAWN AEROBIC ONLY Blood Culture adequate volume Performed at [facility] [address], [place], [place] [address]    Culture  Setup Time   Final    GRAM NEGATIVE RODS AEROBIC BOTTLE ONLY CRITICAL VALUE NOTED.  VALUE IS CONSISTENT WITH PREVIOUSLY REPORTED AND CALLED VALUE. Performed at [facility], [place] [address] [place], [place] [address]    Culture GRAM NEGATIVE RODS  Final   Report Status PENDING  Incomplete  Culture, blood (routine x 2)     Status: None (Preliminary result)   Collection Time: 11/22/20  2:59 PM   Specimen: BLOOD LEFT HAND  Result Value Ref Range Status   Specimen Description   Final    BLOOD LEFT HAND Performed at [facility] [address] [place], [place] [address]    Special Requests   Final    BOTTLES DRAWN AEROBIC ONLY Blood Culture adequate volume Performed at [facility] [address] [place],  [address]    Culture  Setup Time   Final    GRAM NEGATIVE RODS AEROBIC BOTTLE ONLY Organism ID to follow CRITICAL RESULT CALLED TO, READ BACK BY AND VERIFIED WITH: Ulice Bold PharmD 9:25 11/23/20 (  [email or web address])    Culture   Final    NO GROWTH < 24 HOURS Performed at [facility], [place] [address], [place], [place] [address]    Report Status PENDING  Incomplete  Blood Culture ID Panel (Reflexed)     Status: Abnormal   Collection Time: 11/22/20  2:59 PM  Result Value Ref Range Status   Enterococcus faecalis NOT DETECTED NOT DETECTED Final   Enterococcus Faecium NOT DETECTED NOT  DETECTED Final   Listeria monocytogenes NOT DETECTED NOT DETECTED Final   Staphylococcus species NOT DETECTED NOT DETECTED Final   Staphylococcus aureus (BCID) NOT DETECTED NOT DETECTED Final   Staphylococcus epidermidis NOT DETECTED NOT DETECTED Final   Staphylococcus lugdunensis NOT DETECTED NOT DETECTED Final   Streptococcus species NOT DETECTED NOT DETECTED Final   Streptococcus agalactiae NOT DETECTED NOT DETECTED Final   Streptococcus pneumoniae NOT DETECTED NOT DETECTED Final   Streptococcus pyogenes NOT DETECTED NOT DETECTED Final   A.calcoaceticus-baumannii NOT DETECTED NOT DETECTED Final   Bacteroides fragilis NOT DETECTED NOT DETECTED Final   Enterobacterales DETECTED (A) NOT DETECTED Final    Comment: Enterobacterales represent a large order of gram negative bacteria, not a single organism. CRITICAL RESULT CALLED TO, READ BACK BY AND VERIFIED WITH: Ulice Bold PharmD 9:25 11/23/20 ([email or web address])    Enterobacter cloacae complex NOT DETECTED NOT DETECTED Final   Escherichia coli DETECTED (A) NOT DETECTED Final    Comment: CRITICAL RESULT CALLED TO, READ BACK BY AND VERIFIED WITH: Ulice Bold PharmD 9:25 11/23/20 ([email or web address])    Klebsiella aerogenes NOT DETECTED NOT DETECTED Final   Klebsiella oxytoca NOT DETECTED NOT DETECTED Final   Klebsiella pneumoniae NOT DETECTED NOT DETECTED Final   Proteus species NOT DETECTED NOT DETECTED Final   Salmonella species NOT DETECTED NOT DETECTED Final   Serratia marcescens NOT DETECTED NOT DETECTED Final   Haemophilus influenzae NOT DETECTED NOT DETECTED Final   Neisseria meningitidis NOT DETECTED NOT DETECTED Final   Pseudomonas aeruginosa NOT DETECTED NOT DETECTED Final   Stenotrophomonas maltophilia NOT DETECTED NOT DETECTED Final   Candida albicans NOT DETECTED NOT DETECTED Final   Candida auris NOT DETECTED NOT DETECTED Final   Candida glabrata NOT DETECTED NOT DETECTED Final   Candida krusei NOT DETECTED NOT DETECTED Final   Candida  parapsilosis NOT DETECTED NOT DETECTED Final   Candida tropicalis NOT DETECTED NOT DETECTED Final   Cryptococcus neoformans/gattii NOT DETECTED NOT DETECTED Final   CTX-M ESBL DETECTED (A) NOT DETECTED Final    Comment: CRITICAL RESULT CALLED TO, READ BACK BY AND VERIFIED WITH: Ulice Bold PharmD 9:25 11/23/20 ([email or web address]) (NOTE) Extended spectrum beta-lactamase detected. Recommend a carbapenem as initial therapy.      Carbapenem resistance IMP NOT DETECTED NOT DETECTED Final   Carbapenem resistance KPC NOT DETECTED NOT DETECTED Final   Carbapenem resistance NDM NOT DETECTED NOT DETECTED Final   Carbapenem resist OXA 48 LIKE NOT DETECTED NOT DETECTED Final   Carbapenem resistance VIM NOT DETECTED NOT DETECTED Final    Comment: Performed at [facility], [place] [address] [place], [place] [address]      Radiology Studies: DG Abd Portable 1V  Result Date: 11/22/2020 CLINICAL DATA:  Urinary retention EXAM: PORTABLE ABDOMEN - 1 VIEW COMPARISON:  11/16/2020 FINDINGS: Previously noted dilated gas-filled loops of bowel appear improved in caliber compatible with a resolving ileus or small bowel obstruction. No gross free intraperitoneal gas. Soft tissue opacity displaces bowel from the pelvis likely representing the known bladder mass. Ventral or  inguinal hernia repair with mesh has been performed. IMPRESSION: Improving small-bowel obstruction or ileus. Electronically Signed   By: Fidela Salisbury MD   On: 11/22/2020 01:59      Scheduled Meds: . (feeding supplement) PROSource Plus  30 mL Oral BID WC  . B-complex with vitamin C  1 tablet Oral Daily  . Chlorhexidine Gluconate Cloth  6 each Topical Daily  . feeding supplement  1 Container Oral BID WC  . feeding supplement  237 mL Oral TID BM  . folic acid  1 mg Oral Daily  . hydrocerin   Topical BID  . nicotine  14 mg Transdermal Daily  . senna-docusate  1 tablet Oral BID  . tamsulosin  0.4 mg Oral Nightly  . cyanocobalamin  1,000 mcg Oral  Daily   Continuous Infusions: . sodium chloride 75 mL/hr at 11/23/20 0335  . magnesium sulfate bolus IVPB 2 g (11/23/20 1331)  . meropenem (MERREM) IV 1 g (11/23/20 1217)  . potassium PHOSPHATE IVPB (in mmol)       LOS: 20 days      Time spent: 35 minutes   Dessa Phi, DO [facility] 11/23/2020, 2:07 PM   Available via Epic secure chat 7am-7pm After these hours, please refer to coverage provider listed on [email or web address]

## 2020-11-23 NOTE — Plan of Care (Signed)

## 2020-11-24 DIAGNOSIS — A4151 Sepsis due to Escherichia coli [E. coli]: Secondary | ICD-10-CM | POA: Diagnosis not present

## 2020-11-24 LAB — BASIC METABOLIC PANEL
Anion gap: 8 (ref 5–15)
BUN: 12 mg/dL (ref 8–23)
CO2: 19 mmol/L — ABNORMAL LOW (ref 22–32)
Calcium: 11 mg/dL — ABNORMAL HIGH (ref 8.9–10.3)
Chloride: 109 mmol/L (ref 98–111)
Creatinine, Ser: 0.91 mg/dL (ref 0.61–1.24)
GFR, Estimated: >60 mL/min (ref >60)
Glucose, Bld: 118 mg/dL — ABNORMAL HIGH (ref 70–99)
Potassium: 3.1 mmol/L — ABNORMAL LOW (ref 3.5–5.1)
Sodium: 136 mmol/L (ref 135–145)

## 2020-11-24 LAB — CBC
HCT: 29.6 % — ABNORMAL LOW (ref 39.0–52.0)
Hemoglobin: 9.4 g/dL — ABNORMAL LOW (ref 13.0–17.0)
MCH: 28.1 pg (ref 26.0–34.0)
MCHC: 31.8 g/dL (ref 30.0–36.0)
MCV: 88.6 fL (ref 80.0–100.0)
Platelets: 447 10*3/uL — ABNORMAL HIGH (ref 150–400)
RBC: 3.34 MIL/uL — ABNORMAL LOW (ref 4.22–5.81)
RDW: 15.9 % — ABNORMAL HIGH (ref 11.5–15.5)
WBC: 18.1 10*3/uL — ABNORMAL HIGH (ref 4.0–10.5)
nRBC: 0 % (ref 0.0–0.2)

## 2020-11-24 MED ORDER — POTASSIUM CHLORIDE IN NACL 40-0.9 MEQ/L-% IV SOLN
Freq: Once | INTRAVENOUS | Status: AC
  Filled 2020-11-24: qty 1000

## 2020-11-24 MED ORDER — SODIUM CHLORIDE 0.9 % IV SOLN: INTRAVENOUS | Status: DC

## 2020-11-24 NOTE — Progress Notes (Signed)
   11/24/20 1045  Assess: MEWS Score  Temp (!) 97.5 F (36.4 C)  BP (!) 98/56  Pulse Rate (!) 119  Resp 15  SpO2 95 %  O2 Device Room Air  Assess: MEWS Score  MEWS Temp 0  MEWS Systolic 1  MEWS Pulse 2  MEWS RR 0  MEWS LOC 0  MEWS Score 3  MEWS Score Color Yellow  Assess: if the MEWS score is Yellow or Red  Were vital signs taken at a resting state? Yes  Focused Assessment No change from prior assessment  Early Detection of Sepsis Score *See Row Information* Low  MEWS guidelines implemented *See Row Information* Yes  Take Vital Signs  Increase Vital Sign Frequency  Yellow: Q 2hr X 2 then Q 4hr X 2, if remains yellow, continue Q 4hrs  Notify: Charge Nurse/RN  Name of Charge Nurse/RN Notified Manus Gunning  Date Charge Nurse/RN Notified 11/24/20  Time Charge Nurse/RN Notified 1100  Document  Patient Outcome Other (Comment) (stable)  Progress note created (see row info) Yes

## 2020-11-24 NOTE — Progress Notes (Signed)
PROGRESS NOTE    Jamie Wilson  [id number] DOB: 11/06/1955 DOA: 11/02/2020 PCP: Patient, [name]     Brief Narrative:  Jamie Wilson is a 65 y.o.malewith medical history significant ofbladder CA w/ chronic foley placement; bowel obstruction s/p partial bowel resection w/ colostomy. Patient presented secondary to abdominal pain and found to have evidence of sepsis possibly secondary to necrotic bladder mass. Empiric antibiotics initiated. Urology and oncology consulted and recommendations given. Hospital course complicated by hypercalcemia. Bone scan does not show any lytic or blastic bone lesions suspicious for bone mets.AKI resolved.  Still with some degree of hypercalcemia.  He also had some GI symptoms including nausea, emesis and abdominal distention.  KUB on 1/19 raise concern for SBO but GI symptoms resolved the next day.   New events last 24 hours / Subjective: Continues to have some bladder pain especially with certain positioning of his Foley catheter.  Afebrile overnight  Assessment & Plan:   Active Problems:   Malnutrition ([place])   Sepsis ([place])   Protein-calorie malnutrition, severe   Lower urinary tract infectious disease   Palliative care by specialist   Goals of care, counseling/discussion   Sepsis secondary to E. 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  Now with repeat blood culture showing E. 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 Jeffie Pollock -Follow-up with oncology in 2 weeks, Dr. Irene Limbo   Hypercalcemia of malignancy -Status post zoledronic acid -Trended up slightly overnight,  11.0 today.  Resume IV fluids  AKI -Resolved  Severe protein calorie malnutrition -Estimated body mass index is 16.9 kg/m as calculated from the following:   Height as of this encounter: 6\' 1"  (1.854 m).   Weight as of this encounter: 58.1 kg.  Hypokalemia -Replace, trend   DVT prophylaxis:  SCDs Start: 11/03/20 2329  Code Status: Full code Family Communication: No family at bedside Disposition Plan:  Status is: Inpatient  Remains inpatient appropriate because:Inpatient level of care appropriate due to severity of illness   Dispo: The patient is from: Home              Anticipated d/c is to: SNF              Anticipated d/c date is: 3 days              Patient currently is not medically stable to d/c.  Remains on IV fluids and IV antibiotics today   Difficult to place patient No   Consultants:   Urology  Oncology  Palliative care medicine   Antimicrobials:  Anti-infectives (From admission, onward)   Start     Dose/Rate Route Frequency Ordered Stop   11/23/20 1000  meropenem (MERREM) 1 g in sodium chloride 0.9 % 100 mL IVPB        1 g 200 mL/hr over 30 Minutes Intravenous Every 8 hours 11/23/20 0937     11/12/20 2000  ciprofloxacin (CIPRO) tablet 500 mg        500 mg Oral 2 times daily 11/12/20 1543 11/17/20 2359   11/10/20 0800  ciprofloxacin (CIPRO) tablet 500 mg  Status:  Discontinued        500 mg Oral Daily with breakfast  11/09/20 1357 11/12/20 1543   11/06/20 1200  ciprofloxacin (CIPRO) tablet 500 mg  Status:  Discontinued        500 mg Oral 2 times daily 11/06/20 1114 11/09/20 1357   11/05/20 1500  cefdinir (OMNICEF) capsule 300 mg  Status:  Discontinued        300 mg Oral Every 12 hours 11/05/20 1337 11/06/20 1114   11/03/20 2200  vancomycin (VANCOREADY) IVPB 500 mg/100 mL  Status:  Discontinued        500 mg 100 mL/hr over 60 Minutes Intravenous Every 12 hours 11/03/20 1402 11/05/20 1335   11/03/20 1415  piperacillin-tazobactam (ZOSYN) IVPB 3.375 g   Status:  Discontinued        3.375 g 12.5 mL/hr over 240 Minutes Intravenous Every 8 hours 11/03/20 1401 11/05/20 1337   11/03/20 0645  vancomycin (VANCOCIN) IVPB 1000 mg/200 mL premix        1,000 mg 200 mL/hr over 60 Minutes Intravenous  Once 11/03/20 0631 11/03/20 1147   11/03/20 0600  piperacillin-tazobactam (ZOSYN) IVPB 4.5 g  Status:  Discontinued        4.5 g 200 mL/hr over 30 Minutes Intravenous  Once 11/03/20 0546 11/03/20 0547   11/03/20 0600  piperacillin-tazobactam (ZOSYN) IVPB 3.375 g        3.375 g 100 mL/hr over 30 Minutes Intravenous To Emergency Dept 11/03/20 0548 11/03/20 0650       Objective: Vitals:   11/24/20 0150 11/24/20 0508 11/24/20 0600 11/24/20 1045  BP: 109/69  108/60 (!) 98/56  Pulse: (!) 108  (!) 105 (!) 119  Resp: 16  16 15   Temp: 98.1 F (36.7 C)  98 F (36.7 C) (!) 97.5 F (36.4 C)  TempSrc: Oral  Oral Oral  SpO2: 96%  95% 95%  Weight:  58.1 kg    Height:        Intake/Output Summary (Last 24 hours) at 11/24/2020 1146 Last data filed at 11/24/2020 1000 Gross per 24 hour  Intake 1703.01 ml  Output 2600 ml  Net -896.99 ml   Filed Weights   11/03/20 0852 11/23/20 1017 11/24/20 0508  Weight: 50.7 kg 59.9 kg 58.1 kg    Examination: General exam: Appears calm and comfortable  Respiratory system: Clear to auscultation. Respiratory effort normal. Cardiovascular system: S1 & S2 heard, tachycardic, regular rhythm. No pedal edema. Gastrointestinal system: Abdomen is nondistended, soft and nontender. Normal bowel sounds heard. Central nervous system: Alert and oriented. Non focal exam. Speech clear  Extremities: Symmetric in appearance bilaterally  Skin: No rashes, lesions or ulcers on exposed skin  Psychiatry: Judgement and insight appear stable. Mood & affect appropriate.   Data Reviewed: I have personally reviewed following labs and imaging studies  CBC: Recent Labs  Lab 11/19/20 0102 11/20/20 0537 11/22/20 0516 11/23/20 0515  11/24/20 0507  WBC 17.4* 19.3* 25.2* 21.6* 18.1*  HGB 9.8* 9.8* 9.5* 9.0* 9.4*  HCT 31.6* 32.3* 30.8* 29.4* 29.6*  MCV 90.5 91.5 91.1 90.5 88.6  PLT 491* 530* 524* 420* [id number]*   Basic Metabolic Panel: Recent Labs  Lab 11/18/20 0522 11/19/20 0102 11/20/20 0537 11/22/20 0516 11/23/20 0515 11/24/20 0507  NA 137 136 136 139 136 136  K 3.6 3.8 3.7 3.0* 3.5 3.1*  CL 112* 109 111 114* 112* 109  CO2 19* 20* 20* 17* 18* 19*  GLUCOSE 101* 88 95 122* 105* 118*  BUN 12 10 10 15 12 12   CREATININE 0.88 0.81 0.84 0.85 0.86 0.91  CALCIUM 10.6*  10.5* 11.2* 10.6* 10.4* 11.0*  MG 1.7 1.6* 2.0 1.7 1.7  --   PHOS 1.5* 2.3* 2.0* 1.2* 2.1*  --    GFR: Estimated Creatinine Clearance: 66.5 mL/min (by C-G formula based on SCr of 0.91 mg/dL). Liver Function Tests: Recent Labs  Lab 11/18/20 0522 11/19/20 0102 11/20/20 0537 11/22/20 0516 11/23/20 0515  AST  --   --   --   --  16  ALT  --   --   --   --  12  ALKPHOS  --   --   --   --  94  BILITOT  --   --   --   --  0.3  PROT  --   --   --   --  6.3*  ALBUMIN 2.1* 2.2* 2.2* 2.1* 2.0*   No results for input(s): LIPASE, AMYLASE in the last 168 hours. No results for input(s): AMMONIA in the last 168 hours. Coagulation Profile: No results for input(s): INR, PROTIME in the last 168 hours. Cardiac Enzymes: No results for input(s): CKTOTAL, CKMB, CKMBINDEX, TROPONINI in the last 168 hours. BNP (last 3 results) No results for input(s): PROBNP in the last 8760 hours. HbA1C: No results for input(s): HGBA1C in the last 72 hours. CBG: Recent Labs  Lab 11/18/20 0407 11/20/20 0514 11/21/20 0512 11/22/20 0707 11/23/20 0558  GLUCAP 100* 84 92 98 112*   Lipid Profile: No results for input(s): CHOL, HDL, LDLCALC, TRIG, CHOLHDL, LDLDIRECT in the last 72 hours. Thyroid Function Tests: Recent Labs    11/23/20 0515  TSH 1.170   Anemia Panel: No results for input(s): VITAMINB12, FOLATE, FERRITIN, TIBC, IRON, RETICCTPCT in the last 72  hours. Sepsis Labs: Recent Labs  Lab 11/22/20 1459  LATICACIDVEN 1.3    Recent Results (from the past 240 hour(s))  Culture, blood (routine x 2)     Status: None (Preliminary result)   Collection Time: 11/22/20  2:59 PM   Specimen: BLOOD  Result Value Ref Range Status   Specimen Description   Final    BLOOD LEFT ANTECUBITAL Performed at [facility] [address] [place], [place] [address]    Special Requests   Final    BOTTLES DRAWN AEROBIC ONLY Blood Culture adequate volume Performed at [facility] [address] [place], [place] [address]    Culture  Setup Time   Final    GRAM NEGATIVE RODS AEROBIC BOTTLE ONLY CRITICAL VALUE NOTED.  VALUE IS CONSISTENT WITH PREVIOUSLY REPORTED AND CALLED VALUE.    Culture   Final    GRAM NEGATIVE RODS IDENTIFICATION TO FOLLOW CULTURE REINCUBATED FOR BETTER GROWTH Performed at [facility], [place] [address] [place], [place] [address]    Report Status PENDING  Incomplete  Culture, blood (routine x 2)     Status: Abnormal (Preliminary result)   Collection Time: 11/22/20  2:59 PM   Specimen: BLOOD LEFT HAND  Result Value Ref Range Status   Specimen Description   Final    BLOOD LEFT HAND Performed at [facility] [address] [place], [place] [address]    Special Requests   Final    BOTTLES DRAWN AEROBIC ONLY Blood Culture adequate volume Performed at [facility] [address], [place], [place] [address]    Culture  Setup Time   Final    GRAM NEGATIVE RODS AEROBIC BOTTLE ONLY CRITICAL RESULT CALLED TO, READ BACK BY AND VERIFIED WITH: Ulice Bold PharmD  9:25 11/23/20 ([email or web address])    Culture (A)  Final    ESCHERICHIA COLI CULTURE REINCUBATED FOR BETTER GROWTH Performed at [facility], [place] [address] [place], [place] [address]    Report Status PENDING  Incomplete  Blood Culture ID Panel (Reflexed)     Status: Abnormal   Collection Time:  11/22/20  2:59 PM  Result Value Ref Range Status   Enterococcus faecalis NOT DETECTED NOT DETECTED Final   Enterococcus Faecium NOT DETECTED NOT DETECTED Final   Listeria monocytogenes NOT DETECTED NOT DETECTED Final   Staphylococcus species NOT DETECTED NOT DETECTED Final   Staphylococcus aureus (BCID) NOT DETECTED NOT DETECTED Final   Staphylococcus epidermidis NOT DETECTED NOT DETECTED Final   Staphylococcus lugdunensis NOT DETECTED NOT DETECTED Final   Streptococcus species NOT DETECTED NOT DETECTED Final   Streptococcus agalactiae NOT DETECTED NOT DETECTED Final   Streptococcus pneumoniae NOT DETECTED NOT DETECTED Final   Streptococcus pyogenes NOT DETECTED NOT DETECTED Final   A.calcoaceticus-baumannii NOT DETECTED NOT DETECTED Final   Bacteroides fragilis NOT DETECTED NOT DETECTED Final   Enterobacterales DETECTED (A) NOT DETECTED Final    Comment: Enterobacterales represent a large order of gram negative bacteria, not a single organism. CRITICAL RESULT CALLED TO, READ BACK BY AND VERIFIED WITH: Ulice Bold PharmD 9:25 11/23/20 ([email or web address])    Enterobacter cloacae complex NOT DETECTED NOT DETECTED Final   Escherichia coli DETECTED (A) NOT DETECTED Final    Comment: CRITICAL RESULT CALLED TO, READ BACK BY AND VERIFIED WITH: Ulice Bold PharmD 9:25 11/23/20 ([email or web address])    Klebsiella aerogenes NOT DETECTED NOT DETECTED Final   Klebsiella oxytoca NOT DETECTED NOT DETECTED Final   Klebsiella pneumoniae NOT DETECTED NOT DETECTED Final   Proteus species NOT DETECTED NOT DETECTED Final   Salmonella species NOT DETECTED NOT DETECTED Final   Serratia marcescens NOT DETECTED NOT DETECTED Final   Haemophilus influenzae NOT DETECTED NOT DETECTED Final   Neisseria meningitidis NOT DETECTED NOT DETECTED Final   Pseudomonas aeruginosa NOT DETECTED NOT DETECTED Final   Stenotrophomonas maltophilia NOT DETECTED NOT DETECTED Final   Candida albicans NOT DETECTED NOT DETECTED Final   Candida auris NOT  DETECTED NOT DETECTED Final   Candida glabrata NOT DETECTED NOT DETECTED Final   Candida krusei NOT DETECTED NOT DETECTED Final   Candida parapsilosis NOT DETECTED NOT DETECTED Final   Candida tropicalis NOT DETECTED NOT DETECTED Final   Cryptococcus neoformans/gattii NOT DETECTED NOT DETECTED Final   CTX-M ESBL DETECTED (A) NOT DETECTED Final    Comment: CRITICAL RESULT CALLED TO, READ BACK BY AND VERIFIED WITH: Ulice Bold PharmD 9:25 11/23/20 ([email or web address]) (NOTE) Extended spectrum beta-lactamase detected. Recommend a carbapenem as initial therapy.      Carbapenem resistance IMP NOT DETECTED NOT DETECTED Final   Carbapenem resistance KPC NOT DETECTED NOT DETECTED Final   Carbapenem resistance NDM NOT DETECTED NOT DETECTED Final   Carbapenem resist OXA 48 LIKE NOT DETECTED NOT DETECTED Final   Carbapenem resistance VIM NOT DETECTED NOT DETECTED Final    Comment: Performed at [facility], [place] [address] [place], [place] [address]      Radiology Studies: No results found.    Scheduled Meds: . (feeding supplement) PROSource Plus  30 mL Oral BID WC  . B-complex with vitamin C  1 tablet Oral Daily  . Chlorhexidine Gluconate Cloth  6 each Topical Daily  . feeding supplement  1 Container Oral BID WC  . feeding supplement  237 mL Oral TID BM  .  folic acid  1 mg Oral Daily  . hydrocerin   Topical BID  . nicotine  14 mg Transdermal Daily  . senna-docusate  1 tablet Oral BID  . tamsulosin  0.4 mg Oral Nightly  . cyanocobalamin  1,000 mcg Oral Daily   Continuous Infusions: . meropenem (MERREM) IV 1 g (11/24/20 0930)     LOS: 21 days      Time spent: 25 minutes   Dessa Phi, DO [facility] 11/24/2020, 11:46 AM   Available via Epic secure chat 7am-7pm After these hours, please refer to coverage provider listed on [email or web address]

## 2020-11-24 NOTE — TOC Progression Note (Addendum)
Transition of Care [facility]) - Progression Note    Patient Details  Name: Jamie Wilson MRN: [id number] Date of Birth: 1956-03-24  Transition of Care [facility]) CM/SW [place], [place] Phone Number: 11/24/2020, 11:59 AM  Clinical Narrative:    Patient is not medically stable to transfer to SNF. The facility staff [facility] aware and closely following the patient.  IV Merrem will be too expensive for the SNF  to supply if the patient needs it at discharge.   TOC staff will continue to follow.    Expected Discharge Plan: Skilled Nursing Facility Barriers to Discharge: Continued Medical Work up  Expected Discharge Plan and Services Expected Discharge Plan: [place]         Expected Discharge Date:  (unknown)                                     Social Determinants of Health (SDOH) Interventions    Readmission Risk Interventions Readmission Risk Prevention Plan 10/04/2020  Transportation Screening Complete  PCP or Specialist Appt within 5-7 Days Complete  Home Care Screening Complete  Medication Review (RN CM) Complete

## 2020-11-24 NOTE — Progress Notes (Signed)
Occupational Therapy Discharge Patient Details Name: Jamie Wilson MRN: [id number] DOB: [birth year] Today's Date: 11/24/2020 Time:  -     Patient discharged from OT services secondary to patient has refused 3 (three) consecutive times without medical reason.  Patient has refused since OT evaluation on 11/06/20 to participate with therapy. Explained to patient that because of 4-5 refusals in a row he will be discontinued from OT services until he will participate. Patient shook head yes in understanding.   Please re-order only when patient willing to mobilize OOB with therapy. Thank you  Delbert Phenix OT OT pager: [phone] 11/24/2020, 12:57 PM

## 2020-11-25 DIAGNOSIS — A4151 Sepsis due to Escherichia coli [E. coli]: Secondary | ICD-10-CM | POA: Diagnosis not present

## 2020-11-25 LAB — CBC
HCT: 26.1 % — ABNORMAL LOW (ref 39.0–52.0)
Hemoglobin: 8.2 g/dL — ABNORMAL LOW (ref 13.0–17.0)
MCH: 28.3 pg (ref 26.0–34.0)
MCHC: 31.4 g/dL (ref 30.0–36.0)
MCV: 90 fL (ref 80.0–100.0)
Platelets: 396 10*3/uL (ref 150–400)
RBC: 2.9 MIL/uL — ABNORMAL LOW (ref 4.22–5.81)
RDW: 15.9 % — ABNORMAL HIGH (ref 11.5–15.5)
WBC: 15.1 10*3/uL — ABNORMAL HIGH (ref 4.0–10.5)
nRBC: 0 % (ref 0.0–0.2)

## 2020-11-25 LAB — BASIC METABOLIC PANEL
Anion gap: 7 (ref 5–15)
BUN: 14 mg/dL (ref 8–23)
CO2: 20 mmol/L — ABNORMAL LOW (ref 22–32)
Calcium: 10.4 mg/dL — ABNORMAL HIGH (ref 8.9–10.3)
Chloride: 109 mmol/L (ref 98–111)
Creatinine, Ser: 0.81 mg/dL (ref 0.61–1.24)
GFR, Estimated: >60 mL/min (ref >60)
Glucose, Bld: 102 mg/dL — ABNORMAL HIGH (ref 70–99)
Potassium: 2.7 mmol/L — CL (ref 3.5–5.1)
Sodium: 136 mmol/L (ref 135–145)

## 2020-11-25 LAB — GLUCOSE, CAPILLARY: Glucose-Capillary: 118 mg/dL — ABNORMAL HIGH (ref 70–99)

## 2020-11-25 MED ORDER — POTASSIUM CHLORIDE CRYS ER 20 MEQ PO TBCR
40.0000 meq | EXTENDED_RELEASE_TABLET | ORAL | Status: AC
Start: 2020-11-25 — End: 2020-11-25
  Administered 2020-11-25: 10:00:00 40 meq via ORAL
  Filled 2020-11-25 (×2): qty 2

## 2020-11-25 NOTE — Progress Notes (Signed)
PROGRESS NOTE    Jamie Wilson  JSE:[id number] DOB: [birth year] DOA: 11/02/2020 PCP: Patient, [name]     Brief Narrative:  Jamie Wilson is a 65 y.o.malewith medical history significant ofbladder CA w/ chronic foley placement; bowel obstruction s/p partial bowel resection w/ colostomy. Patient presented secondary to abdominal pain and found to have evidence of sepsis possibly secondary to necrotic bladder mass. Empiric antibiotics initiated. Urology and oncology consulted and recommendations given. Hospital course complicated by hypercalcemia. Bone scan does not show any lytic or blastic bone lesions suspicious for bone mets.AKI resolved.  Still with some degree of hypercalcemia.  He also had some GI symptoms including nausea, emesis and abdominal distention.  KUB on 1/19 raise concern for SBO but GI symptoms resolved the next day.   New events last 24 hours / Subjective: Had some issues with his bladder not emptying into the Foley catheter.  This was flushed by RN with improvement.  Assessment & Plan:   Active Problems:   Malnutrition ([place])   Sepsis ([place])   Protein-calorie malnutrition, severe   Lower urinary tract infectious disease   Palliative care by specialist   Goals of care, counseling/discussion   Sepsis secondary to E. 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  Now with repeat blood culture showing E. 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 Jeffie Pollock -Follow-up with oncology in 2 weeks,  Dr. Irene Limbo   Hypercalcemia of malignancy -Status post zoledronic acid -Improved on IV fluid  AKI -Resolved  Severe protein calorie malnutrition -Estimated body mass index is 16.9 kg/m as calculated from the following:   Height as of this encounter: 6\' 1"  (1.854 m).   Weight as of this encounter: 58.1 kg.  Hypokalemia -Replace, trend   DVT prophylaxis:  SCDs Start: 11/03/20 2329  Code Status: Full code Family Communication: No family at bedside Disposition Plan:  Status is: Inpatient  Remains inpatient appropriate because:Inpatient level of care appropriate due to severity of illness   Dispo: The patient is from: Home              Anticipated d/c is to: SNF              Anticipated d/c date is: 2 days              Patient currently is not medically stable to d/c.  Remains on IV fluids and IV antibiotics today.  Await sensitivity result for blood culture to de-escalate antibiotics prior to discharge   Difficult to place patient No   Consultants:   Urology  Oncology  Palliative care medicine   Antimicrobials:  Anti-infectives (From admission, onward)   Start     Dose/Rate Route Frequency Ordered Stop   11/23/20 1000  meropenem (MERREM) 1 g in sodium chloride 0.9 % 100 mL IVPB        1 g 200 mL/hr over 30 Minutes Intravenous Every 8 hours 11/23/20 0937     11/12/20 2000  ciprofloxacin (CIPRO) tablet 500 mg        500 mg Oral 2 times daily 11/12/20 1543 11/17/20 2359  11/10/20 0800  ciprofloxacin (CIPRO) tablet 500 mg  Status:  Discontinued        500 mg Oral Daily with breakfast 11/09/20 1357 11/12/20 1543   11/06/20 1200  ciprofloxacin (CIPRO) tablet 500 mg  Status:  Discontinued        500 mg Oral 2 times daily 11/06/20 1114 11/09/20 1357   11/05/20 1500  cefdinir (OMNICEF) capsule 300 mg  Status:  Discontinued        300 mg Oral Every 12 hours 11/05/20 1337 11/06/20 1114   11/03/20 2200  vancomycin (VANCOREADY) IVPB 500 mg/100 mL  Status:  Discontinued         500 mg 100 mL/hr over 60 Minutes Intravenous Every 12 hours 11/03/20 1402 11/05/20 1335   11/03/20 1415  piperacillin-tazobactam (ZOSYN) IVPB 3.375 g  Status:  Discontinued        3.375 g 12.5 mL/hr over 240 Minutes Intravenous Every 8 hours 11/03/20 1401 11/05/20 1337   11/03/20 0645  vancomycin (VANCOCIN) IVPB 1000 mg/200 mL premix        1,000 mg 200 mL/hr over 60 Minutes Intravenous  Once 11/03/20 0631 11/03/20 1147   11/03/20 0600  piperacillin-tazobactam (ZOSYN) IVPB 4.5 g  Status:  Discontinued        4.5 g 200 mL/hr over 30 Minutes Intravenous  Once 11/03/20 0546 11/03/20 0547   11/03/20 0600  piperacillin-tazobactam (ZOSYN) IVPB 3.375 g        3.375 g 100 mL/hr over 30 Minutes Intravenous To Emergency Dept 11/03/20 0548 11/03/20 0650       Objective: Vitals:   11/24/20 2142 11/25/20 0204 11/25/20 0549 11/25/20 0928  BP: 93/66 105/62 114/63 120/73  Pulse: (!) 111 (!) 109 (!) 113 (!) 121  Resp: 18 17 18 16   Temp: 98.4 F (36.9 C) 97.9 F (36.6 C) 98.2 F (36.8 C) 98.2 F (36.8 C)  TempSrc: Oral Oral Oral Oral  SpO2: 96% 96% 97% 94%  Weight:      Height:        Intake/Output Summary (Last 24 hours) at 11/25/2020 1033 Last data filed at 11/25/2020 1023 Gross per 24 hour  Intake 2852.44 ml  Output 2080 ml  Net 772.44 ml   Filed Weights   11/03/20 0852 11/23/20 1017 11/24/20 0508  Weight: 50.7 kg 59.9 kg 58.1 kg    Examination: General exam: Appears calm and comfortable  Respiratory system: Clear to auscultation. Respiratory effort normal. Cardiovascular system: S1 & S2 heard, tachycardic, regular. No pedal edema. Gastrointestinal system: Abdomen is nondistended, soft and nontender. Normal bowel sounds heard. Central nervous system: Alert and oriented. Non focal exam. Speech clear  Extremities: Symmetric in appearance bilaterally  Skin: No rashes, lesions or ulcers on exposed skin  Psychiatry: Judgement and insight appear stable. Mood & affect appropriate.      Data Reviewed: I have personally reviewed following labs and imaging studies  CBC: Recent Labs  Lab 11/20/20 0537 11/22/20 0516 11/23/20 0515 11/24/20 0507 11/25/20 0528  WBC 19.3* 25.2* 21.6* 18.1* 15.1*  HGB 9.8* 9.5* 9.0* 9.4* 8.2*  HCT 32.3* 30.8* 29.4* 29.6* 26.1*  MCV 91.5 91.1 90.5 88.6 90.0  PLT 530* 524* 420* 447* [id number]   Basic Metabolic Panel: Recent Labs  Lab 11/19/20 0102 11/20/20 0537 11/22/20 0516 11/23/20 0515 11/24/20 0507 11/25/20 0528  NA 136 136 139 136 136 136  K 3.8 3.7 3.0* 3.5 3.1* 2.7*  CL 109 111 114* 112* 109 109  CO2 20* 20* 17* 18* 19* 20*  GLUCOSE 88 95 122* 105* 118* 102*  BUN 10 10 15 12 12 14   CREATININE 0.81 0.84 0.85 0.86 0.91 0.81  CALCIUM 10.5* 11.2* 10.6* 10.4* 11.0* 10.4*  MG 1.6* 2.0 1.7 1.7  --   --   PHOS 2.3* 2.0* 1.2* 2.1*  --   --    GFR: Estimated Creatinine Clearance: 74.7 mL/min (by C-G formula based on SCr of 0.81 mg/dL). Liver Function Tests: Recent Labs  Lab 11/19/20 0102 11/20/20 0537 11/22/20 0516 11/23/20 0515  AST  --   --   --  16  ALT  --   --   --  12  ALKPHOS  --   --   --  94  BILITOT  --   --   --  0.3  PROT  --   --   --  6.3*  ALBUMIN 2.2* 2.2* 2.1* 2.0*   No results for input(s): LIPASE, AMYLASE in the last 168 hours. No results for input(s): AMMONIA in the last 168 hours. Coagulation Profile: No results for input(s): INR, PROTIME in the last 168 hours. Cardiac Enzymes: No results for input(s): CKTOTAL, CKMB, CKMBINDEX, TROPONINI in the last 168 hours. BNP (last 3 results) No results for input(s): PROBNP in the last 8760 hours. HbA1C: No results for input(s): HGBA1C in the last 72 hours. CBG: Recent Labs  Lab 11/20/20 0514 11/21/20 0512 11/22/20 0707 11/23/20 0558 11/25/20 0648  GLUCAP 84 92 98 112* 118*   Lipid Profile: No results for input(s): CHOL, HDL, LDLCALC, TRIG, CHOLHDL, LDLDIRECT in the last 72 hours. Thyroid Function Tests: Recent Labs    11/23/20 0515  TSH  1.170   Anemia Panel: No results for input(s): VITAMINB12, FOLATE, FERRITIN, TIBC, IRON, RETICCTPCT in the last 72 hours. Sepsis Labs: Recent Labs  Lab 11/22/20 1459  LATICACIDVEN 1.3    Recent Results (from the past 240 hour(s))  Culture, blood (routine x 2)     Status: None (Preliminary result)   Collection Time: 11/22/20  2:59 PM   Specimen: BLOOD  Result Value Ref Range Status   Specimen Description   Final    BLOOD LEFT ANTECUBITAL Performed at [facility] [address], [place], [place] [address]    Special Requests   Final    BOTTLES DRAWN AEROBIC ONLY Blood Culture adequate volume Performed at [facility] [address] [place], [place] [address]    Culture  Setup Time   Final    GRAM NEGATIVE RODS AEROBIC BOTTLE ONLY CRITICAL VALUE NOTED.  VALUE IS CONSISTENT WITH PREVIOUSLY REPORTED AND CALLED VALUE.    Culture   Final    GRAM NEGATIVE RODS IDENTIFICATION TO FOLLOW Performed at [facility], [place] [address] [place], [place] [address]    Report Status PENDING  Incomplete  Culture, blood (routine x 2)     Status: Abnormal (Preliminary result)   Collection Time: 11/22/20  2:59 PM   Specimen: BLOOD LEFT HAND  Result Value Ref Range Status   Specimen Description   Final    BLOOD LEFT HAND Performed at [facility] [address], [place], [place] [address]    Special Requests   Final    BOTTLES DRAWN AEROBIC ONLY Blood Culture adequate volume Performed at [facility] [address] [place], [place] [address]    Culture  Setup Time   Final    GRAM NEGATIVE RODS AEROBIC BOTTLE ONLY CRITICAL RESULT CALLED TO, READ BACK  BY AND VERIFIED WITH: Ulice Bold PharmD 9:25 11/23/20 ([email or web address])    Culture (A)  Final    ESCHERICHIA COLI SUSCEPTIBILITIES TO FOLLOW Performed at [facility], [place] [address], [place], [place] [address]    Report Status PENDING  Incomplete  Blood  Culture ID Panel (Reflexed)     Status: Abnormal   Collection Time: 11/22/20  2:59 PM  Result Value Ref Range Status   Enterococcus faecalis NOT DETECTED NOT DETECTED Final   Enterococcus Faecium NOT DETECTED NOT DETECTED Final   Listeria monocytogenes NOT DETECTED NOT DETECTED Final   Staphylococcus species NOT DETECTED NOT DETECTED Final   Staphylococcus aureus (BCID) NOT DETECTED NOT DETECTED Final   Staphylococcus epidermidis NOT DETECTED NOT DETECTED Final   Staphylococcus lugdunensis NOT DETECTED NOT DETECTED Final   Streptococcus species NOT DETECTED NOT DETECTED Final   Streptococcus agalactiae NOT DETECTED NOT DETECTED Final   Streptococcus pneumoniae NOT DETECTED NOT DETECTED Final   Streptococcus pyogenes NOT DETECTED NOT DETECTED Final   A.calcoaceticus-baumannii NOT DETECTED NOT DETECTED Final   Bacteroides fragilis NOT DETECTED NOT DETECTED Final   Enterobacterales DETECTED (A) NOT DETECTED Final    Comment: Enterobacterales represent a large order of gram negative bacteria, not a single organism. CRITICAL RESULT CALLED TO, READ BACK BY AND VERIFIED WITH: Ulice Bold PharmD 9:25 11/23/20 ([email or web address])    Enterobacter cloacae complex NOT DETECTED NOT DETECTED Final   Escherichia coli DETECTED (A) NOT DETECTED Final    Comment: CRITICAL RESULT CALLED TO, READ BACK BY AND VERIFIED WITH: Ulice Bold PharmD 9:25 11/23/20 ([email or web address])    Klebsiella aerogenes NOT DETECTED NOT DETECTED Final   Klebsiella oxytoca NOT DETECTED NOT DETECTED Final   Klebsiella pneumoniae NOT DETECTED NOT DETECTED Final   Proteus species NOT DETECTED NOT DETECTED Final   Salmonella species NOT DETECTED NOT DETECTED Final   Serratia marcescens NOT DETECTED NOT DETECTED Final   Haemophilus influenzae NOT DETECTED NOT DETECTED Final   Neisseria meningitidis NOT DETECTED NOT DETECTED Final   Pseudomonas aeruginosa NOT DETECTED NOT DETECTED Final   Stenotrophomonas maltophilia NOT DETECTED NOT DETECTED Final    Candida albicans NOT DETECTED NOT DETECTED Final   Candida auris NOT DETECTED NOT DETECTED Final   Candida glabrata NOT DETECTED NOT DETECTED Final   Candida krusei NOT DETECTED NOT DETECTED Final   Candida parapsilosis NOT DETECTED NOT DETECTED Final   Candida tropicalis NOT DETECTED NOT DETECTED Final   Cryptococcus neoformans/gattii NOT DETECTED NOT DETECTED Final   CTX-M ESBL DETECTED (A) NOT DETECTED Final    Comment: CRITICAL RESULT CALLED TO, READ BACK BY AND VERIFIED WITH: Ulice Bold PharmD 9:25 11/23/20 ([email or web address]) (NOTE) Extended spectrum beta-lactamase detected. Recommend a carbapenem as initial therapy.      Carbapenem resistance IMP NOT DETECTED NOT DETECTED Final   Carbapenem resistance KPC NOT DETECTED NOT DETECTED Final   Carbapenem resistance NDM NOT DETECTED NOT DETECTED Final   Carbapenem resist OXA 48 LIKE NOT DETECTED NOT DETECTED Final   Carbapenem resistance VIM NOT DETECTED NOT DETECTED Final    Comment: Performed at [facility], [place] [address], [place], [place] [address]      Radiology Studies: No results found.    Scheduled Meds: . (feeding supplement) PROSource Plus  30 mL Oral BID WC  . B-complex with vitamin C  1 tablet Oral Daily  . Chlorhexidine Gluconate Cloth  6 each Topical Daily  . feeding supplement  1 Container Oral BID WC  . feeding supplement  237 mL Oral TID BM  . folic acid  1 mg Oral Daily  . hydrocerin   Topical BID  . nicotine  14 mg Transdermal Daily  . potassium chloride  40 mEq Oral Q4H  . senna-docusate  1 tablet Oral BID  . tamsulosin  0.4 mg Oral Nightly  . cyanocobalamin  1,000 mcg Oral Daily   Continuous Infusions: . sodium chloride 75 mL/hr at 11/25/20 0155  . meropenem (MERREM) IV 1 g (11/25/20 1023)     LOS: 22 days      Time spent: 25 minutes   Dessa Phi, DO [facility] 11/25/2020, 10:33 AM   Available via Epic secure chat 7am-7pm After these hours, please refer to coverage provider listed  on [email or web address]

## 2020-11-25 NOTE — Plan of Care (Signed)
  Problem: Coping: Goal: Level of anxiety will decrease Outcome: Progressing   Problem: Pain Managment: Goal: General experience of comfort will improve Outcome: Progressing   Problem: Safety: Goal: Ability to remain free from injury will improve Outcome: Progressing   Problem: Skin Integrity: Goal: Risk for impaired skin integrity will decrease Outcome: Progressing   

## 2020-11-25 NOTE — Progress Notes (Signed)
CRITICAL VALUE ALERT  Critical Value:  Potassium 2.7  Date & Time Notied:  11/25/2020 at 0715  Provider Notified: text paged on-call through [facility]  Orders Received/Actions taken: awaiting any orders

## 2020-11-25 NOTE — TOC Progression Note (Signed)
Transition of Care [facility]) - Progression Note    Patient Details  Name: Jamie Wilson MRN: [id number] Date of Birth: 1956/08/13  [facility]) CM/SW [place], LCSW Phone Number: 11/25/2020, 3:22 PM  Clinical Narrative:    SNF will accept the patient Monday. Physician notified.   Expected Discharge Plan: Skilled Nursing Facility Barriers to Discharge: Continued Medical Work up  Expected Discharge Plan and Services Expected Discharge Plan: [place]         Expected Discharge Date:  (unknown)                                     Social Determinants of Health (SDOH) Interventions    Readmission Risk Interventions Readmission Risk Prevention Plan 10/04/2020  Transportation Screening Complete  PCP or Specialist Appt within 5-7 Days Complete  Home Care Screening Complete  Medication Review (RN CM) Complete

## 2020-11-25 NOTE — Progress Notes (Signed)
Patient states he will not take more than two potassium pills per day he states that it will make him have multiple bowel movements. I explained the dangers of having low potassium and patient still declined.

## 2020-11-25 NOTE — Progress Notes (Signed)
PT Cancellation Note  Patient Details Name: Jamie Wilson MRN: [id number] DOB: 10/28/1956   Cancelled Treatment:    Reason Eval/Treat Not Completed: Other (comment) (Patient K+ 2.7 today. Will follow up when medically ready for PT. Patient planning to DC to SNF on Monday, will follow up as schedule allows.)   [name] PT, DPT Acute Rehabilitation Services Office [phone] Pager [phone]    [name] 11/25/2020, 6:25 PM

## 2020-11-26 DIAGNOSIS — A4151 Sepsis due to Escherichia coli [E. coli]: Secondary | ICD-10-CM | POA: Diagnosis not present

## 2020-11-26 LAB — CULTURE, BLOOD (ROUTINE X 2)
Special Requests: ADEQUATE
Special Requests: ADEQUATE

## 2020-11-26 LAB — BASIC METABOLIC PANEL
Anion gap: 9 (ref 5–15)
BUN: 10 mg/dL (ref 8–23)
CO2: 19 mmol/L — ABNORMAL LOW (ref 22–32)
Calcium: 10.2 mg/dL (ref 8.9–10.3)
Chloride: 109 mmol/L (ref 98–111)
Creatinine, Ser: 0.73 mg/dL (ref 0.61–1.24)
GFR, Estimated: >60 mL/min (ref >60)
Glucose, Bld: 110 mg/dL — ABNORMAL HIGH (ref 70–99)
Potassium: 2.6 mmol/L — CL (ref 3.5–5.1)
Sodium: 137 mmol/L (ref 135–145)

## 2020-11-26 LAB — CBC
HCT: 27.3 % — ABNORMAL LOW (ref 39.0–52.0)
Hemoglobin: 8.3 g/dL — ABNORMAL LOW (ref 13.0–17.0)
MCH: 27.3 pg (ref 26.0–34.0)
MCHC: 30.4 g/dL (ref 30.0–36.0)
MCV: 89.8 fL (ref 80.0–100.0)
Platelets: 429 10*3/uL — ABNORMAL HIGH (ref 150–400)
RBC: 3.04 MIL/uL — ABNORMAL LOW (ref 4.22–5.81)
RDW: 15.7 % — ABNORMAL HIGH (ref 11.5–15.5)
WBC: 14.1 10*3/uL — ABNORMAL HIGH (ref 4.0–10.5)
nRBC: 0 % (ref 0.0–0.2)

## 2020-11-26 LAB — GLUCOSE, CAPILLARY: Glucose-Capillary: 116 mg/dL — ABNORMAL HIGH (ref 70–99)

## 2020-11-26 LAB — MAGNESIUM: Magnesium: 1.7 mg/dL (ref 1.7–2.4)

## 2020-11-26 MED ORDER — POTASSIUM CHLORIDE CRYS ER 20 MEQ PO TBCR
40.0000 meq | EXTENDED_RELEASE_TABLET | Freq: Once | ORAL | Status: AC
  Administered 2020-11-26: 09:00:00 40 meq via ORAL
  Filled 2020-11-26: qty 2

## 2020-11-26 MED ORDER — SODIUM CHLORIDE 0.9 % IV BOLUS
1000.0000 mL | Freq: Once | INTRAVENOUS | Status: AC
  Administered 2020-11-26: 19:00:00 1000 mL via INTRAVENOUS

## 2020-11-26 MED ORDER — KETOROLAC TROMETHAMINE 15 MG/ML IJ SOLN
15.0000 mg | Freq: Four times a day (QID) | INTRAMUSCULAR | Status: AC | PRN
  Administered 2020-11-26 – 2020-11-30 (×7): 15 mg via INTRAVENOUS
  Filled 2020-11-26 (×8): qty 1

## 2020-11-26 MED ORDER — POTASSIUM CHLORIDE IN NACL 40-0.9 MEQ/L-% IV SOLN
INTRAVENOUS | Status: DC
  Filled 2020-11-26 (×4): qty 1000

## 2020-11-26 NOTE — Progress Notes (Signed)
PROGRESS NOTE    Jamie Wilson  [id number] DOB: May 20, 1956 DOA: 11/02/2020 PCP: Patient, [name]     Brief Narrative:  Jamie Wilson is a 65 y.o.malewith medical history significant ofbladder CA w/ chronic foley placement; bowel obstruction s/p partial bowel resection w/ colostomy. Patient presented secondary to abdominal pain and found to have evidence of sepsis possibly secondary to necrotic bladder mass. Empiric antibiotics initiated. Urology and oncology consulted and recommendations given. Hospital course complicated by hypercalcemia. Bone scan does not show any lytic or blastic bone lesions suspicious for bone mets.AKI resolved.  Still with some degree of hypercalcemia.  He also had some GI symptoms including nausea, emesis and abdominal distention.  KUB on 1/19 raise concern for SBO but GI symptoms resolved the next day.   New events last 24 hours / Subjective: No new complaints today, BP soft in the 90s.   Assessment & Plan:   Active Problems:   Malnutrition ([facility])   Sepsis ([place])   Protein-calorie malnutrition, severe   Lower urinary tract infectious disease   Palliative care by specialist   Goals of care, counseling/discussion   Sepsis secondary to E. 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  Now with repeat blood culture showing E. 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 Jeffie Pollock -Follow-up with oncology in 2 weeks, Dr. Irene Limbo   Hypercalcemia of malignancy -Status post zoledronic acid -Resolved   AKI -Resolved  Severe protein calorie malnutrition -Estimated  body mass index is 16.9 kg/m as calculated from the following:   Height as of this encounter: 6\' 1"  (1.854 m).   Weight as of this encounter: 58.1 kg.  Hypokalemia -Replace, trend   DVT prophylaxis:  SCDs Start: 11/03/20 2329  Code Status: Full code Family Communication: No family at bedside Disposition Plan:  Status is: Inpatient  Remains inpatient appropriate because:Inpatient level of care appropriate due to severity of illness   Dispo: The patient is from: Home              Anticipated d/c is to: SNF              Anticipated d/c date is: > 3 days              Patient currently is not medically stable to d/c.  Remains on IV fluids and IV antibiotics today. Unable to deescalate antibiotics since ESBL bacteremia confirmed. Will need merrem at least 7 day tx    Difficult to place patient No   Consultants:   Urology  Oncology  Palliative care medicine   Antimicrobials:  Anti-infectives (From admission, onward)   Start     Dose/Rate Route Frequency Ordered Stop   11/23/20 1000  meropenem (MERREM) 1 g in sodium chloride 0.9 % 100 mL IVPB        1 g 200 mL/hr over 30 Minutes Intravenous Every 8 hours 11/23/20 0937     11/12/20 2000  ciprofloxacin (CIPRO) tablet 500 mg        500 mg Oral 2 times daily 11/12/20 1543 11/17/20 2359   11/10/20 0800  ciprofloxacin (CIPRO) tablet 500 mg  Status:  Discontinued  500 mg Oral Daily with breakfast 11/09/20 1357 11/12/20 1543   11/06/20 1200  ciprofloxacin (CIPRO) tablet 500 mg  Status:  Discontinued        500 mg Oral 2 times daily 11/06/20 1114 11/09/20 1357   11/05/20 1500  cefdinir (OMNICEF) capsule 300 mg  Status:  Discontinued        300 mg Oral Every 12 hours 11/05/20 1337 11/06/20 1114   11/03/20 2200  vancomycin (VANCOREADY) IVPB 500 mg/100 mL  Status:  Discontinued        500 mg 100 mL/hr over 60 Minutes Intravenous Every 12 hours 11/03/20 1402 11/05/20 1335   11/03/20 1415  piperacillin-tazobactam (ZOSYN) IVPB  3.375 g  Status:  Discontinued        3.375 g 12.5 mL/hr over 240 Minutes Intravenous Every 8 hours 11/03/20 1401 11/05/20 1337   11/03/20 0645  vancomycin (VANCOCIN) IVPB 1000 mg/200 mL premix        1,000 mg 200 mL/hr over 60 Minutes Intravenous  Once 11/03/20 0631 11/03/20 1147   11/03/20 0600  piperacillin-tazobactam (ZOSYN) IVPB 4.5 g  Status:  Discontinued        4.5 g 200 mL/hr over 30 Minutes Intravenous  Once 11/03/20 0546 11/03/20 0547   11/03/20 0600  piperacillin-tazobactam (ZOSYN) IVPB 3.375 g        3.375 g 100 mL/hr over 30 Minutes Intravenous To Emergency Dept 11/03/20 0548 11/03/20 0650       Objective: Vitals:   11/25/20 2336 11/26/20 0321 11/26/20 0926 11/26/20 1100  BP: (!) 91/57 (!) 92/52 (!) 98/58 (!) 95/57  Pulse: (!) 108 (!) 113 (!) 106 (!) 107  Resp: 18 20 16 16   Temp: 98.7 F (37.1 C) 98.3 F (36.8 C) 98 F (36.7 C) 98.6 F (37 C)  TempSrc: Oral Oral Oral Oral  SpO2: 96% 97% 97% 97%  Weight:      Height:        Intake/Output Summary (Last 24 hours) at 11/26/2020 1135 Last data filed at 11/26/2020 1000 Gross per 24 hour  Intake 1250.73 ml  Output 3225 ml  Net -1974.27 ml   Filed Weights   11/03/20 0852 11/23/20 1017 11/24/20 0508  Weight: 50.7 kg 59.9 kg 58.1 kg    Examination: General exam: Appears calm and comfortable  Respiratory system: Clear to auscultation. Respiratory effort normal. Cardiovascular system: S1 & S2 heard, tachycardic. No pedal edema. Gastrointestinal system: Abdomen is nondistended, soft and nontender. Normal bowel sounds heard. Central nervous system: Alert and oriented. Non focal exam. Speech clear  Extremities: Symmetric in appearance bilaterally  Skin: No rashes, lesions or ulcers on exposed skin  Psychiatry: Judgement and insight appear stable. Mood & affect appropriate.    Data Reviewed: I have personally reviewed following labs and imaging studies  CBC: Recent Labs  Lab 11/22/20 0516 11/23/20 0515  11/24/20 0507 11/25/20 0528 11/26/20 0638  WBC 25.2* 21.6* 18.1* 15.1* 14.1*  HGB 9.5* 9.0* 9.4* 8.2* 8.3*  HCT 30.8* 29.4* 29.6* 26.1* 27.3*  MCV 91.1 90.5 88.6 90.0 89.8  PLT 524* 420* 447* 396 [id number]*   Basic Metabolic Panel: Recent Labs  Lab 11/20/20 0537 11/22/20 0516 11/23/20 0515 11/24/20 0507 11/25/20 0528 11/26/20 0638  NA 136 139 136 136 136 137  K 3.7 3.0* 3.5 3.1* 2.7* 2.6*  CL 111 114* 112* 109 109 109  CO2 20* 17* 18* 19* 20* 19*  GLUCOSE 95 122* 105* 118* 102* 110*  BUN 10 15 12 12 14  10  CREATININE 0.84 0.85 0.86 0.91 0.81 0.73  CALCIUM 11.2* 10.6* 10.4* 11.0* 10.4* 10.2  MG 2.0 1.7 1.7  --   --  1.7  PHOS 2.0* 1.2* 2.1*  --   --   --    GFR: Estimated Creatinine Clearance: 75.7 mL/min (by C-G formula based on SCr of 0.73 mg/dL). Liver Function Tests: Recent Labs  Lab 11/20/20 0537 11/22/20 0516 11/23/20 0515  AST  --   --  16  ALT  --   --  12  ALKPHOS  --   --  94  BILITOT  --   --  0.3  PROT  --   --  6.3*  ALBUMIN 2.2* 2.1* 2.0*   No results for input(s): LIPASE, AMYLASE in the last 168 hours. No results for input(s): AMMONIA in the last 168 hours. Coagulation Profile: No results for input(s): INR, PROTIME in the last 168 hours. Cardiac Enzymes: No results for input(s): CKTOTAL, CKMB, CKMBINDEX, TROPONINI in the last 168 hours. BNP (last 3 results) No results for input(s): PROBNP in the last 8760 hours. HbA1C: No results for input(s): HGBA1C in the last 72 hours. CBG: Recent Labs  Lab 11/21/20 0512 11/22/20 0707 11/23/20 0558 11/25/20 0648 11/26/20 0411  GLUCAP 92 98 112* 118* 116*   Lipid Profile: No results for input(s): CHOL, HDL, LDLCALC, TRIG, CHOLHDL, LDLDIRECT in the last 72 hours. Thyroid Function Tests: No results for input(s): TSH, T4TOTAL, FREET4, T3FREE, THYROIDAB in the last 72 hours. Anemia Panel: No results for input(s): VITAMINB12, FOLATE, FERRITIN, TIBC, IRON, RETICCTPCT in the last 72 hours. Sepsis Labs: Recent  Labs  Lab 11/22/20 1459  LATICACIDVEN 1.3    Recent Results (from the past 240 hour(s))  Culture, blood (routine x 2)     Status: None (Preliminary result)   Collection Time: 11/22/20  2:59 PM   Specimen: BLOOD  Result Value Ref Range Status   Specimen Description   Final    BLOOD LEFT ANTECUBITAL Performed at [facility] [address] [place], [place] [address]    Special Requests   Final    BOTTLES DRAWN AEROBIC ONLY Blood Culture adequate volume Performed at [facility] [address], [place], [place] [address]    Culture  Setup Time   Final    GRAM NEGATIVE RODS AEROBIC BOTTLE ONLY CRITICAL VALUE NOTED.  VALUE IS CONSISTENT WITH PREVIOUSLY REPORTED AND CALLED VALUE.    Culture   Final    GRAM NEGATIVE RODS IDENTIFICATION TO FOLLOW Performed at [facility], [place] [address], [place], [place] [address]    Report Status PENDING  Incomplete  Culture, blood (routine x 2)     Status: Abnormal   Collection Time: 11/22/20  2:59 PM   Specimen: BLOOD LEFT HAND  Result Value Ref Range Status   Specimen Description   Final    BLOOD LEFT HAND Performed at [facility] [address] [place], [place] [address]    Special Requests   Final    BOTTLES DRAWN AEROBIC ONLY Blood Culture adequate volume Performed at [facility] [address], [place], [place] [address]    Culture  Setup Time   Final    GRAM NEGATIVE RODS AEROBIC BOTTLE ONLY CRITICAL RESULT CALLED TO, READ BACK BY AND VERIFIED WITH: Ulice Bold PharmD 9:25 11/23/20 ([email or web address]) Performed at [facility], [place] [address], [place],  [address]    Culture (A)  Final  ESCHERICHIA COLI Confirmed Extended Spectrum Beta-Lactamase Producer (ESBL).  In bloodstream infections from ESBL organisms, carbapenems are preferred over piperacillin/tazobactam. They are shown to have a lower risk of mortality.    Report Status 11/26/2020  FINAL  Final   Organism ID, Bacteria ESCHERICHIA COLI  Final      Susceptibility   Escherichia coli - MIC*    AMPICILLIN >=32 RESISTANT Resistant     CEFAZOLIN >=64 RESISTANT Resistant     CEFEPIME >=32 RESISTANT Resistant     CEFTAZIDIME 32 RESISTANT Resistant     CEFTRIAXONE >=64 RESISTANT Resistant     CIPROFLOXACIN >=4 RESISTANT Resistant     GENTAMICIN <=1 SENSITIVE Sensitive     IMIPENEM <=0.25 SENSITIVE Sensitive     TRIMETH/SULFA <=20 SENSITIVE Sensitive     AMPICILLIN/SULBACTAM >=32 RESISTANT Resistant     * ESCHERICHIA COLI  Blood Culture ID Panel (Reflexed)     Status: Abnormal   Collection Time: 11/22/20  2:59 PM  Result Value Ref Range Status   Enterococcus faecalis NOT DETECTED NOT DETECTED Final   Enterococcus Faecium NOT DETECTED NOT DETECTED Final   Listeria monocytogenes NOT DETECTED NOT DETECTED Final   Staphylococcus species NOT DETECTED NOT DETECTED Final   Staphylococcus aureus (BCID) NOT DETECTED NOT DETECTED Final   Staphylococcus epidermidis NOT DETECTED NOT DETECTED Final   Staphylococcus lugdunensis NOT DETECTED NOT DETECTED Final   Streptococcus species NOT DETECTED NOT DETECTED Final   Streptococcus agalactiae NOT DETECTED NOT DETECTED Final   Streptococcus pneumoniae NOT DETECTED NOT DETECTED Final   Streptococcus pyogenes NOT DETECTED NOT DETECTED Final   A.calcoaceticus-baumannii NOT DETECTED NOT DETECTED Final   Bacteroides fragilis NOT DETECTED NOT DETECTED Final   Enterobacterales DETECTED (A) NOT DETECTED Final    Comment: Enterobacterales represent a large order of gram negative bacteria, not a single organism. CRITICAL RESULT CALLED TO, READ BACK BY AND VERIFIED WITH: Ulice Bold PharmD 9:25 11/23/20 ([email or web address])    Enterobacter cloacae complex NOT DETECTED NOT DETECTED Final   Escherichia coli DETECTED (A) NOT DETECTED Final    Comment: CRITICAL RESULT CALLED TO, READ BACK BY AND VERIFIED WITH: Ulice Bold PharmD 9:25 11/23/20 ([email or web address])     Klebsiella aerogenes NOT DETECTED NOT DETECTED Final   Klebsiella oxytoca NOT DETECTED NOT DETECTED Final   Klebsiella pneumoniae NOT DETECTED NOT DETECTED Final   Proteus species NOT DETECTED NOT DETECTED Final   Salmonella species NOT DETECTED NOT DETECTED Final   Serratia marcescens NOT DETECTED NOT DETECTED Final   Haemophilus influenzae NOT DETECTED NOT DETECTED Final   Neisseria meningitidis NOT DETECTED NOT DETECTED Final   Pseudomonas aeruginosa NOT DETECTED NOT DETECTED Final   Stenotrophomonas maltophilia NOT DETECTED NOT DETECTED Final   Candida albicans NOT DETECTED NOT DETECTED Final   Candida auris NOT DETECTED NOT DETECTED Final   Candida glabrata NOT DETECTED NOT DETECTED Final   Candida krusei NOT DETECTED NOT DETECTED Final   Candida parapsilosis NOT DETECTED NOT DETECTED Final   Candida tropicalis NOT DETECTED NOT DETECTED Final   Cryptococcus neoformans/gattii NOT DETECTED NOT DETECTED Final   CTX-M ESBL DETECTED (A) NOT DETECTED Final    Comment: CRITICAL RESULT CALLED TO, READ BACK BY AND VERIFIED WITH: Ulice Bold PharmD 9:25 11/23/20 ([email or web address]) (NOTE) Extended spectrum beta-lactamase detected. Recommend a carbapenem as initial therapy.      Carbapenem resistance IMP NOT DETECTED NOT DETECTED Final   Carbapenem resistance KPC NOT DETECTED NOT DETECTED Final   Carbapenem resistance NDM NOT DETECTED NOT DETECTED Final  Carbapenem resist OXA 48 LIKE NOT DETECTED NOT DETECTED Final   Carbapenem resistance VIM NOT DETECTED NOT DETECTED Final    Comment: Performed at [facility], [place] [address], [place], [place] [address]      Radiology Studies: No results found.    Scheduled Meds: . (feeding supplement) PROSource Plus  30 mL Oral BID WC  . B-complex with vitamin C  1 tablet Oral Daily  . Chlorhexidine Gluconate Cloth  6 each Topical Daily  . feeding supplement  1 Container Oral BID WC  . feeding supplement  237 mL Oral TID BM  . folic acid  1 mg Oral  Daily  . hydrocerin   Topical BID  . nicotine  14 mg Transdermal Daily  . senna-docusate  1 tablet Oral BID  . tamsulosin  0.4 mg Oral Nightly  . cyanocobalamin  1,000 mcg Oral Daily   Continuous Infusions: . 0.9 % NaCl with KCl 40 mEq / L 75 mL/hr at 11/26/20 1107  . meropenem (MERREM) IV 1 g (11/26/20 1107)     LOS: 23 days      Time spent: 25 minutes   Dessa Phi, DO [facility] 11/26/2020, 11:35 AM   Available via Epic secure chat 7am-7pm After these hours, please refer to coverage provider listed on [email or web address]

## 2020-11-26 NOTE — Progress Notes (Signed)
Patient yellow MEWS d/t B/P and HR. No changes from previous assessment. Alert, watching TV in bed.

## 2020-11-26 NOTE — Progress Notes (Signed)
MD paged regarding BP. Awaiting new orders.

## 2020-11-26 NOTE — Progress Notes (Signed)
Critical value reported to MD Maylene Roes. K is 2.6. Awaiting new orders.

## 2020-11-27 DIAGNOSIS — A4151 Sepsis due to Escherichia coli [E. coli]: Secondary | ICD-10-CM | POA: Diagnosis not present

## 2020-11-27 DIAGNOSIS — A499 Bacterial infection, unspecified: Secondary | ICD-10-CM

## 2020-11-27 LAB — CBC
HCT: 25.8 % — ABNORMAL LOW (ref 39.0–52.0)
Hemoglobin: 7.8 g/dL — ABNORMAL LOW (ref 13.0–17.0)
MCH: 27.8 pg (ref 26.0–34.0)
MCHC: 30.2 g/dL (ref 30.0–36.0)
MCV: 91.8 fL (ref 80.0–100.0)
Platelets: 434 10*3/uL — ABNORMAL HIGH (ref 150–400)
RBC: 2.81 MIL/uL — ABNORMAL LOW (ref 4.22–5.81)
RDW: 15.8 % — ABNORMAL HIGH (ref 11.5–15.5)
WBC: 18.3 10*3/uL — ABNORMAL HIGH (ref 4.0–10.5)
nRBC: 0 % (ref 0.0–0.2)

## 2020-11-27 LAB — BASIC METABOLIC PANEL
Anion gap: 6 (ref 5–15)
BUN: 11 mg/dL (ref 8–23)
CO2: 20 mmol/L — ABNORMAL LOW (ref 22–32)
Calcium: 10.8 mg/dL — ABNORMAL HIGH (ref 8.9–10.3)
Chloride: 111 mmol/L (ref 98–111)
Creatinine, Ser: 0.73 mg/dL (ref 0.61–1.24)
GFR, Estimated: >60 mL/min (ref >60)
Glucose, Bld: 95 mg/dL (ref 70–99)
Potassium: 3.1 mmol/L — ABNORMAL LOW (ref 3.5–5.1)
Sodium: 137 mmol/L (ref 135–145)

## 2020-11-27 LAB — MAGNESIUM: Magnesium: 1.7 mg/dL (ref 1.7–2.4)

## 2020-11-27 MED ORDER — MAGNESIUM SULFATE 2 GM/50ML IV SOLN
2.0000 g | Freq: Once | INTRAVENOUS | Status: AC
  Administered 2020-11-27: 10:00:00 2 g via INTRAVENOUS
  Filled 2020-11-27: qty 50

## 2020-11-27 MED ORDER — POTASSIUM CHLORIDE CRYS ER 20 MEQ PO TBCR
40.0000 meq | EXTENDED_RELEASE_TABLET | Freq: Once | ORAL | Status: AC
  Administered 2020-11-27: 10:00:00 40 meq via ORAL
  Filled 2020-11-27: qty 2

## 2020-11-27 NOTE — Progress Notes (Incomplete)
HEMATOLOGY/ONCOLOGY CONSULTATION NOTE  Date of Service: 11/27/2020  Patient Care Team: Patient, [name] as PCP - General (General Practice) Freada Bergeron, MD as PCP - Cardiology (Cardiology)  CHIEF COMPLAINTS/PURPOSE OF CONSULTATION:  Bladder Cancer  HISTORY OF PRESENTING ILLNESS:  I connected with  Clance Boll on 11/27/20 by telephone and verified that I am speaking with the correct person using two identifiers.   I discussed the limitations of evaluation and management by telemedicine. The patient expressed understanding and agreed to proceed.  Other persons participating in the visit and their role in the encounter:        -Yevette Edwards, [place], Medical Scribe  Patient's location: Home Provider's location: [place] [facility] [place] is a wonderful 65 y.o. male who is here after being seen in the hospital for evaluation and management of bladder cancer.  The pt reports that he feels that his kidney stones have resolved. He continues to report white mucous in his catheter that is about the same volume as before. He has started moving his bowels again. Pt is having difficulty walking due to severe Gout which has left him bedbound. Pt is having discomfort in many of his toes. This flare started upon his hospital discharge. He is scheduled for his first visit with a PCP on 11/07/2020. The pt is taking a significant amount of Tylenol to control his lower abdominal pain. His appetite has been lacking and he has lost weight since hospitalization. He also does not appear to have access to adequate nutrition.   Of note prior to the patient's visit today, pt has had CT Abd/Pel ([id number]) completed on 10/08/2020 with results revealing "1. Irregular thickening of the bladder wall with enhancement. Gas and nonenhancing material centrally within the bladder. Bladder neoplasm is certainly a consideration. 2. Bilateral nephrolithiasis. No ureterolithiasis  or obstructive uropathy. 3. 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 4. Aortic Atherosclerosis (ICD10-I70.0)."  Pt has had CT Chest ([id number]) completed on 10/11/2020 with results revealing "1. Borderline enlarged mediastinal and hilar lymph nodes. These are likely related to the patient has emphysema but attention on future studies or PET-CT may be helpful for further evaluation. 2. No findings for pulmonary metastatic disease. 3. Emphysematous changes and pulmonary scarring. 4. Debris in the trachea, likely mucous. 5. Stable hepatic and left renal cysts. 6. Emphysema and aortic atherosclerosis."  On review of systems, pt reports lower abdominal discomfort, night sweats, weight loss, low appetite, SOB and denies hematuria, fevers, chills, constipation and any other symptoms.   On PMHx the pt reports Transurethral Resection of Bladder Tumor, Colostomy.  Interval History Zeev Deakins is a wonderful 65 y.o. male here today for evaluation and management of bladder cancer.The patient's last visit with [place] was on 11/01/2020 via phone call. The pt reports that he is doing well overall.  The pt reports ***  Lab results today 11/28/2020 of CBC w/diff and CMP is as follows: all values are WNL except for ***  On review of systems, pt reports *** and denies *** and any other symptoms.   MEDICAL HISTORY:  Past Medical History:  Diagnosis Date  . Bladder cancer ([place])   . Colostomy care ([place])   . Toe infection     SURGICAL HISTORY: Past Surgical History:  Procedure Laterality Date  . ABDOMINAL SURGERY    . CYSTOSCOPY/URETEROSCOPY/HOLMIUM LASER/STENT PLACEMENT Right 10/06/2020   Procedure: CYSTO;  Surgeon: Irine Seal, MD;  Location:  [facility];  Service: Urology;  Laterality: Right;  . TRANSURETHRAL RESECTION OF BLADDER TUMOR N/A 10/06/2020   Procedure: POSSIBLE TRANSURETHRAL RESECTION OF BLADDER TUMOR (TURBT);  Surgeon: Bjorn Pippin, MD;  Location: [facility];  Service: Urology;  Laterality: N/A;     SOCIAL HISTORY: Social History   Socioeconomic History  . Marital status: Divorced    Spouse name: Not on file  . Number of children: Not on file  . Years of education: Not on file  . Highest education level: Not on file  Occupational History  . Not on file  Tobacco Use  . Smoking status: Current Some Day Smoker  . Smokeless tobacco: Never Used  Substance and Sexual Activity  . Alcohol use: Yes  . Drug use: Yes    Types: Marijuana  . Sexual activity: Not on file  Other Topics Concern  . Not on file  Social History Narrative  . Not on file   Social Determinants of Health   Financial Resource Strain: Not on file  Food Insecurity: Not on file  Transportation Needs: Not on file  Physical Activity: Not on file  Stress: Not on file  Social Connections: Not on file  Intimate Partner Violence: Not on file    FAMILY HISTORY: Family History  Problem Relation Age of Onset  . CAD Neg Hx   . Heart failure Neg Hx     ALLERGIES:  is allergic to silvadene [silver sulfadiazine].  MEDICATIONS:  No current facility-administered medications for this visit.   No current outpatient medications on file.   Facility-Administered Medications Ordered in Other Visits  Medication Dose Route Frequency Provider Last Rate Last Admin  . (feeding supplement) PROSource Plus liquid 30 mL  30 mL Oral BID WC Narda Bonds, MD   30 mL at 11/27/20 0815  . 0.9 % NaCl with KCl 40 mEq / L  infusion   Intravenous Continuous Noralee Stain, DO 75 mL/hr at 11/27/20 0313 New Bag at 11/27/20 0313  . acetaminophen (TYLENOL) 160 MG/5ML solution 650 mg  650 mg Oral Q6H PRN Almon Hercules, MD   650 mg at 11/22/20 0126  . alum & mag hydroxide-simeth (MAALOX/MYLANTA) 200-200-20 MG/5ML suspension 30 mL  30 mL Oral Q6H PRN Candelaria Stagers T, MD   30 mL at 11/20/20 1853  . B-complex with vitamin C tablet 1 tablet  1 tablet Oral Daily Kyle, Tyrone A, DO   1 tablet at 11/26/20 0848  . Chlorhexidine Gluconate Cloth 2  % PADS 6 each  6 each Topical Daily Narda Bonds, MD   6 each at 11/26/20 [phone]  . feeding supplement (BOOST / RESOURCE BREEZE) liquid 1 Container  1 Container Oral BID WC Noralee Stain, DO   1 Container at 11/27/20 [phone]  . feeding supplement (ENSURE ENLIVE / ENSURE PLUS) liquid 237 mL  237 mL Oral TID BM Narda Bonds, MD   237 mL at 11/26/20 1957  . folic acid (FOLVITE) tablet 1 mg  1 mg Oral Daily Kyle, Tyrone A, DO   1 mg at 11/26/20 0849  . hydrocerin (EUCERIN) cream   Topical BID Narda Bonds, MD   Given at 11/26/20 2130  . hydrOXYzine (ATARAX) 10 MG/5ML syrup 50 mg  50 mg Oral TID PRN Audrea Muscat T, NP   50 mg at 11/07/20 0953  . ketorolac (TORADOL) 15 MG/ML injection 15 mg  15 mg Intravenous Q6H PRN Noralee Stain, DO   15 mg at 11/26/20 1716  . magnesium  sulfate IVPB 2 g 50 mL  2 g Intravenous Once Dessa Phi, DO      . meropenem (MERREM) 1 g in sodium chloride 0.9 % 100 mL IVPB  1 g Intravenous Q8H BellSharyn Lull T, RPH 200 mL/hr at 11/27/20 0313 1 g at 11/27/20 0313  . nicotine (NICODERM CQ - dosed in mg/24 hours) patch 14 mg  14 mg Transdermal Daily Basilio Cairo, NP   14 mg at 11/26/20 0851  . ondansetron (ZOFRAN) tablet 4 mg  4 mg Oral Q6H PRN Marylyn Ishihara, Tyrone A, DO       Or  . ondansetron (ZOFRAN) injection 4 mg  4 mg Intravenous Q6H PRN Marylyn Ishihara, Tyrone A, DO      . oxyCODONE (ROXICODONE) 5 MG/5ML solution 5-7.5 mg  5-7.5 mg Oral Q4H PRN Dessa Phi, DO   5 mg at 11/27/20 9702  . polyethylene glycol (MIRALAX / GLYCOLAX) packet 17 g  17 g Oral Daily PRN Gonfa, Taye T, MD      . potassium chloride SA (KLOR-CON) CR tablet 40 mEq  40 mEq Oral Once Dessa Phi, DO      . senna-docusate (Senokot-S) tablet 1 tablet  1 tablet Oral BID Hollace Hayward K, NP   1 tablet at 11/25/20 1007  . sodium chloride flush (NS) 0.9 % injection 10-40 mL  10-40 mL Intracatheter PRN Tegeler, Gwenyth Allegra, MD      . sodium chloride flush (NS) 0.9 % injection 10-40 mL  10-40 mL Intracatheter PRN  Mariel Aloe, MD      . tamsulosin [facility]) capsule 0.4 mg  0.4 mg Oral Nightly Hollace Hayward K, NP   0.4 mg at 11/26/20 2113  . vitamin B-12 (CYANOCOBALAMIN) tablet 1,000 mcg  1,000 mcg Oral Daily Kyle, Tyrone A, DO   1,000 mcg at 11/26/20 6378    REVIEW OF SYSTEMS:   10 Point review of Systems was done is negative except as noted above.  PHYSICAL EXAMINATION: ECOG PERFORMANCE STATUS: 4 - Bedbound  .There were no vitals filed for this visit. There were no vitals filed for this visit. .There is no height or weight on file to calculate BMI.   *** GENERAL:alert, in no acute distress and comfortable SKIN: no acute rashes, no significant lesions EYES: conjunctiva are pink and non-injected, sclera anicteric OROPHARYNX: MMM, no exudates, no oropharyngeal erythema or ulceration NECK: supple, no JVD LYMPH:  no palpable lymphadenopathy in the cervical, axillary or inguinal regions LUNGS: clear to auscultation b/l with normal respiratory effort HEART: regular rate & rhythm ABDOMEN:  normoactive bowel sounds , non tender, not distended. Extremity: no pedal edema PSYCH: alert & oriented x 3 with fluent speech NEURO: no focal motor/sensory deficits  LABORATORY DATA:  I have reviewed the data as listed  . CBC Latest Ref Rng & Units 11/27/2020 11/26/2020 11/25/2020  WBC 4.0 - 10.5 K/uL 18.3(H) 14.1(H) 15.1(H)  Hemoglobin 13.0 - 17.0 g/dL 7.8(L) 8.3(L) 8.2(L)  Hematocrit 39.0 - 52.0 % 25.8(L) 27.3(L) 26.1(L)  Platelets 150 - 400 K/uL 434(H) 429(H) 396    . CMP Latest Ref Rng & Units 11/27/2020 11/26/2020 11/25/2020  Glucose 70 - 99 mg/dL 95 110(H) 102(H)  BUN 8 - 23 mg/dL $Remove'11 10 14  'LWlMmKR$ Creatinine 0.61 - 1.24 mg/dL 0.73 0.73 0.81  Sodium 135 - 145 mmol/L 137 137 136  Potassium 3.5 - 5.1 mmol/L 3.1(L) 2.6(LL) 2.7(LL)  Chloride 98 - 111 mmol/L 111 109 109  CO2 22 - 32 mmol/L 20(L) 19(L) 20(L)  Calcium 8.9 - 10.3 mg/dL 10.8(H) 10.2 10.4(H)  Total Protein 6.5 - 8.1 g/dL - - -  Total Bilirubin  0.3 - 1.2 mg/dL - - -  Alkaline Phos 38 - 126 U/L - - -  AST 15 - 41 U/L - - -  ALT 0 - 44 U/L - - -     RADIOGRAPHIC STUDIES: I have personally reviewed the radiological images as listed and agreed with the findings in the report. CT ABDOMEN PELVIS W CONTRAST  Result Date: 11/03/2020 CLINICAL DATA:  Abdominal pain.  Bladder cancer. EXAM: CT ABDOMEN AND PELVIS WITH CONTRAST TECHNIQUE: Multidetector CT imaging of the abdomen and pelvis was performed using the standard protocol following bolus administration of intravenous contrast. CONTRAST:  16mL OMNIPAQUE IOHEXOL 300 MG/ML  SOLN COMPARISON:  10/08/2020 FINDINGS: Lower chest:  No acute finding Hepatobiliary: Multiple hepatic cysts measuring up to 5.4 cm in the right upper lobe.No evidence of biliary obstruction or stone. Pancreas: Unremarkable. Spleen: Unremarkable. Adrenals/Urinary Tract: Negative adrenals. No hydronephrosis or ureteral stone. Multiple renal calculi, more numerous on the right where the largest stone measures 14 mm. Right renal cortical scarring asymmetric to the right. Bulky necrotic tumor at the dome of the bladder with high-density in the bladder lumen likely reflecting both clot and calcification. The confluent mass measures at least 9 cm craniocaudal. Foley catheter is in expected position. The mass is intimately associated with small bowel loops in the pelvis, without bowel obstruction or demonstrable fistulization. Stomach/Bowel: Descending colostomy. No bowel obstruction or visible inflammation. Vascular/Lymphatic: No acute vascular abnormality. Atherosclerosis of the aorta and iliacs. There are a few pelvic lymph nodes which show prominent enhancement but are strictly nonenlarged. An 8 mm node along the right iliac chain on 2:66 is an example. No para-aortic adenopathy is seen. Reproductive:Unremarkable Other: No ascites or pneumoperitoneum. Musculoskeletal: No acute abnormalities. IMPRESSION: 1. Bulky bladder mass with  progression from 10/08/2020. The mass appears necrotic with complex material in the bladder lumen. Foley catheter is normally positioned. 2. The mass is intimately associated with pelvic small bowel loops. Fistulization is possible, especially given the degree of bladder gas, but not clearly demonstrated. Please correlate with Foley catheter output. No small bowel obstruction. 3. Possible early nodal metastatic disease in the pelvis. 4. Right renal atrophy and multiple calculi. Electronically Signed   By: Monte Fantasia M.D.   On: 11/03/2020 07:57   DG Chest Port 1 View  Result Date: 11/03/2020 CLINICAL DATA:  Bladder pain and questionable sepsis. EXAM: PORTABLE CHEST 1 VIEW COMPARISON:  October 10, 2020 FINDINGS: Very mild, stable biapical atelectasis and scarring is seen. There is no evidence of acute infiltrate, pleural effusion or pneumothorax. The heart size and mediastinal contours are within normal limits. The visualized skeletal structures are unremarkable. IMPRESSION: Stable exam without acute or active cardiopulmonary disease. Electronically Signed   By: Virgina Norfolk M.D.   On: 11/03/2020 00:12   DG Abd Portable 1V  Result Date: 11/22/2020 CLINICAL DATA:  Urinary retention EXAM: PORTABLE ABDOMEN - 1 VIEW COMPARISON:  11/16/2020 FINDINGS: Previously noted dilated gas-filled loops of bowel appear improved in caliber compatible with a resolving ileus or small bowel obstruction. No gross free intraperitoneal gas. Soft tissue opacity displaces bowel from the pelvis likely representing the known bladder mass. Ventral or inguinal hernia repair with mesh has been performed. IMPRESSION: Improving small-bowel obstruction or ileus. Electronically Signed   By: Fidela Salisbury MD   On: 11/22/2020 01:59   DG Abd Portable 1V  Result  Date: 11/16/2020 CLINICAL DATA:  Emesis EXAM: PORTABLE ABDOMEN - 1 VIEW COMPARISON:  10/11/2020, CT 11/03/2020 FINDINGS: Interval moderate gaseous dilatation of stomach.  Multiple dilated small bowel loops measuring up to 5.2 cm with relative paucity of distal gas concerning for a bowel obstruction. IMPRESSION: Interval moderate gaseous dilatation of stomach. Multiple dilated small bowel loops in a pattern concerning for a bowel obstruction. Electronically Signed   By: Jasmine Pang M.D.   On: 11/16/2020 19:43   DG Bone Survey Met  Result Date: 11/11/2020 CLINICAL DATA:  Bladder carcinoma EXAM: METASTATIC BONE SURVEY COMPARISON:  CT abdomen and pelvis November 03, 2020; chest radiograph November 03, 2020 FINDINGS: Skull: No blastic or lytic bone lesions.  Sella appears normal. Cervical spine: No blastic or lytic bone lesions. Foci of degenerative type change noted. Thoracic spine: No blastic or lytic bone lesions. Chest: Scarring lateral right base. Lungs otherwise clear. Heart size normal. No blastic or lytic bone lesions. Lumbar spine: No blastic or lytic bone lesions. Pelvis: No blastic or lytic bone lesions evident. Right femur: No blastic or lytic bone lesions. No abnormal periosteal reaction. Left femur: No blastic or lytic bone lesions. No abnormal periosteal reaction. Right tibia and fibula: No blastic or lytic bone lesions. No abnormal periosteal reaction. Left tibia and fibula: No blastic or lytic bone lesions. No abnormal periosteal reaction. Right shoulder and humerus: No blastic or lytic bone lesions. No abnormal periosteal reaction. Left shoulder and humerus: No blastic or lytic bone lesions. No abnormal periosteal reaction. Right forearm: No blastic or lytic bone lesions. No abnormal periosteal reaction. Left forearm: No blastic or lytic bone lesions. No abnormal periosteal reaction. IMPRESSION: No blastic or lytic bone lesions. No findings suggesting bony metastatic disease. Electronically Signed   By: Bretta Bang III M.D.   On: 11/11/2020 11:54   DG Foot 2 Views Left  Result Date: 11/05/2020 CLINICAL DATA:  Pain.  No injury.  Possible gout. EXAM: LEFT FOOT -  2 VIEW COMPARISON:  None. FINDINGS: There is no evidence of fracture or dislocation. There is no evidence of arthropathy or other focal bone abnormality. Soft tissues are unremarkable. IMPRESSION: Negative. Electronically Signed   By: Gerome Sam III M.D   On: 11/05/2020 10:36   DG Foot 2 Views Right  Result Date: 11/05/2020 CLINICAL DATA:  Pain.  No trauma. EXAM: RIGHT FOOT - 2 VIEW COMPARISON:  None. FINDINGS: There is no evidence of fracture or dislocation. There is no evidence of arthropathy or other focal bone abnormality. Soft tissues are unremarkable. IMPRESSION: Negative. Electronically Signed   By: Gerome Sam III M.D   On: 11/05/2020 10:37    ASSESSMENT & PLAN:   65 yo with   1) newly diagnosed muscle invasive bladder cancer tx,nx,Mx , poorly , multiple morphologies. 2) ?mediastinal/hilar LN - borderline ? Mets 3) poor functional status ECOG PS 3 4) Severe protein calorie malnutrition 5) Poor social support system-- was incarcerated for a long time and currently lives in halfway house. 6) Recurent UTI's-- recently with hematuria and sepsis.  PLAN: -Discussed pt labwork today, 11/28/2020; *** -Discussed ***  -Currently patient is not a good candidate for neoadjuvant cisplatin based chemotherapy. Will need to have urology weigh in regarding candidacy for cystectomy followed by adjuvant rx options. Alternative might need to consider chemo RT or palliative RT alone. -Will see back in ***   FOLLOW UP: ***   All of the patients questions were answered with apparent satisfaction. The patient knows to call the  clinic with any problems, questions or concerns.   The total time spent in the appointment was *** minutes and more than 50% was on counseling and direct patient cares.   Sullivan Lone MD [place] AAHIVMS [facility] Hematology/Oncology Physician [facility]  (Office):       [phone] (Work cell):  [phone] (Fax):           [phone]  11/27/2020  10:11 AM  I, Reinaldo Raddle, am acting as scribe for Dr. Sullivan Lone, MD.

## 2020-11-27 NOTE — Progress Notes (Signed)
PROGRESS NOTE    Jamie Wilson  RSW:[id number] DOB: [birth year] DOA: 11/02/2020 PCP: Patient, [name]     Brief Narrative:  Jamie Wilson is a 65 y.o.malewith medical history significant ofbladder CA w/ chronic foley placement; bowel obstruction s/p partial bowel resection w/ colostomy. Patient presented secondary to abdominal pain and found to have evidence of sepsis possibly secondary to necrotic bladder mass. Empiric antibiotics initiated. Urology and oncology consulted and recommendations given. Hospital course complicated by hypercalcemia. Bone scan does not show any lytic or blastic bone lesions suspicious for bone mets.AKI resolved.  Still with some degree of hypercalcemia.  He also had some GI symptoms including nausea, emesis and abdominal distention.  KUB on 1/19 raise concern for SBO but GI symptoms resolved the next day.   New events last 24 hours / Subjective: Had low BP yesterday afternoon, improved after IVF bolus. Asking about getting his foley catheter flushed today. No physical complaints.   Assessment & Plan:   Active Problems:   Malnutrition ([place])   Sepsis ([place])   Protein-calorie malnutrition, severe   Lower urinary tract infectious disease   Palliative care by specialist   Goals of care, counseling/discussion   Sepsis secondary to E. 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  Now with repeat blood culture showing E. 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 Jeffie Pollock -Follow-up with oncology in 2 weeks, Dr. Irene Limbo   Hypercalcemia of malignancy -Status post zoledronic  acid -Resolved   AKI -Resolved  Severe protein calorie malnutrition -Estimated body mass index is 16.9 kg/m as calculated from the following:   Height as of this encounter: 6\' 1"  (1.854 m).   Weight as of this encounter: 58.1 kg.  Hypokalemia -Replace, trend   DVT prophylaxis:  SCDs Start: 11/03/20 2329  Code Status: Full code Family Communication: No family at bedside Disposition Plan:  Status is: Inpatient  Remains inpatient appropriate because:Inpatient level of care appropriate due to severity of illness   Dispo: The patient is from: Home              Anticipated d/c is to: SNF              Anticipated d/c date is: 2 days              Patient currently is not medically stable to d/c.  Remains on IV antibiotics today. Unable to deescalate antibiotics since ESBL bacteremia confirmed. Will need merrem at least 7 day tx    Difficult to place patient No   Consultants:   Urology  Oncology  Palliative care medicine   Antimicrobials:  Anti-infectives (From admission, onward)   Start     Dose/Rate Route Frequency Ordered Stop   11/23/20 1000  meropenem (MERREM) 1 g in sodium chloride 0.9 % 100 mL IVPB        1 g 200 mL/hr over 30 Minutes Intravenous Every 8 hours 11/23/20 0937     11/12/20 2000  ciprofloxacin (CIPRO) tablet 500 mg        500 mg Oral 2 times daily 11/12/20 1543 11/17/20 2359   11/10/20 0800  ciprofloxacin (CIPRO) tablet 500 mg  Status:  Discontinued        500 mg Oral Daily with breakfast 11/09/20 1357 11/12/20 1543   11/06/20 1200  ciprofloxacin (CIPRO) tablet 500 mg  Status:  Discontinued        500 mg Oral 2 times daily 11/06/20 1114 11/09/20 1357   11/05/20 1500  cefdinir (OMNICEF) capsule 300 mg  Status:  Discontinued        300 mg Oral Every 12 hours 11/05/20 1337 11/06/20 1114   11/03/20 2200  vancomycin (VANCOREADY) IVPB 500 mg/100 mL  Status:  Discontinued        500 mg 100 mL/hr over 60 Minutes Intravenous Every 12 hours 11/03/20 1402  11/05/20 1335   11/03/20 1415  piperacillin-tazobactam (ZOSYN) IVPB 3.375 g  Status:  Discontinued        3.375 g 12.5 mL/hr over 240 Minutes Intravenous Every 8 hours 11/03/20 1401 11/05/20 1337   11/03/20 0645  vancomycin (VANCOCIN) IVPB 1000 mg/200 mL premix        1,000 mg 200 mL/hr over 60 Minutes Intravenous  Once 11/03/20 0631 11/03/20 1147   11/03/20 0600  piperacillin-tazobactam (ZOSYN) IVPB 4.5 g  Status:  Discontinued        4.5 g 200 mL/hr over 30 Minutes Intravenous  Once 11/03/20 0546 11/03/20 0547   11/03/20 0600  piperacillin-tazobactam (ZOSYN) IVPB 3.375 g        3.375 g 100 mL/hr over 30 Minutes Intravenous To Emergency Dept 11/03/20 0548 11/03/20 0650       Objective: Vitals:   11/26/20 2016 11/27/20 0246 11/27/20 0602 11/27/20 1008  BP: (!) 92/56 (!) 99/57 (!) 102/50 (!) 102/59  Pulse: 91 98 (!) 103 (!) 106  Resp: 16 18 18 16   Temp: 98 F (36.7 C) 98 F (36.7 C) 97.7 F (36.5 C) 98 F (36.7 C)  TempSrc: Oral Oral Oral Oral  SpO2: 98% 95% 95% 95%  Weight:      Height:        Intake/Output Summary (Last 24 hours) at 11/27/2020 1030 Last data filed at 11/27/2020 1000 Gross per 24 hour  Intake 1396.25 ml  Output 4300 ml  Net -2903.75 ml   Filed Weights   11/03/20 0852 11/23/20 1017 11/24/20 0508  Weight: 50.7 kg 59.9 kg 58.1 kg    Examination: General exam: Appears calm and comfortable  Respiratory system: Clear to auscultation. Respiratory effort normal. Cardiovascular system: S1 & S2 heard, RRR. No pedal edema. Gastrointestinal system: Abdomen is nondistended, soft and nontender. Normal bowel sounds heard. Central nervous system: Alert and oriented. Non focal exam. Speech clear  Extremities: Symmetric in appearance bilaterally  Skin: No rashes, lesions or ulcers on exposed skin  Psychiatry: Judgement and insight appear stable. Mood & affect appropriate.    Data Reviewed: I have personally reviewed following labs and imaging  studies  CBC: Recent Labs  Lab 11/23/20 0515 11/24/20 0507 11/25/20 0528 11/26/20 0638 11/27/20 0603  WBC 21.6* 18.1* 15.1* 14.1* 18.3*  HGB 9.0* 9.4* 8.2* 8.3* 7.8*  HCT 29.4* 29.6* 26.1* 27.3* 25.8*  MCV 90.5 88.6 90.0 89.8 91.8  PLT 420* 447* 396 429* [id number]*   Basic Metabolic Panel: Recent Labs  Lab 11/22/20 0516 11/23/20 0515 11/24/20 0507 11/25/20 0528 11/26/20 0638 11/27/20 0603  NA 139 136 136 136 137 137  K 3.0* 3.5 3.1* 2.7* 2.6* 3.1*  CL 114* 112* 109 109 109 111  CO2 17* 18* 19* 20* 19* 20*  GLUCOSE 122* 105* 118* 102* 110* 95  BUN 15  12 12 14 10 11   CREATININE 0.85 0.86 0.91 0.81 0.73 0.73  CALCIUM 10.6* 10.4* 11.0* 10.4* 10.2 10.8*  MG 1.7 1.7  --   --  1.7 1.7  PHOS 1.2* 2.1*  --   --   --   --    GFR: Estimated Creatinine Clearance: 75.7 mL/min (by C-G formula based on SCr of 0.73 mg/dL). Liver Function Tests: Recent Labs  Lab 11/22/20 0516 11/23/20 0515  AST  --  16  ALT  --  12  ALKPHOS  --  94  BILITOT  --  0.3  PROT  --  6.3*  ALBUMIN 2.1* 2.0*   No results for input(s): LIPASE, AMYLASE in the last 168 hours. No results for input(s): AMMONIA in the last 168 hours. Coagulation Profile: No results for input(s): INR, PROTIME in the last 168 hours. Cardiac Enzymes: No results for input(s): CKTOTAL, CKMB, CKMBINDEX, TROPONINI in the last 168 hours. BNP (last 3 results) No results for input(s): PROBNP in the last 8760 hours. HbA1C: No results for input(s): HGBA1C in the last 72 hours. CBG: Recent Labs  Lab 11/21/20 0512 11/22/20 0707 11/23/20 0558 11/25/20 0648 11/26/20 0411  GLUCAP 92 98 112* 118* 116*   Lipid Profile: No results for input(s): CHOL, HDL, LDLCALC, TRIG, CHOLHDL, LDLDIRECT in the last 72 hours. Thyroid Function Tests: No results for input(s): TSH, T4TOTAL, FREET4, T3FREE, THYROIDAB in the last 72 hours. Anemia Panel: No results for input(s): VITAMINB12, FOLATE, FERRITIN, TIBC, IRON, RETICCTPCT in the last 72  hours. Sepsis Labs: Recent Labs  Lab 11/22/20 1459  LATICACIDVEN 1.3    Recent Results (from the past 240 hour(s))  Culture, blood (routine x 2)     Status: Abnormal   Collection Time: 11/22/20  2:59 PM   Specimen: BLOOD  Result Value Ref Range Status   Specimen Description   Final    BLOOD LEFT ANTECUBITAL Performed at [facility] [address] [place], [place] [address]    Special Requests   Final    BOTTLES DRAWN AEROBIC ONLY Blood Culture adequate volume Performed at [facility] [address] [place], [place] [address]    Culture  Setup Time   Final    GRAM NEGATIVE RODS AEROBIC BOTTLE ONLY CRITICAL VALUE NOTED.  VALUE IS CONSISTENT WITH PREVIOUSLY REPORTED AND CALLED VALUE.    Culture (A)  Final    ESCHERICHIA COLI SUSCEPTIBILITIES PERFORMED ON PREVIOUS CULTURE WITHIN THE LAST 5 DAYS. Performed at [facility], [place] [address] [place], [place] [address]    Report Status 11/26/2020 FINAL  Final  Culture, blood (routine x 2)     Status: Abnormal   Collection Time: 11/22/20  2:59 PM   Specimen: BLOOD LEFT HAND  Result Value Ref Range Status   Specimen Description   Final    BLOOD LEFT HAND Performed at [facility] [address] [place], [place] [address]    Special Requests   Final    BOTTLES DRAWN AEROBIC ONLY Blood Culture adequate volume Performed at [facility] [address], [place], [place] [address]    Culture  Setup Time   Final    GRAM NEGATIVE RODS AEROBIC BOTTLE ONLY CRITICAL RESULT CALLED TO, READ BACK BY AND VERIFIED WITH: Ulice Bold PharmD 9:25 11/23/20 ([email or web address]) Performed at [facility], [place] [address] [place], [place] [address]    Culture (A)  Final    ESCHERICHIA COLI Confirmed Extended  Spectrum Beta-Lactamase Producer (ESBL).  In bloodstream infections from ESBL organisms, carbapenems are preferred over piperacillin/tazobactam. They are shown to  have a lower risk of mortality.    Report Status 11/26/2020 FINAL  Final   Organism ID, Bacteria ESCHERICHIA COLI  Final      Susceptibility   Escherichia coli - MIC*    AMPICILLIN >=32 RESISTANT Resistant     CEFAZOLIN >=64 RESISTANT Resistant     CEFEPIME >=32 RESISTANT Resistant     CEFTAZIDIME 32 RESISTANT Resistant     CEFTRIAXONE >=64 RESISTANT Resistant     CIPROFLOXACIN >=4 RESISTANT Resistant     GENTAMICIN <=1 SENSITIVE Sensitive     IMIPENEM <=0.25 SENSITIVE Sensitive     TRIMETH/SULFA <=20 SENSITIVE Sensitive     AMPICILLIN/SULBACTAM >=32 RESISTANT Resistant     * ESCHERICHIA COLI  Blood Culture ID Panel (Reflexed)     Status: Abnormal   Collection Time: 11/22/20  2:59 PM  Result Value Ref Range Status   Enterococcus faecalis NOT DETECTED NOT DETECTED Final   Enterococcus Faecium NOT DETECTED NOT DETECTED Final   Listeria monocytogenes NOT DETECTED NOT DETECTED Final   Staphylococcus species NOT DETECTED NOT DETECTED Final   Staphylococcus aureus (BCID) NOT DETECTED NOT DETECTED Final   Staphylococcus epidermidis NOT DETECTED NOT DETECTED Final   Staphylococcus lugdunensis NOT DETECTED NOT DETECTED Final   Streptococcus species NOT DETECTED NOT DETECTED Final   Streptococcus agalactiae NOT DETECTED NOT DETECTED Final   Streptococcus pneumoniae NOT DETECTED NOT DETECTED Final   Streptococcus pyogenes NOT DETECTED NOT DETECTED Final   A.calcoaceticus-baumannii NOT DETECTED NOT DETECTED Final   Bacteroides fragilis NOT DETECTED NOT DETECTED Final   Enterobacterales DETECTED (A) NOT DETECTED Final    Comment: Enterobacterales represent a large order of gram negative bacteria, not a single organism. CRITICAL RESULT CALLED TO, READ BACK BY AND VERIFIED WITH: Ulice Bold PharmD 9:25 11/23/20 (wilsonm)    Enterobacter cloacae complex NOT DETECTED NOT DETECTED Final   Escherichia coli DETECTED (A) NOT DETECTED Final    Comment: CRITICAL RESULT CALLED TO, READ BACK BY AND  VERIFIED WITH: Ulice Bold PharmD 9:25 11/23/20 ([email or web address])    Klebsiella aerogenes NOT DETECTED NOT DETECTED Final   Klebsiella oxytoca NOT DETECTED NOT DETECTED Final   Klebsiella pneumoniae NOT DETECTED NOT DETECTED Final   Proteus species NOT DETECTED NOT DETECTED Final   Salmonella species NOT DETECTED NOT DETECTED Final   Serratia marcescens NOT DETECTED NOT DETECTED Final   Haemophilus influenzae NOT DETECTED NOT DETECTED Final   Neisseria meningitidis NOT DETECTED NOT DETECTED Final   Pseudomonas aeruginosa NOT DETECTED NOT DETECTED Final   Stenotrophomonas maltophilia NOT DETECTED NOT DETECTED Final   Candida albicans NOT DETECTED NOT DETECTED Final   Candida auris NOT DETECTED NOT DETECTED Final   Candida glabrata NOT DETECTED NOT DETECTED Final   Candida krusei NOT DETECTED NOT DETECTED Final   Candida parapsilosis NOT DETECTED NOT DETECTED Final   Candida tropicalis NOT DETECTED NOT DETECTED Final   Cryptococcus neoformans/gattii NOT DETECTED NOT DETECTED Final   CTX-M ESBL DETECTED (A) NOT DETECTED Final    Comment: CRITICAL RESULT CALLED TO, READ BACK BY AND VERIFIED WITH: Ulice Bold PharmD 9:25 11/23/20 ([email or web address]) (NOTE) Extended spectrum beta-lactamase detected. Recommend a carbapenem as initial therapy.      Carbapenem resistance IMP NOT DETECTED NOT DETECTED Final   Carbapenem resistance KPC NOT DETECTED NOT DETECTED Final   Carbapenem resistance NDM NOT DETECTED NOT DETECTED Final   Carbapenem resist OXA  59 LIKE NOT DETECTED NOT DETECTED Final   Carbapenem resistance VIM NOT DETECTED NOT DETECTED Final    Comment: Performed at [facility], [place] [address], [place], [place] [address]  Culture, blood (routine x 2)     Status: None (Preliminary result)   Collection Time: 11/25/20 11:04 AM   Specimen: BLOOD  Result Value Ref Range Status   Specimen Description   Final    BLOOD SITE NOT SPECIFIED Performed at [facility] [address]  [place], [place] [address]    Special Requests   Final    BOTTLES DRAWN AEROBIC ONLY Blood Culture adequate volume Performed at [facility] [address], [place], [place] [address]    Culture   Final    NO GROWTH 2 DAYS Performed at [facility] [address] [place], [place] [address]    Report Status PENDING  Incomplete  Culture, blood (routine x 2)     Status: None (Preliminary result)   Collection Time: 11/25/20 11:04 AM   Specimen: BLOOD  Result Value Ref Range Status   Specimen Description   Final    BLOOD SITE NOT SPECIFIED Performed at [facility] [address], [place], [place] [address]    Special Requests   Final    BOTTLES DRAWN AEROBIC ONLY Blood Culture adequate volume Performed at [facility] [address] [place], [place] [address]    Culture   Final    NO GROWTH 2 DAYS Performed at [facility] [address], [place], [place] [address]    Report Status PENDING  Incomplete      Radiology Studies: No results found.    Scheduled Meds: . (feeding supplement) PROSource Plus  30 mL Oral BID WC  . B-complex with vitamin C  1 tablet Oral Daily  . Chlorhexidine Gluconate Cloth  6 each Topical Daily  . feeding supplement  1 Container Oral BID WC  . feeding supplement  237 mL Oral TID BM  . folic acid  1 mg Oral Daily  . hydrocerin   Topical BID  . nicotine  14 mg Transdermal Daily  . senna-docusate  1 tablet Oral BID  . tamsulosin  0.4 mg Oral Nightly  . cyanocobalamin  1,000 mcg Oral Daily   Continuous Infusions: . 0.9 % NaCl with KCl 40 mEq / L 75 mL/hr at 11/27/20 0313  . magnesium sulfate bolus IVPB    . meropenem (MERREM) IV 1 g (11/27/20 1024)     LOS: 24 days      Time spent: 25 minutes   Dessa Phi, DO [facility] 11/27/2020, 10:30 AM   Available via Epic secure chat 7am-7pm After these hours, please refer to coverage provider listed on  [email or web address]

## 2020-11-28 ENCOUNTER — Inpatient Hospital Stay: Payer: Medicaid Other | Admitting: Hematology

## 2020-11-28 ENCOUNTER — Inpatient Hospital Stay: Payer: Medicaid Other

## 2020-11-28 DIAGNOSIS — A499 Bacterial infection, unspecified: Secondary | ICD-10-CM | POA: Diagnosis not present

## 2020-11-28 DIAGNOSIS — Z1612 Extended spectrum beta lactamase (ESBL) resistance: Secondary | ICD-10-CM | POA: Diagnosis not present

## 2020-11-28 LAB — BASIC METABOLIC PANEL
Anion gap: 6 (ref 5–15)
BUN: 12 mg/dL (ref 8–23)
CO2: 20 mmol/L — ABNORMAL LOW (ref 22–32)
Calcium: 11.9 mg/dL — ABNORMAL HIGH (ref 8.9–10.3)
Chloride: 111 mmol/L (ref 98–111)
Creatinine, Ser: 0.73 mg/dL (ref 0.61–1.24)
GFR, Estimated: >60 mL/min (ref >60)
Glucose, Bld: 99 mg/dL (ref 70–99)
Potassium: 3.9 mmol/L (ref 3.5–5.1)
Sodium: 137 mmol/L (ref 135–145)

## 2020-11-28 LAB — CBC
HCT: 28.3 % — ABNORMAL LOW (ref 39.0–52.0)
Hemoglobin: 8.6 g/dL — ABNORMAL LOW (ref 13.0–17.0)
MCH: 27.8 pg (ref 26.0–34.0)
MCHC: 30.4 g/dL (ref 30.0–36.0)
MCV: 91.6 fL (ref 80.0–100.0)
Platelets: 468 10*3/uL — ABNORMAL HIGH (ref 150–400)
RBC: 3.09 MIL/uL — ABNORMAL LOW (ref 4.22–5.81)
RDW: 15.8 % — ABNORMAL HIGH (ref 11.5–15.5)
WBC: 18.8 10*3/uL — ABNORMAL HIGH (ref 4.0–10.5)
nRBC: 0 % (ref 0.0–0.2)

## 2020-11-28 LAB — GLUCOSE, CAPILLARY: Glucose-Capillary: 97 mg/dL (ref 70–99)

## 2020-11-28 MED ORDER — SODIUM CHLORIDE 0.9 % IV SOLN: INTRAVENOUS | Status: DC

## 2020-11-28 NOTE — Progress Notes (Signed)
PROGRESS NOTE    Jamie Wilson  VOZ:[id number] DOB: 1956-04-16 DOA: 11/02/2020 PCP: Patient, [name]     Brief Narrative:  Jamie Wilson is a 65 y.o.malewith medical history significant ofbladder CA w/ chronic foley placement; bowel obstruction s/p partial bowel resection w/ colostomy. Patient presented secondary to abdominal pain and found to have evidence of sepsis possibly secondary to necrotic bladder mass. Empiric antibiotics initiated. Urology and oncology consulted and recommendations given. Hospital course complicated by hypercalcemia. Bone scan does not show any lytic or blastic bone lesions suspicious for bone mets.AKI resolved.  Still with some degree of hypercalcemia.  He also had some GI symptoms including nausea, emesis and abdominal distention.  KUB on 1/19 raise concern for SBO but GI symptoms resolved the next day.  Hospitalization further complicated by development of ESBL bacteremia.  He was started on IV Merrem.  New events last 24 hours / Subjective: Voices no new complaints, continues to have low appetite.  Assessment & Plan:   Principal Problem:   ESBL (extended spectrum beta-lactamase) producing bacteria infection Active Problems:   Malnutrition (HCC)   Sepsis (HCC)   Protein-calorie malnutrition, severe   Lower urinary tract infectious disease   Palliative care by specialist   Goals of care, counseling/discussion   Sepsis secondary to E. 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 Jeffie Pollock -Follow-up with oncology in 2 weeks, Dr. Irene Limbo   Hypercalcemia of malignancy -Status post zoledronic acid -Improved, but then trending upward in the past couple of days.  IV fluid resumed  AKI -Resolved  Severe protein calorie malnutrition -Estimated body mass index is 16.9 kg/m as calculated from the following:   Height as of this encounter: 6\' 1"  (1.854 m).   Weight as of this encounter: 58.1 kg.    DVT prophylaxis:  SCDs Start: 11/03/20 2329  Code Status: Full code Family Communication: No family at bedside Disposition Plan:  Status is: Inpatient  Remains inpatient appropriate because:Inpatient level of care appropriate due to severity of illness   Dispo: The patient is from: Home              Anticipated d/c is to: SNF              Anticipated d/c date is: 2 days              Patient currently is not medically stable to d/c.  Remains on IV antibiotics. Unable to deescalate antibiotics since ESBL bacteremia confirmed. Will need merrem at least 7 day tx    Difficult to place patient No   Consultants:   Urology  Oncology  Palliative care medicine   Antimicrobials:  Anti-infectives (From admission, onward)   Start     Dose/Rate Route Frequency Ordered Stop   11/23/20 1000  meropenem (MERREM) 1 g in sodium chloride 0.9 % 100 mL IVPB        1 g 200 mL/hr over 30 Minutes Intravenous Every 8 hours 11/23/20 0937     11/12/20 2000  ciprofloxacin (CIPRO) tablet 500 mg        500  mg Oral 2 times daily 11/12/20 1543 11/17/20 2359   11/10/20 0800  ciprofloxacin (CIPRO) tablet 500 mg  Status:  Discontinued        500 mg Oral Daily with breakfast 11/09/20 1357 11/12/20 1543   11/06/20 1200  ciprofloxacin (CIPRO) tablet 500 mg  Status:  Discontinued        500 mg Oral 2 times daily 11/06/20 1114 11/09/20 1357   11/05/20 1500  cefdinir (OMNICEF) capsule 300 mg  Status:  Discontinued        300 mg Oral Every 12 hours 11/05/20 1337 11/06/20 1114   11/03/20 2200   vancomycin (VANCOREADY) IVPB 500 mg/100 mL  Status:  Discontinued        500 mg 100 mL/hr over 60 Minutes Intravenous Every 12 hours 11/03/20 1402 11/05/20 1335   11/03/20 1415  piperacillin-tazobactam (ZOSYN) IVPB 3.375 g  Status:  Discontinued        3.375 g 12.5 mL/hr over 240 Minutes Intravenous Every 8 hours 11/03/20 1401 11/05/20 1337   11/03/20 0645  vancomycin (VANCOCIN) IVPB 1000 mg/200 mL premix        1,000 mg 200 mL/hr over 60 Minutes Intravenous  Once 11/03/20 0631 11/03/20 1147   11/03/20 0600  piperacillin-tazobactam (ZOSYN) IVPB 4.5 g  Status:  Discontinued        4.5 g 200 mL/hr over 30 Minutes Intravenous  Once 11/03/20 0546 11/03/20 0547   11/03/20 0600  piperacillin-tazobactam (ZOSYN) IVPB 3.375 g        3.375 g 100 mL/hr over 30 Minutes Intravenous To Emergency Dept 11/03/20 0548 11/03/20 0650       Objective: Vitals:   11/27/20 1502 11/27/20 1802 11/27/20 2102 11/28/20 0517  BP: 100/65 112/70 110/70 104/64  Pulse: (!) 108 (!) 107 (!) 109 (!) 103  Resp: 17 18 18 18   Temp: 99.1 F (37.3 C) 98.4 F (36.9 C) 97.6 F (36.4 C) 97.8 F (36.6 C)  TempSrc: Oral Oral Oral Oral  SpO2: 97% 97% 98% 99%  Weight:      Height:        Intake/Output Summary (Last 24 hours) at 11/28/2020 1140 Last data filed at 11/28/2020 0803 Gross per 24 hour  Intake 3146.28 ml  Output 3125 ml  Net 21.28 ml   Filed Weights   11/03/20 0852 11/23/20 1017 11/24/20 0508  Weight: 50.7 kg 59.9 kg 58.1 kg    Examination: General exam: Appears calm and comfortable, frail appearing Respiratory system: Clear to auscultation. Respiratory effort normal. Cardiovascular system: S1 & S2 heard, RRR. No pedal edema. Gastrointestinal system: Abdomen is nondistended, soft and nontender. Normal bowel sounds heard. Central nervous system: Alert and oriented. Non focal exam. Speech clear  Extremities: Symmetric in appearance bilaterally  Skin: No rashes, lesions or ulcers on exposed skin   Psychiatry: Judgement and insight appear stable. Mood & affect appropriate.    Data Reviewed: I have personally reviewed following labs and imaging studies  CBC: Recent Labs  Lab 11/24/20 0507 11/25/20 0528 11/26/20 [id number] 11/27/20 0603 11/28/20 0602  WBC 18.1* 15.1* 14.1* 18.3* 18.8*  HGB 9.4* 8.2* 8.3* 7.8* 8.6*  HCT 29.6* 26.1* 27.3* 25.8* 28.3*  MCV 88.6 90.0 89.8 91.8 91.6  PLT 447* 396 429* 434* [id number]*   Basic Metabolic Panel: Recent Labs  Lab 11/22/20 0516 11/23/20 0515 11/24/20 0507 11/25/20 0528 11/26/20 0638 11/27/20 0603 11/28/20 0602  NA 139 136 136 136 137 137 137  K 3.0* 3.5 3.1* 2.7* 2.6* 3.1* 3.9  CL  114* 112* 109 109 109 111 111  CO2 17* 18* 19* 20* 19* 20* 20*  GLUCOSE 122* 105* 118* 102* 110* 95 99  BUN 15 12 12 14 10 11 12   CREATININE 0.85 0.86 0.91 0.81 0.73 0.73 0.73  CALCIUM 10.6* 10.4* 11.0* 10.4* 10.2 10.8* 11.9*  MG 1.7 1.7  --   --  1.7 1.7  --   PHOS 1.2* 2.1*  --   --   --   --   --    GFR: Estimated Creatinine Clearance: 75.7 mL/min (by C-G formula based on SCr of 0.73 mg/dL). Liver Function Tests: Recent Labs  Lab 11/22/20 0516 11/23/20 0515  AST  --  16  ALT  --  12  ALKPHOS  --  94  BILITOT  --  0.3  PROT  --  6.3*  ALBUMIN 2.1* 2.0*   No results for input(s): LIPASE, AMYLASE in the last 168 hours. No results for input(s): AMMONIA in the last 168 hours. Coagulation Profile: No results for input(s): INR, PROTIME in the last 168 hours. Cardiac Enzymes: No results for input(s): CKTOTAL, CKMB, CKMBINDEX, TROPONINI in the last 168 hours. BNP (last 3 results) No results for input(s): PROBNP in the last 8760 hours. HbA1C: No results for input(s): HGBA1C in the last 72 hours. CBG: Recent Labs  Lab 11/22/20 0707 11/23/20 0558 11/25/20 0648 11/26/20 0411 11/28/20 0521  GLUCAP 98 112* 118* 116* 97   Lipid Profile: No results for input(s): CHOL, HDL, LDLCALC, TRIG, CHOLHDL, LDLDIRECT in the last 72 hours. Thyroid Function  Tests: No results for input(s): TSH, T4TOTAL, FREET4, T3FREE, THYROIDAB in the last 72 hours. Anemia Panel: No results for input(s): VITAMINB12, FOLATE, FERRITIN, TIBC, IRON, RETICCTPCT in the last 72 hours. Sepsis Labs: Recent Labs  Lab 11/22/20 1459  LATICACIDVEN 1.3    Recent Results (from the past 240 hour(s))  Culture, blood (routine x 2)     Status: Abnormal   Collection Time: 11/22/20  2:59 PM   Specimen: BLOOD  Result Value Ref Range Status   Specimen Description   Final    BLOOD LEFT ANTECUBITAL Performed at [facility] [address] [place], [place] [address]    Special Requests   Final    BOTTLES DRAWN AEROBIC ONLY Blood Culture adequate volume Performed at [facility] [address] [place], [place] [address]    Culture  Setup Time   Final    GRAM NEGATIVE RODS AEROBIC BOTTLE ONLY CRITICAL VALUE NOTED.  VALUE IS CONSISTENT WITH PREVIOUSLY REPORTED AND CALLED VALUE.    Culture (A)  Final    ESCHERICHIA COLI SUSCEPTIBILITIES PERFORMED ON PREVIOUS CULTURE WITHIN THE LAST 5 DAYS. Performed at [facility], [place] [address] [place], [place] [address]    Report Status 11/26/2020 FINAL  Final  Culture, blood (routine x 2)     Status: Abnormal   Collection Time: 11/22/20  2:59 PM   Specimen: BLOOD LEFT HAND  Result Value Ref Range Status   Specimen Description   Final    BLOOD LEFT HAND Performed at [facility] [address], [place], [place] [address]    Special Requests   Final    BOTTLES DRAWN AEROBIC ONLY Blood Culture adequate volume Performed at [facility] [address], [place], [place] [address]    Culture  Setup Time   Final    GRAM NEGATIVE RODS AEROBIC BOTTLE ONLY CRITICAL RESULT CALLED TO, READ BACK  BY AND VERIFIED WITH: Ulice Bold PharmD 9:25 11/23/20 ([email or web address]) Performed at [facility], [place] [address], [place], [place] [address]    Culture (A)  Final     ESCHERICHIA COLI Confirmed Extended Spectrum Beta-Lactamase Producer (ESBL).  In bloodstream infections from ESBL organisms, carbapenems are preferred over piperacillin/tazobactam. They are shown to have a lower risk of mortality.    Report Status 11/26/2020 FINAL  Final   Organism ID, Bacteria ESCHERICHIA COLI  Final      Susceptibility   Escherichia coli - MIC*    AMPICILLIN >=32 RESISTANT Resistant     CEFAZOLIN >=64 RESISTANT Resistant     CEFEPIME >=32 RESISTANT Resistant     CEFTAZIDIME 32 RESISTANT Resistant     CEFTRIAXONE >=64 RESISTANT Resistant     CIPROFLOXACIN >=4 RESISTANT Resistant     GENTAMICIN <=1 SENSITIVE Sensitive     IMIPENEM <=0.25 SENSITIVE Sensitive     TRIMETH/SULFA <=20 SENSITIVE Sensitive     AMPICILLIN/SULBACTAM >=32 RESISTANT Resistant     * ESCHERICHIA COLI  Blood Culture ID Panel (Reflexed)     Status: Abnormal   Collection Time: 11/22/20  2:59 PM  Result Value Ref Range Status   Enterococcus faecalis NOT DETECTED NOT DETECTED Final   Enterococcus Faecium NOT DETECTED NOT DETECTED Final   Listeria monocytogenes NOT DETECTED NOT DETECTED Final   Staphylococcus species NOT DETECTED NOT DETECTED Final   Staphylococcus aureus (BCID) NOT DETECTED NOT DETECTED Final   Staphylococcus epidermidis NOT DETECTED NOT DETECTED Final   Staphylococcus lugdunensis NOT DETECTED NOT DETECTED Final   Streptococcus species NOT DETECTED NOT DETECTED Final   Streptococcus agalactiae NOT DETECTED NOT DETECTED Final   Streptococcus pneumoniae NOT DETECTED NOT DETECTED Final   Streptococcus pyogenes NOT DETECTED NOT DETECTED Final   A.calcoaceticus-baumannii NOT DETECTED NOT DETECTED Final   Bacteroides fragilis NOT DETECTED NOT DETECTED Final   Enterobacterales DETECTED (A) NOT DETECTED Final    Comment: Enterobacterales represent a large order of gram negative bacteria, not a single organism. CRITICAL RESULT CALLED TO, READ BACK BY AND VERIFIED WITH: Ulice Bold PharmD  9:25 11/23/20 (wilsonm)    Enterobacter cloacae complex NOT DETECTED NOT DETECTED Final   Escherichia coli DETECTED (A) NOT DETECTED Final    Comment: CRITICAL RESULT CALLED TO, READ BACK BY AND VERIFIED WITH: Ulice Bold PharmD 9:25 11/23/20 (wilsonm)    Klebsiella aerogenes NOT DETECTED NOT DETECTED Final   Klebsiella oxytoca NOT DETECTED NOT DETECTED Final   Klebsiella pneumoniae NOT DETECTED NOT DETECTED Final   Proteus species NOT DETECTED NOT DETECTED Final   Salmonella species NOT DETECTED NOT DETECTED Final   Serratia marcescens NOT DETECTED NOT DETECTED Final   Haemophilus influenzae NOT DETECTED NOT DETECTED Final   Neisseria meningitidis NOT DETECTED NOT DETECTED Final   Pseudomonas aeruginosa NOT DETECTED NOT DETECTED Final   Stenotrophomonas maltophilia NOT DETECTED NOT DETECTED Final   Candida albicans NOT DETECTED NOT DETECTED Final   Candida auris NOT DETECTED NOT DETECTED Final   Candida glabrata NOT DETECTED NOT DETECTED Final   Candida krusei NOT DETECTED NOT DETECTED Final   Candida parapsilosis NOT DETECTED NOT DETECTED Final   Candida tropicalis NOT DETECTED NOT DETECTED Final   Cryptococcus neoformans/gattii NOT DETECTED NOT DETECTED Final   CTX-M ESBL DETECTED (A) NOT DETECTED Final    Comment: CRITICAL RESULT CALLED TO, READ BACK BY AND VERIFIED WITH: Ulice Bold PharmD 9:25 11/23/20 (wilsonm) (NOTE) Extended spectrum beta-lactamase detected. Recommend a carbapenem as initial therapy.  Carbapenem resistance IMP NOT DETECTED NOT DETECTED Final   Carbapenem resistance KPC NOT DETECTED NOT DETECTED Final   Carbapenem resistance NDM NOT DETECTED NOT DETECTED Final   Carbapenem resist OXA 48 LIKE NOT DETECTED NOT DETECTED Final   Carbapenem resistance VIM NOT DETECTED NOT DETECTED Final    Comment: Performed at [facility], [place] [address], [place], [place] [address]  Culture, blood (routine x 2)     Status: None (Preliminary result)   Collection Time:  11/25/20 11:04 AM   Specimen: BLOOD  Result Value Ref Range Status   Specimen Description   Final    BLOOD SITE NOT SPECIFIED Performed at [facility] [address], [place], [place] [address]    Special Requests   Final    BOTTLES DRAWN AEROBIC ONLY Blood Culture adequate volume Performed at [facility] [address] [place], [place] [address]    Culture   Final    NO GROWTH 2 DAYS Performed at [facility] [address], [place], [place] [address]    Report Status PENDING  Incomplete  Culture, blood (routine x 2)     Status: None (Preliminary result)   Collection Time: 11/25/20 11:04 AM   Specimen: BLOOD  Result Value Ref Range Status   Specimen Description   Final    BLOOD SITE NOT SPECIFIED Performed at [facility] [address], [place], [place] [address]    Special Requests   Final    BOTTLES DRAWN AEROBIC ONLY Blood Culture adequate volume Performed at [facility] [address], [place], [place] [address]    Culture   Final    NO GROWTH 2 DAYS Performed at [facility] [address] [place],  [address]    Report Status PENDING  Incomplete      Radiology Studies: No results found.    Scheduled Meds: . (feeding supplement) PROSource Plus  30 mL Oral BID WC  . B-complex with vitamin C  1 tablet Oral Daily  . Chlorhexidine Gluconate Cloth  6 each Topical Daily  . feeding supplement  1 Container Oral BID WC  . feeding supplement  237 mL Oral TID BM  . folic acid  1 mg Oral Daily  . hydrocerin   Topical BID  . nicotine  14 mg Transdermal Daily  . senna-docusate  1 tablet Oral BID  . tamsulosin  0.4 mg Oral Nightly  . cyanocobalamin  1,000 mcg Oral Daily   Continuous Infusions: . sodium chloride 100 mL/hr at 11/28/20 1022  . meropenem (MERREM) IV 1 g (11/28/20 1024)     LOS: 25 days      Time spent: 25 minutes   Dessa Phi, DO [facility] 11/28/2020, 11:40 AM   Available via Epic secure chat 7am-7pm After these hours, please refer to coverage provider listed on [email or web address]

## 2020-11-28 NOTE — Progress Notes (Signed)
Physical Therapy Treatment Patient Details Name: Jamie Wilson MRN: [id number] DOB: May 23, 1956 Today's Date: 11/28/2020    History of Present Illness 65 y.o. 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 Patient presented secondary to abdominal pain and found to have evidence of sepsis possibly secondary to necrotic bladder mass. Empiric antibiotics initiated. Urology and oncology consulted.    PT Comments    Pt progressing toward acute PT goals and agreeable to ambulate to [facility] near sink to sit and wash up. Pt required extra time and multiple cues at beginning of session for cooperation with progressing LEs to EOB in prep for sit to stand transfers. Pt required MIN assist to stand from elevated surface at EOB and MIN guard for stand from [facility] for safety. Pt required MIN assist progressing to MIN guard for ambulation in room. Pt was able to wash most of his body in sitting/standing with therapist assisting with washing his back. Acute therapy to follow up during stay.      Follow Up Recommendations  SNF     Equipment Recommendations  None recommended by PT    Recommendations for Other Services       Precautions / Restrictions Precautions Precautions: Fall Restrictions Weight Bearing Restrictions: No    Mobility  Bed Mobility Overal bed mobility: Modified Independent Bed Mobility: Supine to Sit;Sit to Supine     Supine to sit: Supervision;HOB elevated Sit to supine: Supervision;HOB elevated   General bed mobility comments: pt able to go supine <>sit with use of B UEs to scoot to EOB with assist for line management and supervision for safety. Extra time as pt required multiple cues from therapist for cooperation with moving LEs to EOB in prep for sit to stand transfer.  Transfers Overall transfer level: Needs assistance Equipment used: Rolling walker (2 wheeled) Transfers: Sit to/from  Stand Sit to Stand: From elevated surface         General transfer comment: pt required MIN assist for safety/steadying with sit to stand from elevated EOB and MIN guard for rise from [facility].  Ambulation/Gait Ambulation/Gait assistance: Min assist;Min guard Gait Distance (Feet): 10 Feet Assistive device: Rolling walker (2 wheeled) Gait Pattern/deviations: Step-through pattern;Decreased stride length;Trunk flexed Gait velocity: decreased   General Gait Details: Pt agreeable to ambulate to [facility] near sink to sit and wash up. MIN assist progressing to MIN guard for safety and line management.   Stairs             Wheelchair Mobility    Modified Rankin (Stroke Patients Only)       Balance Overall balance assessment: Needs assistance Sitting-balance support: Feet supported;No upper extremity supported Sitting balance-Leahy Scale: Good Sitting balance - Comments: pt was able to lift and wash up LEs and trunk region without use of UE support.   Standing balance support: Bilateral upper extremity supported;During functional activity;Single extremity supported Standing balance-Leahy Scale: Poor Standing balance comment: pt was able to perfrom peri care with single UE support on RW and MIN guard from therapist for safety                            Cognition Arousal/Alertness: Awake/alert Behavior During Therapy: [facility] for tasks assessed/performed Overall Cognitive Status: Within Functional Limits for tasks assessed  Exercises      General Comments        Pertinent Vitals/Pain Pain Assessment: Faces Faces Pain Scale: Hurts even more Pain Location: Bilat feet/toes Pain Descriptors / Indicators: Discomfort;Grimacing Pain Intervention(s): Limited activity within patient's tolerance;Monitored during session;Repositioned;Patient requesting pain meds-RN notified    Home Living                       Prior Function            PT Goals (current goals can now be found in the care plan section) Acute Rehab PT Goals Patient Stated Goal: none stated PT Goal Formulation: With patient Time For Goal Achievement: 12/12/20 Potential to Achieve Goals: Fair Progress towards PT goals: Progressing toward goals    Frequency    Min 2X/week      PT Plan Current plan remains appropriate    Co-evaluation              AM-PAC PT "6 Clicks" Mobility   Outcome Measure  Help needed turning from your back to your side while in a flat bed without using bedrails?: None Help needed moving from lying on your back to sitting on the side of a flat bed without using bedrails?: None Help needed moving to and from a bed to a chair (including a wheelchair)?: A Little Help needed standing up from a chair using your arms (e.g., wheelchair or bedside chair)?: A Little Help needed to walk in hospital room?: A Little Help needed climbing 3-5 steps with a railing? : A Lot 6 Click Score: 19    End of Session Equipment Utilized During Treatment: Gait belt Activity Tolerance: Patient tolerated treatment well Patient left: in bed;with call bell/phone within reach Nurse Communication: Mobility status PT Visit Diagnosis: Other abnormalities of gait and mobility (R26.89);Muscle weakness (generalized) (M62.81)     Time: 7544-9201 PT Time Calculation (min) (ACUTE ONLY): 41 min  Charges:  $Gait Training: 8-22 mins $Therapeutic Activity: 23-37 mins                     Jamie Wilson, SPT  Acute rehab     Jamie Wilson 11/28/2020, 3:16 PM

## 2020-11-28 NOTE — TOC Progression Note (Signed)
Transition of Care [facility]) - Progression Note    Patient Details  Name: Jamie Wilson MRN: [id number] Date of Birth: 03-Feb-1956  Transition of Care [facility]) CM/SW [place], LCSW Phone Number: 11/28/2020, 12:24 PM  Clinical Narrative:    Patient not medically stable to transfer SNF 2-3 days.  SNF updated.  SNF is requesting updated physical therapy notes for insurance purposes.    Expected Discharge Plan: Skilled Nursing Facility Barriers to Discharge: Continued Medical Work up  Expected Discharge Plan and Services Expected Discharge Plan: [place]         Expected Discharge Date:  (unknown)                                     Social Determinants of Health (SDOH) Interventions    Readmission Risk Interventions Readmission Risk Prevention Plan 10/04/2020  Transportation Screening Complete  PCP or Specialist Appt within 5-7 Days Complete  Home Care Screening Complete  Medication Review (RN CM) Complete

## 2020-11-29 DIAGNOSIS — Z1612 Extended spectrum beta lactamase (ESBL) resistance: Secondary | ICD-10-CM | POA: Diagnosis not present

## 2020-11-29 DIAGNOSIS — A499 Bacterial infection, unspecified: Secondary | ICD-10-CM | POA: Diagnosis not present

## 2020-11-29 LAB — BASIC METABOLIC PANEL
Anion gap: 6 (ref 5–15)
BUN: 16 mg/dL (ref 8–23)
CO2: 23 mmol/L (ref 22–32)
Calcium: 12.8 mg/dL — ABNORMAL HIGH (ref 8.9–10.3)
Chloride: 110 mmol/L (ref 98–111)
Creatinine, Ser: 0.86 mg/dL (ref 0.61–1.24)
GFR, Estimated: >60 mL/min (ref >60)
Glucose, Bld: 96 mg/dL (ref 70–99)
Potassium: 4.1 mmol/L (ref 3.5–5.1)
Sodium: 139 mmol/L (ref 135–145)

## 2020-11-29 LAB — CBC
HCT: 28.7 % — ABNORMAL LOW (ref 39.0–52.0)
Hemoglobin: 8.6 g/dL — ABNORMAL LOW (ref 13.0–17.0)
MCH: 27.4 pg (ref 26.0–34.0)
MCHC: 30 g/dL (ref 30.0–36.0)
MCV: 91.4 fL (ref 80.0–100.0)
Platelets: 463 10*3/uL — ABNORMAL HIGH (ref 150–400)
RBC: 3.14 MIL/uL — ABNORMAL LOW (ref 4.22–5.81)
RDW: 15.9 % — ABNORMAL HIGH (ref 11.5–15.5)
WBC: 18.7 10*3/uL — ABNORMAL HIGH (ref 4.0–10.5)
nRBC: 0 % (ref 0.0–0.2)

## 2020-11-29 NOTE — Progress Notes (Signed)
PROGRESS NOTE    Jamie Wilson  SNK:[id number] DOB: Mar 29, 1956 DOA: 11/02/2020 PCP: Patient, [name]     Brief Narrative:  Jamie Wilson is a 65 y.o.malewith medical history significant ofbladder CA w/ chronic foley placement; bowel obstruction s/p partial bowel resection w/ colostomy. Patient presented secondary to abdominal pain and found to have evidence of sepsis possibly secondary to necrotic bladder mass. Empiric antibiotics initiated. Urology and oncology consulted and recommendations given. Hospital course complicated by hypercalcemia. Bone scan does not show any lytic or blastic bone lesions suspicious for bone mets.AKI resolved.  Still with some degree of hypercalcemia.  He also had some GI symptoms including nausea, emesis and abdominal distention.  KUB on 1/19 raise concern for SBO but GI symptoms resolved the next day.  Hospitalization further complicated by development of ESBL bacteremia.  He was started on IV Merrem.  New events last 24 hours / Subjective: Complains of bladder pain.   Advised patient that he needs to stop dairy/milk products that contains calcium.   Assessment & Plan:   Principal Problem:   ESBL (extended spectrum beta-lactamase) producing bacteria infection Active Problems:   Malnutrition (HCC)   Sepsis (HCC)   Protein-calorie malnutrition, severe   Lower urinary tract infectious disease   Palliative care by specialist   Goals of care, counseling/discussion   Sepsis secondary to E. 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 Jeffie Pollock -Follow-up with oncology in 2 weeks, Dr. Irene Limbo   Hypercalcemia of malignancy -Status post zoledronic acid -Initially improved, but then trending upward in the past couple of days.  IV fluid resumed. Advised patient that he needs to stop dairy/milk products that contains calcium.   AKI -Resolved  Severe protein calorie malnutrition -Estimated body mass index is 16.9 kg/m as calculated from the following:   Height as of this encounter: 6\' 1"  (1.854 m).   Weight as of this encounter: 58.1 kg.    DVT prophylaxis:  SCDs Start: 11/03/20 2329  Code Status: Full code Family Communication: No family at bedside Disposition Plan:  Status is: Inpatient  Remains inpatient appropriate because:Inpatient level of care appropriate due to severity of illness   Dispo: The patient is from: Home              Anticipated d/c is to: SNF              Anticipated d/c date is: 2 days              Patient currently is not medically stable to d/c.  Remains on IV antibiotics. Unable to deescalate antibiotics since ESBL bacteremia confirmed.    Difficult to place patient No   Consultants:   Urology  Oncology  Palliative care medicine   Antimicrobials:  Anti-infectives (From admission, onward)   Start     Dose/Rate Route Frequency Ordered Stop   11/23/20 1000  meropenem (MERREM) 1 g in sodium chloride 0.9 % 100 mL IVPB        1 g 200 mL/hr over 30 Minutes Intravenous Every 8 hours 11/23/20 0937  11/12/20 2000  ciprofloxacin (CIPRO) tablet 500 mg        500 mg Oral 2 times daily 11/12/20 1543 11/17/20 2359   11/10/20 0800  ciprofloxacin (CIPRO) tablet 500 mg  Status:  Discontinued        500 mg Oral Daily with breakfast 11/09/20 1357 11/12/20 1543   11/06/20 1200  ciprofloxacin (CIPRO) tablet 500 mg  Status:  Discontinued        500 mg Oral 2 times daily 11/06/20 1114 11/09/20 1357   11/05/20 1500  cefdinir  (OMNICEF) capsule 300 mg  Status:  Discontinued        300 mg Oral Every 12 hours 11/05/20 1337 11/06/20 1114   11/03/20 2200  vancomycin (VANCOREADY) IVPB 500 mg/100 mL  Status:  Discontinued        500 mg 100 mL/hr over 60 Minutes Intravenous Every 12 hours 11/03/20 1402 11/05/20 1335   11/03/20 1415  piperacillin-tazobactam (ZOSYN) IVPB 3.375 g  Status:  Discontinued        3.375 g 12.5 mL/hr over 240 Minutes Intravenous Every 8 hours 11/03/20 1401 11/05/20 1337   11/03/20 0645  vancomycin (VANCOCIN) IVPB 1000 mg/200 mL premix        1,000 mg 200 mL/hr over 60 Minutes Intravenous  Once 11/03/20 0631 11/03/20 1147   11/03/20 0600  piperacillin-tazobactam (ZOSYN) IVPB 4.5 g  Status:  Discontinued        4.5 g 200 mL/hr over 30 Minutes Intravenous  Once 11/03/20 0546 11/03/20 0547   11/03/20 0600  piperacillin-tazobactam (ZOSYN) IVPB 3.375 g        3.375 g 100 mL/hr over 30 Minutes Intravenous To Emergency Dept 11/03/20 0548 11/03/20 0650       Objective: Vitals:   11/27/20 2102 11/28/20 0517 11/28/20 1404 11/29/20 0549  BP: 110/70 104/64 127/76 137/74  Pulse: (!) 109 (!) 103 (!) 104 (!) 102  Resp: 18 18 17 16   Temp: 97.6 F (36.4 C) 97.8 F (36.6 C)    TempSrc: Oral Oral    SpO2: 98% 99% 98% 98%  Weight:      Height:        Intake/Output Summary (Last 24 hours) at 11/29/2020 1007 Last data filed at 11/29/2020 0600 Gross per 24 hour  Intake 2013.06 ml  Output 2700 ml  Net -686.94 ml   Filed Weights   11/03/20 0852 11/23/20 1017 11/24/20 0508  Weight: 50.7 kg 59.9 kg 58.1 kg    Examination: General exam: Appears calm and comfortable  Respiratory system: Clear to auscultation. Respiratory effort normal. Cardiovascular system: S1 & S2 heard, RRR. No pedal edema. Gastrointestinal system: Abdomen is nondistended, soft and nontender. Normal bowel sounds heard. Central nervous system: Alert and oriented. Non focal exam. Speech clear  Extremities: Symmetric in appearance  bilaterally  Skin: No rashes, lesions or ulcers on exposed skin  Psychiatry: Judgement and insight appear stable. Mood & affect appropriate.    Data Reviewed: I have personally reviewed following labs and imaging studies  CBC: Recent Labs  Lab 11/25/20 0528 11/26/20 [id number] 11/27/20 0603 11/28/20 0602 11/29/20 0508  WBC 15.1* 14.1* 18.3* 18.8* 18.7*  HGB 8.2* 8.3* 7.8* 8.6* 8.6*  HCT 26.1* 27.3* 25.8* 28.3* 28.7*  MCV 90.0 89.8 91.8 91.6 91.4  PLT 396 429* 434* 468* [id number]*   Basic Metabolic Panel: Recent Labs  Lab 11/23/20 0515 11/24/20 0507 11/25/20 0528 11/26/20 [id number] 11/27/20 0603 11/28/20 0602 11/29/20 0508  NA 136   < > 136 137 137 137  139  K 3.5   < > 2.7* 2.6* 3.1* 3.9 4.1  CL 112*   < > 109 109 111 111 110  CO2 18*   < > 20* 19* 20* 20* 23  GLUCOSE 105*   < > 102* 110* 95 99 96  BUN 12   < > 14 10 11 12 16   CREATININE 0.86   < > 0.81 0.73 0.73 0.73 0.86  CALCIUM 10.4*   < > 10.4* 10.2 10.8* 11.9* 12.8*  MG 1.7  --   --  1.7 1.7  --   --   PHOS 2.1*  --   --   --   --   --   --    < > = values in this interval not displayed.   GFR: Estimated Creatinine Clearance: 70.4 mL/min (by C-G formula based on SCr of 0.86 mg/dL). Liver Function Tests: Recent Labs  Lab 11/23/20 0515  AST 16  ALT 12  ALKPHOS 94  BILITOT 0.3  PROT 6.3*  ALBUMIN 2.0*   No results for input(s): LIPASE, AMYLASE in the last 168 hours. No results for input(s): AMMONIA in the last 168 hours. Coagulation Profile: No results for input(s): INR, PROTIME in the last 168 hours. Cardiac Enzymes: No results for input(s): CKTOTAL, CKMB, CKMBINDEX, TROPONINI in the last 168 hours. BNP (last 3 results) No results for input(s): PROBNP in the last 8760 hours. HbA1C: No results for input(s): HGBA1C in the last 72 hours. CBG: Recent Labs  Lab 11/23/20 0558 11/25/20 0648 11/26/20 0411 11/28/20 0521  GLUCAP 112* 118* 116* 97   Lipid Profile: No results for input(s): CHOL, HDL, LDLCALC, TRIG,  CHOLHDL, LDLDIRECT in the last 72 hours. Thyroid Function Tests: No results for input(s): TSH, T4TOTAL, FREET4, T3FREE, THYROIDAB in the last 72 hours. Anemia Panel: No results for input(s): VITAMINB12, FOLATE, FERRITIN, TIBC, IRON, RETICCTPCT in the last 72 hours. Sepsis Labs: Recent Labs  Lab 11/22/20 1459  LATICACIDVEN 1.3    Recent Results (from the past 240 hour(s))  Culture, blood (routine x 2)     Status: Abnormal   Collection Time: 11/22/20  2:59 PM   Specimen: BLOOD  Result Value Ref Range Status   Specimen Description   Final    BLOOD LEFT ANTECUBITAL Performed at [facility] [address] [place], [place] [address]    Special Requests   Final    BOTTLES DRAWN AEROBIC ONLY Blood Culture adequate volume Performed at [facility] [address], [place], [place] [address]    Culture  Setup Time   Final    GRAM NEGATIVE RODS AEROBIC BOTTLE ONLY CRITICAL VALUE NOTED.  VALUE IS CONSISTENT WITH PREVIOUSLY REPORTED AND CALLED VALUE.    Culture (A)  Final    ESCHERICHIA COLI SUSCEPTIBILITIES PERFORMED ON PREVIOUS CULTURE WITHIN THE LAST 5 DAYS. Performed at [facility], [place] [address], [place], [place] [address]    Report Status 11/26/2020 FINAL  Final  Culture, blood (routine x 2)     Status: Abnormal   Collection Time: 11/22/20  2:59 PM   Specimen: BLOOD LEFT HAND  Result Value Ref Range Status   Specimen Description   Final    BLOOD LEFT HAND Performed at [facility] [address], [place],  [address]    Special Requests   Final    BOTTLES DRAWN AEROBIC ONLY Blood Culture adequate volume Performed at [facility] [address] [place],  [place] [address]    Culture  Setup Time   Final    GRAM NEGATIVE RODS AEROBIC BOTTLE ONLY CRITICAL RESULT CALLED TO, READ BACK BY AND VERIFIED WITH: Ulice Bold PharmD 9:25 11/23/20 ([email or web address]) Performed at [facility],  [address] [place], [place] [address]    Culture (A)  Final    ESCHERICHIA COLI Confirmed Extended Spectrum Beta-Lactamase Producer (ESBL).  In bloodstream infections from ESBL organisms, carbapenems are preferred over piperacillin/tazobactam. They are shown to have a lower risk of mortality.    Report Status 11/26/2020 FINAL  Final   Organism ID, Bacteria ESCHERICHIA COLI  Final      Susceptibility   Escherichia coli - MIC*    AMPICILLIN >=32 RESISTANT Resistant     CEFAZOLIN >=64 RESISTANT Resistant     CEFEPIME >=32 RESISTANT Resistant     CEFTAZIDIME 32 RESISTANT Resistant     CEFTRIAXONE >=64 RESISTANT Resistant     CIPROFLOXACIN >=4 RESISTANT Resistant     GENTAMICIN <=1 SENSITIVE Sensitive     IMIPENEM <=0.25 SENSITIVE Sensitive     TRIMETH/SULFA <=20 SENSITIVE Sensitive     AMPICILLIN/SULBACTAM >=32 RESISTANT Resistant     * ESCHERICHIA COLI  Blood Culture ID Panel (Reflexed)     Status: Abnormal   Collection Time: 11/22/20  2:59 PM  Result Value Ref Range Status   Enterococcus faecalis NOT DETECTED NOT DETECTED Final   Enterococcus Faecium NOT DETECTED NOT DETECTED Final   Listeria monocytogenes NOT DETECTED NOT DETECTED Final   Staphylococcus species NOT DETECTED NOT DETECTED Final   Staphylococcus aureus (BCID) NOT DETECTED NOT DETECTED Final   Staphylococcus epidermidis NOT DETECTED NOT DETECTED Final   Staphylococcus lugdunensis NOT DETECTED NOT DETECTED Final   Streptococcus species NOT DETECTED NOT DETECTED Final   Streptococcus agalactiae NOT DETECTED NOT DETECTED Final   Streptococcus pneumoniae NOT DETECTED NOT DETECTED Final   Streptococcus pyogenes NOT DETECTED NOT DETECTED Final   A.calcoaceticus-baumannii NOT DETECTED NOT DETECTED Final   Bacteroides fragilis NOT DETECTED NOT DETECTED Final   Enterobacterales DETECTED (A) NOT DETECTED Final    Comment: Enterobacterales represent a large order of gram negative bacteria, not a single organism. CRITICAL  RESULT CALLED TO, READ BACK BY AND VERIFIED WITH: Ulice Bold PharmD 9:25 11/23/20 ([email or web address])    Enterobacter cloacae complex NOT DETECTED NOT DETECTED Final   Escherichia coli DETECTED (A) NOT DETECTED Final    Comment: CRITICAL RESULT CALLED TO, READ BACK BY AND VERIFIED WITH: Ulice Bold PharmD 9:25 11/23/20 ([email or web address])    Klebsiella aerogenes NOT DETECTED NOT DETECTED Final   Klebsiella oxytoca NOT DETECTED NOT DETECTED Final   Klebsiella pneumoniae NOT DETECTED NOT DETECTED Final   Proteus species NOT DETECTED NOT DETECTED Final   Salmonella species NOT DETECTED NOT DETECTED Final   Serratia marcescens NOT DETECTED NOT DETECTED Final   Haemophilus influenzae NOT DETECTED NOT DETECTED Final   Neisseria meningitidis NOT DETECTED NOT DETECTED Final   Pseudomonas aeruginosa NOT DETECTED NOT DETECTED Final   Stenotrophomonas maltophilia NOT DETECTED NOT DETECTED Final   Candida albicans NOT DETECTED NOT DETECTED Final   Candida auris NOT DETECTED NOT DETECTED Final   Candida glabrata NOT DETECTED NOT DETECTED Final   Candida krusei NOT DETECTED NOT DETECTED Final   Candida parapsilosis NOT DETECTED NOT DETECTED Final   Candida tropicalis NOT DETECTED NOT DETECTED Final   Cryptococcus neoformans/gattii NOT DETECTED NOT DETECTED Final   CTX-M ESBL DETECTED (A) NOT DETECTED Final    Comment: CRITICAL  RESULT CALLED TO, READ BACK BY AND VERIFIED WITH: Ulice Bold PharmD 9:25 11/23/20 ([email or web address]) (NOTE) Extended spectrum beta-lactamase detected. Recommend a carbapenem as initial therapy.      Carbapenem resistance IMP NOT DETECTED NOT DETECTED Final   Carbapenem resistance KPC NOT DETECTED NOT DETECTED Final   Carbapenem resistance NDM NOT DETECTED NOT DETECTED Final   Carbapenem resist OXA 48 LIKE NOT DETECTED NOT DETECTED Final   Carbapenem resistance VIM NOT DETECTED NOT DETECTED Final    Comment: Performed at [facility], [place] [address], [place], [place] [address]  Culture, blood (routine  x 2)     Status: None (Preliminary result)   Collection Time: 11/25/20 11:04 AM   Specimen: BLOOD  Result Value Ref Range Status   Specimen Description   Final    BLOOD SITE NOT SPECIFIED Performed at [facility] [address] [place], [place] [address]    Special Requests   Final    BOTTLES DRAWN AEROBIC ONLY Blood Culture adequate volume Performed at [facility] [address], [place], [place] [address]    Culture   Final    NO GROWTH 3 DAYS Performed at [facility], [place] [address] [place], [place] [address]    Report Status PENDING  Incomplete  Culture, blood (routine x 2)     Status: None (Preliminary result)   Collection Time: 11/25/20 11:04 AM   Specimen: BLOOD  Result Value Ref Range Status   Specimen Description   Final    BLOOD SITE NOT SPECIFIED Performed at [facility] [address], [place], [place] [address]    Special Requests   Final    BOTTLES DRAWN AEROBIC ONLY Blood Culture adequate volume Performed at [facility] [address], [place], [place] [address]    Culture   Final    NO GROWTH 3 DAYS Performed at [facility], [place] [address], [place],  [address]    Report Status PENDING  Incomplete      Radiology Studies: No results found.    Scheduled Meds: . (feeding supplement) PROSource Plus  30 mL Oral BID WC  . B-complex with vitamin C  1 tablet Oral Daily  . Chlorhexidine Gluconate Cloth  6 each Topical Daily  . feeding supplement  1 Container Oral BID WC  . feeding supplement  237 mL Oral TID BM  . folic acid  1 mg Oral Daily  . hydrocerin   Topical BID  . nicotine  14 mg Transdermal Daily  . senna-docusate  1 tablet Oral BID  . tamsulosin  0.4 mg Oral Nightly  . cyanocobalamin  1,000 mcg Oral Daily   Continuous Infusions: . sodium chloride 100 mL/hr at 11/28/20 1022  . meropenem (MERREM) IV 1 g (11/29/20 0103)     LOS: 26 days       Time spent: 25 minutes   Dessa Phi, DO [facility] 11/29/2020, 10:07 AM   Available via Epic secure chat 7am-7pm After these hours, please refer to coverage provider listed on [email or web address]

## 2020-11-29 NOTE — Plan of Care (Signed)

## 2020-11-30 DIAGNOSIS — C679 Malignant neoplasm of bladder, unspecified: Secondary | ICD-10-CM | POA: Diagnosis not present

## 2020-11-30 DIAGNOSIS — E43 Unspecified severe protein-calorie malnutrition: Secondary | ICD-10-CM | POA: Diagnosis not present

## 2020-11-30 DIAGNOSIS — A499 Bacterial infection, unspecified: Secondary | ICD-10-CM | POA: Diagnosis not present

## 2020-11-30 LAB — CULTURE, BLOOD (ROUTINE X 2)
Culture: NO GROWTH
Culture: NO GROWTH
Special Requests: ADEQUATE
Special Requests: ADEQUATE

## 2020-11-30 LAB — BASIC METABOLIC PANEL
Anion gap: 8 (ref 5–15)
BUN: 14 mg/dL (ref 8–23)
CO2: 21 mmol/L — ABNORMAL LOW (ref 22–32)
Calcium: 12.4 mg/dL — ABNORMAL HIGH (ref 8.9–10.3)
Chloride: 107 mmol/L (ref 98–111)
Creatinine, Ser: 0.82 mg/dL (ref 0.61–1.24)
GFR, Estimated: >60 mL/min (ref >60)
Glucose, Bld: 89 mg/dL (ref 70–99)
Potassium: 3.4 mmol/L — ABNORMAL LOW (ref 3.5–5.1)
Sodium: 136 mmol/L (ref 135–145)

## 2020-11-30 LAB — CBC
HCT: 27.3 % — ABNORMAL LOW (ref 39.0–52.0)
Hemoglobin: 8.3 g/dL — ABNORMAL LOW (ref 13.0–17.0)
MCH: 27.7 pg (ref 26.0–34.0)
MCHC: 30.4 g/dL (ref 30.0–36.0)
MCV: 91 fL (ref 80.0–100.0)
Platelets: 437 10*3/uL — ABNORMAL HIGH (ref 150–400)
RBC: 3 MIL/uL — ABNORMAL LOW (ref 4.22–5.81)
RDW: 15.8 % — ABNORMAL HIGH (ref 11.5–15.5)
WBC: 16.9 10*3/uL — ABNORMAL HIGH (ref 4.0–10.5)
nRBC: 0 % (ref 0.0–0.2)

## 2020-11-30 LAB — GLUCOSE, CAPILLARY: Glucose-Capillary: 92 mg/dL (ref 70–99)

## 2020-11-30 LAB — PTH-RELATED PEPTIDE

## 2020-11-30 LAB — MAGNESIUM: Magnesium: 1.9 mg/dL (ref 1.7–2.4)

## 2020-11-30 MED ORDER — PROSOURCE PLUS PO LIQD
30.0000 mL | Freq: Three times a day (TID) | ORAL | Status: DC
  Administered 2020-11-30 – 2020-12-07 (×13): 30 mL via ORAL
  Filled 2020-11-30 (×12): qty 30

## 2020-11-30 MED ORDER — POTASSIUM CHLORIDE 10 MEQ/100ML IV SOLN
10.0000 meq | INTRAVENOUS | Status: AC
Start: 2020-11-30 — End: 2020-11-30
  Administered 2020-11-30 (×4): 10 meq via INTRAVENOUS
  Filled 2020-11-30: qty 100

## 2020-11-30 MED ORDER — BOOST / RESOURCE BREEZE PO LIQD CUSTOM
1.0000 | Freq: Three times a day (TID) | ORAL | Status: DC
  Administered 2020-11-30 – 2020-12-23 (×35): 1 via ORAL

## 2020-11-30 MED ORDER — POTASSIUM CHLORIDE CRYS ER 20 MEQ PO TBCR
40.0000 meq | EXTENDED_RELEASE_TABLET | Freq: Once | ORAL | Status: DC
  Filled 2020-11-30: qty 2

## 2020-11-30 NOTE — Progress Notes (Addendum)
PROGRESS NOTE    Jamie Wilson  [id number] DOB: [birth year] DOA: 11/02/2020 PCP: Patient, [name]    Chief Complaint  Patient presents with  . Pain  . Bladder Cancer    Brief Narrative:  Jamie Wilson is a 65 y.o.malewith medical history significant ofbladder CA w/ chronic foley placement; bowel obstruction s/p partial bowel resection w/ colostomy. Patient presented secondary to abdominal pain and found to have evidence of sepsis possibly secondary to necrotic bladder mass. Empiric antibiotics initiated. Urology and oncology consulted and recommendations given. Hospital course complicated by hypercalcemia. Bone scan does not show any lytic or blastic bone lesions suspicious for bone mets.AKI resolved. Still with some degree of hypercalcemia. He also had some GI symptoms including nausea, emesis and abdominal distention. KUB on 1/19 raise concern for SBO but GI symptoms resolved the next day.  Hospitalization further complicated by development of ESBL bacteremia.  He was started on IV Merrem.    Assessment & Plan:   Principal Problem:   ESBL (extended spectrum beta-lactamase) producing bacteria infection Active Problems:   Malnutrition (HCC)   Sepsis (HCC)   Protein-calorie malnutrition, severe   Lower urinary tract infectious disease   Palliative care by specialist   Goals of care, counseling/discussion  1 sepsis secondary to ESBL E. coli bacteremia Likely secondary to underlying urothelial carcinoma of the bladder/necrotic mass with chronic indwelling Foley catheter use. Patient noted to have sepsis on presentation, status post completion of 2 weeks of ciprofloxacin per urology recommendations. Repeat blood cultures concerning for ESBL E. coli bacteremia. Repeat blood cultures (11/25/2020) with no growth to date. Patient currently afebrile. Continue IV Merrem through 12/03/2020 to complete a 10-day course of treatment.  Discussed with ID.  2.  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 Was seen by urology. Outpatient follow-up by urology for radical cystectomy, Dr. Jeffie Pollock Outpatient follow-up with hematology/oncology 2 weeks post discharge with Dr.Kale.  3.  Hypercalcemia of malignancy Status post Zometa.  Patient underwent bone survey during this hospitalization which was negative for metastatic disease.  Calcium levels were initially trending down but started to trend back up. On IV fluids which will increase to 125 cc/h. Patient advised by Dr. Maylene Roes to stop dairy/milk products that contain calcium.  Follow.  4.  Acute kidney injury Resolved.  5.  Severe protein calorie malnutrition Dietitian following.  6.  Hypokalemia Replete.   DVT prophylaxis: SCDs Code Status: Full Family Communication: Updated patient.  No family at bedside. Disposition:   Status is: Inpatient    Dispo: The patient is from: Home              Anticipated d/c is to: SNF              Anticipated d/c date is: 12/05/2020              Patient currently with hypercalcemia, on IV antibiotics, not stable for discharge.   Difficult to place patient no       Consultants:   Wound care nurse  Palliative care: Dr. Rowe Pavy 11/05/2020  Hematology/oncology: Dr. Irene Limbo 11/04/2020  Urology: Dr. Abner Greenspan 11/03/2020  Procedures:   CT abdomen and pelvis 11/03/2020  Chest x-ray 11/03/2020  Plain films of bilateral feet 11/05/2020  Bone survey 11/11/2020  Abdominal films 11/16/2020, 11/22/2020  Antimicrobials:  Omnicef 11/05/2020>>>> 11/06/2020  Oral ciprofloxacin 11/06/2020>>>> 11/17/2020  IV Merrem 11/23/2020>>>> 12/03/2020   Subjective: Sleeping but arousable.  States he is doing the best  he can.  Denies any chest pain.  No shortness of breath.  No abdominal pain.  States he has a low appetite.  Objective: Vitals:   11/29/20 0549 11/29/20 1504 11/29/20 2145 11/30/20 0600  BP: 137/74 118/67 136/82 103/70  Pulse: (!) 102 95  (!) 102 98  Resp: 16 16 17 18   Temp:  97.8 F (36.6 C) 98.1 F (36.7 C) 98.1 F (36.7 C)  TempSrc:  Oral Oral   SpO2: 98% 98% 97% 96%  Weight:      Height:        Intake/Output Summary (Last 24 hours) at 11/30/2020 1221 Last data filed at 11/30/2020 4332 Gross per 24 hour  Intake 2440 ml  Output 2660 ml  Net -220 ml   Filed Weights   11/03/20 0852 11/23/20 1017 11/24/20 0508  Weight: 50.7 kg 59.9 kg 58.1 kg    Examination:  General exam: Frail.  Cachectic.  Chronically ill-appearing. Respiratory system: Clear to auscultation. Respiratory effort normal. Cardiovascular system: S1 & S2 heard, RRR. No JVD, murmurs, rubs, gallops or clicks. No pedal edema. Gastrointestinal system: Abdomen is nondistended, soft and nontender.  Ostomy bag intact.  No organomegaly or masses felt. Normal bowel sounds heard. Central nervous system: Alert and oriented. No focal neurological deficits. Extremities: Heel floaters. Skin: No rashes, lesions or ulcers Psychiatry: Judgement and insight appear normal. Mood & affect appropriate.     Data Reviewed: I have personally reviewed following labs and imaging studies  CBC: Recent Labs  Lab 11/26/20 0638 11/27/20 0603 11/28/20 0602 11/29/20 0508 11/30/20 0523  WBC 14.1* 18.3* 18.8* 18.7* 16.9*  HGB 8.3* 7.8* 8.6* 8.6* 8.3*  HCT 27.3* 25.8* 28.3* 28.7* 27.3*  MCV 89.8 91.8 91.6 91.4 91.0  PLT 429* 434* 468* 463* 437*    Basic Metabolic Panel: Recent Labs  Lab 11/26/20 0638 11/27/20 0603 11/28/20 0602 11/29/20 0508 11/30/20 0523  NA 137 137 137 139 136  K 2.6* 3.1* 3.9 4.1 3.4*  CL 109 111 111 110 107  CO2 19* 20* 20* 23 21*  GLUCOSE 110* 95 99 96 89  BUN 10 11 12 16 14   CREATININE 0.73 0.73 0.73 0.86 0.82  CALCIUM 10.2 10.8* 11.9* 12.8* 12.4*  MG 1.7 1.7  --   --  1.9    GFR: Estimated Creatinine Clearance: 73.8 mL/min (by C-G formula based on SCr of 0.82 mg/dL).  Liver Function Tests: No results for input(s): AST, ALT,  ALKPHOS, BILITOT, PROT, ALBUMIN in the last 168 hours.  CBG: Recent Labs  Lab 11/25/20 0648 11/26/20 0411 11/28/20 0521 11/30/20 0640  GLUCAP 118* 116* 97 92     Recent Results (from the past 240 hour(s))  Culture, blood (routine x 2)     Status: Abnormal   Collection Time: 11/22/20  2:59 PM   Specimen: BLOOD  Result Value Ref Range Status   Specimen Description   Final    BLOOD LEFT ANTECUBITAL Performed at [facility] [address], [place], [place] [address]    Special Requests   Final    BOTTLES DRAWN AEROBIC ONLY Blood Culture adequate volume Performed at [facility] [address] [place],  [address]    Culture  Setup Time   Final    GRAM NEGATIVE RODS AEROBIC BOTTLE ONLY CRITICAL VALUE NOTED.  VALUE IS CONSISTENT WITH PREVIOUSLY REPORTED AND CALLED VALUE.    Culture (A)  Final    ESCHERICHIA COLI SUSCEPTIBILITIES PERFORMED ON PREVIOUS CULTURE WITHIN THE LAST  5 DAYS. Performed at [facility], [place] [address], [place], [place] [address]    Report Status 11/26/2020 FINAL  Final  Culture, blood (routine x 2)     Status: Abnormal   Collection Time: 11/22/20  2:59 PM   Specimen: BLOOD LEFT HAND  Result Value Ref Range Status   Specimen Description   Final    BLOOD LEFT HAND Performed at [facility] [address], [place], [place] [address]    Special Requests   Final    BOTTLES DRAWN AEROBIC ONLY Blood Culture adequate volume Performed at [facility] [address], [place], [place] [address]    Culture  Setup Time   Final    GRAM NEGATIVE RODS AEROBIC BOTTLE ONLY CRITICAL RESULT CALLED TO, READ BACK BY AND VERIFIED WITH: Ulice Bold PharmD 9:25 11/23/20 ([email or web address]) Performed at [facility], [place] [address] [place], [place] [address]    Culture (A)  Final    ESCHERICHIA COLI Confirmed Extended Spectrum Beta-Lactamase Producer (ESBL).  In bloodstream infections  from ESBL organisms, carbapenems are preferred over piperacillin/tazobactam. They are shown to have a lower risk of mortality.    Report Status 11/26/2020 FINAL  Final   Organism ID, Bacteria ESCHERICHIA COLI  Final      Susceptibility   Escherichia coli - MIC*    AMPICILLIN >=32 RESISTANT Resistant     CEFAZOLIN >=64 RESISTANT Resistant     CEFEPIME >=32 RESISTANT Resistant     CEFTAZIDIME 32 RESISTANT Resistant     CEFTRIAXONE >=64 RESISTANT Resistant     CIPROFLOXACIN >=4 RESISTANT Resistant     GENTAMICIN <=1 SENSITIVE Sensitive     IMIPENEM <=0.25 SENSITIVE Sensitive     TRIMETH/SULFA <=20 SENSITIVE Sensitive     AMPICILLIN/SULBACTAM >=32 RESISTANT Resistant     * ESCHERICHIA COLI  Blood Culture ID Panel (Reflexed)     Status: Abnormal   Collection Time: 11/22/20  2:59 PM  Result Value Ref Range Status   Enterococcus faecalis NOT DETECTED NOT DETECTED Final   Enterococcus Faecium NOT DETECTED NOT DETECTED Final   Listeria monocytogenes NOT DETECTED NOT DETECTED Final   Staphylococcus species NOT DETECTED NOT DETECTED Final   Staphylococcus aureus (BCID) NOT DETECTED NOT DETECTED Final   Staphylococcus epidermidis NOT DETECTED NOT DETECTED Final   Staphylococcus lugdunensis NOT DETECTED NOT DETECTED Final   Streptococcus species NOT DETECTED NOT DETECTED Final   Streptococcus agalactiae NOT DETECTED NOT DETECTED Final   Streptococcus pneumoniae NOT DETECTED NOT DETECTED Final   Streptococcus pyogenes NOT DETECTED NOT DETECTED Final   A.calcoaceticus-baumannii NOT DETECTED NOT DETECTED Final   Bacteroides fragilis NOT DETECTED NOT DETECTED Final   Enterobacterales DETECTED (A) NOT DETECTED Final    Comment: Enterobacterales represent a large order of gram negative bacteria, not a single organism. CRITICAL RESULT CALLED TO, READ BACK BY AND VERIFIED WITH: Ulice Bold PharmD 9:25 11/23/20 (wilsonm)    Enterobacter cloacae complex NOT DETECTED NOT DETECTED Final   Escherichia coli  DETECTED (A) NOT DETECTED Final    Comment: CRITICAL RESULT CALLED TO, READ BACK BY AND VERIFIED WITH: Ulice Bold PharmD 9:25 11/23/20 (wilsonm)    Klebsiella aerogenes NOT DETECTED NOT DETECTED Final   Klebsiella oxytoca NOT DETECTED NOT DETECTED Final   Klebsiella pneumoniae NOT DETECTED NOT DETECTED Final   Proteus species NOT DETECTED NOT DETECTED Final   Salmonella species NOT DETECTED NOT DETECTED Final   Serratia marcescens NOT DETECTED NOT DETECTED Final  Haemophilus influenzae NOT DETECTED NOT DETECTED Final   Neisseria meningitidis NOT DETECTED NOT DETECTED Final   Pseudomonas aeruginosa NOT DETECTED NOT DETECTED Final   Stenotrophomonas maltophilia NOT DETECTED NOT DETECTED Final   Candida albicans NOT DETECTED NOT DETECTED Final   Candida auris NOT DETECTED NOT DETECTED Final   Candida glabrata NOT DETECTED NOT DETECTED Final   Candida krusei NOT DETECTED NOT DETECTED Final   Candida parapsilosis NOT DETECTED NOT DETECTED Final   Candida tropicalis NOT DETECTED NOT DETECTED Final   Cryptococcus neoformans/gattii NOT DETECTED NOT DETECTED Final   CTX-M ESBL DETECTED (A) NOT DETECTED Final    Comment: CRITICAL RESULT CALLED TO, READ BACK BY AND VERIFIED WITH: Ulice Bold PharmD 9:25 11/23/20 (wilsonm) (NOTE) Extended spectrum beta-lactamase detected. Recommend a carbapenem as initial therapy.      Carbapenem resistance IMP NOT DETECTED NOT DETECTED Final   Carbapenem resistance KPC NOT DETECTED NOT DETECTED Final   Carbapenem resistance NDM NOT DETECTED NOT DETECTED Final   Carbapenem resist OXA 48 LIKE NOT DETECTED NOT DETECTED Final   Carbapenem resistance VIM NOT DETECTED NOT DETECTED Final    Comment: Performed at [facility], [place] [address], [place], [place] [address]  Culture, blood (routine x 2)     Status: None   Collection Time: 11/25/20 11:04 AM   Specimen: BLOOD  Result Value Ref Range Status   Specimen Description   Final    BLOOD SITE NOT  SPECIFIED Performed at [facility] [address], [place], [place] [address]    Special Requests   Final    BOTTLES DRAWN AEROBIC ONLY Blood Culture adequate volume Performed at [facility] [address], [place], [place] [address]    Culture   Final    NO GROWTH 5 DAYS Performed at [facility], [place] [address], [place], [place] [address]    Report Status 11/30/2020 FINAL  Final  Culture, blood (routine x 2)     Status: None   Collection Time: 11/25/20 11:04 AM   Specimen: BLOOD  Result Value Ref Range Status   Specimen Description   Final    BLOOD SITE NOT SPECIFIED Performed at [facility] [address] [place], [place] [address]    Special Requests   Final    BOTTLES DRAWN AEROBIC ONLY Blood Culture adequate volume Performed at [facility] [address], [place], [place] [address]    Culture   Final    NO GROWTH 5 DAYS Performed at [facility], [place] [address] [place],  [address]    Report Status 11/30/2020 FINAL  Final         Radiology Studies: No results found.      Scheduled Meds: . (feeding supplement) PROSource Plus  30 mL Oral TID BM  . B-complex with vitamin C  1 tablet Oral Daily  . Chlorhexidine Gluconate Cloth  6 each Topical Daily  . feeding supplement  1 Container Oral TID BM  . folic acid  1 mg Oral Daily  . hydrocerin   Topical BID  . nicotine  14 mg Transdermal Daily  . senna-docusate  1 tablet Oral BID  . tamsulosin  0.4 mg Oral Nightly  . cyanocobalamin  1,000 mcg Oral Daily   Continuous Infusions: . sodium chloride 125 mL/hr at 11/30/20 0949  . meropenem (MERREM) IV 1 g (11/30/20 1101)  . potassium chloride       LOS:  27 days    Time spent: 35 minutes    Irine Seal, MD [facility]   To contact the attending provider between 7A-7P or the covering provider during after hours 7P-7A, please log into the web  site [email or web address] and access using universal [place] password for that web site. If you do not have the password, please call the hospital operator.  11/30/2020, 12:21 PM

## 2020-11-30 NOTE — Progress Notes (Addendum)
Nutrition Follow-up  DOCUMENTATION CODES:   Severe malnutrition in context of chronic illness,Underweight  INTERVENTION:   Resume low to no calcium supplements given severe malnutrition  -Boost Breeze po TID, each supplement provides 250 kcal and 9 grams of protein Contains 10 mg of calcium per bottle (30 mg total per day)-very low  -Prosource Plus PO TID, each provides 100 kcals and 15g protein Contains 0 mg calcium per supplement  NUTRITION DIAGNOSIS:   Severe Malnutrition related to chronic illness,cancer and cancer related treatments as evidenced by severe fat depletion,severe muscle depletion,percent weight loss.  Ongoing.  GOAL:   Patient will meet greater than or equal to 90% of their needs  Not meeting.  MONITOR:   PO intake,Supplement acceptance,Labs,I & O's,Weight trends  ASSESSMENT:   65 y.o. 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 Patient presented secondary to abdominal pain and found to have evidence of sepsis possibly secondary to necrotic bladder mass.  Per chart review, pt's supplements were d/c d/t concerns for elevating Ca. Ensure does contain 330 mg of Ca so this can be stopped. Boost Breeze and Prosource Plus, however, contain 0- very minimal amounts of calcium. Would continue these as they provide kcals and protein to help pt's malnutrition. Plus, pt has been refusing meals as of late. Pt has been educated on calcium foods previously.   Admission weight: 111 lbs.  Current weight: 128 lbs.  I/Os: -9.2L since 1/19 UOP: 3.3L x 24 hrs  Medications: B complex with Vitamin C, Folic acid,  KLOR-CON, Senokot, Vitamin B-12  Labs reviewed: CBGs: 92-97 Low K Ca 12.4  Diet Order:   Diet Order            DIET SOFT Room service appropriate? Yes; Fluid consistency: Thin  Diet effective now                 EDUCATION NEEDS:   No education needs have  been identified at this time  Skin:  Skin Assessment: Reviewed RN Assessment  Last BM:  2/1 -type 7  Height:   Ht Readings from Last 1 Encounters:  11/02/20 6\' 1"  (1.854 m)    Weight:   Wt Readings from Last 1 Encounters:  11/24/20 58.1 kg   BMI:  Body mass index is 16.9 kg/m.  Estimated Nutritional Needs:   Kcal:  2100-2300  Protein:  100-115g  Fluid:  2.1L/day  Clayton Bibles, MS, RD, LDN Inpatient Clinical Dietitian Contact information available via Amion

## 2020-12-01 DIAGNOSIS — E43 Unspecified severe protein-calorie malnutrition: Secondary | ICD-10-CM | POA: Diagnosis not present

## 2020-12-01 DIAGNOSIS — A499 Bacterial infection, unspecified: Secondary | ICD-10-CM | POA: Diagnosis not present

## 2020-12-01 DIAGNOSIS — C679 Malignant neoplasm of bladder, unspecified: Secondary | ICD-10-CM | POA: Diagnosis not present

## 2020-12-01 LAB — CBC WITH DIFFERENTIAL/PLATELET
Abs Immature Granulocytes: 0.05 10*3/uL (ref 0.00–0.07)
Basophils Absolute: 0.1 10*3/uL (ref 0.0–0.1)
Basophils Relative: 1 %
Eosinophils Absolute: 8.2 10*3/uL — ABNORMAL HIGH (ref 0.0–0.5)
Eosinophils Relative: 45 %
HCT: 27.4 % — ABNORMAL LOW (ref 39.0–52.0)
Hemoglobin: 8.4 g/dL — ABNORMAL LOW (ref 13.0–17.0)
Immature Granulocytes: 0 %
Lymphocytes Relative: 7 %
Lymphs Abs: 1.3 10*3/uL (ref 0.7–4.0)
MCH: 27.5 pg (ref 26.0–34.0)
MCHC: 30.7 g/dL (ref 30.0–36.0)
MCV: 89.5 fL (ref 80.0–100.0)
Monocytes Absolute: 0.7 10*3/uL (ref 0.1–1.0)
Monocytes Relative: 4 %
Neutro Abs: 7.7 10*3/uL (ref 1.7–7.7)
Neutrophils Relative %: 43 %
Platelets: 433 10*3/uL — ABNORMAL HIGH (ref 150–400)
RBC: 3.06 MIL/uL — ABNORMAL LOW (ref 4.22–5.81)
RDW: 15.7 % — ABNORMAL HIGH (ref 11.5–15.5)
WBC: 18.1 10*3/uL — ABNORMAL HIGH (ref 4.0–10.5)
nRBC: 0 % (ref 0.0–0.2)

## 2020-12-01 LAB — RENAL FUNCTION PANEL
Albumin: 1.9 g/dL — ABNORMAL LOW (ref 3.5–5.0)
Anion gap: 7 (ref 5–15)
BUN: 16 mg/dL (ref 8–23)
CO2: 21 mmol/L — ABNORMAL LOW (ref 22–32)
Calcium: 12.4 mg/dL — ABNORMAL HIGH (ref 8.9–10.3)
Chloride: 106 mmol/L (ref 98–111)
Creatinine, Ser: 0.84 mg/dL (ref 0.61–1.24)
GFR, Estimated: >60 mL/min (ref >60)
Glucose, Bld: 86 mg/dL (ref 70–99)
Phosphorus: 2.3 mg/dL — ABNORMAL LOW (ref 2.5–4.6)
Potassium: 3.6 mmol/L (ref 3.5–5.1)
Sodium: 134 mmol/L — ABNORMAL LOW (ref 135–145)

## 2020-12-01 LAB — GLUCOSE, CAPILLARY: Glucose-Capillary: 80 mg/dL (ref 70–99)

## 2020-12-01 LAB — MAGNESIUM: Magnesium: 1.7 mg/dL (ref 1.7–2.4)

## 2020-12-01 MED ORDER — POTASSIUM CHLORIDE CRYS ER 20 MEQ PO TBCR
40.0000 meq | EXTENDED_RELEASE_TABLET | Freq: Once | ORAL | Status: DC
  Filled 2020-12-01 (×2): qty 2

## 2020-12-01 MED ORDER — MAGNESIUM SULFATE 2 GM/50ML IV SOLN
2.0000 g | Freq: Once | INTRAVENOUS | Status: AC
  Administered 2020-12-01: 09:00:00 2 g via INTRAVENOUS
  Filled 2020-12-01: qty 50

## 2020-12-01 MED ORDER — FUROSEMIDE 10 MG/ML IJ SOLN
40.0000 mg | Freq: Once | INTRAMUSCULAR | Status: AC
  Administered 2020-12-01: 09:00:00 40 mg via INTRAVENOUS
  Filled 2020-12-01: qty 4

## 2020-12-01 MED ORDER — POTASSIUM PHOSPHATES 15 MMOLE/5ML IV SOLN
30.0000 mmol | Freq: Once | INTRAVENOUS | Status: AC
  Administered 2020-12-01: 11:00:00 30 mmol via INTRAVENOUS
  Filled 2020-12-01: qty 10

## 2020-12-01 NOTE — Progress Notes (Signed)
PROGRESS NOTE    Jamie Wilson  HKV:[id number] DOB: 09/09/1956 DOA: 11/02/2020 PCP: Patient, [name]    Chief Complaint  Patient presents with  . Pain  . Bladder Cancer    Brief Narrative:  Jamie Wilson is a 65 y.o.malewith medical history significant ofbladder CA w/ chronic foley placement; bowel obstruction s/p partial bowel resection w/ colostomy. Patient presented secondary to abdominal pain and found to have evidence of sepsis possibly secondary to necrotic bladder mass. Empiric antibiotics initiated. Urology and oncology consulted and recommendations given. Hospital course complicated by hypercalcemia. Bone scan does not show any lytic or blastic bone lesions suspicious for bone mets.AKI resolved. Still with some degree of hypercalcemia. He also had some GI symptoms including nausea, emesis and abdominal distention. KUB on 1/19 raise concern for SBO but GI symptoms resolved the next day.  Hospitalization further complicated by development of ESBL bacteremia.  He was started on IV Merrem.    Assessment & Plan:   Principal Problem:   ESBL (extended spectrum beta-lactamase) producing bacteria infection Active Problems:   Malnutrition (HCC)   Sepsis (HCC)   Protein-calorie malnutrition, severe   Lower urinary tract infectious disease   Palliative care by specialist   Goals of care, counseling/discussion  1 sepsis secondary to ESBL E. coli bacteremia Likely secondary to underlying urothelial carcinoma of the bladder/necrotic mass with chronic indwelling Foley catheter use. Patient noted to have sepsis on presentation, status post completion of 2 weeks of ciprofloxacin per urology recommendations. Repeat blood cultures concerning for ESBL E. coli bacteremia. Repeat blood cultures (11/25/2020) with no growth to date. Patient currently afebrile. Continue IV Merrem through 12/03/2020 to complete a 10-day course of treatment.  Discussed with ID.  2.  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 Was seen by urology. Outpatient follow-up by urology for radical cystectomy, Dr. Jeffie Pollock Outpatient follow-up with hematology/oncology 2 weeks post discharge with Dr.Kale.  3.  Hypercalcemia of malignancy Status post Zometa.  Patient underwent bone survey during this hospitalization which was negative for metastatic disease.  Calcium levels were initially trending down but started to trend back up.  Corrected calcium at 14.08.  Continue IV fluids.  We will give a dose of Lasix 40 mg IV x1.  Monitor urine output.  If no significant improvement may need some calcitonin. Patient advised by Dr. Maylene Roes to stop dairy/milk products that contain calcium.  Follow.  4.  Acute kidney injury Resolved.  5.  Severe protein calorie malnutrition Dietitian following.  6.  Hypokalemia Potassium at 3.6.  We will give K. Dur 40 mEq p.o. x1.   DVT prophylaxis: SCDs Code Status: Full Family Communication: Updated patient.  No family at bedside. Disposition:   Status is: Inpatient    Dispo: The patient is from: Home              Anticipated d/c is to: SNF              Anticipated d/c date is: 12/05/2020              Patient currently with hypercalcemia, on IV antibiotics, not stable for discharge.   Difficult to place patient no       Consultants:   Wound care nurse  Palliative care: Dr. Rowe Pavy 11/05/2020  Hematology/oncology: Dr. Irene Limbo 11/04/2020  Urology: Dr. Abner Greenspan 11/03/2020  Procedures:   CT abdomen and pelvis 11/03/2020  Chest x-ray 11/03/2020  Plain films of bilateral feet 11/05/2020  Bone survey 11/11/2020  Abdominal films 11/16/2020, 11/22/2020  Antimicrobials:  Omnicef 11/05/2020>>>> 11/06/2020  Oral ciprofloxacin 11/06/2020>>>> 11/17/2020  IV Merrem 11/23/2020>>>> 12/03/2020   Subjective: Sitting up in chair.  Denies any chest pain or shortness of breath no abdominal pain.  Still with poor low appetite.     Objective: Vitals:   11/30/20 0600 11/30/20 1350 11/30/20 2155 12/01/20 0540  BP: 103/70 120/66 137/73 131/79  Pulse: 98 82 94 100  Resp: 18 16 17 18   Temp: 98.1 F (36.7 C) 98.4 F (36.9 C) 97.8 F (36.6 C) 98.9 F (37.2 C)  TempSrc:  Oral Oral Oral  SpO2: 96% 99% 96% 96%  Weight:      Height:        Intake/Output Summary (Last 24 hours) at 12/01/2020 1230 Last data filed at 12/01/2020 1049 Gross per 24 hour  Intake 1097.44 ml  Output 3950 ml  Net -2852.56 ml   Filed Weights   11/03/20 0852 11/23/20 1017 11/24/20 0508  Weight: 50.7 kg 59.9 kg 58.1 kg    Examination:  General exam: Cachectic.  Frail.  Chronically ill-appearing.  Respiratory system: CTAB.  No wheezes, no crackles, no rhonchi.  Normal respiratory effort.  Cardiovascular system: Regular rate rhythm no murmurs rubs or gallops.  No JVD.  No lower extremity edema.  Gastrointestinal system: Abdomen is soft, nontender, nondistended, positive bowel sounds.  Ostomy bag intact. Central nervous system: Alert and oriented. No focal neurological deficits. Extremities: No clubbing cyanosis or edema. Skin: No rashes, lesions or ulcers Psychiatry: Judgement and insight appear normal. Mood & affect appropriate.     Data Reviewed: I have personally reviewed following labs and imaging studies  CBC: Recent Labs  Lab 11/27/20 0603 11/28/20 0602 11/29/20 0508 11/30/20 0523 12/01/20 0324  WBC 18.3* 18.8* 18.7* 16.9* 18.1*  NEUTROABS  --   --   --   --  7.7  HGB 7.8* 8.6* 8.6* 8.3* 8.4*  HCT 25.8* 28.3* 28.7* 27.3* 27.4*  MCV 91.8 91.6 91.4 91.0 89.5  PLT 434* 468* 463* 437* 433*    Basic Metabolic Panel: Recent Labs  Lab 11/26/20 0638 11/27/20 0603 11/28/20 0602 11/29/20 0508 11/30/20 0523 12/01/20 0324  NA 137 137 137 139 136 134*  K 2.6* 3.1* 3.9 4.1 3.4* 3.6  CL 109 111 111 110 107 106  CO2 19* 20* 20* 23 21* 21*  GLUCOSE 110* 95 99 96 89 86  BUN 10 11 12 16 14 16   CREATININE 0.73 0.73 0.73 0.86  0.82 0.84  CALCIUM 10.2 10.8* 11.9* 12.8* 12.4* 12.4*  MG 1.7 1.7  --   --  1.9 1.7  PHOS  --   --   --   --   --  2.3*    GFR: Estimated Creatinine Clearance: 72 mL/min (by C-G formula based on SCr of 0.84 mg/dL).  Liver Function Tests: Recent Labs  Lab 12/01/20 0324  ALBUMIN 1.9*    CBG: Recent Labs  Lab 11/25/20 0648 11/26/20 0411 11/28/20 0521 11/30/20 0640 12/01/20 0442  GLUCAP 118* 116* 97 92 80     Recent Results (from the past 240 hour(s))  Culture, blood (routine x 2)     Status: Abnormal   Collection Time: 11/22/20  2:59 PM   Specimen: BLOOD  Result Value Ref Range Status   Specimen Description   Final    BLOOD LEFT ANTECUBITAL Performed at [facility] [address] [place], [place] [address]    Special Requests  Final    BOTTLES DRAWN AEROBIC ONLY Blood Culture adequate volume Performed at [facility] [address], [place], [place] [address]    Culture  Setup Time   Final    GRAM NEGATIVE RODS AEROBIC BOTTLE ONLY CRITICAL VALUE NOTED.  VALUE IS CONSISTENT WITH PREVIOUSLY REPORTED AND CALLED VALUE.    Culture (A)  Final    ESCHERICHIA COLI SUSCEPTIBILITIES PERFORMED ON PREVIOUS CULTURE WITHIN THE LAST 5 DAYS. Performed at [facility], [place] [address], [place], [place] [address]    Report Status 11/26/2020 FINAL  Final  Culture, blood (routine x 2)     Status: Abnormal   Collection Time: 11/22/20  2:59 PM   Specimen: BLOOD LEFT HAND  Result Value Ref Range Status   Specimen Description   Final    BLOOD LEFT HAND Performed at [facility] [address], [place], [place] [address]    Special Requests   Final    BOTTLES DRAWN AEROBIC ONLY Blood Culture adequate volume Performed at [facility] [address] [place], [place] [address]    Culture  Setup Time   Final    GRAM NEGATIVE RODS AEROBIC BOTTLE ONLY CRITICAL RESULT CALLED TO, READ BACK BY AND  VERIFIED WITH: Ulice Bold PharmD 9:25 11/23/20 ([email or web address]) Performed at [facility], [place] [address] [place], [place] [address]    Culture (A)  Final    ESCHERICHIA COLI Confirmed Extended Spectrum Beta-Lactamase Producer (ESBL).  In bloodstream infections from ESBL organisms, carbapenems are preferred over piperacillin/tazobactam. They are shown to have a lower risk of mortality.    Report Status 11/26/2020 FINAL  Final   Organism ID, Bacteria ESCHERICHIA COLI  Final      Susceptibility   Escherichia coli - MIC*    AMPICILLIN >=32 RESISTANT Resistant     CEFAZOLIN >=64 RESISTANT Resistant     CEFEPIME >=32 RESISTANT Resistant     CEFTAZIDIME 32 RESISTANT Resistant     CEFTRIAXONE >=64 RESISTANT Resistant     CIPROFLOXACIN >=4 RESISTANT Resistant     GENTAMICIN <=1 SENSITIVE Sensitive     IMIPENEM <=0.25 SENSITIVE Sensitive     TRIMETH/SULFA <=20 SENSITIVE Sensitive     AMPICILLIN/SULBACTAM >=32 RESISTANT Resistant     * ESCHERICHIA COLI  Blood Culture ID Panel (Reflexed)     Status: Abnormal   Collection Time: 11/22/20  2:59 PM  Result Value Ref Range Status   Enterococcus faecalis NOT DETECTED NOT DETECTED Final   Enterococcus Faecium NOT DETECTED NOT DETECTED Final   Listeria monocytogenes NOT DETECTED NOT DETECTED Final   Staphylococcus species NOT DETECTED NOT DETECTED Final   Staphylococcus aureus (BCID) NOT DETECTED NOT DETECTED Final   Staphylococcus epidermidis NOT DETECTED NOT DETECTED Final   Staphylococcus lugdunensis NOT DETECTED NOT DETECTED Final   Streptococcus species NOT DETECTED NOT DETECTED Final   Streptococcus agalactiae NOT DETECTED NOT DETECTED Final   Streptococcus pneumoniae NOT DETECTED NOT DETECTED Final   Streptococcus pyogenes NOT DETECTED NOT DETECTED Final   A.calcoaceticus-baumannii NOT DETECTED NOT DETECTED Final   Bacteroides fragilis NOT DETECTED NOT DETECTED Final   Enterobacterales DETECTED (A) NOT DETECTED Final    Comment:  Enterobacterales represent a large order of gram negative bacteria, not a single organism. CRITICAL RESULT CALLED TO, READ BACK BY AND VERIFIED WITH: Ulice Bold PharmD 9:25 11/23/20 (wilsonm)    Enterobacter cloacae complex NOT DETECTED NOT DETECTED Final   Escherichia coli DETECTED (A) NOT DETECTED  Final    Comment: CRITICAL RESULT CALLED TO, READ BACK BY AND VERIFIED WITH: Ulice Bold PharmD 9:25 11/23/20 (wilsonm)    Klebsiella aerogenes NOT DETECTED NOT DETECTED Final   Klebsiella oxytoca NOT DETECTED NOT DETECTED Final   Klebsiella pneumoniae NOT DETECTED NOT DETECTED Final   Proteus species NOT DETECTED NOT DETECTED Final   Salmonella species NOT DETECTED NOT DETECTED Final   Serratia marcescens NOT DETECTED NOT DETECTED Final   Haemophilus influenzae NOT DETECTED NOT DETECTED Final   Neisseria meningitidis NOT DETECTED NOT DETECTED Final   Pseudomonas aeruginosa NOT DETECTED NOT DETECTED Final   Stenotrophomonas maltophilia NOT DETECTED NOT DETECTED Final   Candida albicans NOT DETECTED NOT DETECTED Final   Candida auris NOT DETECTED NOT DETECTED Final   Candida glabrata NOT DETECTED NOT DETECTED Final   Candida krusei NOT DETECTED NOT DETECTED Final   Candida parapsilosis NOT DETECTED NOT DETECTED Final   Candida tropicalis NOT DETECTED NOT DETECTED Final   Cryptococcus neoformans/gattii NOT DETECTED NOT DETECTED Final   CTX-M ESBL DETECTED (A) NOT DETECTED Final    Comment: CRITICAL RESULT CALLED TO, READ BACK BY AND VERIFIED WITH: Ulice Bold PharmD 9:25 11/23/20 (wilsonm) (NOTE) Extended spectrum beta-lactamase detected. Recommend a carbapenem as initial therapy.      Carbapenem resistance IMP NOT DETECTED NOT DETECTED Final   Carbapenem resistance KPC NOT DETECTED NOT DETECTED Final   Carbapenem resistance NDM NOT DETECTED NOT DETECTED Final   Carbapenem resist OXA 48 LIKE NOT DETECTED NOT DETECTED Final   Carbapenem resistance VIM NOT DETECTED NOT DETECTED Final    Comment:  Performed at [facility], [place] [address], [place], [place] [address]  Culture, blood (routine x 2)     Status: None   Collection Time: 11/25/20 11:04 AM   Specimen: BLOOD  Result Value Ref Range Status   Specimen Description   Final    BLOOD SITE NOT SPECIFIED Performed at [facility] [address], [place], [place] [address]    Special Requests   Final    BOTTLES DRAWN AEROBIC ONLY Blood Culture adequate volume Performed at [facility] [address], [place], [place] [address]    Culture   Final    NO GROWTH 5 DAYS Performed at [facility], [place] [address] [place], [place] [address]    Report Status 11/30/2020 FINAL  Final  Culture, blood (routine x 2)     Status: None   Collection Time: 11/25/20 11:04 AM   Specimen: BLOOD  Result Value Ref Range Status   Specimen Description   Final    BLOOD SITE NOT SPECIFIED Performed at [facility] [address], [place], [place] [address]    Special Requests   Final    BOTTLES DRAWN AEROBIC ONLY Blood Culture adequate volume Performed at [facility] [address], [place],  [address]    Culture   Final    NO GROWTH 5 DAYS Performed at [facility], [place] [address], [place],  [address]    Report Status 11/30/2020 FINAL  Final         Radiology Studies: No results found.      Scheduled Meds: . (feeding supplement) PROSource Plus  30 mL Oral TID BM  . B-complex with vitamin C  1 tablet Oral Daily  . Chlorhexidine Gluconate Cloth  6 each Topical Daily  . feeding supplement  1 Container Oral TID BM  .  folic acid  1 mg Oral Daily  . hydrocerin   Topical BID  . nicotine  14 mg Transdermal Daily  . potassium chloride  40 mEq Oral Once  . senna-docusate  1 tablet Oral BID  . tamsulosin  0.4 mg Oral Nightly  . cyanocobalamin  1,000 mcg Oral Daily   Continuous Infusions: . sodium chloride 125 mL/hr at  12/01/20 0452  . meropenem (MERREM) IV 1 g (12/01/20 0933)  . potassium PHOSPHATE IVPB (in mmol) 30 mmol (12/01/20 1103)     LOS: 28 days    Time spent: 35 minutes    Irine Seal, MD [facility]   To contact the attending provider between 7A-7P or the covering provider during after hours 7P-7A, please log into the web site [email or web address] and access using universal [place] password for that web site. If you do not have the password, please call the hospital operator.  12/01/2020, 12:30 PM

## 2020-12-01 NOTE — Progress Notes (Signed)
Physical Therapy Treatment Patient Details Name: Jamie Wilson MRN: [id number] DOB: 1956-08-26 Today's Date: 12/01/2020    History of Present Illness 65 y.o. 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 Patient presented secondary to abdominal pain and found to have evidence of sepsis possibly secondary to necrotic bladder mass. Empiric antibiotics initiated. Urology and oncology consulted.    PT Comments    Assisted OOB to amb required increased time and max encouragement.  General bed mobility comments: increased time pt self able.  General transfer comment: pt required MIN assist for safety/steadying with sit to stand from elevated EOB and MIN guard for rise from elevated bed.  General Gait Details: assisted with amb in hallway with recliner following.  25% VC's on proper walker to self distance and upright posture.  Limited distance due to fatigue/weakness.  Positioned in recliner with multiple pillows as pt is underweight and boney.   Follow Up Recommendations  SNF     Equipment Recommendations  None recommended by PT    Recommendations for Other Services       Precautions / Restrictions Precautions Precautions: Fall Precaution Comments: colostomy    Mobility  Bed Mobility Overal bed mobility: Modified Independent;Needs Assistance Bed Mobility: Supine to Sit     Supine to sit: Supervision;HOB elevated     General bed mobility comments: increased time pt self able  Transfers Overall transfer level: Needs assistance Equipment used: Rolling walker (2 wheeled) Transfers: Sit to/from Stand Sit to Stand: From elevated surface Stand pivot transfers: Min guard;Min assist       General transfer comment: pt required MIN assist for safety/steadying with sit to stand from elevated EOB and MIN guard for rise from elevated bed  Ambulation/Gait Ambulation/Gait assistance: Min  assist;Min guard Gait Distance (Feet): 18 Feet Assistive device: Rolling walker (2 wheeled) Gait Pattern/deviations: Step-through pattern;Decreased stride length;Trunk flexed Gait velocity: decreased   General Gait Details: assisted with amb in hallway with recliner following.  25% VC's on proper walker to self distance and upright posture.  Limited distance due to fatigue/weakness   Stairs             Wheelchair Mobility    Modified Rankin (Stroke Patients Only)       Balance                                            Cognition Arousal/Alertness: Awake/alert Behavior During Therapy: WFL for tasks assessed/performed Overall Cognitive Status: Within Functional Limits for tasks assessed                                 General Comments: AxO x 3 loves to complain and required MAX encouragement to get OOB      Exercises      General Comments        Pertinent Vitals/Pain Pain Assessment: Faces Faces Pain Scale: Hurts a little bit Pain Location: Bilat feet/toes "bad feet" Pain Descriptors / Indicators: Discomfort;Grimacing Pain Intervention(s): Monitored during session    Home Living                      Prior Function            PT Goals (current goals can now be  found in the care plan section) Progress towards PT goals: Progressing toward goals    Frequency    Min 2X/week      PT Plan Current plan remains appropriate    Co-evaluation              AM-PAC PT "6 Clicks" Mobility   Outcome Measure  Help needed turning from your back to your side while in a flat bed without using bedrails?: None Help needed moving from lying on your back to sitting on the side of a flat bed without using bedrails?: None Help needed moving to and from a bed to a chair (including a wheelchair)?: A Little Help needed standing up from a chair using your arms (e.g., wheelchair or bedside chair)?: A Little Help needed to  walk in hospital room?: A Little Help needed climbing 3-5 steps with a railing? : A Lot 6 Click Score: 19    End of Session Equipment Utilized During Treatment: Gait belt Activity Tolerance: Patient limited by fatigue Patient left: in chair;with call bell/phone within reach;with chair alarm set Nurse Communication: Mobility status PT Visit Diagnosis: Other abnormalities of gait and mobility (R26.89);Muscle weakness (generalized) (M62.81)     Time: 5035-4656 PT Time Calculation (min) (ACUTE ONLY): 30 min  Charges:  $Gait Training: 8-22 mins $Therapeutic Activity: 8-22 mins                     Rica Koyanagi  PTA Acute  Rehabilitation Services Pager      [phone] Office      [phone]

## 2020-12-01 NOTE — TOC Progression Note (Signed)
Transition of Care [facility]) - Progression Note    Patient Details  Name: Jamie Wilson MRN: [id number] Date of Birth: 30-Nov-1955  Transition of Care [facility]) CM/SW [place], [place] Phone Number: 12/01/2020, 11:09 AM  Clinical Narrative:    CSW updated SNF staff Mist[facility]. Anticipate D/C Monday 2/7 (SNF will not accept the weekend). Patient will need a covid test ordered the day before d/c.  Expected Discharge Plan: Skilled Nursing Facility Barriers to Discharge: Continued Medical Work up  Expected Discharge Plan and Services Expected Discharge Plan: [place]         Expected Discharge Date:  (unknown)                                     Social Determinants of Health (SDOH) Interventions    Readmission Risk Interventions Readmission Risk Prevention Plan 10/04/2020  Transportation Screening Complete  PCP or Specialist Appt within 5-7 Days Complete  Home Care Screening Complete  Medication Review (RN CM) Complete

## 2020-12-02 DIAGNOSIS — E43 Unspecified severe protein-calorie malnutrition: Secondary | ICD-10-CM | POA: Diagnosis not present

## 2020-12-02 DIAGNOSIS — A499 Bacterial infection, unspecified: Secondary | ICD-10-CM | POA: Diagnosis not present

## 2020-12-02 DIAGNOSIS — C679 Malignant neoplasm of bladder, unspecified: Secondary | ICD-10-CM | POA: Diagnosis not present

## 2020-12-02 LAB — CBC WITH DIFFERENTIAL/PLATELET
Abs Immature Granulocytes: 0.07 10*3/uL (ref 0.00–0.07)
Basophils Absolute: 0.1 10*3/uL (ref 0.0–0.1)
Basophils Relative: 1 %
Eosinophils Absolute: 7.5 10*3/uL — ABNORMAL HIGH (ref 0.0–0.5)
Eosinophils Relative: 40 %
HCT: 27.5 % — ABNORMAL LOW (ref 39.0–52.0)
Hemoglobin: 8.5 g/dL — ABNORMAL LOW (ref 13.0–17.0)
Immature Granulocytes: 0 %
Lymphocytes Relative: 7 %
Lymphs Abs: 1.2 10*3/uL (ref 0.7–4.0)
MCH: 27.5 pg (ref 26.0–34.0)
MCHC: 30.9 g/dL (ref 30.0–36.0)
MCV: 89 fL (ref 80.0–100.0)
Monocytes Absolute: 0.7 10*3/uL (ref 0.1–1.0)
Monocytes Relative: 4 %
Neutro Abs: 9.2 10*3/uL — ABNORMAL HIGH (ref 1.7–7.7)
Neutrophils Relative %: 48 %
Platelets: 433 10*3/uL — ABNORMAL HIGH (ref 150–400)
RBC: 3.09 MIL/uL — ABNORMAL LOW (ref 4.22–5.81)
RDW: 15.9 % — ABNORMAL HIGH (ref 11.5–15.5)
WBC: 18.9 10*3/uL — ABNORMAL HIGH (ref 4.0–10.5)
nRBC: 0 % (ref 0.0–0.2)

## 2020-12-02 LAB — RENAL FUNCTION PANEL
Albumin: 2 g/dL — ABNORMAL LOW (ref 3.5–5.0)
Anion gap: 9 (ref 5–15)
BUN: 14 mg/dL (ref 8–23)
CO2: 21 mmol/L — ABNORMAL LOW (ref 22–32)
Calcium: 12 mg/dL — ABNORMAL HIGH (ref 8.9–10.3)
Chloride: 106 mmol/L (ref 98–111)
Creatinine, Ser: 0.87 mg/dL (ref 0.61–1.24)
GFR, Estimated: >60 mL/min (ref >60)
Glucose, Bld: 85 mg/dL (ref 70–99)
Phosphorus: 3 mg/dL (ref 2.5–4.6)
Potassium: 3.4 mmol/L — ABNORMAL LOW (ref 3.5–5.1)
Sodium: 136 mmol/L (ref 135–145)

## 2020-12-02 LAB — MAGNESIUM: Magnesium: 2.1 mg/dL (ref 1.7–2.4)

## 2020-12-02 MED ORDER — POTASSIUM CHLORIDE CRYS ER 20 MEQ PO TBCR
40.0000 meq | EXTENDED_RELEASE_TABLET | Freq: Once | ORAL | Status: DC
  Filled 2020-12-02: qty 2

## 2020-12-02 MED ORDER — SENNOSIDES-DOCUSATE SODIUM 8.6-50 MG PO TABS
1.0000 | ORAL_TABLET | Freq: Every day | ORAL | Status: DC
  Administered 2020-12-03 – 2020-12-10 (×2): 1 via ORAL
  Filled 2020-12-02 (×13): qty 1

## 2020-12-02 MED ORDER — POTASSIUM CHLORIDE 10 MEQ/100ML IV SOLN
10.0000 meq | INTRAVENOUS | Status: AC
  Administered 2020-12-02 (×4): 10 meq via INTRAVENOUS
  Filled 2020-12-02 (×4): qty 100

## 2020-12-02 MED ORDER — FUROSEMIDE 10 MG/ML IJ SOLN
40.0000 mg | Freq: Once | INTRAMUSCULAR | Status: AC
  Administered 2020-12-02: 40 mg via INTRAVENOUS
  Filled 2020-12-02: qty 4

## 2020-12-02 MED ORDER — FUROSEMIDE 10 MG/ML IJ SOLN
INTRAMUSCULAR | Status: AC
  Filled 2020-12-02: qty 2

## 2020-12-02 NOTE — Progress Notes (Signed)
PROGRESS NOTE    Jamie Wilson  WUX:[id number] DOB: 1955/11/01 DOA: 11/02/2020 PCP: Patient, [name]    Chief Complaint  Patient presents with  . Pain  . Bladder Cancer    Brief Narrative:  Jamie Wilson is a 65 y.o.malewith medical history significant ofbladder CA w/ chronic foley placement; bowel obstruction s/p partial bowel resection w/ colostomy. Patient presented secondary to abdominal pain and found to have evidence of sepsis possibly secondary to necrotic bladder mass. Empiric antibiotics initiated. Urology and oncology consulted and recommendations given. Hospital course complicated by hypercalcemia. Bone scan does not show any lytic or blastic bone lesions suspicious for bone mets.AKI resolved. Still with some degree of hypercalcemia. He also had some GI symptoms including nausea, emesis and abdominal distention. KUB on 1/19 raise concern for SBO but GI symptoms resolved the next day.  Hospitalization further complicated by development of ESBL bacteremia.  He was started on IV Merrem.    Assessment & Plan:   Principal Problem:   ESBL (extended spectrum beta-lactamase) producing bacteria infection Active Problems:   Malnutrition (HCC)   Sepsis (HCC)   Protein-calorie malnutrition, severe   Lower urinary tract infectious disease   Palliative care by specialist   Goals of care, counseling/discussion  1 sepsis secondary to ESBL E. coli bacteremia Likely secondary to underlying urothelial carcinoma of the bladder/necrotic mass with chronic indwelling Foley catheter use. Patient noted to have sepsis on presentation, status post completion of 2 weeks of ciprofloxacin per urology recommendations. Repeat blood cultures concerning for ESBL E. coli bacteremia. Repeat blood cultures (11/25/2020) with no growth to date. Patient currently afebrile. Continue IV Merrem through 12/03/2020 to complete a 10-day course of treatment.  Discussed with ID.  2.  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 Was seen by urology. Outpatient follow-up by urology for radical cystectomy, Dr. Jeffie Pollock Outpatient follow-up with hematology/oncology 2 weeks post discharge with Dr.Kale.  3.  Hypercalcemia of malignancy Status post Zometa.  Patient underwent bone survey during this hospitalization which was negative for metastatic disease.  Calcium levels were initially trending down but started to trend back up.  Patient given a dose of IV Lasix yesterday and on IV fluids with calcium levels are slowly trending down.  Corrected calcium at 13.60.  Urine output of 4.2 L over the past 24 hours.  Continue IV fluids.  Give another dose of Lasix 40 mg IV x1. If no significant improvement may need some calcitonin. Patient advised by Dr. Maylene Roes to stop dairy/milk products that contain calcium.  Follow.  4.  Acute kidney injury Resolved.  5.  Severe protein calorie malnutrition Dietitian following.  6.  Hypokalemia Potassium at 3.4.  Oral potassium ordered however per RN patient refusing.  KCl 10 mEq IV every 1 hour x4 rounds.  Magnesium at 2.1.  Follow.    DVT prophylaxis: SCDs Code Status: Full Family Communication: Updated patient.  No family at bedside. Disposition:   Status is: Inpatient    Dispo: The patient is from: Home              Anticipated d/c is to: SNF              Anticipated d/c date is: 12/05/2020              Patient currently with hypercalcemia, on IV antibiotics, not stable for discharge.   Difficult to place patient no       Consultants:  Wound care nurse  Palliative care: Dr. Rowe Pavy 11/05/2020  Hematology/oncology: Dr. Irene Limbo 11/04/2020  Urology: Dr. Abner Greenspan 11/03/2020  Procedures:   CT abdomen and pelvis 11/03/2020  Chest x-ray 11/03/2020  Plain films of bilateral feet 11/05/2020  Bone survey 11/11/2020  Abdominal films 11/16/2020, 11/22/2020  Antimicrobials:  Omnicef 11/05/2020>>>> 11/06/2020  Oral  ciprofloxacin 11/06/2020>>>> 11/17/2020  IV Merrem 11/23/2020>>>> 12/03/2020   Subjective: Asleep but arousable.  Denies chest pain.  No shortness of breath.  Denies any abdominal pain.  States he is feeling hungry today.   Objective: Vitals:   12/01/20 0540 12/01/20 1445 12/01/20 2155 12/02/20 0655  BP: 131/79 137/77 125/82 99/78  Pulse: 100 97 (!) 105 (!) 108  Resp: 18 17 16 16   Temp: 98.9 F (37.2 C) 98.7 F (37.1 C) 98 F (36.7 C) 98.7 F (37.1 C)  TempSrc: Oral Oral Oral Oral  SpO2: 96% 100% 96% 94%  Weight:      Height:        Intake/Output Summary (Last 24 hours) at 12/02/2020 1230 Last data filed at 12/02/2020 0946 Gross per 24 hour  Intake 3585.37 ml  Output 3300 ml  Net 285.37 ml   Filed Weights   11/03/20 0852 11/23/20 1017 11/24/20 0508  Weight: 50.7 kg 59.9 kg 58.1 kg    Examination:  General exam: Frail.  Cachectic.  Chronically ill-appearing.  Respiratory system: Lungs clear to auscultation bilateral.  No wheezes, no crackles, no rhonchi.  Normal respiratory effort.   Cardiovascular system: RRR no murmurs rubs or gallops.  No JVD.  No lower extremity edema.  Gastrointestinal system: Abdomen nontender, nondistended, soft, positive bowel sounds.  Ostomy bag intact.   Central nervous system: Alert and oriented. No focal neurological deficits. Extremities: No clubbing cyanosis or edema. Skin: No rashes, lesions or ulcers Psychiatry: Judgement and insight appear normal. Mood & affect appropriate.     Data Reviewed: I have personally reviewed following labs and imaging studies  CBC: Recent Labs  Lab 11/28/20 0602 11/29/20 0508 11/30/20 0523 12/01/20 0324 12/02/20 0307  WBC 18.8* 18.7* 16.9* 18.1* 18.9*  NEUTROABS  --   --   --  7.7 9.2*  HGB 8.6* 8.6* 8.3* 8.4* 8.5*  HCT 28.3* 28.7* 27.3* 27.4* 27.5*  MCV 91.6 91.4 91.0 89.5 89.0  PLT 468* 463* 437* 433* 433*    Basic Metabolic Panel: Recent Labs  Lab 11/26/20 0638 11/27/20 0603 11/28/20 0602  11/29/20 0508 11/30/20 0523 12/01/20 0324 12/02/20 0307  NA 137 137 137 139 136 134* 136  K 2.6* 3.1* 3.9 4.1 3.4* 3.6 3.4*  CL 109 111 111 110 107 106 106  CO2 19* 20* 20* 23 21* 21* 21*  GLUCOSE 110* 95 99 96 89 86 85  BUN 10 11 12 16 14 16 14   CREATININE 0.73 0.73 0.73 0.86 0.82 0.84 0.87  CALCIUM 10.2 10.8* 11.9* 12.8* 12.4* 12.4* 12.0*  MG 1.7 1.7  --   --  1.9 1.7 2.1  PHOS  --   --   --   --   --  2.3* 3.0    GFR: Estimated Creatinine Clearance: 69.6 mL/min (by C-G formula based on SCr of 0.87 mg/dL).  Liver Function Tests: Recent Labs  Lab 12/01/20 0324 12/02/20 0307  ALBUMIN 1.9* 2.0*    CBG: Recent Labs  Lab 11/26/20 0411 11/28/20 0521 11/30/20 0640 12/01/20 0442  GLUCAP 116* 97 92 80     Recent Results (from the past 240 hour(s))  Culture, blood (routine x 2)  Status: Abnormal   Collection Time: 11/22/20  2:59 PM   Specimen: BLOOD  Result Value Ref Range Status   Specimen Description   Final    BLOOD LEFT ANTECUBITAL Performed at [facility] [address] [place], [place] [address]    Special Requests   Final    BOTTLES DRAWN AEROBIC ONLY Blood Culture adequate volume Performed at [facility] [address], [place], [place] [address]    Culture  Setup Time   Final    GRAM NEGATIVE RODS AEROBIC BOTTLE ONLY CRITICAL VALUE NOTED.  VALUE IS CONSISTENT WITH PREVIOUSLY REPORTED AND CALLED VALUE.    Culture (A)  Final    ESCHERICHIA COLI SUSCEPTIBILITIES PERFORMED ON PREVIOUS CULTURE WITHIN THE LAST 5 DAYS. Performed at [facility], [place] [address], [place], [place] [address]    Report Status 11/26/2020 FINAL  Final  Culture, blood (routine x 2)     Status: Abnormal   Collection Time: 11/22/20  2:59 PM   Specimen: BLOOD LEFT HAND  Result Value Ref Range Status   Specimen Description   Final    BLOOD LEFT HAND Performed at [facility] [address], [place], [place]  [address]    Special Requests   Final    BOTTLES DRAWN AEROBIC ONLY Blood Culture adequate volume Performed at [facility] [address] [place], [place] [address]    Culture  Setup Time   Final    GRAM NEGATIVE RODS AEROBIC BOTTLE ONLY CRITICAL RESULT CALLED TO, READ BACK BY AND VERIFIED WITH: Ulice Bold PharmD 9:25 11/23/20 ([email or web address]) Performed at [facility], [place] [address], [place], [place] [address]    Culture (A)  Final    ESCHERICHIA COLI Confirmed Extended Spectrum Beta-Lactamase Producer (ESBL).  In bloodstream infections from ESBL organisms, carbapenems are preferred over piperacillin/tazobactam. They are shown to have a lower risk of mortality.    Report Status 11/26/2020 FINAL  Final   Organism ID, Bacteria ESCHERICHIA COLI  Final      Susceptibility   Escherichia coli - MIC*    AMPICILLIN >=32 RESISTANT Resistant     CEFAZOLIN >=64 RESISTANT Resistant     CEFEPIME >=32 RESISTANT Resistant     CEFTAZIDIME 32 RESISTANT Resistant     CEFTRIAXONE >=64 RESISTANT Resistant     CIPROFLOXACIN >=4 RESISTANT Resistant     GENTAMICIN <=1 SENSITIVE Sensitive     IMIPENEM <=0.25 SENSITIVE Sensitive     TRIMETH/SULFA <=20 SENSITIVE Sensitive     AMPICILLIN/SULBACTAM >=32 RESISTANT Resistant     * ESCHERICHIA COLI  Blood Culture ID Panel (Reflexed)     Status: Abnormal   Collection Time: 11/22/20  2:59 PM  Result Value Ref Range Status   Enterococcus faecalis NOT DETECTED NOT DETECTED Final   Enterococcus Faecium NOT DETECTED NOT DETECTED Final   Listeria monocytogenes NOT DETECTED NOT DETECTED Final   Staphylococcus species NOT DETECTED NOT DETECTED Final   Staphylococcus aureus (BCID) NOT DETECTED NOT DETECTED Final   Staphylococcus epidermidis NOT DETECTED NOT DETECTED Final   Staphylococcus lugdunensis NOT DETECTED NOT DETECTED Final   Streptococcus species NOT DETECTED NOT DETECTED Final   Streptococcus agalactiae NOT DETECTED NOT DETECTED Final    Streptococcus pneumoniae NOT DETECTED NOT DETECTED Final   Streptococcus pyogenes NOT DETECTED NOT DETECTED Final   A.calcoaceticus-baumannii NOT DETECTED NOT DETECTED Final   Bacteroides fragilis NOT DETECTED NOT DETECTED Final   Enterobacterales DETECTED (A) NOT DETECTED  Final    Comment: Enterobacterales represent a large order of gram negative bacteria, not a single organism. CRITICAL RESULT CALLED TO, READ BACK BY AND VERIFIED WITH: Ulice Bold PharmD 9:25 11/23/20 ([email or web address])    Enterobacter cloacae complex NOT DETECTED NOT DETECTED Final   Escherichia coli DETECTED (A) NOT DETECTED Final    Comment: CRITICAL RESULT CALLED TO, READ BACK BY AND VERIFIED WITH: Ulice Bold PharmD 9:25 11/23/20 ([email or web address])    Klebsiella aerogenes NOT DETECTED NOT DETECTED Final   Klebsiella oxytoca NOT DETECTED NOT DETECTED Final   Klebsiella pneumoniae NOT DETECTED NOT DETECTED Final   Proteus species NOT DETECTED NOT DETECTED Final   Salmonella species NOT DETECTED NOT DETECTED Final   Serratia marcescens NOT DETECTED NOT DETECTED Final   Haemophilus influenzae NOT DETECTED NOT DETECTED Final   Neisseria meningitidis NOT DETECTED NOT DETECTED Final   Pseudomonas aeruginosa NOT DETECTED NOT DETECTED Final   Stenotrophomonas maltophilia NOT DETECTED NOT DETECTED Final   Candida albicans NOT DETECTED NOT DETECTED Final   Candida auris NOT DETECTED NOT DETECTED Final   Candida glabrata NOT DETECTED NOT DETECTED Final   Candida krusei NOT DETECTED NOT DETECTED Final   Candida parapsilosis NOT DETECTED NOT DETECTED Final   Candida tropicalis NOT DETECTED NOT DETECTED Final   Cryptococcus neoformans/gattii NOT DETECTED NOT DETECTED Final   CTX-M ESBL DETECTED (A) NOT DETECTED Final    Comment: CRITICAL RESULT CALLED TO, READ BACK BY AND VERIFIED WITH: Ulice Bold PharmD 9:25 11/23/20 ([email or web address]) (NOTE) Extended spectrum beta-lactamase detected. Recommend a carbapenem as initial therapy.      Carbapenem resistance  IMP NOT DETECTED NOT DETECTED Final   Carbapenem resistance KPC NOT DETECTED NOT DETECTED Final   Carbapenem resistance NDM NOT DETECTED NOT DETECTED Final   Carbapenem resist OXA 48 LIKE NOT DETECTED NOT DETECTED Final   Carbapenem resistance VIM NOT DETECTED NOT DETECTED Final    Comment: Performed at [facility], [place] [address] [place], [place] [address]  Culture, blood (routine x 2)     Status: None   Collection Time: 11/25/20 11:04 AM   Specimen: BLOOD  Result Value Ref Range Status   Specimen Description   Final    BLOOD SITE NOT SPECIFIED Performed at [facility] [address] [place], [place] [address]    Special Requests   Final    BOTTLES DRAWN AEROBIC ONLY Blood Culture adequate volume Performed at [facility] [address] [place], [place] [address]    Culture   Final    NO GROWTH 5 DAYS Performed at [facility], [place] [address] [place], [place] [address]    Report Status 11/30/2020 FINAL  Final  Culture, blood (routine x 2)     Status: None   Collection Time: 11/25/20 11:04 AM   Specimen: BLOOD  Result Value Ref Range Status   Specimen Description   Final    BLOOD SITE NOT SPECIFIED Performed at [facility] [address], [place], [place] [address]    Special Requests   Final    BOTTLES DRAWN AEROBIC ONLY Blood Culture adequate volume Performed at [facility] [address], [place], [place] [address]    Culture   Final    NO GROWTH 5 DAYS Performed at [facility], [place] [address] [place],  [address]    Report Status 11/30/2020 FINAL  Final         Radiology Studies: No  results found.      Scheduled Meds: . (feeding supplement) PROSource Plus  30 mL Oral TID BM  . B-complex with vitamin C  1 tablet Oral Daily  . Chlorhexidine Gluconate Cloth  6 each Topical Daily  . feeding supplement  1 Container Oral TID BM  . folic acid  1 mg  Oral Daily  . hydrocerin   Topical BID  . nicotine  14 mg Transdermal Daily  . potassium chloride  40 mEq Oral Once  . potassium chloride  40 mEq Oral Once  . senna-docusate  1 tablet Oral BID  . tamsulosin  0.4 mg Oral Nightly  . cyanocobalamin  1,000 mcg Oral Daily   Continuous Infusions: . sodium chloride 125 mL/hr at 12/02/20 1035  . meropenem (MERREM) IV 1 g (12/02/20 1037)     LOS: 29 days    Time spent: 35 minutes    Irine Seal, MD [facility]   To contact the attending provider between 7A-7P or the covering provider during after hours 7P-7A, please log into the web site [email or web address] and access using universal [place] password for that web site. If you do not have the password, please call the hospital operator.  12/02/2020, 12:30 PM

## 2020-12-03 DIAGNOSIS — C679 Malignant neoplasm of bladder, unspecified: Secondary | ICD-10-CM | POA: Diagnosis not present

## 2020-12-03 DIAGNOSIS — E43 Unspecified severe protein-calorie malnutrition: Secondary | ICD-10-CM | POA: Diagnosis not present

## 2020-12-03 DIAGNOSIS — A499 Bacterial infection, unspecified: Secondary | ICD-10-CM | POA: Diagnosis not present

## 2020-12-03 LAB — CBC WITH DIFFERENTIAL/PLATELET
Abs Immature Granulocytes: 0.05 10*3/uL (ref 0.00–0.07)
Basophils Absolute: 0.1 10*3/uL (ref 0.0–0.1)
Basophils Relative: 1 %
Eosinophils Absolute: 7 10*3/uL — ABNORMAL HIGH (ref 0.0–0.5)
Eosinophils Relative: 35 %
HCT: 28.6 % — ABNORMAL LOW (ref 39.0–52.0)
Hemoglobin: 8.9 g/dL — ABNORMAL LOW (ref 13.0–17.0)
Immature Granulocytes: 0 %
Lymphocytes Relative: 7 %
Lymphs Abs: 1.4 10*3/uL (ref 0.7–4.0)
MCH: 27.5 pg (ref 26.0–34.0)
MCHC: 31.1 g/dL (ref 30.0–36.0)
MCV: 88.3 fL (ref 80.0–100.0)
Monocytes Absolute: 0.7 10*3/uL (ref 0.1–1.0)
Monocytes Relative: 3 %
Neutro Abs: 10.9 10*3/uL — ABNORMAL HIGH (ref 1.7–7.7)
Neutrophils Relative %: 54 %
Platelets: 394 10*3/uL (ref 150–400)
RBC: 3.24 MIL/uL — ABNORMAL LOW (ref 4.22–5.81)
RDW: 15.8 % — ABNORMAL HIGH (ref 11.5–15.5)
WBC: 20.1 10*3/uL — ABNORMAL HIGH (ref 4.0–10.5)
nRBC: 0 % (ref 0.0–0.2)

## 2020-12-03 LAB — RENAL FUNCTION PANEL
Albumin: 2 g/dL — ABNORMAL LOW (ref 3.5–5.0)
Anion gap: 6 (ref 5–15)
BUN: 12 mg/dL (ref 8–23)
CO2: 21 mmol/L — ABNORMAL LOW (ref 22–32)
Calcium: 11.6 mg/dL — ABNORMAL HIGH (ref 8.9–10.3)
Chloride: 105 mmol/L (ref 98–111)
Creatinine, Ser: 0.89 mg/dL (ref 0.61–1.24)
GFR, Estimated: >60 mL/min (ref >60)
Glucose, Bld: 96 mg/dL (ref 70–99)
Phosphorus: 2.4 mg/dL — ABNORMAL LOW (ref 2.5–4.6)
Potassium: 3.2 mmol/L — ABNORMAL LOW (ref 3.5–5.1)
Sodium: 132 mmol/L — ABNORMAL LOW (ref 135–145)

## 2020-12-03 LAB — MAGNESIUM: Magnesium: 1.8 mg/dL (ref 1.7–2.4)

## 2020-12-03 MED ORDER — POTASSIUM CHLORIDE 10 MEQ/100ML IV SOLN
10.0000 meq | INTRAVENOUS | Status: AC
  Administered 2020-12-03 (×2): 10 meq via INTRAVENOUS

## 2020-12-03 MED ORDER — FUROSEMIDE 10 MG/ML IJ SOLN
40.0000 mg | Freq: Once | INTRAMUSCULAR | Status: AC
  Administered 2020-12-03: 40 mg via INTRAVENOUS
  Filled 2020-12-03: qty 4

## 2020-12-03 MED ORDER — OXYCODONE HCL 5 MG/5ML PO SOLN
5.0000 mg | Freq: Once | ORAL | Status: AC
  Administered 2020-12-03: 12:00:00 5 mg via ORAL

## 2020-12-03 MED ORDER — POTASSIUM PHOSPHATES 15 MMOLE/5ML IV SOLN
30.0000 mmol | Freq: Once | INTRAVENOUS | Status: AC
  Administered 2020-12-03: 11:00:00 30 mmol via INTRAVENOUS
  Filled 2020-12-03: qty 10

## 2020-12-03 MED ORDER — POLYETHYLENE GLYCOL 3350 17 G PO PACK: 17.0000 g | PACK | Freq: Two times a day (BID) | ORAL | Status: DC

## 2020-12-03 MED ORDER — MAGNESIUM SULFATE 2 GM/50ML IV SOLN
2.0000 g | Freq: Once | INTRAVENOUS | Status: AC
  Administered 2020-12-03: 2 g via INTRAVENOUS
  Filled 2020-12-03: qty 50

## 2020-12-03 MED ORDER — POTASSIUM CHLORIDE 10 MEQ/100ML IV SOLN
10.0000 meq | INTRAVENOUS | Status: AC
  Administered 2020-12-03 (×3): 10 meq via INTRAVENOUS
  Filled 2020-12-03 (×2): qty 100

## 2020-12-03 MED ORDER — POLYETHYLENE GLYCOL 3350 17 G PO PACK
17.0000 g | PACK | Freq: Every day | ORAL | Status: DC
  Administered 2020-12-03 – 2020-12-10 (×7): 17 g via ORAL
  Filled 2020-12-03 (×7): qty 1

## 2020-12-03 NOTE — Progress Notes (Addendum)
PROGRESS NOTE    Jamie Wilson  JSE:[id number] DOB: [birth year] DOA: 11/02/2020 PCP: Patient, [name]    Chief Complaint  Patient presents with  . Pain  . Bladder Cancer    Brief Narrative:  Jamie Wilson is a 65 y.o.malewith medical history significant ofbladder CA w/ chronic foley placement; bowel obstruction s/p partial bowel resection w/ colostomy. Patient presented secondary to abdominal pain and found to have evidence of sepsis possibly secondary to necrotic bladder mass. Empiric antibiotics initiated. Urology and oncology consulted and recommendations given. Hospital course complicated by hypercalcemia. Bone scan does not show any lytic or blastic bone lesions suspicious for bone mets.AKI resolved. Still with some degree of hypercalcemia. He also had some GI symptoms including nausea, emesis and abdominal distention. KUB on 1/19 raise concern for SBO but GI symptoms resolved the next day.  Hospitalization further complicated by development of ESBL bacteremia.  He was started on IV Merrem.    Assessment & Plan:   Principal Problem:   ESBL (extended spectrum beta-lactamase) producing bacteria infection Active Problems:   Malnutrition (HCC)   Sepsis (HCC)   Protein-calorie malnutrition, severe   Lower urinary tract infectious disease   Palliative care by specialist   Goals of care, counseling/discussion  1 sepsis secondary to ESBL E. coli bacteremia Likely secondary to underlying urothelial carcinoma of the bladder/necrotic mass with chronic indwelling Foley catheter use. Patient noted to have sepsis on presentation, status post completion of 2 weeks of ciprofloxacin per urology recommendations. Repeat blood cultures concerning for ESBL E. coli bacteremia. Repeat blood cultures (11/25/2020) with no growth to date. Patient currently afebrile. Continue IV Merrem through 12/03/2020 to complete a 10-day course of treatment.  Discussed with ID.  2.  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 Was seen by urology. Outpatient follow-up by urology for radical cystectomy, Dr. Jeffie Pollock Outpatient follow-up with hematology/oncology 2 weeks post discharge with Dr.Kale.  3.  Hypercalcemia of malignancy Status post Zometa.  Patient underwent bone survey during this hospitalization which was negative for metastatic disease.  Calcium levels were initially trending down but started to trend back up.  Patient status post IV Lasix 40 mg for the past 2 days in addition to IV fluids with improvement with calcium levels which are trending down. Corrected calcium at 13.2.  Urine output of 5.8 L over the past 24 hours.  Continue IV fluids.  Lasix 40 mg IV x1.  Patient advised by Dr. Maylene Roes to stop dairy/milk products that contain calcium.  Follow.  4.  Acute kidney injury Resolved.  5.  Severe protein calorie malnutrition Dietitian following.  6.  Hypokalemia/hypophosphatemia Potassium at 3.2.  KCl 10 mEq IV every hour x4 rounds.  Magnesium 1.8.  Phosphorus level at 2.4.  K-Phos 30 mmol IV x1. Magnesium 2 g IV x1. Follow.    DVT prophylaxis: SCDs Code Status: Full Family Communication: Updated patient.  No family at bedside. Disposition:   Status is: Inpatient    Dispo: The patient is from: Home              Anticipated d/c is to: SNF              Anticipated d/c date is: 12/05/2020              Patient currently with hypercalcemia, on IV antibiotics, not stable for discharge.   Difficult to place patient no       Consultants:   Wound  care nurse  Palliative care: Dr. Rowe Pavy 11/05/2020  Hematology/oncology: Dr. Irene Limbo 11/04/2020  Urology: Dr. Abner Greenspan 11/03/2020  Procedures:   CT abdomen and pelvis 11/03/2020  Chest x-ray 11/03/2020  Plain films of bilateral feet 11/05/2020  Bone survey 11/11/2020  Abdominal films 11/16/2020, 11/22/2020  Antimicrobials:  Omnicef 11/05/2020>>>> 11/06/2020  Oral ciprofloxacin  11/06/2020>>>> 11/17/2020  IV Merrem 11/23/2020>>>> 12/03/2020   Subjective: Laying in bed.  Poor appetite.  States food with too many spices.  Denies any chest pain or shortness of breath.  No abdominal pain.  Poor oral intake per RN and nurse tech.   Objective: Vitals:   12/02/20 0655 12/02/20 1357 12/02/20 2117 12/03/20 0611  BP: 99/78 110/71 101/63 128/66  Pulse: (!) 108 (!) 102 (!) 108 (!) 109  Resp: 16  18 18   Temp: 98.7 F (37.1 C) 99.3 F (37.4 C) 98.5 F (36.9 C) 98.9 F (37.2 C)  TempSrc: Oral Oral Oral Oral  SpO2: 94% 96% 95% 97%  Weight:      Height:        Intake/Output Summary (Last 24 hours) at 12/03/2020 1222 Last data filed at 12/03/2020 1051 Gross per 24 hour  Intake 2466.4 ml  Output 6000 ml  Net -3533.6 ml   Filed Weights   11/03/20 0852 11/23/20 1017 11/24/20 0508  Weight: 50.7 kg 59.9 kg 58.1 kg    Examination:  General exam: Frail.  Cachectic.  Chronically ill-appearing. Respiratory system: Clear to auscultation bilaterally.  No wheezes, no crackles, no rhonchi.  Normal respiratory effort.  Speaking in full sentences.  Cardiovascular system: Regular rate rhythm no murmurs rubs or gallops.  No JVD.  No lower extremity edema.  Gastrointestinal system: Abdomen is soft, nontender, nondistended, positive bowel sounds.  Ostomy bag with small brown liquid stool noted.  Central nervous system: Alert and oriented. No focal neurological deficits. Extremities: No clubbing cyanosis or edema. Skin: No rashes, lesions or ulcers Psychiatry: Judgement and insight appear normal. Mood & affect appropriate.     Data Reviewed: I have personally reviewed following labs and imaging studies  CBC: Recent Labs  Lab 11/29/20 0508 11/30/20 0523 12/01/20 0324 12/02/20 0307 12/03/20 0330  WBC 18.7* 16.9* 18.1* 18.9* 20.1*  NEUTROABS  --   --  7.7 9.2* 10.9*  HGB 8.6* 8.3* 8.4* 8.5* 8.9*  HCT 28.7* 27.3* 27.4* 27.5* 28.6*  MCV 91.4 91.0 89.5 89.0 88.3  PLT 463* 437*  433* 433* [id number]    Basic Metabolic Panel: Recent Labs  Lab 11/27/20 0603 11/28/20 0602 11/29/20 0508 11/30/20 0523 12/01/20 0324 12/02/20 0307 12/03/20 0330  NA 137   < > 139 136 134* 136 132*  K 3.1*   < > 4.1 3.4* 3.6 3.4* 3.2*  CL 111   < > 110 107 106 106 105  CO2 20*   < > 23 21* 21* 21* 21*  GLUCOSE 95   < > 96 89 86 85 96  BUN 11   < > 16 14 16 14 12   CREATININE 0.73   < > 0.86 0.82 0.84 0.87 0.89  CALCIUM 10.8*   < > 12.8* 12.4* 12.4* 12.0* 11.6*  MG 1.7  --   --  1.9 1.7 2.1 1.8  PHOS  --   --   --   --  2.3* 3.0 2.4*   < > = values in this interval not displayed.    GFR: Estimated Creatinine Clearance: 68 mL/min (by C-G formula based on SCr of 0.89 mg/dL).  Liver Function  Tests: Recent Labs  Lab 12/01/20 0324 12/02/20 0307 12/03/20 0330  ALBUMIN 1.9* 2.0* 2.0*    CBG: Recent Labs  Lab 11/28/20 0521 11/30/20 0640 12/01/20 0442  GLUCAP 97 92 80     Recent Results (from the past 240 hour(s))  Culture, blood (routine x 2)     Status: None   Collection Time: 11/25/20 11:04 AM   Specimen: BLOOD  Result Value Ref Range Status   Specimen Description   Final    BLOOD SITE NOT SPECIFIED Performed at [facility] [address], [place], [place] [address]    Special Requests   Final    BOTTLES DRAWN AEROBIC ONLY Blood Culture adequate volume Performed at [facility] [address] [place], [place] [address]    Culture   Final    NO GROWTH 5 DAYS Performed at [facility], [place] [address], [place], [place] [address]    Report Status 11/30/2020 FINAL  Final  Culture, blood (routine x 2)     Status: None   Collection Time: 11/25/20 11:04 AM   Specimen: BLOOD  Result Value Ref Range Status   Specimen Description   Final    BLOOD SITE NOT SPECIFIED Performed at [facility] [address] [place], [place] [address]    Special Requests   Final    BOTTLES DRAWN AEROBIC ONLY Blood  Culture adequate volume Performed at [facility] [address], [place], [place] [address]    Culture   Final    NO GROWTH 5 DAYS Performed at [facility], [place] [address] [place], [place] [address]    Report Status 11/30/2020 FINAL  Final         Radiology Studies: No results found.      Scheduled Meds: . (feeding supplement) PROSource Plus  30 mL Oral TID BM  . B-complex with vitamin C  1 tablet Oral Daily  . Chlorhexidine Gluconate Cloth  6 each Topical Daily  . feeding supplement  1 Container Oral TID BM  . folic acid  1 mg Oral Daily  . furosemide  40 mg Intravenous Once  . hydrocerin   Topical BID  . nicotine  14 mg Transdermal Daily  . oxyCODONE  5 mg Oral Once  . polyethylene glycol  17 g Oral Daily  . senna-docusate  1 tablet Oral QHS  . tamsulosin  0.4 mg Oral Nightly  . cyanocobalamin  1,000 mcg Oral Daily   Continuous Infusions: . sodium chloride 125 mL/hr at 12/03/20 1103  . meropenem (MERREM) IV 1 g (12/03/20 1002)  . potassium chloride    . potassium PHOSPHATE IVPB (in mmol) 30 mmol (12/03/20 1100)     LOS: 30 days    Time spent: 35 minutes    Irine Seal, MD [facility]   To contact the attending provider between 7A-7P or the covering provider during after hours 7P-7A, please log into the web site [email or web address] and access using universal [place] password for that web site. If you do not have the password, please call the hospital operator.  12/03/2020, 12:22 PM

## 2020-12-04 DIAGNOSIS — A499 Bacterial infection, unspecified: Secondary | ICD-10-CM | POA: Diagnosis not present

## 2020-12-04 DIAGNOSIS — C679 Malignant neoplasm of bladder, unspecified: Secondary | ICD-10-CM | POA: Diagnosis not present

## 2020-12-04 DIAGNOSIS — E43 Unspecified severe protein-calorie malnutrition: Secondary | ICD-10-CM | POA: Diagnosis not present

## 2020-12-04 LAB — CBC WITH DIFFERENTIAL/PLATELET
Abs Immature Granulocytes: 0.06 10*3/uL (ref 0.00–0.07)
Basophils Absolute: 0.1 10*3/uL (ref 0.0–0.1)
Basophils Relative: 0 %
Eosinophils Absolute: 6.8 10*3/uL — ABNORMAL HIGH (ref 0.0–0.5)
Eosinophils Relative: 36 %
HCT: 26.9 % — ABNORMAL LOW (ref 39.0–52.0)
Hemoglobin: 8.4 g/dL — ABNORMAL LOW (ref 13.0–17.0)
Immature Granulocytes: 0 %
Lymphocytes Relative: 8 %
Lymphs Abs: 1.6 10*3/uL (ref 0.7–4.0)
MCH: 27.4 pg (ref 26.0–34.0)
MCHC: 31.2 g/dL (ref 30.0–36.0)
MCV: 87.6 fL (ref 80.0–100.0)
Monocytes Absolute: 0.7 10*3/uL (ref 0.1–1.0)
Monocytes Relative: 4 %
Neutro Abs: 9.8 10*3/uL — ABNORMAL HIGH (ref 1.7–7.7)
Neutrophils Relative %: 52 %
Platelets: 399 10*3/uL (ref 150–400)
RBC: 3.07 MIL/uL — ABNORMAL LOW (ref 4.22–5.81)
RDW: 16 % — ABNORMAL HIGH (ref 11.5–15.5)
WBC: 19.1 10*3/uL — ABNORMAL HIGH (ref 4.0–10.5)
nRBC: 0 % (ref 0.0–0.2)

## 2020-12-04 LAB — BASIC METABOLIC PANEL
Anion gap: 6 (ref 5–15)
BUN: 12 mg/dL (ref 8–23)
CO2: 22 mmol/L (ref 22–32)
Calcium: 12 mg/dL — ABNORMAL HIGH (ref 8.9–10.3)
Chloride: 107 mmol/L (ref 98–111)
Creatinine, Ser: 0.98 mg/dL (ref 0.61–1.24)
GFR, Estimated: >60 mL/min (ref >60)
Glucose, Bld: 97 mg/dL (ref 70–99)
Potassium: 3.8 mmol/L (ref 3.5–5.1)
Sodium: 135 mmol/L (ref 135–145)

## 2020-12-04 LAB — MAGNESIUM: Magnesium: 2.1 mg/dL (ref 1.7–2.4)

## 2020-12-04 LAB — GLUCOSE, CAPILLARY: Glucose-Capillary: 99 mg/dL (ref 70–99)

## 2020-12-04 MED ORDER — FUROSEMIDE 10 MG/ML IJ SOLN
40.0000 mg | Freq: Every day | INTRAMUSCULAR | Status: DC
  Administered 2020-12-04 – 2020-12-11 (×8): 40 mg via INTRAVENOUS
  Filled 2020-12-04 (×8): qty 4

## 2020-12-04 NOTE — Progress Notes (Signed)
PROGRESS NOTE    Jamie Wilson  YNW:[id number] DOB: 1955-12-24 DOA: 11/02/2020 PCP: Patient, [name]    Chief Complaint  Patient presents with  . Pain  . Bladder Cancer    Brief Narrative:  Jamie Wilson is a 65 y.o.malewith medical history significant ofbladder CA w/ chronic foley placement; bowel obstruction s/p partial bowel resection w/ colostomy. Patient presented secondary to abdominal pain and found to have evidence of sepsis possibly secondary to necrotic bladder mass. Empiric antibiotics initiated. Urology and oncology consulted and recommendations given. Hospital course complicated by hypercalcemia. Bone scan does not show any lytic or blastic bone lesions suspicious for bone mets.AKI resolved. Still with some degree of hypercalcemia. He also had some GI symptoms including nausea, emesis and abdominal distention. KUB on 1/19 raise concern for SBO but GI symptoms resolved the next day.  Hospitalization further complicated by development of ESBL bacteremia.  He was started on IV Merrem.    Assessment & Plan:   Principal Problem:   ESBL (extended spectrum beta-lactamase) producing bacteria infection Active Problems:   Malnutrition (HCC)   Sepsis (HCC)   Protein-calorie malnutrition, severe   Lower urinary tract infectious disease   Palliative care by specialist   Goals of care, counseling/discussion  1 sepsis secondary to ESBL E. coli bacteremia Likely secondary to underlying urothelial carcinoma of the bladder/necrotic mass with chronic indwelling Foley catheter use. Patient noted to have sepsis on presentation, status post completion of 2 weeks of ciprofloxacin per urology recommendations. Repeat blood cultures concerning for ESBL E. coli bacteremia. Repeat blood cultures (11/25/2020) with no growth to date. Patient currently afebrile. Status post full course (10DAYS) IV Merrem.  Discussed with ID.   2.  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 Was seen by urology. Outpatient follow-up by urology for radical cystectomy, Dr. Jeffie Pollock Outpatient follow-up with hematology/oncology 2 weeks post discharge with Dr.Kale.  3.  Hypercalcemia of malignancy Status post Zometa.  Patient underwent bone survey during this hospitalization which was negative for metastatic disease.  Calcium levels were initially trending down but started to trend back up.  Patient status post IV Lasix 40 mg for the past 3 days in addition to IV fluids with improvement with calcium levels which were trending down but slightly up today.  Corrected calcium at 13.60.  Urine output of 4.4 to 5 L over the past 24 hours.  Continue IV fluids, placed on Lasix 40 mg IV daily.   Patient advised by Dr. Maylene Roes to stop dairy/milk products that contain calcium.  Follow.  4.  Acute kidney injury Resolved.  5.  Severe protein calorie malnutrition Dietitian following.  6.  Hypokalemia/hypophosphatemia Potassium at 3.8.  Magnesium at 2.1.  Repeat labs in the morning.     DVT prophylaxis: SCDs Code Status: Full Family Communication: Updated patient.  No family at bedside. Disposition:   Status is: Inpatient    Dispo: The patient is from: Home              Anticipated d/c is to: SNF              Anticipated d/c date is: 2/7-05/2021              Patient currently with hypercalcemia, not stable for discharge.   Difficult to place patient no       Consultants:   Wound care nurse  Palliative care: Dr. Rowe Pavy 11/05/2020  Hematology/oncology: Dr. Irene Limbo 11/04/2020  Urology: Dr.  Gay 11/03/2020  Procedures:   CT abdomen and pelvis 11/03/2020  Chest x-ray 11/03/2020  Plain films of bilateral feet 11/05/2020  Bone survey 11/11/2020  Abdominal films 11/16/2020, 11/22/2020  Antimicrobials:  Omnicef 11/05/2020>>>> 11/06/2020  Oral ciprofloxacin 11/06/2020>>>> 11/17/2020  IV Merrem 11/23/2020>>>> 12/03/2020   Subjective: Sleeping in bed  but arousable.  Still with a poor appetite.  Denies any chest pain.  No shortness of breath.  No abdominal pain.   Objective: Vitals:   12/03/20 0611 12/03/20 1401 12/03/20 2216 12/04/20 0557  BP: 128/66 104/77 124/73 121/77  Pulse: (!) 109 100 (!) 105 (!) 110  Resp: 18 17 16    Temp: 98.9 F (37.2 C)  98 F (36.7 C)   TempSrc: Oral  Oral   SpO2: 97% 96% (!) 86% 97%  Weight:      Height:        Intake/Output Summary (Last 24 hours) at 12/04/2020 1241 Last data filed at 12/04/2020 1000 Gross per 24 hour  Intake 4422.14 ml  Output 4425 ml  Net -2.86 ml   Filed Weights   11/03/20 0852 11/23/20 1017 11/24/20 0508  Weight: 50.7 kg 59.9 kg 58.1 kg    Examination:  General exam: : Frail.  Cachectic.  Chronically ill-appearing. Respiratory system: CTA B.  No wheezes, no crackles, no rhonchi.  Normal respiratory effort.  Cardiovascular system: Regular rate and rhythm no murmurs rubs or gallops.  No JVD.  No lower extremity edema.  Gastrointestinal system: Abdomen soft, nontender, nondistended, positive bowel sounds.  No rebound.  No guarding.  Ostomy bag intact with minimal brown stool. Central nervous system: Alert and oriented. No focal neurological deficits. Extremities: Symmetric 5 x 5 power. Skin: No rashes, lesions or ulcers Psychiatry: Judgement and insight appear normal. Mood & affect appropriate.   Data Reviewed: I have personally reviewed following labs and imaging studies  CBC: Recent Labs  Lab 11/30/20 0523 12/01/20 0324 12/02/20 0307 12/03/20 0330 12/04/20 0623  WBC 16.9* 18.1* 18.9* 20.1* 19.1*  NEUTROABS  --  7.7 9.2* 10.9* 9.8*  HGB 8.3* 8.4* 8.5* 8.9* 8.4*  HCT 27.3* 27.4* 27.5* 28.6* 26.9*  MCV 91.0 89.5 89.0 88.3 87.6  PLT 437* 433* 433* 394 [id number]    Basic Metabolic Panel: Recent Labs  Lab 11/30/20 0523 12/01/20 0324 12/02/20 0307 12/03/20 0330 12/04/20 0623  NA 136 134* 136 132* 135  K 3.4* 3.6 3.4* 3.2* 3.8  CL 107 106 106 105 107  CO2 21*  21* 21* 21* 22  GLUCOSE 89 86 85 96 97  BUN 14 16 14 12 12   CREATININE 0.82 0.84 0.87 0.89 0.98  CALCIUM 12.4* 12.4* 12.0* 11.6* 12.0*  MG 1.9 1.7 2.1 1.8 2.1  PHOS  --  2.3* 3.0 2.4*  --     GFR: Estimated Creatinine Clearance: 61.8 mL/min (by C-G formula based on SCr of 0.98 mg/dL).  Liver Function Tests: Recent Labs  Lab 12/01/20 0324 12/02/20 0307 12/03/20 0330  ALBUMIN 1.9* 2.0* 2.0*    CBG: Recent Labs  Lab 11/28/20 0521 11/30/20 0640 12/01/20 0442 12/04/20 0517  GLUCAP 97 92 80 99     Recent Results (from the past 240 hour(s))  Culture, blood (routine x 2)     Status: None   Collection Time: 11/25/20 11:04 AM   Specimen: BLOOD  Result Value Ref Range Status   Specimen Description   Final    BLOOD SITE NOT SPECIFIED Performed at [facility] [address] [place], [place] [address]  Special Requests   Final    BOTTLES DRAWN AEROBIC ONLY Blood Culture adequate volume Performed at [facility] [address], [place], [place] [address]    Culture   Final    NO GROWTH 5 DAYS Performed at [facility], [place] [address] [place], [place] [address]    Report Status 11/30/2020 FINAL  Final  Culture, blood (routine x 2)     Status: None   Collection Time: 11/25/20 11:04 AM   Specimen: BLOOD  Result Value Ref Range Status   Specimen Description   Final    BLOOD SITE NOT SPECIFIED Performed at [facility] [address], [place], [place] [address]    Special Requests   Final    BOTTLES DRAWN AEROBIC ONLY Blood Culture adequate volume Performed at [facility] [address], [place], [place] [address]    Culture   Final    NO GROWTH 5 DAYS Performed at [facility], [place] [address] [place], [place] [address]    Report Status 11/30/2020 FINAL  Final         Radiology Studies: No results found.      Scheduled Meds: . (feeding supplement) PROSource  Plus  30 mL Oral TID BM  . B-complex with vitamin C  1 tablet Oral Daily  . Chlorhexidine Gluconate Cloth  6 each Topical Daily  . feeding supplement  1 Container Oral TID BM  . folic acid  1 mg Oral Daily  . furosemide  40 mg Intravenous Daily  . hydrocerin   Topical BID  . nicotine  14 mg Transdermal Daily  . polyethylene glycol  17 g Oral Daily  . senna-docusate  1 tablet Oral QHS  . tamsulosin  0.4 mg Oral Nightly  . cyanocobalamin  1,000 mcg Oral Daily   Continuous Infusions: . sodium chloride 125 mL/hr at 12/04/20 1059     LOS: 31 days    Time spent: 35 minutes    Irine Seal, MD [facility]   To contact the attending provider between 7A-7P or the covering provider during after hours 7P-7A, please log into the web site [email or web address] and access using universal [place] password for that web site. If you do not have the password, please call the hospital operator.  12/04/2020, 12:41 PM

## 2020-12-05 DIAGNOSIS — A499 Bacterial infection, unspecified: Secondary | ICD-10-CM | POA: Diagnosis not present

## 2020-12-05 DIAGNOSIS — E43 Unspecified severe protein-calorie malnutrition: Secondary | ICD-10-CM | POA: Diagnosis not present

## 2020-12-05 DIAGNOSIS — C679 Malignant neoplasm of bladder, unspecified: Secondary | ICD-10-CM | POA: Diagnosis not present

## 2020-12-05 LAB — PATHOLOGIST SMEAR REVIEW

## 2020-12-05 LAB — CBC WITH DIFFERENTIAL/PLATELET
Abs Immature Granulocytes: 0 10*3/uL (ref 0.00–0.07)
Basophils Absolute: 0.3 10*3/uL — ABNORMAL HIGH (ref 0.0–0.1)
Basophils Relative: 2 %
Eosinophils Absolute: 6.1 10*3/uL — ABNORMAL HIGH (ref 0.0–0.5)
Eosinophils Relative: 35 %
HCT: 27.5 % — ABNORMAL LOW (ref 39.0–52.0)
Hemoglobin: 8.5 g/dL — ABNORMAL LOW (ref 13.0–17.0)
Lymphocytes Relative: 9 %
Lymphs Abs: 1.6 10*3/uL (ref 0.7–4.0)
MCH: 27.6 pg (ref 26.0–34.0)
MCHC: 30.9 g/dL (ref 30.0–36.0)
MCV: 89.3 fL (ref 80.0–100.0)
Monocytes Absolute: 0 10*3/uL — ABNORMAL LOW (ref 0.1–1.0)
Monocytes Relative: 0 %
Neutro Abs: 9.3 10*3/uL — ABNORMAL HIGH (ref 1.7–7.7)
Neutrophils Relative %: 54 %
Platelets: 382 10*3/uL (ref 150–400)
RBC: 3.08 MIL/uL — ABNORMAL LOW (ref 4.22–5.81)
RDW: 15.9 % — ABNORMAL HIGH (ref 11.5–15.5)
WBC: 17.3 10*3/uL — ABNORMAL HIGH (ref 4.0–10.5)
nRBC: 0 % (ref 0.0–0.2)

## 2020-12-05 LAB — RENAL FUNCTION PANEL
Albumin: 2 g/dL — ABNORMAL LOW (ref 3.5–5.0)
Anion gap: 8 (ref 5–15)
BUN: 12 mg/dL (ref 8–23)
CO2: 22 mmol/L (ref 22–32)
Calcium: 11.8 mg/dL — ABNORMAL HIGH (ref 8.9–10.3)
Chloride: 106 mmol/L (ref 98–111)
Creatinine, Ser: 0.83 mg/dL (ref 0.61–1.24)
GFR, Estimated: >60 mL/min (ref >60)
Glucose, Bld: 86 mg/dL (ref 70–99)
Phosphorus: 2.7 mg/dL (ref 2.5–4.6)
Potassium: 3.3 mmol/L — ABNORMAL LOW (ref 3.5–5.1)
Sodium: 136 mmol/L (ref 135–145)

## 2020-12-05 LAB — GLUCOSE, CAPILLARY: Glucose-Capillary: 87 mg/dL (ref 70–99)

## 2020-12-05 LAB — MAGNESIUM: Magnesium: 1.8 mg/dL (ref 1.7–2.4)

## 2020-12-05 MED ORDER — POTASSIUM CHLORIDE 10 MEQ/100ML IV SOLN
10.0000 meq | INTRAVENOUS | Status: AC
  Administered 2020-12-05 (×4): 10 meq via INTRAVENOUS
  Filled 2020-12-05: qty 100

## 2020-12-05 MED ORDER — MAGNESIUM SULFATE 4 GM/100ML IV SOLN
4.0000 g | Freq: Once | INTRAVENOUS | Status: AC
  Administered 2020-12-05: 10:00:00 4 g via INTRAVENOUS
  Filled 2020-12-05: qty 100

## 2020-12-05 NOTE — Progress Notes (Signed)
I accidentally clicked on the 'complete task' button for routine iv care. Same was not done.   Thanks

## 2020-12-05 NOTE — Progress Notes (Signed)
PROGRESS NOTE    Jamie Wilson  WUJ:[id number] DOB: May 10, 1956 DOA: 11/02/2020 PCP: Patient, [name]    Chief Complaint  Patient presents with  . Pain  . Bladder Cancer    Brief Narrative:  Jamie Wilson is a 65 y.o.malewith medical history significant ofbladder CA w/ chronic foley placement; bowel obstruction s/p partial bowel resection w/ colostomy. Patient presented secondary to abdominal pain and found to have evidence of sepsis possibly secondary to necrotic bladder mass. Empiric antibiotics initiated. Urology and oncology consulted and recommendations given. Hospital course complicated by hypercalcemia. Bone scan does not show any lytic or blastic bone lesions suspicious for bone mets.AKI resolved. Still with some degree of hypercalcemia. He also had some GI symptoms including nausea, emesis and abdominal distention. KUB on 1/19 raise concern for SBO but GI symptoms resolved the next day.  Hospitalization further complicated by development of ESBL bacteremia.  He was started on IV Merrem.    Assessment & Plan:   Principal Problem:   ESBL (extended spectrum beta-lactamase) producing bacteria infection Active Problems:   Malnutrition (HCC)   Sepsis (HCC)   Protein-calorie malnutrition, severe   Lower urinary tract infectious disease   Palliative care by specialist   Goals of care, counseling/discussion  1 sepsis secondary to ESBL E. coli bacteremia Likely secondary to underlying urothelial carcinoma of the bladder/necrotic mass with chronic indwelling Foley catheter use. Patient noted to have sepsis on presentation, status post completion of 2 weeks of ciprofloxacin per urology recommendations. Repeat blood cultures concerning for ESBL E. coli bacteremia. Repeat blood cultures (11/25/2020) with no growth to date. Patient currently afebrile. Status post full course (10DAYS) IV Merrem.  Discussed with ID.   2.  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 Was seen by urology. Outpatient follow-up by urology for radical cystectomy, Dr. Jeffie Pollock Outpatient follow-up with hematology/oncology 2 weeks post discharge with Dr.Kale.  3.  Hypercalcemia of malignancy Status post Zometa.  Patient underwent bone survey during this hospitalization which was negative for metastatic disease.  Calcium levels were initially trending down but started to trend back up.  Patient status post IV Lasix 40 mg in addition to IV fluids with improvement with calcium levels which were trending down but still elevated.  Corrected calcium at 12.6.  Urine output of 3.3 L over the past 24 hours.  Continue IV fluids, IV Lasix.  Patient advised by Dr. Maylene Roes to stop dairy/milk products that contain calcium.  Follow.  4.  Acute kidney injury Resolved.  5.  Severe protein calorie malnutrition Dietitian following.  6.  Hypokalemia/hypophosphatemia Potassium at 3.3.  Magnesium at 1.8.  Replete potassium and magnesium.  Repeat labs in the morning.     DVT prophylaxis: SCDs Code Status: Full Family Communication: Updated patient.  No family at bedside. Disposition:   Status is: Inpatient    Dispo: The patient is from: Home              Anticipated d/c is to: SNF              Anticipated d/c date is: 2/8-06/2021              Patient currently with hypercalcemia, not stable for discharge.   Difficult to place patient no       Consultants:   Wound care nurse  Palliative care: Dr. Rowe Pavy 11/05/2020  Hematology/oncology: Dr. Irene Limbo 11/04/2020  Urology: Dr. Abner Greenspan 11/03/2020  Procedures:   CT abdomen and  pelvis 11/03/2020  Chest x-ray 11/03/2020  Plain films of bilateral feet 11/05/2020  Bone survey 11/11/2020  Abdominal films 11/16/2020, 11/22/2020  Antimicrobials:  Omnicef 11/05/2020>>>> 11/06/2020  Oral ciprofloxacin 11/06/2020>>>> 11/17/2020  IV Merrem 11/23/2020>>>> 12/03/2020   Subjective: Sleeping but arousable.  No chest  pain.  No shortness of breath.  No change in chronic abdominal pain.   Objective: Vitals:   12/04/20 0557 12/04/20 1347 12/04/20 2159 12/05/20 0644  BP: 121/77 103/61 (!) 98/52 112/63  Pulse: (!) 110 100 100 (!) 110  Resp:  18 18 16   Temp:  98 F (36.7 C) 98.9 F (37.2 C) 99.2 F (37.3 C)  TempSrc:  Oral Oral Oral  SpO2: 97% 94% 94% 95%  Weight:    54.2 kg  Height:        Intake/Output Summary (Last 24 hours) at 12/05/2020 1142 Last data filed at 12/05/2020 1020 Gross per 24 hour  Intake 2924.58 ml  Output 2975 ml  Net -50.42 ml   Filed Weights   11/23/20 1017 11/24/20 0508 12/05/20 0644  Weight: 59.9 kg 58.1 kg 54.2 kg    Examination:  General exam: : Cachectic.  Frail.  Chronically ill-appearing.   Respiratory system: Lungs auscultation bilaterally.  No wheezes, no crackles, no rhonchi.  Normal respiratory effort.  Cardiovascular system: RRR no murmurs rubs or gallops.  No JVD.  No lower extremity edema.  Gastrointestinal system: Abdomen is soft, nontender, nondistended, positive bowel sounds.  No rebound.  No guarding.  Ostomy bag with some air and minimal brown stool. Central nervous system: Alert and oriented. No focal neurological deficits. Extremities: Symmetric 5 x 5 power. Skin: No rashes, lesions or ulcers Psychiatry: Judgement and insight appear normal. Mood & affect appropriate.   Data Reviewed: I have personally reviewed following labs and imaging studies  CBC: Recent Labs  Lab 12/01/20 0324 12/02/20 0307 12/03/20 0330 12/04/20 0623 12/05/20 0320  WBC 18.1* 18.9* 20.1* 19.1* 17.3*  NEUTROABS 7.7 9.2* 10.9* 9.8* 9.3*  HGB 8.4* 8.5* 8.9* 8.4* 8.5*  HCT 27.4* 27.5* 28.6* 26.9* 27.5*  MCV 89.5 89.0 88.3 87.6 89.3  PLT 433* 433* 394 399 998    Basic Metabolic Panel: Recent Labs  Lab 12/01/20 0324 12/02/20 0307 12/03/20 0330 12/04/20 0623 12/05/20 0320  NA 134* 136 132* 135 136  K 3.6 3.4* 3.2* 3.8 3.3*  CL 106 106 105 107 106  CO2 21* 21* 21*  22 22  GLUCOSE 86 85 96 97 86  BUN 16 14 12 12 12   CREATININE 0.84 0.87 0.89 0.98 0.83  CALCIUM 12.4* 12.0* 11.6* 12.0* 11.8*  MG 1.7 2.1 1.8 2.1 1.8  PHOS 2.3* 3.0 2.4*  --  2.7    GFR: Estimated Creatinine Clearance: 68 mL/min (by C-G formula based on SCr of 0.83 mg/dL).  Liver Function Tests: Recent Labs  Lab 12/01/20 0324 12/02/20 0307 12/03/20 0330 12/05/20 0320  ALBUMIN 1.9* 2.0* 2.0* 2.0*    CBG: Recent Labs  Lab 11/30/20 0640 12/01/20 0442 12/04/20 0517 12/05/20 0632  GLUCAP 92 80 99 87     No results found for this or any previous visit (from the past 240 hour(s)).       Radiology Studies: No results found.      Scheduled Meds: . (feeding supplement) PROSource Plus  30 mL Oral TID BM  . B-complex with vitamin C  1 tablet Oral Daily  . Chlorhexidine Gluconate Cloth  6 each Topical Daily  . feeding supplement  1 Container Oral TID  BM  . folic acid  1 mg Oral Daily  . furosemide  40 mg Intravenous Daily  . hydrocerin   Topical BID  . nicotine  14 mg Transdermal Daily  . polyethylene glycol  17 g Oral Daily  . senna-docusate  1 tablet Oral QHS  . tamsulosin  0.4 mg Oral Nightly  . cyanocobalamin  1,000 mcg Oral Daily   Continuous Infusions: . sodium chloride 125 mL/hr at 12/05/20 0600  . magnesium sulfate bolus IVPB 4 g (12/05/20 1010)  . potassium chloride       LOS: 32 days    Time spent: 35 minutes    Irine Seal, MD [facility]   To contact the attending provider between 7A-7P or the covering provider during after hours 7P-7A, please log into the web site [email or web address] and access using universal [place] password for that web site. If you do not have the password, please call the hospital operator.  12/05/2020, 11:42 AM

## 2020-12-05 NOTE — TOC Progression Note (Signed)
Transition of Care [facility]) - Progression Note    Patient Details  Name: Jamie Wilson MRN: [id number] Date of Birth: Feb 28, 1956  Transition [facility] Care [facility] CM/SW Contact  Joaquin Courts, RN Phone Number: 12/05/2020, 10:01 AM  Clinical Narrative:    CM spoke with [facility] rep Misty, patient not ready for dc today, anticipate will be ready tomorrow.  Facility is aware.  MD plans to order repeat Covid test today.    Expected Discharge Plan: Skilled Nursing Facility Barriers to Discharge: Continued Medical Work up  Expected Discharge Plan and Services Expected Discharge Plan: [place]         Expected Discharge Date:  (unknown)                                     Social Determinants of Health (SDOH) Interventions    Readmission Risk Interventions Readmission Risk Prevention Plan 10/04/2020  Transportation Screening Complete  PCP or Specialist Appt within 5-7 Days Complete  Home Care Screening Complete  Medication Review (RN CM) Complete

## 2020-12-06 DIAGNOSIS — C679 Malignant neoplasm of bladder, unspecified: Secondary | ICD-10-CM | POA: Diagnosis not present

## 2020-12-06 DIAGNOSIS — A499 Bacterial infection, unspecified: Secondary | ICD-10-CM | POA: Diagnosis not present

## 2020-12-06 DIAGNOSIS — E43 Unspecified severe protein-calorie malnutrition: Secondary | ICD-10-CM | POA: Diagnosis not present

## 2020-12-06 LAB — RENAL FUNCTION PANEL
Albumin: 2 g/dL — ABNORMAL LOW (ref 3.5–5.0)
Anion gap: 8 (ref 5–15)
BUN: 12 mg/dL (ref 8–23)
CO2: 22 mmol/L (ref 22–32)
Calcium: 12.5 mg/dL — ABNORMAL HIGH (ref 8.9–10.3)
Chloride: 104 mmol/L (ref 98–111)
Creatinine, Ser: 0.96 mg/dL (ref 0.61–1.24)
GFR, Estimated: >60 mL/min (ref >60)
Glucose, Bld: 95 mg/dL (ref 70–99)
Phosphorus: 2.6 mg/dL (ref 2.5–4.6)
Potassium: 3.3 mmol/L — ABNORMAL LOW (ref 3.5–5.1)
Sodium: 134 mmol/L — ABNORMAL LOW (ref 135–145)

## 2020-12-06 LAB — CBC
HCT: 26.8 % — ABNORMAL LOW (ref 39.0–52.0)
Hemoglobin: 8.3 g/dL — ABNORMAL LOW (ref 13.0–17.0)
MCH: 27.2 pg (ref 26.0–34.0)
MCHC: 31 g/dL (ref 30.0–36.0)
MCV: 87.9 fL (ref 80.0–100.0)
Platelets: 386 10*3/uL (ref 150–400)
RBC: 3.05 MIL/uL — ABNORMAL LOW (ref 4.22–5.81)
RDW: 15.8 % — ABNORMAL HIGH (ref 11.5–15.5)
WBC: 19.9 10*3/uL — ABNORMAL HIGH (ref 4.0–10.5)
nRBC: 0 % (ref 0.0–0.2)

## 2020-12-06 LAB — MAGNESIUM: Magnesium: 2.3 mg/dL (ref 1.7–2.4)

## 2020-12-06 LAB — GLUCOSE, CAPILLARY: Glucose-Capillary: 88 mg/dL (ref 70–99)

## 2020-12-06 MED ORDER — ZOLEDRONIC ACID 4 MG/5ML IV CONC
4.0000 mg | Freq: Once | INTRAVENOUS | Status: AC
  Administered 2020-12-06: 10:00:00 4 mg via INTRAVENOUS
  Filled 2020-12-06: qty 5

## 2020-12-06 MED ORDER — POTASSIUM CHLORIDE 20 MEQ PO PACK
40.0000 meq | PACK | ORAL | Status: DC
  Administered 2020-12-06: 40 meq via ORAL
  Filled 2020-12-06 (×2): qty 2

## 2020-12-06 MED ORDER — CALCITONIN (SALMON) 200 UNIT/ML IJ SOLN: 4.0000 [IU]/kg | Freq: Two times a day (BID) | INTRAMUSCULAR | Status: DC

## 2020-12-06 MED ORDER — SODIUM CHLORIDE 0.9 % IV SOLN
12.5000 mg | Freq: Once | INTRAVENOUS | Status: AC
  Administered 2020-12-06: 12.5 mg via INTRAVENOUS
  Filled 2020-12-06: qty 0.25

## 2020-12-06 MED ORDER — ALBUMIN HUMAN 25 % IV SOLN
25.0000 g | Freq: Four times a day (QID) | INTRAVENOUS | Status: AC
  Administered 2020-12-06 – 2020-12-07 (×3): 25 g via INTRAVENOUS
  Administered 2020-12-07 (×2): 12.5 g via INTRAVENOUS
  Filled 2020-12-06 (×4): qty 100

## 2020-12-06 MED ORDER — DRONABINOL 2.5 MG PO CAPS
2.5000 mg | ORAL_CAPSULE | Freq: Two times a day (BID) | ORAL | Status: DC
  Administered 2020-12-06 – 2020-12-20 (×27): 2.5 mg via ORAL
  Filled 2020-12-06 (×27): qty 1

## 2020-12-06 MED ORDER — POTASSIUM CHLORIDE 10 MEQ/100ML IV SOLN
10.0000 meq | INTRAVENOUS | Status: AC
  Administered 2020-12-06 (×4): 10 meq via INTRAVENOUS
  Filled 2020-12-06 (×2): qty 100

## 2020-12-06 NOTE — TOC Progression Note (Signed)
Transition of Care [facility]) - Progression Note    Patient Details  Name: Jamie Wilson MRN: [id number] Date of Birth: 08-08-1956  Transition of Care [facility]) CM/SW Contact  Joaquin Courts, RN Phone Number: 12/06/2020, 10:08 AM  Clinical Narrative:    Per conversation with MD patient is not medically ready for dc to snf at this time.  TOC will continue to follow, possibly ready 2-3 days.   Expected Discharge Plan: Skilled Nursing Facility Barriers to Discharge: Continued Medical Work up  Expected Discharge Plan and Services Expected Discharge Plan: [place]         Expected Discharge Date:  (unknown)                                     Social Determinants of Health (SDOH) Interventions    Readmission Risk Interventions Readmission Risk Prevention Plan 10/04/2020  Transportation Screening Complete  PCP or Specialist Appt within 5-7 Days Complete  Home Care Screening Complete  Medication Review (RN CM) Complete

## 2020-12-06 NOTE — Progress Notes (Signed)
PT Cancellation Note  Patient Details Name: Jamie Wilson MRN: [id number] DOB: 02-28-1956   Cancelled Treatment:     pt declined any OOB activity despite MAX encouragement and redirection to importance of mobility.  "I just got my pillows adjusted right where I want them".  Pt also complaining about his K+ meds making him sick.  Pt did amb for me last week but for most part is also refusing OOB to recliner with nursing staff.  Will continue to fallow and attempt another day as schedule permits.  Pt has been rec for SNF.   Rica Koyanagi  PTA Acute  Rehabilitation Services Pager      [phone] Office      [phone]

## 2020-12-06 NOTE — Progress Notes (Signed)
PROGRESS NOTE    Jamie Wilson  ZOX:[id number] DOB: 03-Aug-1956 DOA: 11/02/2020 PCP: Patient, [name]    Chief Complaint  Patient presents with  . Pain  . Bladder Cancer    Brief Narrative:  Saud Bail is a 65 y.o.malewith medical history significant ofbladder CA w/ chronic foley placement; bowel obstruction s/p partial bowel resection w/ colostomy. Patient presented secondary to abdominal pain and found to have evidence of sepsis possibly secondary to necrotic bladder mass. Empiric antibiotics initiated. Urology and oncology consulted and recommendations given. Hospital course complicated by hypercalcemia. Bone scan does not show any lytic or blastic bone lesions suspicious for bone mets.AKI resolved. Still with some degree of hypercalcemia. He also had some GI symptoms including nausea, emesis and abdominal distention. KUB on 1/19 raise concern for SBO but GI symptoms resolved the next day.  Hospitalization further complicated by development of ESBL bacteremia.  He was started on IV Merrem.    Assessment & Plan:   Principal Problem:   ESBL (extended spectrum beta-lactamase) producing bacteria infection Active Problems:   Malnutrition (HCC)   Sepsis (HCC)   Protein-calorie malnutrition, severe   Lower urinary tract infectious disease   Palliative care by specialist   Goals of care, counseling/discussion  1 sepsis secondary to ESBL E. coli bacteremia Likely secondary to underlying urothelial carcinoma of the bladder/necrotic mass with chronic indwelling Foley catheter use. Patient noted to have sepsis on presentation, status post completion of 2 weeks of ciprofloxacin per urology recommendations. Repeat blood cultures concerning for ESBL E. coli bacteremia. Repeat blood cultures (11/25/2020) with no growth to date. Patient currently afebrile. Status post full course (10DAYS) IV Merrem.  Discussed with ID.   2.  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 Was seen by urology. Outpatient follow-up by urology for radical cystectomy, Dr. Jeffie Pollock Outpatient follow-up with hematology/oncology 2 weeks post discharge with Dr.Kale.  3.  Hypercalcemia of malignancy Status post Zometa.  Patient underwent bone survey during this hospitalization which was negative for metastatic disease.  Calcium levels were initially trending down but started to trend back up.  Patient status post IV Lasix 40 mg in addition to IV fluids with improvement with calcium levels which were trending down but trending back up.  Corrected calcium today at 14.10.  Patient with a urine output of 5.6 L over the past 24 hours.  Continue IV fluids, daily IV Lasix.   We will give another dose of Zometa.  Patient advised by Dr. Maylene Roes to stop dairy/milk products that contain calcium.  Follow.  4.  Acute kidney injury Resolved.  5.  Severe protein calorie malnutrition Dietitian following.  Patient with poor oral intake.  Will place on Marinol for appetite stimulant.  6.  Hypokalemia/hypophosphatemia Potassium at 3.3.  Magnesium at 2.3.  KCl 10 mEq IV every 1 hour x4 rounds.  Repeat labs in the morning.     DVT prophylaxis: SCDs Code Status: Full Family Communication: Updated patient.  No family at bedside. Disposition:   Status is: Inpatient    Dispo: The patient is from: Home              Anticipated d/c is to: SNF              Anticipated d/c date is: 2 to 3 days.              Patient currently with hypercalcemia, not stable for discharge.   Difficult to place  patient no       Consultants:   Wound care nurse  Palliative care: Dr. Rowe Pavy 11/05/2020  Hematology/oncology: Dr. Irene Limbo 11/04/2020  Urology: Dr. Abner Greenspan 11/03/2020  Procedures:   CT abdomen and pelvis 11/03/2020  Chest x-ray 11/03/2020  Plain films of bilateral feet 11/05/2020  Bone survey 11/11/2020  Abdominal films 11/16/2020, 11/22/2020  Antimicrobials:  Omnicef  11/05/2020>>>> 11/06/2020  Oral ciprofloxacin 11/06/2020>>>> 11/17/2020  IV Merrem 11/23/2020>>>> 12/03/2020   Subjective: Sleeping, arousable.  No chest pain.  No shortness of breath.  No change in chronic abdominal pain.  Poor oral intake.   Objective: Vitals:   12/05/20 0644 12/05/20 1326 12/05/20 2127 12/06/20 0504  BP: 112/63 102/65 111/65 122/72  Pulse: (!) 110 (!) 110 (!) 103 (!) 110  Resp: 16 18 18 18   Temp: 99.2 F (37.3 C) 98.5 F (36.9 C) 98.6 F (37 C) 98.7 F (37.1 C)  TempSrc: Oral Oral Oral Oral  SpO2: 95% 95% 95% 100%  Weight: 54.2 kg     Height:        Intake/Output Summary (Last 24 hours) at 12/06/2020 1248 Last data filed at 12/06/2020 1221 Gross per 24 hour  Intake 4467.39 ml  Output 7225 ml  Net -2757.61 ml   Filed Weights   11/23/20 1017 11/24/20 0508 12/05/20 0644  Weight: 59.9 kg 58.1 kg 54.2 kg    Examination:  General exam: : Cachectic.  Frail.  Chronically ill-appearing.   Respiratory system: Lungs clear to auscultation bilaterally.  No wheezes, no crackles, no rhonchi.  Cardiovascular system: Regular rate rhythm no murmurs rubs or gallops.  No JVD.  No lower extremity edema.   Gastrointestinal system: Abdomen is soft, nontender, nondistended, positive bowel sounds.  No rebound.  No guarding.  Ostomy bag with minimal brown stool.   Central nervous system: Alert and oriented. No focal neurological deficits. Extremities: Symmetric 5 x 5 power. Skin: No rashes, lesions or ulcers Psychiatry: Judgement and insight appear normal. Mood & affect appropriate.   Data Reviewed: I have personally reviewed following labs and imaging studies  CBC: Recent Labs  Lab 12/01/20 0324 12/02/20 0307 12/03/20 0330 12/04/20 0623 12/05/20 0320 12/06/20 0513  WBC 18.1* 18.9* 20.1* 19.1* 17.3* 19.9*  NEUTROABS 7.7 9.2* 10.9* 9.8* 9.3*  --   HGB 8.4* 8.5* 8.9* 8.4* 8.5* 8.3*  HCT 27.4* 27.5* 28.6* 26.9* 27.5* 26.8*  MCV 89.5 89.0 88.3 87.6 89.3 87.9  PLT 433* 433*  394 399 382 793    Basic Metabolic Panel: Recent Labs  Lab 12/01/20 0324 12/02/20 0307 12/03/20 0330 12/04/20 0623 12/05/20 0320 12/06/20 0513  NA 134* 136 132* 135 136 134*  K 3.6 3.4* 3.2* 3.8 3.3* 3.3*  CL 106 106 105 107 106 104  CO2 21* 21* 21* 22 22 22   GLUCOSE 86 85 96 97 86 95  BUN 16 14 12 12 12 12   CREATININE 0.84 0.87 0.89 0.98 0.83 0.96  CALCIUM 12.4* 12.0* 11.6* 12.0* 11.8* 12.5*  MG 1.7 2.1 1.8 2.1 1.8 2.3  PHOS 2.3* 3.0 2.4*  --  2.7 2.6    GFR: Estimated Creatinine Clearance: 58.8 mL/min (by C-G formula based on SCr of 0.96 mg/dL).  Liver Function Tests: Recent Labs  Lab 12/01/20 0324 12/02/20 0307 12/03/20 0330 12/05/20 0320 12/06/20 0513  ALBUMIN 1.9* 2.0* 2.0* 2.0* 2.0*    CBG: Recent Labs  Lab 11/30/20 0640 12/01/20 0442 12/04/20 0517 12/05/20 0632 12/06/20 0453  GLUCAP 92 80 99 87 88     No  results found for this or any previous visit (from the past 240 hour(s)).       Radiology Studies: No results found.      Scheduled Meds: . (feeding supplement) PROSource Plus  30 mL Oral TID BM  . B-complex with vitamin C  1 tablet Oral Daily  . Chlorhexidine Gluconate Cloth  6 each Topical Daily  . feeding supplement  1 Container Oral TID BM  . folic acid  1 mg Oral Daily  . furosemide  40 mg Intravenous Daily  . hydrocerin   Topical BID  . nicotine  14 mg Transdermal Daily  . polyethylene glycol  17 g Oral Daily  . potassium chloride  40 mEq Oral Q4H  . senna-docusate  1 tablet Oral QHS  . tamsulosin  0.4 mg Oral Nightly  . cyanocobalamin  1,000 mcg Oral Daily   Continuous Infusions: . sodium chloride 125 mL/hr at 12/06/20 0708  . albumin human 25 g (12/06/20 0935)  . diphenhydrAMINE       LOS: 33 days    Time spent: 35 minutes    Irine Seal, MD [facility]   To contact the attending provider between 7A-7P or the covering provider during after hours 7P-7A, please log into the web site [email or web address]  and access using universal [place] password for that web site. If you do not have the password, please call the hospital operator.  12/06/2020, 12:48 PM

## 2020-12-07 DIAGNOSIS — Z1612 Extended spectrum beta lactamase (ESBL) resistance: Secondary | ICD-10-CM | POA: Diagnosis not present

## 2020-12-07 DIAGNOSIS — A499 Bacterial infection, unspecified: Secondary | ICD-10-CM | POA: Diagnosis not present

## 2020-12-07 LAB — CBC WITH DIFFERENTIAL/PLATELET
Abs Immature Granulocytes: 0.03 10*3/uL (ref 0.00–0.07)
Basophils Absolute: 0.1 10*3/uL (ref 0.0–0.1)
Basophils Relative: 1 %
Eosinophils Absolute: 5.8 10*3/uL — ABNORMAL HIGH (ref 0.0–0.5)
Eosinophils Relative: 36 %
HCT: 24 % — ABNORMAL LOW (ref 39.0–52.0)
Hemoglobin: 7.3 g/dL — ABNORMAL LOW (ref 13.0–17.0)
Immature Granulocytes: 0 %
Lymphocytes Relative: 11 %
Lymphs Abs: 1.7 10*3/uL (ref 0.7–4.0)
MCH: 27.1 pg (ref 26.0–34.0)
MCHC: 30.4 g/dL (ref 30.0–36.0)
MCV: 89.2 fL (ref 80.0–100.0)
Monocytes Absolute: 0.6 10*3/uL (ref 0.1–1.0)
Monocytes Relative: 4 %
Neutro Abs: 8 10*3/uL — ABNORMAL HIGH (ref 1.7–7.7)
Neutrophils Relative %: 48 %
Platelets: 328 10*3/uL (ref 150–400)
RBC: 2.69 MIL/uL — ABNORMAL LOW (ref 4.22–5.81)
RDW: 15.6 % — ABNORMAL HIGH (ref 11.5–15.5)
WBC: 16.3 10*3/uL — ABNORMAL HIGH (ref 4.0–10.5)
nRBC: 0 % (ref 0.0–0.2)

## 2020-12-07 LAB — RENAL FUNCTION PANEL
Albumin: 3.2 g/dL — ABNORMAL LOW (ref 3.5–5.0)
Anion gap: 8 (ref 5–15)
BUN: 12 mg/dL (ref 8–23)
CO2: 24 mmol/L (ref 22–32)
Calcium: 12.9 mg/dL — ABNORMAL HIGH (ref 8.9–10.3)
Chloride: 106 mmol/L (ref 98–111)
Creatinine, Ser: 1.03 mg/dL (ref 0.61–1.24)
GFR, Estimated: >60 mL/min (ref >60)
Glucose, Bld: 87 mg/dL (ref 70–99)
Phosphorus: 2.3 mg/dL — ABNORMAL LOW (ref 2.5–4.6)
Potassium: 3.5 mmol/L (ref 3.5–5.1)
Sodium: 138 mmol/L (ref 135–145)

## 2020-12-07 LAB — GLUCOSE, CAPILLARY: Glucose-Capillary: 89 mg/dL (ref 70–99)

## 2020-12-07 LAB — MAGNESIUM: Magnesium: 1.9 mg/dL (ref 1.7–2.4)

## 2020-12-07 MED ORDER — ENSURE ENLIVE PO LIQD
237.0000 mL | Freq: Two times a day (BID) | ORAL | Status: DC
  Administered 2020-12-10 – 2020-12-21 (×7): 237 mL via ORAL

## 2020-12-07 MED ORDER — PROSOURCE PLUS PO LIQD
30.0000 mL | Freq: Every day | ORAL | Status: DC
  Administered 2020-12-08 – 2020-12-20 (×12): 30 mL via ORAL
  Filled 2020-12-07 (×11): qty 30

## 2020-12-07 NOTE — Progress Notes (Signed)
Nutrition Follow-up  RD working remotely.  DOCUMENTATION CODES:   Severe malnutrition in context of chronic illness,Underweight  INTERVENTION:  - continue Boost Breeze TID. - will decrease Prosource from BID to once/day. - will order Ensure Enlive BID, each supplement provides 350 kcal and 20 grams of protein.  - if patient remains Full Code and if PO intakes continue to be <25%, recommend small bore NGT vs. PEG for TF to provide adequate nutrition.    NUTRITION DIAGNOSIS:   Severe Malnutrition related to chronic illness,cancer and cancer related treatments as evidenced by severe fat depletion,severe muscle depletion,percent weight loss. -ongoing  GOAL:   Patient will meet greater than or equal to 90% of their needs -unmet  MONITOR:   PO intake,Supplement acceptance,Labs,I & O's,Weight trends  ASSESSMENT:   65 y.o. 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 Patient presented secondary to abdominal pain and found to have evidence of sepsis possibly secondary to necrotic bladder mass.  Per flow sheet documentation, patient consumed 0% of lunch and dinner on 2/7; 0% of breakfast and lunch and 25% of dinner on 2/8; 0% of breakfast this AM.   Marinol was ordered starting at 1700 yesterday.  He has been accepting Boost Breeze ~50% of the time offered over the past 1 week (each supplement provides 250 kcal and 9 grams protein).  He has been accepting Prosource Plus ~25-50% of the time offered (each supplement provides 100 kcal and 15 grams protein).  Ensure Enlive had previously been ordered but was stopped d/t patient with hypercalcemia and desire by care team to not provide high Ca-containing foods and beverages.   Question if this practice needs to continue as patient is not eating and Ca is just as high, and even higher, than when he was receiving Ensure supplements.   Weight has  fluctuated significantly throughout hospitalization. Flow sheet documentation indicates no edema present over the past 5 days.   Per notes: - urothelial carcinoma of the bladder with chronic indwelling Foley - hypercalcemia of malignancy - severe PCM - from home with likely plan for SNF at d/c   Labs reviewed; Ca: 12.9 mg/dl, Phos: 2.3 mg/dl. Medications reviewed; 1 tablet vitamin B-complex with vitamin C/day, 2.5 mg marinol BID, 1 mg folvite/day, 40 mg IV lasix/day, 17 g miralax/day, 1 tablet senokot/day, 1000 mcg oral cyanocobalamin/day.  IVF; NS @ 125 ml/hr.   Diet Order:   Diet Order            DIET SOFT Room service appropriate? Yes; Fluid consistency: Thin  Diet effective now                 EDUCATION NEEDS:   No education needs have been identified at this time  Skin:  Skin Assessment: Reviewed RN Assessment  Last BM:  2/8  Height:   Ht Readings from Last 1 Encounters:  11/02/20 6\' 1"  (1.854 m)    Weight:   Wt Readings from Last 1 Encounters:  12/05/20 54.2 kg     Estimated Nutritional Needs:  Kcal:  2100-2300 Protein:  100-115g Fluid:  2.1L/day     Jarome Matin, MS, RD, LDN, CNSC Inpatient Clinical Dietitian RD pager # available in AMION  After hours/weekend pager # available in [facility]

## 2020-12-07 NOTE — Progress Notes (Signed)
PROGRESS NOTE    Jamie Wilson  PYK:[id number] DOB: 01/10/1956 DOA: 11/02/2020 PCP: Patient, [name]     Brief Narrative:  Jamie Wilson is a 65 y.o.malewith medical history significant ofbladder CA w/ chronic foley placement; bowel obstruction s/p partial bowel resection w/ colostomy. Patient presented secondary to abdominal pain and found to have evidence of sepsis possibly secondary to necrotic bladder mass. Empiric antibiotics initiated. Urology and oncology consulted and recommendations given. Hospital course complicated by hypercalcemia. Bone scan does not show any lytic or blastic bone lesions suspicious for bone mets.AKI resolved.  Still with some degree of hypercalcemia.  He also had some GI symptoms including nausea, emesis and abdominal distention.  KUB on 1/19 raise concern for SBO but GI symptoms resolved the next day.  Hospitalization further complicated by development of ESBL bacteremia.  He was started on IV Merrem and has completed 10-day course.  New events last 24 hours / Subjective: Patient complaining of intermittent bladder pain, requiring Foley catheter flushing.  States that he would rather stay at [facility], if discharged to skilled nursing facility states that they will not help him, they only have physicians available once a week, and he will come back to the hospital.  Seems to have very poor insight into his overall prognosis.  Per nursing staff, patient with very poor oral intake and failure to thrive.  Recommended for palliative care consultation.  Assessment & Plan:   Principal Problem:   ESBL (extended spectrum beta-lactamase) producing bacteria infection Active Problems:   Malnutrition (HCC)   Sepsis ([place])   Protein-calorie malnutrition, severe   Lower urinary tract infectious disease   Palliative care by specialist   Goals of care, counseling/discussion   Sepsis secondary to E. coli bacteremia -Likely due to underlying urothelial carcinoma of  the bladder with chronic indwelling Foley catheter use -Sepsis present on admission, had completed 2 weeks of Cipro per urology recommendation -Now with repeat blood culture showing E. coli bacteremia.  Completed 10-day course of IV Merrem. 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 Jeffie Pollock -Follow-up with oncology in 2 weeks, Dr. Irene Limbo   Hypercalcemia of malignancy -Status post zoledronic acid x 2  -Patient underwent bone survey during this hospitalization which was negative for metastatic disease -Initially improved, but then trending upward again -Continue lasix, IVF  -Trend BMP   AKI -Resolved  Severe protein calorie malnutrition -Estimated body mass index is 15.76 kg/m as calculated from the following:   Height as of this encounter: 6\' 1"  (1.854 m).   Weight as of this encounter: 54.2 kg. -Failure to thrive and poor PO intake -Palliative care consulted for goals of care discussion   DVT prophylaxis:  SCDs Start: 11/03/20 2329  Code Status: Full code Family Communication: No family at bedside Disposition Plan:  Status is: Inpatient  Remains inpatient appropriate because:Inpatient level of care appropriate due to severity of illness   Dispo: The patient is from: Home              Anticipated d/c is to: SNF              Anticipated d/c date is: 2 days              Patient currently is not medically stable to d/c.    Difficult to place patient No   Consultants:  Urology  Oncology  Palliative care medicine   Antimicrobials:  Anti-infectives (From admission, onward)   Start     Dose/Rate Route Frequency Ordered Stop   11/23/20 1000  meropenem (MERREM) 1 g in sodium chloride 0.9 % 100 mL IVPB        1 g 200 mL/hr over 30 Minutes Intravenous Every 8 hours 11/23/20 0937 12/03/20 1857   11/12/20  2000  ciprofloxacin (CIPRO) tablet 500 mg        500 mg Oral 2 times daily 11/12/20 1543 11/17/20 2359   11/10/20 0800  ciprofloxacin (CIPRO) tablet 500 mg  Status:  Discontinued        500 mg Oral Daily with breakfast 11/09/20 1357 11/12/20 1543   11/06/20 1200  ciprofloxacin (CIPRO) tablet 500 mg  Status:  Discontinued        500 mg Oral 2 times daily 11/06/20 1114 11/09/20 1357   11/05/20 1500  cefdinir (OMNICEF) capsule 300 mg  Status:  Discontinued        300 mg Oral Every 12 hours 11/05/20 1337 11/06/20 1114   11/03/20 2200  vancomycin (VANCOREADY) IVPB 500 mg/100 mL  Status:  Discontinued        500 mg 100 mL/hr over 60 Minutes Intravenous Every 12 hours 11/03/20 1402 11/05/20 1335   11/03/20 1415  piperacillin-tazobactam (ZOSYN) IVPB 3.375 g  Status:  Discontinued        3.375 g 12.5 mL/hr over 240 Minutes Intravenous Every 8 hours 11/03/20 1401 11/05/20 1337   11/03/20 0645  vancomycin (VANCOCIN) IVPB 1000 mg/200 mL premix        1,000 mg 200 mL/hr over 60 Minutes Intravenous  Once 11/03/20 0631 11/03/20 1147   11/03/20 0600  piperacillin-tazobactam (ZOSYN) IVPB 4.5 g  Status:  Discontinued        4.5 g 200 mL/hr over 30 Minutes Intravenous  Once 11/03/20 0546 11/03/20 0547   11/03/20 0600  piperacillin-tazobactam (ZOSYN) IVPB 3.375 g        3.375 g 100 mL/hr over 30 Minutes Intravenous To Emergency Dept 11/03/20 0548 11/03/20 0650       Objective: Vitals:   12/06/20 0504 12/06/20 1345 12/06/20 2014 12/07/20 0541  BP: 122/72 94/63 113/71 (!) 157/73  Pulse: (!) 110 93 81 80  Resp: 18 17 18 18   Temp: 98.7 F (37.1 C) 99.3 F (37.4 C) 98.5 F (36.9 C) 98.5 F (36.9 C)  TempSrc: Oral Oral Oral Oral  SpO2: 100% 95% 100% 100%  Weight:      Height:        Intake/Output Summary (Last 24 hours) at 12/07/2020 1333 Last data filed at 12/07/2020 1000 Gross per 24 hour  Intake 5028.54 ml  Output 4180 ml  Net 848.54 ml   Filed Weights   11/23/20 1017 11/24/20 0508 12/05/20  0644  Weight: 59.9 kg 58.1 kg 54.2 kg   Examination: General exam: Appears calm and comfortable, chronically ill, disheveled Respiratory system: Clear to auscultation. Respiratory effort normal. Cardiovascular system: S1 & S2 heard, RRR. No pedal edema. Gastrointestinal system: Abdomen is nondistended, soft and nontender. Normal bowel sounds heard. Central nervous system: Alert and oriented. Non focal exam. Speech clear  Extremities: Symmetric in appearance bilaterally  Skin: No rashes, lesions or ulcers on exposed skin    Data Reviewed: I have personally reviewed following labs and imaging studies  CBC: Recent Labs  Lab 12/02/20 0307 12/03/20 0330 12/04/20 0623 12/05/20 0320 12/06/20 0513 12/07/20 0418  WBC 18.9* 20.1* 19.1*  17.3* 19.9* 16.3*  NEUTROABS 9.2* 10.9* 9.8* 9.3*  --  8.0*  HGB 8.5* 8.9* 8.4* 8.5* 8.3* 7.3*  HCT 27.5* 28.6* 26.9* 27.5* 26.8* 24.0*  MCV 89.0 88.3 87.6 89.3 87.9 89.2  PLT 433* 394 399 382 386 742   Basic Metabolic Panel: Recent Labs  Lab 12/02/20 0307 12/03/20 0330 12/04/20 0623 12/05/20 0320 12/06/20 0513 12/07/20 0418  NA 136 132* 135 136 134* 138  K 3.4* 3.2* 3.8 3.3* 3.3* 3.5  CL 106 105 107 106 104 106  CO2 21* 21* 22 22 22 24   GLUCOSE 85 96 97 86 95 87  BUN 14 12 12 12 12 12   CREATININE 0.87 0.89 0.98 0.83 0.96 1.03  CALCIUM 12.0* 11.6* 12.0* 11.8* 12.5* 12.9*  MG 2.1 1.8 2.1 1.8 2.3 1.9  PHOS 3.0 2.4*  --  2.7 2.6 2.3*   GFR: Estimated Creatinine Clearance: 54.8 mL/min (by C-G formula based on SCr of 1.03 mg/dL). Liver Function Tests: Recent Labs  Lab 12/02/20 0307 12/03/20 0330 12/05/20 0320 12/06/20 0513 12/07/20 0418  ALBUMIN 2.0* 2.0* 2.0* 2.0* 3.2*   No results for input(s): LIPASE, AMYLASE in the last 168 hours. No results for input(s): AMMONIA in the last 168 hours. Coagulation Profile: No results for input(s): INR, PROTIME in the last 168 hours. Cardiac Enzymes: No results for input(s): CKTOTAL, CKMB,  CKMBINDEX, TROPONINI in the last 168 hours. BNP (last 3 results) No results for input(s): PROBNP in the last 8760 hours. HbA1C: No results for input(s): HGBA1C in the last 72 hours. CBG: Recent Labs  Lab 12/01/20 0442 12/04/20 0517 12/05/20 0632 12/06/20 0453 12/07/20 0704  GLUCAP 80 99 87 88 89   Lipid Profile: No results for input(s): CHOL, HDL, LDLCALC, TRIG, CHOLHDL, LDLDIRECT in the last 72 hours. Thyroid Function Tests: No results for input(s): TSH, T4TOTAL, FREET4, T3FREE, THYROIDAB in the last 72 hours. Anemia Panel: No results for input(s): VITAMINB12, FOLATE, FERRITIN, TIBC, IRON, RETICCTPCT in the last 72 hours. Sepsis Labs: No results for input(s): PROCALCITON, LATICACIDVEN in the last 168 hours.  No results found for this or any previous visit (from the past 240 hour(s)).    Radiology Studies: No results found.    Scheduled Meds: . (feeding supplement) PROSource Plus  30 mL Oral TID BM  . B-complex with vitamin C  1 tablet Oral Daily  . Chlorhexidine Gluconate Cloth  6 each Topical Daily  . dronabinol  2.5 mg Oral BID AC  . feeding supplement  1 Container Oral TID BM  . folic acid  1 mg Oral Daily  . furosemide  40 mg Intravenous Daily  . hydrocerin   Topical BID  . nicotine  14 mg Transdermal Daily  . polyethylene glycol  17 g Oral Daily  . senna-docusate  1 tablet Oral QHS  . tamsulosin  0.4 mg Oral Nightly  . cyanocobalamin  1,000 mcg Oral Daily   Continuous Infusions: . sodium chloride 125 mL/hr at 12/07/20 1152     LOS: 34 days      Time spent: 25 minutes   Dessa Phi, DO [facility] 12/07/2020, 1:33 PM   Available via Epic secure chat 7am-7pm After these hours, please refer to coverage provider listed on [email or web address]

## 2020-12-08 DIAGNOSIS — Z1612 Extended spectrum beta lactamase (ESBL) resistance: Secondary | ICD-10-CM | POA: Diagnosis not present

## 2020-12-08 DIAGNOSIS — A499 Bacterial infection, unspecified: Secondary | ICD-10-CM | POA: Diagnosis not present

## 2020-12-08 LAB — CBC
HCT: 23.6 % — ABNORMAL LOW (ref 39.0–52.0)
Hemoglobin: 7.2 g/dL — ABNORMAL LOW (ref 13.0–17.0)
MCH: 27.2 pg (ref 26.0–34.0)
MCHC: 30.5 g/dL (ref 30.0–36.0)
MCV: 89.1 fL (ref 80.0–100.0)
Platelets: 324 10*3/uL (ref 150–400)
RBC: 2.65 MIL/uL — ABNORMAL LOW (ref 4.22–5.81)
RDW: 15.4 % (ref 11.5–15.5)
WBC: 16.4 10*3/uL — ABNORMAL HIGH (ref 4.0–10.5)
nRBC: 0 % (ref 0.0–0.2)

## 2020-12-08 LAB — BASIC METABOLIC PANEL
Anion gap: 9 (ref 5–15)
BUN: 12 mg/dL (ref 8–23)
CO2: 22 mmol/L (ref 22–32)
Calcium: 12.1 mg/dL — ABNORMAL HIGH (ref 8.9–10.3)
Chloride: 106 mmol/L (ref 98–111)
Creatinine, Ser: 1.11 mg/dL (ref 0.61–1.24)
GFR, Estimated: >60 mL/min (ref >60)
Glucose, Bld: 91 mg/dL (ref 70–99)
Potassium: 2.8 mmol/L — ABNORMAL LOW (ref 3.5–5.1)
Sodium: 137 mmol/L (ref 135–145)

## 2020-12-08 LAB — GLUCOSE, CAPILLARY: Glucose-Capillary: 94 mg/dL (ref 70–99)

## 2020-12-08 MED ORDER — POTASSIUM CHLORIDE 20 MEQ PO PACK
40.0000 meq | PACK | Freq: Once | ORAL | Status: AC
  Administered 2020-12-08: 40 meq via ORAL
  Filled 2020-12-08: qty 2

## 2020-12-08 MED ORDER — POTASSIUM CHLORIDE IN NACL 40-0.9 MEQ/L-% IV SOLN
INTRAVENOUS | Status: DC
  Filled 2020-12-08 (×4): qty 1000

## 2020-12-08 NOTE — Progress Notes (Signed)
PROGRESS NOTE    Jamie Wilson  PRF:[id number] DOB: 18-Nov-1955 DOA: 11/02/2020 PCP: Patient, [name]     Brief Narrative:  Jamie Wilson is a 65 y.o.malewith medical history significant ofbladder CA w/ chronic foley placement; bowel obstruction s/p partial bowel resection w/ colostomy. Patient presented secondary to abdominal pain and found to have evidence of sepsis possibly secondary to necrotic bladder mass. Empiric antibiotics initiated. Urology and oncology consulted and recommendations given. Hospital course complicated by hypercalcemia. Bone scan does not show any lytic or blastic bone lesions suspicious for bone mets.AKI resolved.  Still with some degree of hypercalcemia.  He also had some GI symptoms including nausea, emesis and abdominal distention.  KUB on 1/19 raise concern for SBO but GI symptoms resolved the next day.  Hospitalization further complicated by development of ESBL bacteremia.  He was started on IV Merrem and has completed 10-day course.  New events last 24 hours / Subjective: Had a discussion with him regarding his lack of improvement. He has had very poor PO intake, no appetite and severe malnutrition. He has been taking in 0-25% of his meal trays, associated with severe electrolyte disturbances. His BMI is 15.76. When asked about a feeding tube, he states that he would not want to pursue it. I discussed with him that with his malnutrition, he is likely not a good candidate for any surgical procedure for his bladder cancer. I asked him if he had thought about end of life and hospice and he admitted that he has thought about it, and says "if that's what it comes to, then it's what it is." He states that he has no close family and his roommate and friend has not visited and does not care about him. I discussed with him my recommendation for palliative care medicine consultation and to think about hospice.   Assessment & Plan:   Principal Problem:   ESBL (extended spectrum  beta-lactamase) producing bacteria infection Active Problems:   Malnutrition (HCC)   Sepsis ([place])   Protein-calorie malnutrition, severe   Lower urinary tract infectious disease   Palliative care by specialist   Goals of care, counseling/discussion   Sepsis secondary to E. 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 coli bacteremia.  Completed 10-day course of IV Merrem. 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 Jeffie Pollock -Follow-up with oncology in 2 weeks, Dr. Irene Limbo   Hypercalcemia of malignancy -Status post zoledronic acid x 2  -Patient underwent bone survey during this hospitalization which was negative for metastatic disease -Initially improved, but then trending upward again -Continue lasix, IVF  -Trend BMP   AKI -Resolved  Severe protein calorie malnutrition -Estimated body mass index is 15.76 kg/m as calculated from the following:   Height as of this encounter: 6\' 1"  (1.854 m).   Weight as of this encounter: 54.2 kg. -Failure to thrive and poor PO intake -Palliative care consulted for goals of care discussion  Hypokalemia -Replace, trend    DVT prophylaxis:  SCDs Start: 11/03/20 2329  Code Status: Full code Family Communication: No family at bedside Disposition Plan:  Status is: Inpatient  Remains inpatient appropriate because:Inpatient level of care appropriate due to severity of illness  Dispo: The patient is from: Home              Anticipated d/c is to: SNF              Anticipated d/c date is: 2 days              Patient currently is not medically stable to d/c.    Difficult to  place patient No   Consultants:   Urology  Oncology  Palliative care medicine   Antimicrobials:  Anti-infectives (From admission, onward)   Start     Dose/Rate Route Frequency Ordered Stop   11/23/20 1000  meropenem (MERREM) 1 g in sodium chloride 0.9 % 100 mL IVPB        1 g 200 mL/hr over 30 Minutes Intravenous Every 8 hours 11/23/20 0937 12/03/20 1857   11/12/20 2000  ciprofloxacin (CIPRO) tablet 500 mg        500 mg Oral 2 times daily 11/12/20 1543 11/17/20 2359   11/10/20 0800  ciprofloxacin (CIPRO) tablet 500 mg  Status:  Discontinued        500 mg Oral Daily with breakfast 11/09/20 1357 11/12/20 1543   11/06/20 1200  ciprofloxacin (CIPRO) tablet 500 mg  Status:  Discontinued        500 mg Oral 2 times daily 11/06/20 1114 11/09/20 1357   11/05/20 1500  cefdinir (OMNICEF) capsule 300 mg  Status:  Discontinued        300 mg Oral Every 12 hours 11/05/20 1337 11/06/20 1114   11/03/20 2200  vancomycin (VANCOREADY) IVPB 500 mg/100 mL  Status:  Discontinued        500 mg 100 mL/hr over 60 Minutes Intravenous Every 12 hours 11/03/20 1402 11/05/20 1335   11/03/20 1415  piperacillin-tazobactam (ZOSYN) IVPB 3.375 g  Status:  Discontinued        3.375 g 12.5 mL/hr over 240 Minutes Intravenous Every 8 hours 11/03/20 1401 11/05/20 1337   11/03/20 0645  vancomycin (VANCOCIN) IVPB 1000 mg/200 mL premix        1,000 mg 200 mL/hr over 60 Minutes Intravenous  Once 11/03/20 0631 11/03/20 1147   11/03/20 0600  piperacillin-tazobactam (ZOSYN) IVPB 4.5 g  Status:  Discontinued        4.5 g 200 mL/hr over 30 Minutes Intravenous  Once 11/03/20 0546 11/03/20 0547   11/03/20 0600  piperacillin-tazobactam (ZOSYN) IVPB 3.375 g        3.375 g 100 mL/hr over 30 Minutes Intravenous To Emergency Dept 11/03/20 0548 11/03/20 0650       Objective: Vitals:   12/07/20 0541 12/07/20 1417 12/07/20 2226 12/08/20 0606  BP: (!) 157/73 101/67 (!) 114/59 118/66  Pulse: 80 (!) 110 (!) 110 61  Resp: 18 16  16 17   Temp: 98.5 F (36.9 C) 98.5 F (36.9 C) 99.3 F (37.4 C) 99 F (37.2 C)  TempSrc: Oral Oral Oral Oral  SpO2: 100% 95% 97% 96%  Weight:      Height:        Intake/Output Summary (Last 24 hours) at 12/08/2020 0844 Last data filed at 12/08/2020 0600 Gross per 24 hour  Intake 3935.21 ml  Output 3850 ml  Net 85.21 ml   Filed Weights   11/23/20 1017 11/24/20 0508 12/05/20 0644  Weight: 59.9 kg 58.1 kg 54.2 kg     Examination: General exam: Appears calm and comfortable, frail and chronically ill appearing  Respiratory system: Clear to auscultation. Respiratory effort normal. Cardiovascular system:  S1 & S2 heard, RRR. No pedal edema. Gastrointestinal system: Abdomen is nondistended, soft and nontender. Normal bowel sounds heard. Central nervous system: Alert and oriented. Non focal exam. Speech clear  Extremities: Symmetric in appearance bilaterally  Skin: No rashes, lesions or ulcers on exposed skin  Psychiatry: Judgement and insight appear stable. Mood & affect appropriate.     Data Reviewed: I have personally reviewed following labs and imaging studies  CBC: Recent Labs  Lab 12/02/20 0307 12/03/20 0330 12/04/20 7371 12/05/20 0320 12/06/20 0513 12/07/20 0418 12/08/20 0323  WBC 18.9* 20.1* 19.1* 17.3* 19.9* 16.3* 16.4*  NEUTROABS 9.2* 10.9* 9.8* 9.3*  --  8.0*  --   HGB 8.5* 8.9* 8.4* 8.5* 8.3* 7.3* 7.2*  HCT 27.5* 28.6* 26.9* 27.5* 26.8* 24.0* 23.6*  MCV 89.0 88.3 87.6 89.3 87.9 89.2 89.1  PLT 433* 394 399 382 386 328 062   Basic Metabolic Panel: Recent Labs  Lab 12/02/20 0307 12/03/20 0330 12/04/20 0623 12/05/20 0320 12/06/20 0513 12/07/20 0418 12/08/20 0323  NA 136 132* 135 136 134* 138 137  K 3.4* 3.2* 3.8 3.3* 3.3* 3.5 2.8*  CL 106 105 107 106 104 106 106  CO2 21* 21* 22 22 22 24 22   GLUCOSE 85 96 97 86 95 87 91  BUN 14 12 12 12 12 12 12   CREATININE 0.87 0.89 0.98 0.83 0.96 1.03 1.11  CALCIUM 12.0* 11.6* 12.0* 11.8* 12.5* 12.9* 12.1*  MG  2.1 1.8 2.1 1.8 2.3 1.9  --   PHOS 3.0 2.4*  --  2.7 2.6 2.3*  --    GFR: Estimated Creatinine Clearance: 50.9 mL/min (by C-G formula based on SCr of 1.11 mg/dL). Liver Function Tests: Recent Labs  Lab 12/02/20 0307 12/03/20 0330 12/05/20 0320 12/06/20 0513 12/07/20 0418  ALBUMIN 2.0* 2.0* 2.0* 2.0* 3.2*   No results for input(s): LIPASE, AMYLASE in the last 168 hours. No results for input(s): AMMONIA in the last 168 hours. Coagulation Profile: No results for input(s): INR, PROTIME in the last 168 hours. Cardiac Enzymes: No results for input(s): CKTOTAL, CKMB, CKMBINDEX, TROPONINI in the last 168 hours. BNP (last 3 results) No results for input(s): PROBNP in the last 8760 hours. HbA1C: No results for input(s): HGBA1C in the last 72 hours. CBG: Recent Labs  Lab 12/04/20 0517 12/05/20 6948 12/06/20 0453 12/07/20 0704 12/08/20 0633  GLUCAP 99 87 88 89 94   Lipid Profile: No results for input(s): CHOL, HDL, LDLCALC, TRIG, CHOLHDL, LDLDIRECT in the last 72 hours. Thyroid Function Tests: No results for input(s): TSH, T4TOTAL, FREET4, T3FREE, THYROIDAB in the last 72 hours. Anemia Panel: No results for input(s): VITAMINB12, FOLATE, FERRITIN, TIBC, IRON, RETICCTPCT in the last 72 hours. Sepsis Labs: No results for input(s): PROCALCITON, LATICACIDVEN in the last 168 hours.  No results found for this or any previous visit (from the past 240 hour(s)).    Radiology Studies: No results found.    Scheduled Meds: . (feeding supplement) PROSource Plus  30 mL Oral Daily  . B-complex with vitamin C  1 tablet Oral Daily  . Chlorhexidine Gluconate Cloth  6 each Topical Daily  . dronabinol  2.5 mg Oral BID AC  . feeding supplement  1 Container Oral TID BM  . feeding supplement  237 mL Oral BID BM  . folic acid  1 mg Oral Daily  . furosemide  40 mg Intravenous Daily  . hydrocerin   Topical BID  . nicotine  14 mg Transdermal Daily  . polyethylene glycol  17 g Oral Daily  .  potassium chloride  40 mEq Oral Once  . senna-docusate  1 tablet Oral QHS  . tamsulosin  0.4 mg Oral Nightly  . cyanocobalamin  1,000 mcg Oral Daily   Continuous Infusions: . 0.9 % NaCl with KCl 40 mEq / L       LOS: 35 days      Time spent: 25 minutes   Dessa Phi, DO [facility] 12/08/2020, 8:44 AM   Available via Epic secure chat 7am-7pm After these hours, please refer to coverage provider listed on [email or web address]

## 2020-12-08 NOTE — Progress Notes (Signed)
   12/08/20 1100  Mobility  Activity Ambulated in room  Level of Assistance Minimal assist, patient does 75% or more  Assistive Device Front wheel walker  Distance Ambulated (ft) 10 ft  Mobility Response Tolerated well  Mobility performed by Mobility specialist  $Mobility charge 1 Mobility   Mobility Specialist: Progress Note   Pre-Mobility: 111 HR, 91% SpO2 During Mobility: 134 HR, 98% SpO2 Post-Mobility: 130 HR, 94% SpO2  Pt tolerated ambulation well. Ambulated in room from bed to door way. Ended in chair with call bell in hand and chair alarm on and set. No complaints of SOB or pain during ambulation. Pt stated feeling overall "weak". RN notified.   Krystal Delduca Mobility Specialist/Rehab Tech [facility] [employer]

## 2020-12-08 NOTE — Progress Notes (Signed)
Patient has critical potassium level this AM of 2.8 from 3.5 yesterday. Was not notified by lab. Notified NP on call of patient's level. No new orders at this time. Dawson Bills

## 2020-12-09 DIAGNOSIS — C679 Malignant neoplasm of bladder, unspecified: Secondary | ICD-10-CM | POA: Diagnosis not present

## 2020-12-09 DIAGNOSIS — A499 Bacterial infection, unspecified: Secondary | ICD-10-CM | POA: Diagnosis not present

## 2020-12-09 DIAGNOSIS — Z1612 Extended spectrum beta lactamase (ESBL) resistance: Secondary | ICD-10-CM | POA: Diagnosis not present

## 2020-12-09 DIAGNOSIS — Z7189 Other specified counseling: Secondary | ICD-10-CM | POA: Diagnosis not present

## 2020-12-09 LAB — BASIC METABOLIC PANEL
Anion gap: 5 (ref 5–15)
BUN: 13 mg/dL (ref 8–23)
CO2: 23 mmol/L (ref 22–32)
Calcium: 12.1 mg/dL — ABNORMAL HIGH (ref 8.9–10.3)
Chloride: 107 mmol/L (ref 98–111)
Creatinine, Ser: 1.02 mg/dL (ref 0.61–1.24)
GFR, Estimated: >60 mL/min (ref >60)
Glucose, Bld: 98 mg/dL (ref 70–99)
Potassium: 3.3 mmol/L — ABNORMAL LOW (ref 3.5–5.1)
Sodium: 135 mmol/L (ref 135–145)

## 2020-12-09 LAB — CBC
HCT: 24.6 % — ABNORMAL LOW (ref 39.0–52.0)
Hemoglobin: 7.5 g/dL — ABNORMAL LOW (ref 13.0–17.0)
MCH: 27.3 pg (ref 26.0–34.0)
MCHC: 30.5 g/dL (ref 30.0–36.0)
MCV: 89.5 fL (ref 80.0–100.0)
Platelets: 353 10*3/uL (ref 150–400)
RBC: 2.75 MIL/uL — ABNORMAL LOW (ref 4.22–5.81)
RDW: 15.3 % (ref 11.5–15.5)
WBC: 18.7 10*3/uL — ABNORMAL HIGH (ref 4.0–10.5)
nRBC: 0 % (ref 0.0–0.2)

## 2020-12-09 LAB — MAGNESIUM: Magnesium: 1.7 mg/dL (ref 1.7–2.4)

## 2020-12-09 LAB — GLUCOSE, CAPILLARY: Glucose-Capillary: 97 mg/dL (ref 70–99)

## 2020-12-09 MED ORDER — HYDROXYZINE HCL 50 MG PO TABS
50.0000 mg | ORAL_TABLET | Freq: Three times a day (TID) | ORAL | Status: DC | PRN
  Filled 2020-12-09: qty 2

## 2020-12-09 MED ORDER — POTASSIUM CHLORIDE 20 MEQ PO PACK
40.0000 meq | PACK | Freq: Once | ORAL | Status: DC
  Filled 2020-12-09: qty 2

## 2020-12-09 NOTE — Progress Notes (Signed)
PROGRESS NOTE    Jamie Wilson  WUJ:[id number] DOB: 1956/06/25 DOA: 11/02/2020 PCP: Patient, [name]     Brief Narrative:  Jamie Wilson is a 65 y.o.malewith medical history significant ofbladder CA w/ chronic foley placement; bowel obstruction s/p partial bowel resection w/ colostomy. Patient presented secondary to abdominal pain and found to have evidence of sepsis possibly secondary to necrotic bladder mass. Empiric antibiotics initiated. Urology and oncology consulted and recommendations given. Hospital course complicated by hypercalcemia. Bone scan does not show any lytic or blastic bone lesions suspicious for bone mets.AKI resolved.  Still with some degree of hypercalcemia.  He also had some GI symptoms including nausea, emesis and abdominal distention.  KUB on 1/19 raise concern for SBO but GI symptoms resolved the next day.  Hospitalization further complicated by development of ESBL bacteremia.  He was started on IV Merrem and has completed 10-day course.  New events last 24 hours / Subjective: No new complaints voiced.  Continues to have poor intake, but states that he tried to eat more yesterday.  Assessment & Plan:   Principal Problem:   ESBL (extended spectrum beta-lactamase) producing bacteria infection Active Problems:   Malnutrition (HCC)   Sepsis ([place])   Protein-calorie malnutrition, severe   Lower urinary tract infectious disease   Palliative care by specialist   Goals of care, counseling/discussion   Sepsis secondary to E. 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 coli bacteremia.  Completed 10-day course of IV Merrem. 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 Jeffie Pollock -Follow-up with oncology in 2 weeks, Dr. Irene Limbo   Hypercalcemia of malignancy -Status post zoledronic acid x 2  -Patient underwent bone survey during this hospitalization which was negative for metastatic disease -Initially improved, but then trending upward again -Continue lasix, IVF  -Trend BMP   AKI -Resolved  Severe protein calorie malnutrition -Estimated body mass index is 15.76 kg/m as calculated from the following:   Height as of this encounter: 6\' 1"  (1.854 m).   Weight as of this encounter: 54.2 kg. -Failure to thrive and poor PO intake -Palliative care consulted for goals of care discussion.    Hypokalemia -Replace, trend    DVT prophylaxis:  SCDs Start: 11/03/20 2329  Code Status: Full code Family Communication: No family at bedside Disposition Plan:  Status is: Inpatient  Remains inpatient appropriate because:Inpatient level of care appropriate due to severity of illness   Dispo: The patient is from: Home              Anticipated d/c is to: SNF              Anticipated d/c date is: 2 days              Patient currently is not medically stable to d/c.    Difficult to place patient No   Consultants:   Urology  Oncology  Palliative care medicine   Antimicrobials:  Anti-infectives (From admission, onward)   Start     Dose/Rate Route Frequency Ordered Stop   11/23/20 1000  meropenem (MERREM) 1 g in sodium chloride 0.9 % 100 mL IVPB        1 g 200 mL/hr  over 30 Minutes Intravenous Every 8 hours 11/23/20 0937 12/03/20 1857   11/12/20 2000  ciprofloxacin (CIPRO) tablet 500 mg        500 mg Oral 2 times daily 11/12/20 1543 11/17/20 2359   11/10/20 0800  ciprofloxacin (CIPRO) tablet 500 mg  Status:  Discontinued        500 mg Oral Daily with breakfast 11/09/20 1357 11/12/20 1543   11/06/20 1200  ciprofloxacin (CIPRO) tablet 500 mg  Status:  Discontinued        500 mg Oral 2  times daily 11/06/20 1114 11/09/20 1357   11/05/20 1500  cefdinir (OMNICEF) capsule 300 mg  Status:  Discontinued        300 mg Oral Every 12 hours 11/05/20 1337 11/06/20 1114   11/03/20 2200  vancomycin (VANCOREADY) IVPB 500 mg/100 mL  Status:  Discontinued        500 mg 100 mL/hr over 60 Minutes Intravenous Every 12 hours 11/03/20 1402 11/05/20 1335   11/03/20 1415  piperacillin-tazobactam (ZOSYN) IVPB 3.375 g  Status:  Discontinued        3.375 g 12.5 mL/hr over 240 Minutes Intravenous Every 8 hours 11/03/20 1401 11/05/20 1337   11/03/20 0645  vancomycin (VANCOCIN) IVPB 1000 mg/200 mL premix        1,000 mg 200 mL/hr over 60 Minutes Intravenous  Once 11/03/20 0631 11/03/20 1147   11/03/20 0600  piperacillin-tazobactam (ZOSYN) IVPB 4.5 g  Status:  Discontinued        4.5 g 200 mL/hr over 30 Minutes Intravenous  Once 11/03/20 0546 11/03/20 0547   11/03/20 0600  piperacillin-tazobactam (ZOSYN) IVPB 3.375 g        3.375 g 100 mL/hr over 30 Minutes Intravenous To Emergency Dept 11/03/20 0548 11/03/20 0650       Objective: Vitals:   12/08/20 0606 12/08/20 1423 12/08/20 2032 12/09/20 0617  BP: 118/66 99/67 97/66  102/68  Pulse: 61 100 99 (!) 110  Resp: 17 17 18 18   Temp: 99 F (37.2 C) 98.3 F (36.8 C) 97.8 F (36.6 C) 98.3 F (36.8 C)  TempSrc: Oral  Oral Oral  SpO2: 96% 98% 97% 94%  Weight:      Height:        Intake/Output Summary (Last 24 hours) at 12/09/2020 1109 Last data filed at 12/09/2020 7035 Gross per 24 hour  Intake 1600 ml  Output 2050 ml  Net -450 ml   Filed Weights   11/23/20 1017 11/24/20 0508 12/05/20 0644  Weight: 59.9 kg 58.1 kg 54.2 kg     Examination: General exam: Appears calm and comfortable, chronically ill-appearing Respiratory system: Clear to auscultation. Respiratory effort normal. Cardiovascular system: S1 & S2 heard, RRR. No pedal edema. Gastrointestinal system: Abdomen is nondistended, soft and nontender. Normal bowel sounds  heard. Central nervous system: Alert and oriented. Non focal exam. Speech clear  Extremities: Symmetric in appearance bilaterally  Skin: No rashes, lesions or ulcers on exposed skin  Psychiatry: Judgement and insight appear stable. Mood & affect appropriate.     Data Reviewed: I have personally reviewed following labs and imaging studies  CBC: Recent Labs  Lab 12/03/20 0330 12/04/20 0093 12/05/20 0320 12/06/20 0513 12/07/20 0418 12/08/20 0323 12/09/20 0524  WBC 20.1* 19.1* 17.3* 19.9* 16.3* 16.4* 18.7*  NEUTROABS 10.9* 9.8* 9.3*  --  8.0*  --   --   HGB 8.9* 8.4* 8.5* 8.3* 7.3* 7.2* 7.5*  HCT 28.6* 26.9* 27.5* 26.8* 24.0* 23.6* 24.6*  MCV 88.3 87.6  89.3 87.9 89.2 89.1 89.5  PLT 394 399 382 386 328 324 537   Basic Metabolic Panel: Recent Labs  Lab 12/03/20 0330 12/04/20 0623 12/05/20 0320 12/06/20 0513 12/07/20 0418 12/08/20 0323 12/09/20 0524  NA 132* 135 136 134* 138 137 135  K 3.2* 3.8 3.3* 3.3* 3.5 2.8* 3.3*  CL 105 107 106 104 106 106 107  CO2 21* 22 22 22 24 22 23   GLUCOSE 96 97 86 95 87 91 98  BUN 12 12 12 12 12 12 13   CREATININE 0.89 0.98 0.83 0.96 1.03 1.11 1.02  CALCIUM 11.6* 12.0* 11.8* 12.5* 12.9* 12.1* 12.1*  MG 1.8 2.1 1.8 2.3 1.9  --  1.7  PHOS 2.4*  --  2.7 2.6 2.3*  --   --    GFR: Estimated Creatinine Clearance: 55.4 mL/min (by C-G formula based on SCr of 1.02 mg/dL). Liver Function Tests: Recent Labs  Lab 12/03/20 0330 12/05/20 0320 12/06/20 0513 12/07/20 0418  ALBUMIN 2.0* 2.0* 2.0* 3.2*   No results for input(s): LIPASE, AMYLASE in the last 168 hours. No results for input(s): AMMONIA in the last 168 hours. Coagulation Profile: No results for input(s): INR, PROTIME in the last 168 hours. Cardiac Enzymes: No results for input(s): CKTOTAL, CKMB, CKMBINDEX, TROPONINI in the last 168 hours. BNP (last 3 results) No results for input(s): PROBNP in the last 8760 hours. HbA1C: No results for input(s): HGBA1C in the last 72  hours. CBG: Recent Labs  Lab 12/05/20 0632 12/06/20 0453 12/07/20 0704 12/08/20 0633 12/09/20 0620  GLUCAP 87 88 89 94 97   Lipid Profile: No results for input(s): CHOL, HDL, LDLCALC, TRIG, CHOLHDL, LDLDIRECT in the last 72 hours. Thyroid Function Tests: No results for input(s): TSH, T4TOTAL, FREET4, T3FREE, THYROIDAB in the last 72 hours. Anemia Panel: No results for input(s): VITAMINB12, FOLATE, FERRITIN, TIBC, IRON, RETICCTPCT in the last 72 hours. Sepsis Labs: No results for input(s): PROCALCITON, LATICACIDVEN in the last 168 hours.  No results found for this or any previous visit (from the past 240 hour(s)).    Radiology Studies: No results found.    Scheduled Meds: . (feeding supplement) PROSource Plus  30 mL Oral Daily  . B-complex with vitamin C  1 tablet Oral Daily  . Chlorhexidine Gluconate Cloth  6 each Topical Daily  . dronabinol  2.5 mg Oral BID AC  . feeding supplement  1 Container Oral TID BM  . feeding supplement  237 mL Oral BID BM  . folic acid  1 mg Oral Daily  . furosemide  40 mg Intravenous Daily  . hydrocerin   Topical BID  . nicotine  14 mg Transdermal Daily  . polyethylene glycol  17 g Oral Daily  . potassium chloride  40 mEq Oral Once  . senna-docusate  1 tablet Oral QHS  . tamsulosin  0.4 mg Oral Nightly  . cyanocobalamin  1,000 mcg Oral Daily   Continuous Infusions: . 0.9 % NaCl with KCl 40 mEq / L 75 mL/hr at 12/08/20 1038     LOS: 36 days      Time spent: 25 minutes   Dessa Phi, DO [facility] 12/09/2020, 11:09 AM   Available via Epic secure chat 7am-7pm After these hours, please refer to coverage provider listed on [email or web address]

## 2020-12-10 DIAGNOSIS — Z1612 Extended spectrum beta lactamase (ESBL) resistance: Secondary | ICD-10-CM | POA: Diagnosis not present

## 2020-12-10 DIAGNOSIS — A499 Bacterial infection, unspecified: Secondary | ICD-10-CM | POA: Diagnosis not present

## 2020-12-10 LAB — BASIC METABOLIC PANEL
Anion gap: 8 (ref 5–15)
BUN: 15 mg/dL (ref 8–23)
CO2: 21 mmol/L — ABNORMAL LOW (ref 22–32)
Calcium: 12.2 mg/dL — ABNORMAL HIGH (ref 8.9–10.3)
Chloride: 109 mmol/L (ref 98–111)
Creatinine, Ser: 1.12 mg/dL (ref 0.61–1.24)
GFR, Estimated: >60 mL/min (ref >60)
Glucose, Bld: 90 mg/dL (ref 70–99)
Potassium: 4 mmol/L (ref 3.5–5.1)
Sodium: 138 mmol/L (ref 135–145)

## 2020-12-10 LAB — CBC
HCT: 27.3 % — ABNORMAL LOW (ref 39.0–52.0)
Hemoglobin: 8 g/dL — ABNORMAL LOW (ref 13.0–17.0)
MCH: 26.9 pg (ref 26.0–34.0)
MCHC: 29.3 g/dL — ABNORMAL LOW (ref 30.0–36.0)
MCV: 91.9 fL (ref 80.0–100.0)
Platelets: 372 10*3/uL (ref 150–400)
RBC: 2.97 MIL/uL — ABNORMAL LOW (ref 4.22–5.81)
RDW: 15.7 % — ABNORMAL HIGH (ref 11.5–15.5)
WBC: 18 10*3/uL — ABNORMAL HIGH (ref 4.0–10.5)
nRBC: 0 % (ref 0.0–0.2)

## 2020-12-10 LAB — MAGNESIUM: Magnesium: 1.7 mg/dL (ref 1.7–2.4)

## 2020-12-10 LAB — GLUCOSE, CAPILLARY: Glucose-Capillary: 92 mg/dL (ref 70–99)

## 2020-12-10 MED ORDER — POTASSIUM CHLORIDE IN NACL 20-0.9 MEQ/L-% IV SOLN
INTRAVENOUS | Status: DC
  Filled 2020-12-10 (×4): qty 1000

## 2020-12-10 NOTE — Progress Notes (Signed)
   12/10/20 1413  Assess: MEWS Score  Temp 98.1 F (36.7 C)  BP 97/70  Pulse Rate (!) 117  Resp 17  SpO2 95 %  O2 Device Room Air  Assess: MEWS Score  MEWS Temp 0  MEWS Systolic 1  MEWS Pulse 2  MEWS RR 0  MEWS LOC 0  MEWS Score 3  MEWS Score Color Yellow  Assess: if the MEWS score is Yellow or Red  Were vital signs taken at a resting state? Yes  Focused Assessment No change from prior assessment  Early Detection of Sepsis Score *See Row Information* Low  MEWS guidelines implemented *See Row Information* No, previously yellow, continue vital signs every 4 hours  Take Vital Signs  Increase Vital Sign Frequency  Yellow: Q 2hr X 2 then Q 4hr X 2, if remains yellow, continue Q 4hrs  Notify: Charge Nurse/RN  Name of Charge Nurse/RN Notified Alphonzo Lemmings  Date Charge Nurse/RN Notified 12/10/20  Time Charge Nurse/RN Notified 1414  Document  Patient Outcome Other (Comment) (pt stable)  Progress note created (see row info) Yes

## 2020-12-10 NOTE — Progress Notes (Signed)
Daily Progress Note   Patient Name: Jamie Wilson       Date: 12/10/2020 DOB: Nov 16, 1955  Age: 65 y.o. MRN#: [id number] Attending Physician: Dessa Phi, DO Primary Care Physician: Patient, [name] Admit Date: 11/02/2020  Reason for Consultation/Follow-up: Establishing goals of care  Subjective: I met today with Jamie Wilson.  He was lying in bed in no distress.  We discussed clinical course as well as wishes moving forward in regard to care plan and the continued decline he has had in his nutrition, cognition, and functional status.  We discussed difference between a aggressive medical intervention path and a palliative, comfort focused care path.  Values and goals of care important to patient and family were attempted to be elicited.  Overall, Jamie Wilson continues to return conversation to day-to-day issues he encounters in the hospital (not getting pain medication as quickly as he wishes he would, not getting the correct flavor of boost supplements, etc.).    He tells me that he understands, " some things may just not be able to happen."  We discussed limitations of care and considering what options may fit him best if he continues to have poor nutrition moving forward.  He does tell me that he understands concern that his poor nutrition and functional status may preclude him from having surgery that had been discussed with urology or pursuing further disease modifying therapy with oncology.  At this point, however, he reports continuing to be invested in plan to try to get out of the hospital to rehab to see if he can improve his functional status.  Concept of Hospice and Palliative Care were discussed.  He reports being open to consideration for hospice as well as consideration for changing CODE  STATUS to DNR/DNI.  At the same time, I do not think he realizes just how sick he is as he states that he is willing to consider these " later on when things get worse."  Questions and concerns addressed.   PMT will continue to support holistically.  Length of Stay: 37  Current Medications: Scheduled Meds:  . (feeding supplement) PROSource Plus  30 mL Oral Daily  . B-complex with vitamin C  1 tablet Oral Daily  . Chlorhexidine Gluconate Cloth  6 each Topical Daily  . dronabinol  2.5 mg Oral BID  AC  . feeding supplement  1 Container Oral TID BM  . feeding supplement  237 mL Oral BID BM  . folic acid  1 mg Oral Daily  . furosemide  40 mg Intravenous Daily  . hydrocerin   Topical BID  . nicotine  14 mg Transdermal Daily  . polyethylene glycol  17 g Oral Daily  . potassium chloride  40 mEq Oral Once  . senna-docusate  1 tablet Oral QHS  . tamsulosin  0.4 mg Oral Nightly  . cyanocobalamin  1,000 mcg Oral Daily    Continuous Infusions: . 0.9 % NaCl with KCl 40 mEq / L 75 mL/hr at 12/09/20 1800    PRN Meds: acetaminophen (TYLENOL) oral liquid 160 mg/5 mL, hydrOXYzine, ondansetron **OR** ondansetron (ZOFRAN) IV, oxyCODONE, sodium chloride flush, sodium chloride flush  Physical Exam         General: Alert, awake, in no acute distress.  HEENT: No bruits, no goiter, no JVD Heart: Regular rate and rhythm. No murmur appreciated. Lungs: Good air movement, clear Abdomen: Soft, nontender, nondistended, positive bowel sounds.  Ext: No significant edema Skin: Warm and dry Neuro: Grossly intact, nonfocal.   Vital Signs: BP 110/69 (BP Location: Left Arm)   Pulse (!) 110   Temp 98.6 F (37 C) (Oral)   Resp 17   Ht $R'6\' 1"'AM$  (1.854 m)   Wt 54.2 kg   SpO2 96%   BMI 15.76 kg/m  SpO2: SpO2: 96 % O2 Device: O2 Device: Room Air O2 Flow Rate: O2 Flow Rate (L/min): 2 L/min  Intake/output summary:   Intake/Output Summary (Last 24 hours) at 12/10/2020 0835 Last data filed at 12/10/2020  0600 Gross per 24 hour  Intake 1485 ml  Output 2250 ml  Net -765 ml   LBM: Last BM Date: 12/09/20 Baseline Weight: Weight: 50.7 kg Most recent weight: Weight: 54.2 kg       Palliative Assessment/Data:    Flowsheet Rows   Flowsheet Row Most Recent Value  Intake Tab   Referral Department Hospitalist  Unit at Time of Referral Med/Surg Unit  Palliative Care Primary Diagnosis Cancer  Date Notified 11/03/20  Palliative Care Type New Palliative care  Reason for referral Clarify Goals of Care  Date of Admission 11/02/20  Date first seen by Palliative Care 11/05/20  # of days Palliative referral response time 2 Day(s)  # of days IP prior to Palliative referral 1  Clinical Assessment   Palliative Performance Scale Score 40%  Psychosocial & Spiritual Assessment   Palliative Care Outcomes   Patient/Family meeting held? Yes  Who was at the meeting? patient  Palliative Care Outcomes Clarified goals of care, Provided psychosocial or spiritual support, Linked to palliative care logitudinal support, ACP counseling assistance      Patient Active Problem List   Diagnosis Date Noted  . ESBL (extended spectrum beta-lactamase) producing bacteria infection 11/27/2020  . Palliative care by specialist   . Goals of care, counseling/discussion   . Malignant neoplasm of urinary bladder ([place])   . Lower urinary tract infectious disease 11/03/2020  . Protein-calorie malnutrition, severe 10/12/2020  . Sepsis ([place]) 10/10/2020  . Acute lower UTI 10/02/2020  . 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 Malnutrition ([place]) 10/02/2020  . Acute urinary retention 08/28/2020  . Small bowel obstruction ([place]) 08/16/2020  . Hypercalcemia 08/16/2020  . AKI (acute kidney injury) ([place]) 08/16/2020  . Right nephrolithiasis 08/16/2020    Palliative Care Assessment & Plan   Patient Profile: 65 y.o. 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 Found to have sepsis secondary to necrotic bladder mass. Antibiotics initiated. Urology and oncology following. CT a/p reveals bladder mass with progression from previous scan. Followed by Dr. Irene Limbo. Patient is a poor candidate for chemotherapy with poor functional status, poor nutritional status, lack of social support, and recurrent UTI's with sepsis. Per urology, may benefit from repeat TURBT to debulk infected necrotic bladder mass prior to ? Chemotherapy and radical cystectomy. Palliative medicine consultation for goals of care.    Recommendations/Plan: Full code/full scope Jamie Wilson continues to have poor nutrition and functional status.  Attempted to discuss with him today how this relates to his overall prognosis and care plan with advanced bladder cancer.  He is open to conversation about things such as hospice and CODE STATUS, however, I do not think that he really has insight into just how sick he is.  He states that these are all things he would be agreeable to, "later on when things get worse."  I discussed with him again my concern about his nutrition.  He tells me today he thinks this is improving over the last couple of days.   I told him I would plan to follow-up with him in a couple of days to continue conversation and reassess his overall nutrition, cognition, and functional status is these will likely be driving indicators of how he is doing overall to help guide plan moving forward. At this point, he expressed he is not interested in hospice services.  We will continue conversation based upon clinical course and nutritional intake.  Goals of Care and Additional Recommendations: Limitations on Scope of Treatment: Full Scope Treatment  Code Status:    Code Status Orders  (From admission, onward)         Start     Ordered   11/03/20 2329  Full code  Continuous        11/03/20 2328         Code Status History    Date Active Date Inactive Code Status Order ID Comments User Context   10/02/2020 0959 10/14/2020 0216 Full Code [id number]  Sueanne Margarita, DO Inpatient   08/17/2020 0002 08/29/2020 0357 Full Code [id number]  Lenore Cordia, MD ED   Advance Care Planning Activity      Prognosis:  Guarded  Discharge Planning: To Be Determined-he continues to endorse being hopeful to transition to rehab in [place] for continued attempts at regaining functional status  Care plan was discussed with patient  Thank you for allowing the Palliative Medicine Team to assist in the care of this patient.   Time In: 1400 Time Out: 1450 Total Time 50 Prolonged Time Billed No      Greater than 50%  of this time was spent counseling and coordinating care related to the above assessment and plan.  Micheline Rough, MD  Please contact Palliative Medicine Team phone at [phone] for questions and concerns.

## 2020-12-10 NOTE — Progress Notes (Signed)
PROGRESS NOTE    Jamie Wilson  NLZ:[id number] DOB: 01/29/1956 DOA: 11/02/2020 PCP: Patient, [name]     Brief Narrative:  Jamie Wilson is a 65 y.o.malewith medical history significant ofbladder CA w/ chronic foley placement; bowel obstruction s/p partial bowel resection w/ colostomy. Patient presented secondary to abdominal pain and found to have evidence of sepsis possibly secondary to necrotic bladder mass. Empiric antibiotics initiated. Urology and oncology consulted and recommendations given. Hospital course complicated by hypercalcemia. Bone scan does not show any lytic or blastic bone lesions suspicious for bone mets.AKI resolved.  Still with some degree of hypercalcemia.  He also had some GI symptoms including nausea, emesis and abdominal distention.  KUB on 1/19 raise concern for SBO but GI symptoms resolved the next day.  Hospitalization further complicated by development of ESBL bacteremia.  He was started on IV Merrem and has completed 10-day course.  New events last 24 hours / Subjective: States that he is in pain.  He has a heating pad across his bladder.  He has a carton of Lactaid at bedside.  I discussed with him importance of limiting his calcium intake and limiting his dairy products as we are dealing with hypercalcemia  Assessment & Plan:   Principal Problem:   ESBL (extended spectrum beta-lactamase) producing bacteria infection Active Problems:   Malnutrition ([place])   Sepsis ([place])   Protein-calorie malnutrition, severe   Lower urinary tract infectious disease   Palliative care by specialist   Goals of care, counseling/discussion   Sepsis secondary to E. 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 coli bacteremia.  Completed 10-day course of IV Merrem. -Repeat  blood cultures 1/28 negative to date   Urothelial carcinoma of the bladder with chronic indwelling Foley catheter -Foley catheter exchanged 1/25 -Follow-up with urology patient for radical cystectomy, Dr. Jeffie Pollock -Follow-up with oncology in 2 weeks, Dr. Irene Limbo   Hypercalcemia of malignancy -Status post zoledronic acid x 2  -Patient underwent bone survey during this hospitalization which was negative for metastatic disease -Initially improved, but then trended upward again -Continue lasix, IVF  -Trend BMP   AKI -Resolved  Severe protein calorie malnutrition -Estimated body mass index is 15.76 kg/m as calculated from the following:   Height as of this encounter: 6\' 1"  (1.854 m).   Weight as of this encounter: 54.2 kg. -Failure to thrive and poor PO intake  Goals of care -Palliative care consulted for goals of care discussion.  Patient desiring full scope of care at this time.  Has poor insight into his medical condition   DVT prophylaxis:  SCDs Start: 11/03/20 2329  Code Status: Full code Family Communication: No family at bedside Disposition Plan:  Status is: Inpatient  Remains inpatient appropriate because:Inpatient level of care appropriate due to severity of illness   Dispo: The patient is from: Home              Anticipated d/c is to: SNF              Anticipated d/c date is: 2 days              Patient currently is not medically stable to d/c.    Difficult to place patient No   Consultants:   Urology  Oncology  Palliative care medicine   Antimicrobials:  Anti-infectives (From  admission, onward)   Start     Dose/Rate Route Frequency Ordered Stop   11/23/20 1000  meropenem (MERREM) 1 g in sodium chloride 0.9 % 100 mL IVPB        1 g 200 mL/hr over 30 Minutes Intravenous Every 8 hours 11/23/20 0937 12/03/20 1857   11/12/20 2000  ciprofloxacin (CIPRO) tablet 500 mg        500 mg Oral 2 times daily 11/12/20 1543 11/17/20 2359   11/10/20 0800  ciprofloxacin  (CIPRO) tablet 500 mg  Status:  Discontinued        500 mg Oral Daily with breakfast 11/09/20 1357 11/12/20 1543   11/06/20 1200  ciprofloxacin (CIPRO) tablet 500 mg  Status:  Discontinued        500 mg Oral 2 times daily 11/06/20 1114 11/09/20 1357   11/05/20 1500  cefdinir (OMNICEF) capsule 300 mg  Status:  Discontinued        300 mg Oral Every 12 hours 11/05/20 1337 11/06/20 1114   11/03/20 2200  vancomycin (VANCOREADY) IVPB 500 mg/100 mL  Status:  Discontinued        500 mg 100 mL/hr over 60 Minutes Intravenous Every 12 hours 11/03/20 1402 11/05/20 1335   11/03/20 1415  piperacillin-tazobactam (ZOSYN) IVPB 3.375 g  Status:  Discontinued        3.375 g 12.5 mL/hr over 240 Minutes Intravenous Every 8 hours 11/03/20 1401 11/05/20 1337   11/03/20 0645  vancomycin (VANCOCIN) IVPB 1000 mg/200 mL premix        1,000 mg 200 mL/hr over 60 Minutes Intravenous  Once 11/03/20 0631 11/03/20 1147   11/03/20 0600  piperacillin-tazobactam (ZOSYN) IVPB 4.5 g  Status:  Discontinued        4.5 g 200 mL/hr over 30 Minutes Intravenous  Once 11/03/20 0546 11/03/20 0547   11/03/20 0600  piperacillin-tazobactam (ZOSYN) IVPB 3.375 g        3.375 g 100 mL/hr over 30 Minutes Intravenous To Emergency Dept 11/03/20 0548 11/03/20 0650       Objective: Vitals:   12/09/20 0617 12/09/20 1331 12/09/20 2156 12/10/20 0524  BP: 102/68 93/60 111/70 110/69  Pulse: (!) 110 100 (!) 110 (!) 110  Resp: 18 17 17 17   Temp: 98.3 F (36.8 C) 98.4 F (36.9 C) 98.1 F (36.7 C) 98.6 F (37 C)  TempSrc: Oral Oral Oral Oral  SpO2: 94%  96% 96%  Weight:      Height:        Intake/Output Summary (Last 24 hours) at 12/10/2020 1005 Last data filed at 12/10/2020 0600 Gross per 24 hour  Intake 1140 ml  Output 2250 ml  Net -1110 ml   Filed Weights   11/23/20 1017 11/24/20 0508 12/05/20 0644  Weight: 59.9 kg 58.1 kg 54.2 kg     Examination: General exam: Appears calm and comfortable, chronically  ill-appearing Respiratory system: Clear to auscultation. Respiratory effort normal. Cardiovascular system: S1 & S2 heard, RRR. No pedal edema. Gastrointestinal system: Abdomen is nondistended, soft and nontender. Normal bowel sounds heard. Central nervous system: Alert and oriented. Non focal exam. Speech clear  Extremities: Symmetric in appearance bilaterally  Skin: No rashes, lesions or ulcers on exposed skin  Psychiatry: Judgement and insight appear poor  Data Reviewed: I have personally reviewed following labs and imaging studies  CBC: Recent Labs  Lab 12/04/20 0623 12/05/20 0320 12/06/20 0513 12/07/20 0418 12/08/20 0323 12/09/20 0524 12/10/20 0120  WBC 19.1* 17.3* 19.9* 16.3* 16.4* 18.7* 18.0*  NEUTROABS 9.8* 9.3*  --  8.0*  --   --   --   HGB 8.4* 8.5* 8.3* 7.3* 7.2* 7.5* 8.0*  HCT 26.9* 27.5* 26.8* 24.0* 23.6* 24.6* 27.3*  MCV 87.6 89.3 87.9 89.2 89.1 89.5 91.9  PLT 399 382 386 328 324 353 564   Basic Metabolic Panel: Recent Labs  Lab 12/05/20 0320 12/06/20 0513 12/07/20 0418 12/08/20 0323 12/09/20 0524 12/10/20 0120  NA 136 134* 138 137 135 138  K 3.3* 3.3* 3.5 2.8* 3.3* 4.0  CL 106 104 106 106 107 109  CO2 22 22 24 22 23  21*  GLUCOSE 86 95 87 91 98 90  BUN 12 12 12 12 13 15   CREATININE 0.83 0.96 1.03 1.11 1.02 1.12  CALCIUM 11.8* 12.5* 12.9* 12.1* 12.1* 12.2*  MG 1.8 2.3 1.9  --  1.7 1.7  PHOS 2.7 2.6 2.3*  --   --   --    GFR: Estimated Creatinine Clearance: 50.4 mL/min (by C-G formula based on SCr of 1.12 mg/dL). Liver Function Tests: Recent Labs  Lab 12/05/20 0320 12/06/20 0513 12/07/20 0418  ALBUMIN 2.0* 2.0* 3.2*   No results for input(s): LIPASE, AMYLASE in the last 168 hours. No results for input(s): AMMONIA in the last 168 hours. Coagulation Profile: No results for input(s): INR, PROTIME in the last 168 hours. Cardiac Enzymes: No results for input(s): CKTOTAL, CKMB, CKMBINDEX, TROPONINI in the last 168 hours. BNP (last 3 results) No  results for input(s): PROBNP in the last 8760 hours. HbA1C: No results for input(s): HGBA1C in the last 72 hours. CBG: Recent Labs  Lab 12/06/20 0453 12/07/20 0704 12/08/20 0633 12/09/20 0620 12/10/20 0520  GLUCAP 88 89 94 97 92   Lipid Profile: No results for input(s): CHOL, HDL, LDLCALC, TRIG, CHOLHDL, LDLDIRECT in the last 72 hours. Thyroid Function Tests: No results for input(s): TSH, T4TOTAL, FREET4, T3FREE, THYROIDAB in the last 72 hours. Anemia Panel: No results for input(s): VITAMINB12, FOLATE, FERRITIN, TIBC, IRON, RETICCTPCT in the last 72 hours. Sepsis Labs: No results for input(s): PROCALCITON, LATICACIDVEN in the last 168 hours.  No results found for this or any previous visit (from the past 240 hour(s)).    Radiology Studies: No results found.    Scheduled Meds: . (feeding supplement) PROSource Plus  30 mL Oral Daily  . B-complex with vitamin C  1 tablet Oral Daily  . Chlorhexidine Gluconate Cloth  6 each Topical Daily  . dronabinol  2.5 mg Oral BID AC  . feeding supplement  1 Container Oral TID BM  . feeding supplement  237 mL Oral BID BM  . folic acid  1 mg Oral Daily  . furosemide  40 mg Intravenous Daily  . hydrocerin   Topical BID  . nicotine  14 mg Transdermal Daily  . polyethylene glycol  17 g Oral Daily  . potassium chloride  40 mEq Oral Once  . senna-docusate  1 tablet Oral QHS  . tamsulosin  0.4 mg Oral Nightly  . cyanocobalamin  1,000 mcg Oral Daily   Continuous Infusions: . 0.9 % NaCl with KCl 40 mEq / L 75 mL/hr at 12/09/20 1800     LOS: 37 days      Time spent: 25 minutes   Dessa Phi, DO [facility] 12/10/2020, 10:05 AM   Available via Epic secure chat 7am-7pm After these hours, please refer to coverage provider listed on [email or web address]

## 2020-12-11 DIAGNOSIS — A499 Bacterial infection, unspecified: Secondary | ICD-10-CM | POA: Diagnosis not present

## 2020-12-11 DIAGNOSIS — Z1612 Extended spectrum beta lactamase (ESBL) resistance: Secondary | ICD-10-CM | POA: Diagnosis not present

## 2020-12-11 LAB — CBC
HCT: 25.6 % — ABNORMAL LOW (ref 39.0–52.0)
Hemoglobin: 7.7 g/dL — ABNORMAL LOW (ref 13.0–17.0)
MCH: 27.2 pg (ref 26.0–34.0)
MCHC: 30.1 g/dL (ref 30.0–36.0)
MCV: 90.5 fL (ref 80.0–100.0)
Platelets: 373 10*3/uL (ref 150–400)
RBC: 2.83 MIL/uL — ABNORMAL LOW (ref 4.22–5.81)
RDW: 15.5 % (ref 11.5–15.5)
WBC: 17.7 10*3/uL — ABNORMAL HIGH (ref 4.0–10.5)
nRBC: 0 % (ref 0.0–0.2)

## 2020-12-11 LAB — MAGNESIUM: Magnesium: 1.5 mg/dL — ABNORMAL LOW (ref 1.7–2.4)

## 2020-12-11 LAB — BASIC METABOLIC PANEL
Anion gap: 7 (ref 5–15)
BUN: 14 mg/dL (ref 8–23)
CO2: 22 mmol/L (ref 22–32)
Calcium: 12.3 mg/dL — ABNORMAL HIGH (ref 8.9–10.3)
Chloride: 109 mmol/L (ref 98–111)
Creatinine, Ser: 1.14 mg/dL (ref 0.61–1.24)
GFR, Estimated: >60 mL/min (ref >60)
Glucose, Bld: 100 mg/dL — ABNORMAL HIGH (ref 70–99)
Potassium: 3.9 mmol/L (ref 3.5–5.1)
Sodium: 138 mmol/L (ref 135–145)

## 2020-12-11 LAB — GLUCOSE, CAPILLARY: Glucose-Capillary: 90 mg/dL (ref 70–99)

## 2020-12-11 MED ORDER — MAGNESIUM SULFATE 2 GM/50ML IV SOLN
2.0000 g | Freq: Once | INTRAVENOUS | Status: AC
  Administered 2020-12-11: 2 g via INTRAVENOUS
  Filled 2020-12-11: qty 50

## 2020-12-11 MED ORDER — POLYETHYLENE GLYCOL 3350 17 G PO PACK
17.0000 g | PACK | Freq: Two times a day (BID) | ORAL | Status: DC
  Administered 2020-12-11 – 2020-12-23 (×14): 17 g via ORAL
  Filled 2020-12-11 (×21): qty 1

## 2020-12-11 NOTE — Progress Notes (Signed)
PROGRESS NOTE    Jamie Wilson  UJW:[id number] DOB: May 22, 1956 DOA: 11/02/2020 PCP: Patient, [name]     Brief Narrative:  Jamie Wilson is a 65 y.o.malewith medical history significant ofbladder CA w/ chronic foley placement; bowel obstruction s/p partial bowel resection w/ colostomy. Patient presented secondary to abdominal pain and found to have evidence of sepsis possibly secondary to necrotic bladder mass. Empiric antibiotics initiated. Urology and oncology consulted and recommendations given. Hospital course complicated by hypercalcemia. Bone scan does not show any lytic or blastic bone lesions suspicious for bone mets.AKI resolved.  Still with some degree of hypercalcemia.  He also had some GI symptoms including nausea, emesis and abdominal distention.  KUB on 1/19 raise concern for SBO but GI symptoms resolved the next day.  Hospitalization further complicated by development of ESBL bacteremia.  He was started on IV Merrem and has completed 10-day course.  New events last 24 hours / Subjective: States that he is feeling sick.  Various complaints regarding cafeteria food.  Discussed with him importance of keeping up calories and nutrition if he wants to get better, stronger, be discharged.  Assessment & Plan:   Principal Problem:   ESBL (extended spectrum beta-lactamase) producing bacteria infection Active Problems:   Malnutrition (HCC)   Sepsis ([place])   Protein-calorie malnutrition, severe   Lower urinary tract infectious disease   Palliative care by specialist   Goals of care, counseling/discussion   Sepsis secondary to E. 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 coli bacteremia.  Completed 10-day course of IV Merrem. 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 Jeffie Pollock -Follow-up with oncology in 2 weeks, Dr. Irene Limbo   Hypercalcemia of malignancy -Status post zoledronic acid x 2  -Patient underwent bone survey during this hospitalization which was negative for metastatic disease -Initially improved, but then trended upward again -Continue lasix, IVF  -Trend BMP   AKI -Resolved  Severe protein calorie malnutrition -Estimated body mass index is 15.76 kg/m as calculated from the following:   Height as of this encounter: 6\' 1"  (1.854 m).   Weight as of this encounter: 54.2 kg. -Failure to thrive and poor PO intake  Goals of care -Palliative care consulted for goals of care discussion.  Patient desiring full scope of care at this time.  Has poor insight into his medical condition  Hypomagnesemia -Replace, trend  DVT prophylaxis:  SCDs Start: 11/03/20 2329  Code Status: Full code Family Communication: No family at bedside Disposition Plan:  Status is: Inpatient  Remains inpatient appropriate because:Inpatient level of care appropriate due to severity of illness   Dispo: The patient is from: Home              Anticipated d/c is to: SNF              Anticipated d/c date is: 2 days              Patient currently is not medically stable to d/c.  Remains on IV Lasix, IV fluid with potassium supplementation   Difficult to place patient No   Consultants:   Urology  Oncology  Palliative care medicine   Antimicrobials:  Anti-infectives (From admission, onward)  Start     Dose/Rate Route Frequency Ordered Stop   11/23/20 1000  meropenem (MERREM) 1 g in sodium chloride 0.9 % 100 mL IVPB        1 g 200 mL/hr over 30 Minutes Intravenous Every 8 hours 11/23/20 0937 12/03/20 1857   11/12/20 2000  ciprofloxacin (CIPRO) tablet 500 mg        500 mg Oral 2 times daily  11/12/20 1543 11/17/20 2359   11/10/20 0800  ciprofloxacin (CIPRO) tablet 500 mg  Status:  Discontinued        500 mg Oral Daily with breakfast 11/09/20 1357 11/12/20 1543   11/06/20 1200  ciprofloxacin (CIPRO) tablet 500 mg  Status:  Discontinued        500 mg Oral 2 times daily 11/06/20 1114 11/09/20 1357   11/05/20 1500  cefdinir (OMNICEF) capsule 300 mg  Status:  Discontinued        300 mg Oral Every 12 hours 11/05/20 1337 11/06/20 1114   11/03/20 2200  vancomycin (VANCOREADY) IVPB 500 mg/100 mL  Status:  Discontinued        500 mg 100 mL/hr over 60 Minutes Intravenous Every 12 hours 11/03/20 1402 11/05/20 1335   11/03/20 1415  piperacillin-tazobactam (ZOSYN) IVPB 3.375 g  Status:  Discontinued        3.375 g 12.5 mL/hr over 240 Minutes Intravenous Every 8 hours 11/03/20 1401 11/05/20 1337   11/03/20 0645  vancomycin (VANCOCIN) IVPB 1000 mg/200 mL premix        1,000 mg 200 mL/hr over 60 Minutes Intravenous  Once 11/03/20 0631 11/03/20 1147   11/03/20 0600  piperacillin-tazobactam (ZOSYN) IVPB 4.5 g  Status:  Discontinued        4.5 g 200 mL/hr over 30 Minutes Intravenous  Once 11/03/20 0546 11/03/20 0547   11/03/20 0600  piperacillin-tazobactam (ZOSYN) IVPB 3.375 g        3.375 g 100 mL/hr over 30 Minutes Intravenous To Emergency Dept 11/03/20 0548 11/03/20 0650       Objective: Vitals:   12/10/20 1821 12/10/20 2214 12/11/20 0220 12/11/20 0542  BP: 106/62 114/69 116/67 104/63  Pulse: (!) 112 (!) 108 (!) 109 (!) 104  Resp: 17 18 18 18   Temp: 97.9 F (36.6 C) 98.3 F (36.8 C) 98.4 F (36.9 C) 98.3 F (36.8 C)  TempSrc: Oral Oral Oral Oral  SpO2: 96% 95% 96% 95%  Weight:      Height:        Intake/Output Summary (Last 24 hours) at 12/11/2020 1001 Last data filed at 12/11/2020 0600 Gross per 24 hour  Intake 2603.75 ml  Output 3100 ml  Net -496.25 ml   Filed Weights   11/23/20 1017 11/24/20 0508 12/05/20 0644  Weight: 59.9 kg 58.1 kg 54.2 kg      Examination: General exam: Appears calm and comfortable, chronically ill-appearing Respiratory system: Clear to auscultation. Respiratory effort normal. Cardiovascular system: S1 & S2 heard, RRR. No pedal edema. Gastrointestinal system: Abdomen is nondistended, soft  Central nervous system: Alert and oriented. Non focal exam. Speech clear  Extremities: Symmetric in appearance bilaterally  Skin: No rashes, lesions or ulcers on exposed skin  Psychiatry: Judgement and insight appear poor  Data Reviewed: I have personally reviewed following labs and imaging studies  CBC: Recent Labs  Lab 12/05/20 0320 12/06/20 0513 12/07/20 0418 12/08/20 0323 12/09/20 0524 12/10/20 0120 12/11/20 0430  WBC 17.3*   < > 16.3* 16.4* 18.7* 18.0* 17.7*  NEUTROABS 9.3*  --  8.0*  --   --   --   --   HGB 8.5*   < > 7.3* 7.2* 7.5* 8.0* 7.7*  HCT 27.5*   < > 24.0* 23.6* 24.6* 27.3* 25.6*  MCV 89.3   < > 89.2 89.1 89.5 91.9 90.5  PLT 382   < > 328 324 353 372 373   < > = values in this interval not displayed.   Basic Metabolic Panel: Recent Labs  Lab 12/05/20 0320 12/06/20 0513 12/07/20 0418 12/08/20 0323 12/09/20 0524 12/10/20 0120 12/11/20 0430  NA 136 134* 138 137 135 138 138  K 3.3* 3.3* 3.5 2.8* 3.3* 4.0 3.9  CL 106 104 106 106 107 109 109  CO2 22 22 24 22 23  21* 22  GLUCOSE 86 95 87 91 98 90 100*  BUN 12 12 12 12 13 15 14   CREATININE 0.83 0.96 1.03 1.11 1.02 1.12 1.14  CALCIUM 11.8* 12.5* 12.9* 12.1* 12.1* 12.2* 12.3*  MG 1.8 2.3 1.9  --  1.7 1.7 1.5*  PHOS 2.7 2.6 2.3*  --   --   --   --    GFR: Estimated Creatinine Clearance: 49.5 mL/min (by C-G formula based on SCr of 1.14 mg/dL). Liver Function Tests: Recent Labs  Lab 12/05/20 0320 12/06/20 0513 12/07/20 0418  ALBUMIN 2.0* 2.0* 3.2*   No results for input(s): LIPASE, AMYLASE in the last 168 hours. No results for input(s): AMMONIA in the last 168 hours. Coagulation Profile: No results for input(s): INR, PROTIME in the  last 168 hours. Cardiac Enzymes: No results for input(s): CKTOTAL, CKMB, CKMBINDEX, TROPONINI in the last 168 hours. BNP (last 3 results) No results for input(s): PROBNP in the last 8760 hours. HbA1C: No results for input(s): HGBA1C in the last 72 hours. CBG: Recent Labs  Lab 12/07/20 0704 12/08/20 0633 12/09/20 0620 12/10/20 0520 12/11/20 0500  GLUCAP 89 94 97 92 90   Lipid Profile: No results for input(s): CHOL, HDL, LDLCALC, TRIG, CHOLHDL, LDLDIRECT in the last 72 hours. Thyroid Function Tests: No results for input(s): TSH, T4TOTAL, FREET4, T3FREE, THYROIDAB in the last 72 hours. Anemia Panel: No results for input(s): VITAMINB12, FOLATE, FERRITIN, TIBC, IRON, RETICCTPCT in the last 72 hours. Sepsis Labs: No results for input(s): PROCALCITON, LATICACIDVEN in the last 168 hours.  No results found for this or any previous visit (from the past 240 hour(s)).    Radiology Studies: No results found.    Scheduled Meds: . (feeding supplement) PROSource Plus  30 mL Oral Daily  . B-complex with vitamin C  1 tablet Oral Daily  . Chlorhexidine Gluconate Cloth  6 each Topical Daily  . dronabinol  2.5 mg Oral BID AC  . feeding supplement  1 Container Oral TID BM  . feeding supplement  237 mL Oral BID BM  . folic acid  1 mg Oral Daily  . furosemide  40 mg Intravenous Daily  . hydrocerin   Topical BID  . nicotine  14 mg Transdermal Daily  . polyethylene glycol  17 g Oral Daily  . senna-docusate  1 tablet Oral QHS  . tamsulosin  0.4 mg Oral Nightly  . cyanocobalamin  1,000 mcg Oral Daily   Continuous Infusions: . 0.9 % NaCl with KCl 20 mEq / L 75 mL/hr at 12/11/20 0114  . magnesium sulfate bolus IVPB       LOS: 38 days      Time spent: 20 minutes   Dessa Phi, DO [facility] 12/11/2020, 10:01  AM   Available via Epic secure chat 7am-7pm After these hours, please refer to coverage provider listed on [email or web address]

## 2020-12-12 DIAGNOSIS — A499 Bacterial infection, unspecified: Secondary | ICD-10-CM | POA: Diagnosis not present

## 2020-12-12 DIAGNOSIS — Z1612 Extended spectrum beta lactamase (ESBL) resistance: Secondary | ICD-10-CM | POA: Diagnosis not present

## 2020-12-12 LAB — BASIC METABOLIC PANEL
Anion gap: 7 (ref 5–15)
BUN: 13 mg/dL (ref 8–23)
CO2: 22 mmol/L (ref 22–32)
Calcium: 12 mg/dL — ABNORMAL HIGH (ref 8.9–10.3)
Chloride: 107 mmol/L (ref 98–111)
Creatinine, Ser: 1.12 mg/dL (ref 0.61–1.24)
GFR, Estimated: >60 mL/min (ref >60)
Glucose, Bld: 94 mg/dL (ref 70–99)
Potassium: 3.8 mmol/L (ref 3.5–5.1)
Sodium: 136 mmol/L (ref 135–145)

## 2020-12-12 LAB — CBC
HCT: 25.3 % — ABNORMAL LOW (ref 39.0–52.0)
Hemoglobin: 7.7 g/dL — ABNORMAL LOW (ref 13.0–17.0)
MCH: 27.4 pg (ref 26.0–34.0)
MCHC: 30.4 g/dL (ref 30.0–36.0)
MCV: 90 fL (ref 80.0–100.0)
Platelets: 375 10*3/uL (ref 150–400)
RBC: 2.81 MIL/uL — ABNORMAL LOW (ref 4.22–5.81)
RDW: 15.6 % — ABNORMAL HIGH (ref 11.5–15.5)
WBC: 17.7 10*3/uL — ABNORMAL HIGH (ref 4.0–10.5)
nRBC: 0 % (ref 0.0–0.2)

## 2020-12-12 LAB — GLUCOSE, CAPILLARY: Glucose-Capillary: 87 mg/dL (ref 70–99)

## 2020-12-12 LAB — MAGNESIUM: Magnesium: 2 mg/dL (ref 1.7–2.4)

## 2020-12-12 MED ORDER — FUROSEMIDE 10 MG/ML IJ SOLN
40.0000 mg | Freq: Every day | INTRAMUSCULAR | Status: DC
  Administered 2020-12-12 – 2020-12-16 (×5): 40 mg via INTRAVENOUS
  Filled 2020-12-12 (×5): qty 4

## 2020-12-12 MED ORDER — TRAMADOL HCL 50 MG PO TABS
50.0000 mg | ORAL_TABLET | Freq: Two times a day (BID) | ORAL | Status: DC | PRN
  Administered 2020-12-12 – 2020-12-19 (×5): 50 mg via ORAL
  Filled 2020-12-12 (×5): qty 1

## 2020-12-12 MED ORDER — POTASSIUM CHLORIDE IN NACL 20-0.9 MEQ/L-% IV SOLN
INTRAVENOUS | Status: DC
  Filled 2020-12-12 (×24): qty 1000

## 2020-12-12 NOTE — Progress Notes (Signed)
PROGRESS NOTE    Jamie Wilson  PZW:[id number] DOB: [birth year] DOA: 11/02/2020 PCP: Patient, [name]     Brief Narrative:  Jamie Wilson is a 65 y.o.malewith medical history significant ofbladder CA w/ chronic foley placement; bowel obstruction s/p partial bowel resection w/ colostomy. Patient presented secondary to abdominal pain and found to have evidence of sepsis possibly secondary to necrotic bladder mass. Empiric antibiotics initiated. Urology and oncology consulted and recommendations given. Hospital course complicated by hypercalcemia. Bone scan does not show any lytic or blastic bone lesions suspicious for bone mets.AKI resolved.  Still with some degree of hypercalcemia.  He also had some GI symptoms including nausea, emesis and abdominal distention.  KUB on 1/19 raise concern for SBO but GI symptoms resolved the next day.  Hospitalization further complicated by development of ESBL bacteremia.  He was started on IV Merrem and has completed 10-day course.  New events last 24 hours / Subjective: Complaining of lower abdominal pain.  He states that he was able to eat 3 bowls of potato soup last night.  Skipped breakfast this morning.  Assessment & Plan:   Principal Problem:   ESBL (extended spectrum beta-lactamase) producing bacteria infection Active Problems:   Malnutrition (HCC)   Sepsis ([place])   Protein-calorie malnutrition, severe   Lower urinary tract infectious disease   Palliative care by specialist   Goals of care, counseling/discussion   Sepsis secondary to E. 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 coli bacteremia.  Completed 10-day course of IV Merrem. -Repeat blood cultures 1/28 negative to date   Urothelial carcinoma of the bladder with chronic indwelling  Foley catheter -Foley catheter exchanged 1/25 -Follow-up with urology patient for radical cystectomy, Dr. Jeffie Pollock -Follow-up with oncology in 2 weeks, Dr. Irene Limbo   Hypercalcemia of malignancy -Status post zoledronic acid x 2  -Patient underwent bone survey during this hospitalization which was negative for metastatic disease -Initially improved, but then trended upward again -Continue lasix, IVF  -Trend BMP   AKI -Resolved  Severe protein calorie malnutrition -Estimated body mass index is 15.76 kg/m as calculated from the following:   Height as of this encounter: 6\' 1"  (1.854 m).   Weight as of this encounter: 54.2 kg. -Failure to thrive and poor PO intake  Goals of care -Palliative care consulted for goals of care discussion.  Patient desiring full scope of care at this time.  Has poor insight into his medical condition   DVT prophylaxis:  SCDs Start: 11/03/20 2329  Code Status: Full code Family Communication: No family at bedside Disposition Plan:  Status is: Inpatient  Remains inpatient appropriate because:Inpatient level of care appropriate due to severity of illness   Dispo: The patient is from: Home              Anticipated d/c is to: SNF              Anticipated d/c date is: 2 days              Patient currently is not medically stable to d/c.  Remains on IV Lasix, IV fluid with potassium supplementation   Difficult to place patient No   Consultants:   Urology  Oncology  Palliative care medicine   Antimicrobials:  Anti-infectives (From admission, onward)   Start     Dose/Rate Route  Frequency Ordered Stop   11/23/20 1000  meropenem (MERREM) 1 g in sodium chloride 0.9 % 100 mL IVPB        1 g 200 mL/hr over 30 Minutes Intravenous Every 8 hours 11/23/20 0937 12/03/20 1857   11/12/20 2000  ciprofloxacin (CIPRO) tablet 500 mg        500 mg Oral 2 times daily 11/12/20 1543 11/17/20 2359   11/10/20 0800  ciprofloxacin (CIPRO) tablet 500 mg  Status:   Discontinued        500 mg Oral Daily with breakfast 11/09/20 1357 11/12/20 1543   11/06/20 1200  ciprofloxacin (CIPRO) tablet 500 mg  Status:  Discontinued        500 mg Oral 2 times daily 11/06/20 1114 11/09/20 1357   11/05/20 1500  cefdinir (OMNICEF) capsule 300 mg  Status:  Discontinued        300 mg Oral Every 12 hours 11/05/20 1337 11/06/20 1114   11/03/20 2200  vancomycin (VANCOREADY) IVPB 500 mg/100 mL  Status:  Discontinued        500 mg 100 mL/hr over 60 Minutes Intravenous Every 12 hours 11/03/20 1402 11/05/20 1335   11/03/20 1415  piperacillin-tazobactam (ZOSYN) IVPB 3.375 g  Status:  Discontinued        3.375 g 12.5 mL/hr over 240 Minutes Intravenous Every 8 hours 11/03/20 1401 11/05/20 1337   11/03/20 0645  vancomycin (VANCOCIN) IVPB 1000 mg/200 mL premix        1,000 mg 200 mL/hr over 60 Minutes Intravenous  Once 11/03/20 0631 11/03/20 1147   11/03/20 0600  piperacillin-tazobactam (ZOSYN) IVPB 4.5 g  Status:  Discontinued        4.5 g 200 mL/hr over 30 Minutes Intravenous  Once 11/03/20 0546 11/03/20 0547   11/03/20 0600  piperacillin-tazobactam (ZOSYN) IVPB 3.375 g        3.375 g 100 mL/hr over 30 Minutes Intravenous To Emergency Dept 11/03/20 0548 11/03/20 0650       Objective: Vitals:   12/11/20 1358 12/11/20 2056 12/11/20 2121 12/12/20 0646  BP: 105/66 (!) 100/59 106/66 111/77  Pulse: 78 (!) 110 (!) 108 (!) 104  Resp: 16     Temp: 97.9 F (36.6 C) 98.3 F (36.8 C)  98.2 F (36.8 C)  TempSrc: Oral Oral  Oral  SpO2: 96% 95% 96% 96%  Weight:      Height:        Intake/Output Summary (Last 24 hours) at 12/12/2020 1029 Last data filed at 12/11/2020 2225 Gross per 24 hour  Intake 1310 ml  Output 2425 ml  Net -1115 ml   Filed Weights   11/23/20 1017 11/24/20 0508 12/05/20 0644  Weight: 59.9 kg 58.1 kg 54.2 kg     Examination: General exam: Appears calm and comfortable, disheveled and chronically ill-appearing Respiratory system: Clear to auscultation.  Respiratory effort normal. Cardiovascular system: S1 & S2 heard, RRR. No pedal edema. Gastrointestinal system: Abdomen is nondistended, soft and nontender. Normal bowel sounds heard. Central nervous system: Alert and oriented. Non focal exam. Speech clear  Extremities: Symmetric in appearance bilaterally  Skin: No rashes, lesions or ulcers on exposed skin    Data Reviewed: I have personally reviewed following labs and imaging studies  CBC: Recent Labs  Lab 12/07/20 0418 12/08/20 0323 12/09/20 0524 12/10/20 0120 12/11/20 0430 12/12/20 0300  WBC 16.3* 16.4* 18.7* 18.0* 17.7* 17.7*  NEUTROABS 8.0*  --   --   --   --   --  HGB 7.3* 7.2* 7.5* 8.0* 7.7* 7.7*  HCT 24.0* 23.6* 24.6* 27.3* 25.6* 25.3*  MCV 89.2 89.1 89.5 91.9 90.5 90.0  PLT 328 324 353 372 373 268   Basic Metabolic Panel: Recent Labs  Lab 12/06/20 0513 12/07/20 0418 12/08/20 0323 12/09/20 0524 12/10/20 0120 12/11/20 0430 12/12/20 0300  NA 134* 138 137 135 138 138 136  K 3.3* 3.5 2.8* 3.3* 4.0 3.9 3.8  CL 104 106 106 107 109 109 107  CO2 22 24 22 23  21* 22 22  GLUCOSE 95 87 91 98 90 100* 94  BUN 12 12 12 13 15 14 13   CREATININE 0.96 1.03 1.11 1.02 1.12 1.14 1.12  CALCIUM 12.5* 12.9* 12.1* 12.1* 12.2* 12.3* 12.0*  MG 2.3 1.9  --  1.7 1.7 1.5* 2.0  PHOS 2.6 2.3*  --   --   --   --   --    GFR: Estimated Creatinine Clearance: 50.4 mL/min (by C-G formula based on SCr of 1.12 mg/dL). Liver Function Tests: Recent Labs  Lab 12/06/20 0513 12/07/20 0418  ALBUMIN 2.0* 3.2*   No results for input(s): LIPASE, AMYLASE in the last 168 hours. No results for input(s): AMMONIA in the last 168 hours. Coagulation Profile: No results for input(s): INR, PROTIME in the last 168 hours. Cardiac Enzymes: No results for input(s): CKTOTAL, CKMB, CKMBINDEX, TROPONINI in the last 168 hours. BNP (last 3 results) No results for input(s): PROBNP in the last 8760 hours. HbA1C: No results for input(s): HGBA1C in the last 72  hours. CBG: Recent Labs  Lab 12/08/20 0633 12/09/20 0620 12/10/20 0520 12/11/20 0500 12/12/20 0644  GLUCAP 94 97 92 90 87   Lipid Profile: No results for input(s): CHOL, HDL, LDLCALC, TRIG, CHOLHDL, LDLDIRECT in the last 72 hours. Thyroid Function Tests: No results for input(s): TSH, T4TOTAL, FREET4, T3FREE, THYROIDAB in the last 72 hours. Anemia Panel: No results for input(s): VITAMINB12, FOLATE, FERRITIN, TIBC, IRON, RETICCTPCT in the last 72 hours. Sepsis Labs: No results for input(s): PROCALCITON, LATICACIDVEN in the last 168 hours.  No results found for this or any previous visit (from the past 240 hour(s)).    Radiology Studies: No results found.    Scheduled Meds: . (feeding supplement) PROSource Plus  30 mL Oral Daily  . B-complex with vitamin C  1 tablet Oral Daily  . Chlorhexidine Gluconate Cloth  6 each Topical Daily  . dronabinol  2.5 mg Oral BID AC  . feeding supplement  1 Container Oral TID BM  . feeding supplement  237 mL Oral BID BM  . folic acid  1 mg Oral Daily  . furosemide  40 mg Intravenous Daily  . hydrocerin   Topical BID  . nicotine  14 mg Transdermal Daily  . polyethylene glycol  17 g Oral BID  . senna-docusate  1 tablet Oral QHS  . tamsulosin  0.4 mg Oral Nightly  . cyanocobalamin  1,000 mcg Oral Daily   Continuous Infusions: . 0.9 % NaCl with KCl 20 mEq / L 100 mL/hr at 12/12/20 1010     LOS: 39 days      Time spent: 20 minutes   Dessa Phi, DO [facility] 12/12/2020, 10:29 AM   Available via Epic secure chat 7am-7pm After these hours, please refer to coverage provider listed on [email or web address]

## 2020-12-12 NOTE — Progress Notes (Signed)
Palliative care brief progress note  I stopped by to check in on [name] today.  He was sleeping at time I attempted to stop by.  Discussed with bedside RN.  He has been complaining of pain this morning and, as it now seems to be under control, I let him rest.  Overall, his goal is to transition to skilled facility to see if he can enough nutritional reserve and functional status to be a candidate for further treatment of his cancer, such as chemotherapy or radical cystectomy.  Plan is to likely transition to skilled facility later this week.  I recommend palliative care follow-up with him there to continue conversation about goals of care based upon his clinical course of the next couple of weeks during trial of rehab.  Please call if there are other areas with which we can be of assistance in the care of Jamie Wilson moving forward.  Micheline Rough, MD [facility] [phone]  NO CHARGE NOTE

## 2020-12-12 NOTE — Progress Notes (Signed)
PT Cancellation Note  Patient Details Name: Jamie Wilson MRN: [id number] DOB: 1955-12-23   Cancelled Treatment:    Reason Eval/Treat Not Completed: Attempted PT tx session-pt refused to participate on today despite encouragement.    [facility]  Office: [phone] Pager: [phone]

## 2020-12-13 DIAGNOSIS — Z1612 Extended spectrum beta lactamase (ESBL) resistance: Secondary | ICD-10-CM | POA: Diagnosis not present

## 2020-12-13 DIAGNOSIS — A499 Bacterial infection, unspecified: Secondary | ICD-10-CM | POA: Diagnosis not present

## 2020-12-13 LAB — CBC
HCT: 26.5 % — ABNORMAL LOW (ref 39.0–52.0)
Hemoglobin: 7.8 g/dL — ABNORMAL LOW (ref 13.0–17.0)
MCH: 26.4 pg (ref 26.0–34.0)
MCHC: 29.4 g/dL — ABNORMAL LOW (ref 30.0–36.0)
MCV: 89.8 fL (ref 80.0–100.0)
Platelets: 402 10*3/uL — ABNORMAL HIGH (ref 150–400)
RBC: 2.95 MIL/uL — ABNORMAL LOW (ref 4.22–5.81)
RDW: 15.7 % — ABNORMAL HIGH (ref 11.5–15.5)
WBC: 18.2 10*3/uL — ABNORMAL HIGH (ref 4.0–10.5)
nRBC: 0 % (ref 0.0–0.2)

## 2020-12-13 LAB — BASIC METABOLIC PANEL
Anion gap: 9 (ref 5–15)
BUN: 15 mg/dL (ref 8–23)
CO2: 20 mmol/L — ABNORMAL LOW (ref 22–32)
Calcium: 11.9 mg/dL — ABNORMAL HIGH (ref 8.9–10.3)
Chloride: 107 mmol/L (ref 98–111)
Creatinine, Ser: 1.22 mg/dL (ref 0.61–1.24)
GFR, Estimated: >60 mL/min (ref >60)
Glucose, Bld: 95 mg/dL (ref 70–99)
Potassium: 3.9 mmol/L (ref 3.5–5.1)
Sodium: 136 mmol/L (ref 135–145)

## 2020-12-13 LAB — MAGNESIUM: Magnesium: 2 mg/dL (ref 1.7–2.4)

## 2020-12-13 LAB — GLUCOSE, CAPILLARY: Glucose-Capillary: 76 mg/dL (ref 70–99)

## 2020-12-13 MED ORDER — SORBITOL 70 % SOLN
960.0000 mL | TOPICAL_OIL | Freq: Once | ORAL | Status: AC
  Administered 2020-12-13: 960 mL via RECTAL
  Filled 2020-12-13: qty 473

## 2020-12-13 NOTE — Progress Notes (Signed)
PROGRESS NOTE    Jamie Wilson  UDJ:[id number] DOB: 04-20-1956 DOA: 11/02/2020 PCP: Patient, [name]     Brief Narrative:  Jamie Wilson is a 65 y.o.malewith medical history significant ofbladder CA w/ chronic foley placement; bowel obstruction s/p partial bowel resection w/ colostomy. Patient presented secondary to abdominal pain and found to have evidence of sepsis possibly secondary to necrotic bladder mass. Empiric antibiotics initiated. Urology and oncology consulted and recommendations given. Hospital course complicated by hypercalcemia. Bone scan does not show any lytic or blastic bone lesions suspicious for bone mets.AKI resolved.  Still with some degree of hypercalcemia.  He also had some GI symptoms including nausea, emesis and abdominal distention.  KUB on 1/19 raise concern for SBO but GI symptoms resolved the next day.  Hospitalization further complicated by development of ESBL bacteremia.  He was started on IV Merrem and has completed 10-day course.  New events last 24 hours / Subjective: Asking for an enema.  He had received a smog enema through his stoma during his hospitalization in October and patient is asking for this again due to continued abdominal discomfort  Assessment & Plan:   Principal Problem:   ESBL (extended spectrum beta-lactamase) producing bacteria infection Active Problems:   Malnutrition ([place])   Sepsis ([place])   Protein-calorie malnutrition, severe   Lower urinary tract infectious disease   Palliative care by specialist   Goals of care, counseling/discussion   Sepsis secondary to E. 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 coli bacteremia.  Completed 10-day course of IV Merrem. 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 Jeffie Pollock -Follow-up with oncology in 2 weeks, Dr. Irene Limbo   Hypercalcemia of malignancy -Status post zoledronic acid x 2  -Patient underwent bone survey during this hospitalization which was negative for metastatic disease -Initially improved, but then trended upward again -Continue lasix, IVF  -Trend BMP   AKI -Resolved  Severe protein calorie malnutrition -Estimated body mass index is 15.76 kg/m as calculated from the following:   Height as of this encounter: 6\' 1"  (1.854 m).   Weight as of this encounter: 54.2 kg. -Failure to thrive and poor PO intake  Goals of care -Palliative care consulted for goals of care discussion.  Patient desiring full scope of care at this time.  Has poor insight into his medical condition   DVT prophylaxis:  SCDs Start: 11/03/20 2329  Code Status: Full code Family Communication: No family at bedside Disposition Plan:  Status is: Inpatient  Remains inpatient appropriate because:Inpatient level of care appropriate due to severity of illness   Dispo: The patient is from: Home              Anticipated d/c is to: SNF              Anticipated d/c date is: 2 days              Patient currently is not medically stable to d/c.  Remains on IV Lasix, IV fluid with potassium supplementation   Difficult to place patient No   Consultants:   Urology  Oncology  Palliative care medicine   Antimicrobials:  Anti-infectives (From admission, onward)   Start  Dose/Rate Route Frequency Ordered Stop   11/23/20 1000  meropenem (MERREM) 1 g in sodium chloride 0.9 % 100 mL IVPB        1 g 200 mL/hr over 30 Minutes Intravenous Every 8 hours 11/23/20 0937 12/03/20 1857   11/12/20 2000  ciprofloxacin (CIPRO) tablet 500 mg        500 mg Oral 2 times daily 11/12/20 1543 11/17/20 2359   11/10/20 0800  ciprofloxacin  (CIPRO) tablet 500 mg  Status:  Discontinued        500 mg Oral Daily with breakfast 11/09/20 1357 11/12/20 1543   11/06/20 1200  ciprofloxacin (CIPRO) tablet 500 mg  Status:  Discontinued        500 mg Oral 2 times daily 11/06/20 1114 11/09/20 1357   11/05/20 1500  cefdinir (OMNICEF) capsule 300 mg  Status:  Discontinued        300 mg Oral Every 12 hours 11/05/20 1337 11/06/20 1114   11/03/20 2200  vancomycin (VANCOREADY) IVPB 500 mg/100 mL  Status:  Discontinued        500 mg 100 mL/hr over 60 Minutes Intravenous Every 12 hours 11/03/20 1402 11/05/20 1335   11/03/20 1415  piperacillin-tazobactam (ZOSYN) IVPB 3.375 g  Status:  Discontinued        3.375 g 12.5 mL/hr over 240 Minutes Intravenous Every 8 hours 11/03/20 1401 11/05/20 1337   11/03/20 0645  vancomycin (VANCOCIN) IVPB 1000 mg/200 mL premix        1,000 mg 200 mL/hr over 60 Minutes Intravenous  Once 11/03/20 0631 11/03/20 1147   11/03/20 0600  piperacillin-tazobactam (ZOSYN) IVPB 4.5 g  Status:  Discontinued        4.5 g 200 mL/hr over 30 Minutes Intravenous  Once 11/03/20 0546 11/03/20 0547   11/03/20 0600  piperacillin-tazobactam (ZOSYN) IVPB 3.375 g        3.375 g 100 mL/hr over 30 Minutes Intravenous To Emergency Dept 11/03/20 0548 11/03/20 0650       Objective: Vitals:   12/12/20 0646 12/12/20 1459 12/12/20 2040 12/13/20 0519  BP: 111/77 110/63 135/71 109/64  Pulse: (!) 104 (!) 109 98 (!) 102  Resp:  17 18 18   Temp: 98.2 F (36.8 C) 97.7 F (36.5 C) 98.2 F (36.8 C) 98.5 F (36.9 C)  TempSrc: Oral Oral Oral Oral  SpO2: 96% 96% 95% 95%  Weight:      Height:        Intake/Output Summary (Last 24 hours) at 12/13/2020 1045 Last data filed at 12/13/2020 1000 Gross per 24 hour  Intake 3643.33 ml  Output 3075 ml  Net 568.33 ml   Filed Weights   11/23/20 1017 11/24/20 0508 12/05/20 0644  Weight: 59.9 kg 58.1 kg 54.2 kg     Examination: General exam: Appears calm and comfortable, muscle wasting Respiratory  system: Clear to auscultation. Respiratory effort normal. Cardiovascular system: S1 & S2 heard, RRR. No pedal edema. Gastrointestinal system: Abdomen is nondistended, soft  Central nervous system: Alert and oriented. Non focal exam. Speech clear  Extremities: Symmetric in appearance bilaterally  Skin: No rashes, lesions or ulcers on exposed skin  Psychiatry: Stable  Data Reviewed: I have personally reviewed following labs and imaging studies  CBC: Recent Labs  Lab 12/07/20 0418 12/08/20 0323 12/09/20 0524 12/10/20 0120 12/11/20 0430 12/12/20 0300 12/13/20 0319  WBC 16.3*   < > 18.7* 18.0* 17.7* 17.7* 18.2*  NEUTROABS 8.0*  --   --   --   --   --   --  HGB 7.3*   < > 7.5* 8.0* 7.7* 7.7* 7.8*  HCT 24.0*   < > 24.6* 27.3* 25.6* 25.3* 26.5*  MCV 89.2   < > 89.5 91.9 90.5 90.0 89.8  PLT 328   < > 353 372 373 375 402*   < > = values in this interval not displayed.   Basic Metabolic Panel: Recent Labs  Lab 12/07/20 0418 12/08/20 0323 12/09/20 0524 12/10/20 0120 12/11/20 0430 12/12/20 0300 12/13/20 0319  NA 138   < > 135 138 138 136 136  K 3.5   < > 3.3* 4.0 3.9 3.8 3.9  CL 106   < > 107 109 109 107 107  CO2 24   < > 23 21* 22 22 20*  GLUCOSE 87   < > 98 90 100* 94 95  BUN 12   < > 13 15 14 13 15   CREATININE 1.03   < > 1.02 1.12 1.14 1.12 1.22  CALCIUM 12.9*   < > 12.1* 12.2* 12.3* 12.0* 11.9*  MG 1.9  --  1.7 1.7 1.5* 2.0 2.0  PHOS 2.3*  --   --   --   --   --   --    < > = values in this interval not displayed.   GFR: Estimated Creatinine Clearance: 46.3 mL/min (by C-G formula based on SCr of 1.22 mg/dL). Liver Function Tests: Recent Labs  Lab 12/07/20 0418  ALBUMIN 3.2*   No results for input(s): LIPASE, AMYLASE in the last 168 hours. No results for input(s): AMMONIA in the last 168 hours. Coagulation Profile: No results for input(s): INR, PROTIME in the last 168 hours. Cardiac Enzymes: No results for input(s): CKTOTAL, CKMB, CKMBINDEX, TROPONINI in the last  168 hours. BNP (last 3 results) No results for input(s): PROBNP in the last 8760 hours. HbA1C: No results for input(s): HGBA1C in the last 72 hours. CBG: Recent Labs  Lab 12/09/20 0620 12/10/20 0520 12/11/20 0500 12/12/20 0644 12/13/20 0509  GLUCAP 97 92 90 87 76   Lipid Profile: No results for input(s): CHOL, HDL, LDLCALC, TRIG, CHOLHDL, LDLDIRECT in the last 72 hours. Thyroid Function Tests: No results for input(s): TSH, T4TOTAL, FREET4, T3FREE, THYROIDAB in the last 72 hours. Anemia Panel: No results for input(s): VITAMINB12, FOLATE, FERRITIN, TIBC, IRON, RETICCTPCT in the last 72 hours. Sepsis Labs: No results for input(s): PROCALCITON, LATICACIDVEN in the last 168 hours.  No results found for this or any previous visit (from the past 240 hour(s)).    Radiology Studies: No results found.    Scheduled Meds: . (feeding supplement) PROSource Plus  30 mL Oral Daily  . B-complex with vitamin C  1 tablet Oral Daily  . Chlorhexidine Gluconate Cloth  6 each Topical Daily  . dronabinol  2.5 mg Oral BID AC  . feeding supplement  1 Container Oral TID BM  . feeding supplement  237 mL Oral BID BM  . folic acid  1 mg Oral Daily  . furosemide  40 mg Intravenous Daily  . hydrocerin   Topical BID  . nicotine  14 mg Transdermal Daily  . polyethylene glycol  17 g Oral BID  . senna-docusate  1 tablet Oral QHS  . sorbitol, milk of mag, mineral oil, glycerin (SMOG) enema  960 mL Rectal Once  . tamsulosin  0.4 mg Oral Nightly  . cyanocobalamin  1,000 mcg Oral Daily   Continuous Infusions: . 0.9 % NaCl with KCl 20 mEq / L 100 mL/hr at  12/12/20 2227     LOS: 40 days      Time spent: 20 minutes   Dessa Phi, DO [facility] 12/13/2020, 10:45 AM   Available via Epic secure chat 7am-7pm After these hours, please refer to coverage provider listed on [email or web address]

## 2020-12-13 NOTE — Consult Note (Addendum)
[place] Nurse ostomy follow up Requested to assist with SMOG enema administration via stoma. Pt is familiar to [facility] team from previous admissions. He has a colostomy in place since 2018, according to EMR.  He has a very narrow tight stoma when finger inserted to assess the direction before cone tip was inserted; no stool was palpated and 1000 cc of the SMOG enema instilled. Stoma red and viable; 1 inch and flush with skin level, visible hernia surrounding. Pt tolerated instillation, then cramping started and enema was stopped at that time. Mod amt formed stool palpated inside stoma and was passed after the enema. Returned 800 cc of brown liquid stool with significant chunks of formed stool. Replaced 2 piece pouch and barrier ring. Pt states he is feeling better. One hours spent performing this consult. Pt states he was independent with ostomy pouching and emptying routines prior to admission.  Please re-consult if further assistance is needed.  Thank-you,  Julien Girt MSN, [place], [place], [place], [place]

## 2020-12-13 NOTE — TOC Progression Note (Signed)
Transition of Care [facility]) - Progression Note    Patient Details  Name: Jamie Wilson MRN: [id number] Date of Birth: November 24, 1955  Transition of Care [facility]) CM/SW [place], [place] Phone Number: 12/13/2020, 5:35 PM  Clinical Narrative:    Patient not medically stable to transfer to SNF.  TOC will continue to follow this patient.   Expected Discharge Plan: Skilled Nursing Facility Barriers to Discharge: Continued Medical Work up  Expected Discharge Plan and Services Expected Discharge Plan: [place]         Expected Discharge Date:  (unknown)                                     Social Determinants of Health (SDOH) Interventions    Readmission Risk Interventions Readmission Risk Prevention Plan 10/04/2020  Transportation Screening Complete  PCP or Specialist Appt within 5-7 Days Complete  Home Care Screening Complete  Medication Review (RN CM) Complete

## 2020-12-14 DIAGNOSIS — Z978 Presence of other specified devices: Secondary | ICD-10-CM

## 2020-12-14 LAB — CBC
HCT: 25 % — ABNORMAL LOW (ref 39.0–52.0)
Hemoglobin: 7.5 g/dL — ABNORMAL LOW (ref 13.0–17.0)
MCH: 27.2 pg (ref 26.0–34.0)
MCHC: 30 g/dL (ref 30.0–36.0)
MCV: 90.6 fL (ref 80.0–100.0)
Platelets: 379 10*3/uL (ref 150–400)
RBC: 2.76 MIL/uL — ABNORMAL LOW (ref 4.22–5.81)
RDW: 15.9 % — ABNORMAL HIGH (ref 11.5–15.5)
WBC: 16.7 10*3/uL — ABNORMAL HIGH (ref 4.0–10.5)
nRBC: 0 % (ref 0.0–0.2)

## 2020-12-14 LAB — BASIC METABOLIC PANEL
Anion gap: 8 (ref 5–15)
BUN: 14 mg/dL (ref 8–23)
CO2: 21 mmol/L — ABNORMAL LOW (ref 22–32)
Calcium: 11.7 mg/dL — ABNORMAL HIGH (ref 8.9–10.3)
Chloride: 108 mmol/L (ref 98–111)
Creatinine, Ser: 0.79 mg/dL (ref 0.61–1.24)
GFR, Estimated: >60 mL/min (ref >60)
Glucose, Bld: 89 mg/dL (ref 70–99)
Potassium: 3.8 mmol/L (ref 3.5–5.1)
Sodium: 137 mmol/L (ref 135–145)

## 2020-12-14 LAB — ALBUMIN: Albumin: 2.6 g/dL — ABNORMAL LOW (ref 3.5–5.0)

## 2020-12-14 LAB — GLUCOSE, CAPILLARY: Glucose-Capillary: 83 mg/dL (ref 70–99)

## 2020-12-14 NOTE — TOC Progression Note (Signed)
Transition of Care [facility]) - Progression Note    Patient Details  Name: Jamie Wilson MRN: [id number] Date of Birth: [birth year]  Transition of Care [facility]) CM/SW [place], LCSW Phone Number: 12/14/2020, 10:49 AM  Clinical Narrative:    Per admission staff Misty at [facility] the patient previous managed medicaid authorization expired therefore another authorization will need to be initiated closer to when the patient is ready to discharge.  TOC will continue to update the SNF.   Expected Discharge Plan: Skilled Nursing Facility Barriers to Discharge: Continued Medical Work up  Expected Discharge Plan and Services Expected Discharge Plan: [place]         Expected Discharge Date:  (unknown)                                     Social Determinants of Health (SDOH) Interventions    Readmission Risk Interventions Readmission Risk Prevention Plan 10/04/2020  Transportation Screening Complete  PCP or Specialist Appt within 5-7 Days Complete  Home Care Screening Complete  Medication Review (RN CM) Complete

## 2020-12-14 NOTE — Progress Notes (Signed)
PT Cancellation Note  Patient Details Name: Jamie Wilson MRN: [id number] DOB: 1956-01-04   Cancelled Treatment:    Reason Eval/Treat Not Completed:  Attempted tx session. Pt continues to refuse to mobilize/participate despite encouragement. Will check back another day. If pt continues to refuse to participate with therapy, will sign off.    [facility]  Office: [phone] Pager: [phone]

## 2020-12-14 NOTE — Progress Notes (Signed)
Nutrition Follow-up  DOCUMENTATION CODES:   Severe malnutrition in context of chronic illness,Underweight  INTERVENTION:   -Prosource Plus PO daily, each provides 100 kcals and 15g protein  -Boost Breeze po TID, each supplement provides 250 kcal and 9 grams of protein  -Ensure Enlive po BID, each supplement provides 350 kcal and 20 grams of protein  Recommend small bore NGT vs. PEG for TF to provide adequate nutrition (pt continues to refuse meals).  NUTRITION DIAGNOSIS:   Severe Malnutrition related to chronic illness,cancer and cancer related treatments as evidenced by severe fat depletion,severe muscle depletion,percent weight loss.  Ongoing.  GOAL:   Patient will meet greater than or equal to 90% of their needs  Not meeting.  MONITOR:   PO intake,Supplement acceptance,Labs,I & O's,Weight trends  ASSESSMENT:   65 y.o. 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 Patient presented secondary to abdominal pain and found to have evidence of sepsis possibly secondary to necrotic bladder mass.  Patient currently refusing meals. Accepting only Boost Breeze and Prosource supplements. This alone won't meet pt's nutritional needs.  Per palliative care note on 2/14, recommending palliative care follow up with pt at [facility].   Admission weight: 111 lbs. Current weight: 119 lbs.  Medications: B complex w/ Vitamin C, Marinol, Folic acid, Lasix, Miralax, Vitamin B-12  Labs reviewed:  CBGs: 76-83  Diet Order:   Diet Order            DIET SOFT Room service appropriate? Yes; Fluid consistency: Thin  Diet effective now                 EDUCATION NEEDS:   No education needs have been identified at this time  Skin:  Skin Assessment: Reviewed RN Assessment  Last BM:  2/8  Height:   Ht Readings from Last 1 Encounters:  11/02/20 6\' 1"  (1.854 m)    Weight:   Wt Readings from  Last 1 Encounters:  12/05/20 54.2 kg   BMI:  Body mass index is 15.76 kg/m.  Estimated Nutritional Needs:   Kcal:  2100-2300  Protein:  100-115g  Fluid:  2.1L/day  Clayton Bibles, MS, RD, LDN Inpatient Clinical Dietitian Contact information available via Amion

## 2020-12-14 NOTE — Consult Note (Addendum)
[place] Nurse wound follow up Bedside nurse is currently emptying a very full ostomy pouch; pt has large amt semi-formed stool today after enema which was administered yesterday.  No need for another enema.  Please re-consult if further assistance is needed.  Thank-you,  Julien Girt MSN, [place], [place], [place], [place]

## 2020-12-14 NOTE — Progress Notes (Signed)
PROGRESS NOTE  Jamie Wilson TOI:[id number] DOB: 04-Feb-1956   PCP: Patient, [name]  Patient is from: Home  DOA: 11/02/2020 LOS: 79  Chief complaints: Abdominal pain  Brief Narrative / Interim history: 65 y.o.malewith PMH ofbladder CA w/ chronic foley placement; bowel obstruction s/p partial bowel resection w/ colostomy. Patient presented secondary to abdominal pain and found to have evidence of sepsis possibly secondary to necrotic bladder mass. Empiric antibiotics initiated. Urology and oncology consulted aand recommendations given. Hospital course complicated by hypercalcemia,.. Bone scan does not show any lytic or blastic bone lesions suspicious for bone mets. AKI resolved.  Still with some degree of hypercalcemia.  He also had some GI symptoms including nausea, emesis and abdominal distention.  KUB on 1/19 raise concern for SBO but GI symptoms resolved the next day.  Hospitalization further complicated by ESBL bacteremia for which he completed IV meropenem for 10 days.   Subjective: Seen and examined earlier this morning.  No major events overnight of this morning.  Had some constipation that has resolved with smog enema through his stoma.  Denies chest pain, dyspnea, nausea or vomiting.  Objective: Vitals:   12/13/20 1425 12/13/20 2116 12/14/20 0525 12/14/20 1330  BP: 110/65 110/68 107/68 108/64  Pulse: (!) 109 100 62 75  Resp: 16 18 18 18   Temp: 98.2 F (36.8 C) 98 F (36.7 C) 97.7 F (36.5 C) 97.8 F (36.6 C)  TempSrc: Oral Oral Oral Oral  SpO2: 96% 95% 95% 94%  Weight:      Height:        Intake/Output Summary (Last 24 hours) at 12/14/2020 1340 Last data filed at 12/14/2020 8338 Gross per 24 hour  Intake 4200 ml  Output 3275 ml  Net 925 ml   Filed Weights   11/23/20 1017 11/24/20 0508 12/05/20 0644  Weight: 59.9 kg 58.1 kg 54.2 kg    Examination:  GENERAL: Frail looking elderly male.  No apparent distress. HEENT: MMM.  Vision and hearing grossly intact.   NECK: Supple.  No apparent JVD.  RESP: On RA.  No IWOB.  Fair aeration bilaterally. CVS:  RRR. Heart sounds normal.  ABD/GI/GU: BS+. Abd soft, NTND.  Ostomy bag with soft stool.  Foley in place.  Clear urine. MSK/EXT:  Moves extremities.  Significant muscle mass and subcu fat loss. SKIN: no apparent skin lesion or wound NEURO: Awake, alert and oriented appropriately.  No apparent focal neuro deficit. PSYCH: Calm. Normal affect.   Procedures:  None  Microbiology summarized: 1/6-influenza and COVID-19 PCR nonreactive. 1/6-urine culture with multiple species 1/6-blood culture NGTD. 1/7-urine cultures with multiple species.  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 -Initially completed 2 weeks of Cipro per urology recommendation -Repeat blood cultures showed E. coli bacteremia.  Completed 10 days of IV meropenem.  Debility/physical deconditioning/increased risk of fall-noncompliance.  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 Jeffie Pollock -Follow-up with oncology, Dr. Irene Limbo  Hypercalcemia of malignancy: Calcium > 15 on admit>>> 11.7 (corrects to 12.8).  PTH within normal. -Received zoledronic acid x2 -Continue IV NS with IV Lasix.   Colostomy status Constipation  AKI: Baseline Cr 0.8.  Resolved. -IV fluid as above  Anemia of chronic disease: Hgb relatively stable.  Anemia panel consistent with ACD Recent Labs    12/05/20 0320 12/06/20 0513 12/07/20 2505  12/08/20 0323 12/09/20 0524 12/10/20 0120 12/11/20 0430 12/12/20 0300 12/13/20 0319 12/14/20 0530  HGB 8.5* 8.3* 7.3* 7.2* 7.5* 8.0* 7.7* 7.7* 7.8* 7.5*  -Monitor intermittently  Tobacco use disorder: -Nicotine patch  Severe  protein calorie malnutrition Body mass index is 15.76 kg/m. Nutrition Problem: Severe Malnutrition Etiology: chronic illness,cancer and cancer related treatments Signs/Symptoms: severe fat depletion,severe muscle depletion,percent weight loss Interventions: Ensure Enlive (each supplement provides 350kcal and 20 grams of protein),MVI,Prostat    Wt Readings from Last 10 Encounters:  12/05/20 54.2 kg  10/12/20 58.1 kg  08/28/20 61.1 kg  07/20/20 68 kg  06/26/20 68 kg  04/03/20 72.6 kg  10/17/19 72.6 kg   DVT prophylaxis:  SCDs Start: 11/03/20 2329  Code Status: Full code Family Communication: Patient and/or RN. Available if any question.  Status is: Inpatient  Remains inpatient appropriate because:Persistent severe electrolyte disturbances, Unsafe d/c plan, IV treatments appropriate due to intensity of illness or inability to take PO and Inpatient level of care appropriate due to severity of illness   Dispo: The patient is from: Home              Anticipated d/c is to: SNF              Anticipated d/c date is: 2 days               Patient currently is not medically stable to d/c.    Difficult to place patient: Yes       Consultants:  Urology Oncology   Sch Meds:  Scheduled Meds: . (feeding supplement) PROSource Plus  30 mL Oral Daily  . B-complex with vitamin C  1 tablet Oral Daily  . Chlorhexidine Gluconate Cloth  6 each Topical Daily  . dronabinol  2.5 mg Oral BID AC  . feeding supplement  1 Container Oral TID BM  . feeding supplement  237 mL Oral BID BM  . folic acid  1 mg Oral Daily  . furosemide  40 mg Intravenous Daily  . hydrocerin   Topical BID  . nicotine  14 mg Transdermal Daily  . polyethylene glycol  17 g Oral BID  . senna-docusate  1 tablet Oral QHS  . tamsulosin  0.4 mg Oral Nightly  . cyanocobalamin  1,000 mcg Oral Daily   Continuous Infusions: . 0.9 % NaCl with KCl 20 mEq / L 100 mL/hr at 12/14/20 1139   PRN Meds:.acetaminophen (TYLENOL)  oral liquid 160 mg/5 mL, hydrOXYzine, ondansetron **OR** ondansetron (ZOFRAN) IV, oxyCODONE, sodium chloride flush, sodium chloride flush, traMADol  Antimicrobials: Anti-infectives (From admission, onward)   Start     Dose/Rate Route Frequency Ordered Stop   11/23/20 1000  meropenem (MERREM) 1 g in sodium chloride 0.9 % 100 mL IVPB        1 g 200 mL/hr over 30 Minutes Intravenous Every 8 hours 11/23/20 0937 12/03/20 1857   11/12/20 2000  ciprofloxacin (CIPRO) tablet 500 mg        500 mg Oral 2 times daily 11/12/20 1543 11/17/20 2359   11/10/20 0800  ciprofloxacin (CIPRO) tablet 500 mg  Status:  Discontinued        500 mg Oral Daily with breakfast 11/09/20 1357 11/12/20 1543   11/06/20 1200  ciprofloxacin (CIPRO) tablet 500 mg  Status:  Discontinued        500 mg Oral 2 times daily 11/06/20 1114 11/09/20 1357   11/05/20 1500  cefdinir (OMNICEF) capsule 300 mg  Status:  Discontinued  300 mg Oral Every 12 hours 11/05/20 1337 11/06/20 1114   11/03/20 2200  vancomycin (VANCOREADY) IVPB 500 mg/100 mL  Status:  Discontinued        500 mg 100 mL/hr over 60 Minutes Intravenous Every 12 hours 11/03/20 1402 11/05/20 1335   11/03/20 1415  piperacillin-tazobactam (ZOSYN) IVPB 3.375 g  Status:  Discontinued        3.375 g 12.5 mL/hr over 240 Minutes Intravenous Every 8 hours 11/03/20 1401 11/05/20 1337   11/03/20 0645  vancomycin (VANCOCIN) IVPB 1000 mg/200 mL premix        1,000 mg 200 mL/hr over 60 Minutes Intravenous  Once 11/03/20 0631 11/03/20 1147   11/03/20 0600  piperacillin-tazobactam (ZOSYN) IVPB 4.5 g  Status:  Discontinued        4.5 g 200 mL/hr over 30 Minutes Intravenous  Once 11/03/20 0546 11/03/20 0547   11/03/20 0600  piperacillin-tazobactam (ZOSYN) IVPB 3.375 g        3.375 g 100 mL/hr over 30 Minutes Intravenous To Emergency Dept 11/03/20 0548 11/03/20 0650       I have personally reviewed the following labs and images: CBC: Recent Labs  Lab 12/10/20 0120  12/11/20 0430 12/12/20 0300 12/13/20 0319 12/14/20 0530  WBC 18.0* 17.7* 17.7* 18.2* 16.7*  HGB 8.0* 7.7* 7.7* 7.8* 7.5*  HCT 27.3* 25.6* 25.3* 26.5* 25.0*  MCV 91.9 90.5 90.0 89.8 90.6  PLT 372 373 375 402* 379   BMP &GFR Recent Labs  Lab 12/09/20 0524 12/10/20 0120 12/11/20 0430 12/12/20 0300 12/13/20 0319 12/14/20 0530  NA 135 138 138 136 136 137  K 3.3* 4.0 3.9 3.8 3.9 3.8  CL 107 109 109 107 107 108  CO2 23 21* 22 22 20* 21*  GLUCOSE 98 90 100* 94 95 89  BUN 13 15 14 13 15 14   CREATININE 1.02 1.12 1.14 1.12 1.22 0.79  CALCIUM 12.1* 12.2* 12.3* 12.0* 11.9* 11.7*  MG 1.7 1.7 1.5* 2.0 2.0  --    Estimated Creatinine Clearance: 70.6 mL/min (by C-G formula based on SCr of 0.79 mg/dL). Liver & Pancreas: Recent Labs  Lab 12/14/20 0530  ALBUMIN 2.6*   No results for input(s): LIPASE, AMYLASE in the last 168 hours. No results for input(s): AMMONIA in the last 168 hours. Diabetic: No results for input(s): HGBA1C in the last 72 hours. Recent Labs  Lab 12/10/20 0520 12/11/20 0500 12/12/20 0644 12/13/20 0509 12/14/20 0519  GLUCAP 92 90 87 76 83   Cardiac Enzymes: No results for input(s): CKTOTAL, CKMB, CKMBINDEX, TROPONINI in the last 168 hours. No results for input(s): PROBNP in the last 8760 hours. Coagulation Profile: No results for input(s): INR, PROTIME in the last 168 hours. Thyroid Function Tests: No results for input(s): TSH, T4TOTAL, FREET4, T3FREE, THYROIDAB in the last 72 hours. Lipid Profile: No results for input(s): CHOL, HDL, LDLCALC, TRIG, CHOLHDL, LDLDIRECT in the last 72 hours. Anemia Panel: No results for input(s): VITAMINB12, FOLATE, FERRITIN, TIBC, IRON, RETICCTPCT in the last 72 hours. Urine analysis:    Component Value Date/Time   COLORURINE YELLOW 11/22/2020 0204   APPEARANCEUR CLOUDY (A) 11/22/2020 0204   LABSPEC 1.009 11/22/2020 0204   PHURINE 7.0 11/22/2020 0204   GLUCOSEU NEGATIVE 11/22/2020 0204   HGBUR LARGE (A) 11/22/2020 0204    BILIRUBINUR NEGATIVE 11/22/2020 0204   KETONESUR NEGATIVE 11/22/2020 0204   PROTEINUR 100 (A) 11/22/2020 0204   NITRITE NEGATIVE 11/22/2020 0204   LEUKOCYTESUR MODERATE (A) 11/22/2020 0204   Sepsis Labs:  Invalid input(s): PROCALCITONIN, [place]  Microbiology: No results found for this or any previous visit (from the past 240 hour(s)).  Radiology Studies: No results found.    Jamie Wilson T. [place]  If 7PM-7AM, please contact night-coverage [email or web address] 12/14/2020, 1:40 PM

## 2020-12-15 LAB — GLUCOSE, CAPILLARY: Glucose-Capillary: 87 mg/dL (ref 70–99)

## 2020-12-15 NOTE — Progress Notes (Signed)
PROGRESS NOTE  Jamie Wilson WFU:[id number] DOB: 07-Aug-1956   PCP: Patient, [name]  Patient is from: Home  DOA: 11/02/2020 LOS: 41  Chief complaints: Abdominal pain  Brief Narrative / Interim history: 65 y.o.malewith PMH ofbladder CA w/ chronic foley placement; bowel obstruction s/p partial bowel resection w/ colostomy. Patient presented secondary to abdominal pain and found to have evidence of sepsis possibly secondary to necrotic bladder mass. Empiric antibiotics initiated. Urology and oncology consulted aand recommendations given. Hospital course complicated by hypercalcemia,.. Bone scan does not show any lytic or blastic bone lesions suspicious for bone mets. AKI resolved.  Still with some degree of hypercalcemia.  He also had some GI symptoms including nausea, emesis and abdominal distention.  KUB on 1/19 raise concern for SBO but GI symptoms resolved the next day.  Hospitalization further complicated by ESBL bacteremia for which he completed IV meropenem for 10 days.   Subjective: Seen and examined earlier this morning. Reportedly had an episode of hallucination last night.  No complaint this morning.  No audiovisual hallucination this morning.  Objective: Vitals:   12/14/20 1330 12/14/20 2122 12/15/20 0523 12/15/20 1400  BP: 108/64 102/62 (!) 101/59 101/72  Pulse: 75 (!) 106 (!) 110 (!) 110  Resp: 18 16 17 17   Temp: 97.8 F (36.6 C) 98.1 F (36.7 C) 98 F (36.7 C) 97.9 F (36.6 C)  TempSrc: Oral Oral Oral Oral  SpO2: 94% 95% 94% 95%  Weight:      Height:        Intake/Output Summary (Last 24 hours) at 12/15/2020 1427 Last data filed at 12/15/2020 1237 Gross per 24 hour  Intake 2132.53 ml  Output 2725 ml  Net -592.47 ml   Filed Weights   11/23/20 1017 11/24/20 0508 12/05/20 0644  Weight: 59.9 kg 58.1 kg 54.2 kg    Examination:  GENERAL: Frail looking elderly male.  No apparent distress. HEENT: MMM.  Vision and hearing grossly intact.  NECK: Supple.  No  apparent JVD.  RESP: On RA.  No IWOB.  Fair aeration bilaterally. CVS:  RRR. Heart sounds normal.  ABD/GI/GU: BS+. Abd soft, NTND.  Ostomy bag with soft stool.  Foley in place.  Clear urine. MSK/EXT:  Moves extremities. No apparent deformity. No edema.  SKIN: no apparent skin lesion or wound NEURO: Awake, alert and oriented appropriately.  No apparent focal neuro deficit. PSYCH: Calm. Normal affect.  Procedures:  None  Microbiology summarized: 1/6-influenza and COVID-19 PCR nonreactive. 1/6-urine culture with multiple species 1/6-blood culture NGTD. 1/7-urine cultures with multiple species.  Assessment & Plan: Hypercalcemia of malignancy: Calcium > 15 on admit>>> 11.7 (corrects to 12.8).  This is probably not going to resolve completely due to underlying malignancy. -Received zoledronic acid x2 -Continue IV NS with IV Lasix.  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 Jeffie Pollock -Follow-up with oncology, Dr. Irene Limbo  Sepsis secondary to E. coli UTI: POA ESBL bacteremia  -likely from underlying urothelial carcinoma of bladder with chronic indwelling chronic Foley -Completed 2 weeks of Cipro for E. coli UTI per urology recommendation  -Completed 10 days of IV meropenem for ESBL bacteremia  Debility/physical deconditioning/increased risk of fall-noncompliance.  -Therapy recommended SNF for ongoing rehabilitation  Colostomy status Constipation-resolved. -Bowel regimen and colostomy care  AKI: Baseline Cr 0.8.  Resolved. -IV fluid as above  Anemia of chronic disease: Hgb  relatively stable.  Anemia panel consistent with ACD Recent Labs    12/05/20 0320 12/06/20 0513 12/07/20 0418 12/08/20 0323 12/09/20 0524 12/10/20 0120 12/11/20 0430 12/12/20 0300 12/13/20 0319 12/14/20 0530  HGB 8.5* 8.3*  7.3* 7.2* 7.5* 8.0* 7.7* 7.7* 7.8* 7.5*  -Monitor intermittently  Tobacco use disorder: -Nicotine patch  Severe protein calorie malnutrition Body mass index is 15.76 kg/m. Nutrition Problem: Severe Malnutrition Etiology: chronic illness,cancer and cancer related treatments Signs/Symptoms: severe fat depletion,severe muscle depletion,percent weight loss Interventions: Ensure Enlive (each supplement provides 350kcal and 20 grams of protein),MVI,Prostat    Wt Readings from Last 10 Encounters:  12/05/20 54.2 kg  10/12/20 58.1 kg  08/28/20 61.1 kg  07/20/20 68 kg  06/26/20 68 kg  04/03/20 72.6 kg  10/17/19 72.6 kg   DVT prophylaxis:  SCDs Start: 11/03/20 2329  Code Status: Full code Family Communication: Patient and/or RN. Available if any question.  Status is: Inpatient  Remains inpatient appropriate because:Persistent severe electrolyte disturbances, Unsafe d/c plan, IV treatments appropriate due to intensity of illness or inability to take PO and Inpatient level of care appropriate due to severity of illness   Dispo: The patient is from: Home              Anticipated d/c is to: SNF              Anticipated d/c date is: 2 days               Patient currently is not medically stable to d/c.    Difficult to place patient: Yes       Consultants:  Urology-resolved Oncology-resolved   Sch Meds:  Scheduled Meds: . (feeding supplement) PROSource Plus  30 mL Oral Daily  . B-complex with vitamin C  1 tablet Oral Daily  . Chlorhexidine Gluconate Cloth  6 each Topical Daily  . dronabinol  2.5 mg Oral BID AC  . feeding supplement  1 Container Oral TID BM  . feeding supplement  237 mL Oral BID BM  . folic acid  1 mg Oral Daily  . furosemide  40 mg Intravenous Daily  . hydrocerin   Topical BID  . nicotine  14 mg Transdermal Daily  . polyethylene glycol  17 g Oral BID  . senna-docusate  1 tablet Oral QHS  . tamsulosin  0.4 mg Oral Nightly  . cyanocobalamin  1,000 mcg  Oral Daily   Continuous Infusions: . 0.9 % NaCl with KCl 20 mEq / L 100 mL/hr at 12/15/20 0839   PRN Meds:.acetaminophen (TYLENOL) oral liquid 160 mg/5 mL, hydrOXYzine, ondansetron **OR** ondansetron (ZOFRAN) IV, oxyCODONE, sodium chloride flush, sodium chloride flush, traMADol  Antimicrobials: Anti-infectives (From admission, onward)   Start     Dose/Rate Route Frequency Ordered Stop   11/23/20 1000  meropenem (MERREM) 1 g in sodium chloride 0.9 % 100 mL IVPB        1 g 200 mL/hr over 30 Minutes Intravenous Every 8 hours 11/23/20 0937 12/03/20 1857   11/12/20 2000  ciprofloxacin (CIPRO) tablet 500 mg        500 mg Oral 2 times daily 11/12/20 1543 11/17/20 2359   11/10/20 0800  ciprofloxacin (CIPRO) tablet 500 mg  Status:  Discontinued        500 mg Oral Daily with breakfast 11/09/20 1357 11/12/20 1543   11/06/20 1200  ciprofloxacin (CIPRO) tablet 500 mg  Status:  Discontinued        500 mg Oral 2 times daily 11/06/20  1114 11/09/20 1357   11/05/20 1500  cefdinir (OMNICEF) capsule 300 mg  Status:  Discontinued        300 mg Oral Every 12 hours 11/05/20 1337 11/06/20 1114   11/03/20 2200  vancomycin (VANCOREADY) IVPB 500 mg/100 mL  Status:  Discontinued        500 mg 100 mL/hr over 60 Minutes Intravenous Every 12 hours 11/03/20 1402 11/05/20 1335   11/03/20 1415  piperacillin-tazobactam (ZOSYN) IVPB 3.375 g  Status:  Discontinued        3.375 g 12.5 mL/hr over 240 Minutes Intravenous Every 8 hours 11/03/20 1401 11/05/20 1337   11/03/20 0645  vancomycin (VANCOCIN) IVPB 1000 mg/200 mL premix        1,000 mg 200 mL/hr over 60 Minutes Intravenous  Once 11/03/20 0631 11/03/20 1147   11/03/20 0600  piperacillin-tazobactam (ZOSYN) IVPB 4.5 g  Status:  Discontinued        4.5 g 200 mL/hr over 30 Minutes Intravenous  Once 11/03/20 0546 11/03/20 0547   11/03/20 0600  piperacillin-tazobactam (ZOSYN) IVPB 3.375 g        3.375 g 100 mL/hr over 30 Minutes Intravenous To Emergency Dept 11/03/20  0548 11/03/20 0650       I have personally reviewed the following labs and images: CBC: Recent Labs  Lab 12/10/20 0120 12/11/20 0430 12/12/20 0300 12/13/20 0319 12/14/20 0530  WBC 18.0* 17.7* 17.7* 18.2* 16.7*  HGB 8.0* 7.7* 7.7* 7.8* 7.5*  HCT 27.3* 25.6* 25.3* 26.5* 25.0*  MCV 91.9 90.5 90.0 89.8 90.6  PLT 372 373 375 402* 379   BMP &GFR Recent Labs  Lab 12/09/20 0524 12/10/20 0120 12/11/20 0430 12/12/20 0300 12/13/20 0319 12/14/20 0530  NA 135 138 138 136 136 137  K 3.3* 4.0 3.9 3.8 3.9 3.8  CL 107 109 109 107 107 108  CO2 23 21* 22 22 20* 21*  GLUCOSE 98 90 100* 94 95 89  BUN 13 15 14 13 15 14   CREATININE 1.02 1.12 1.14 1.12 1.22 0.79  CALCIUM 12.1* 12.2* 12.3* 12.0* 11.9* 11.7*  MG 1.7 1.7 1.5* 2.0 2.0  --    Estimated Creatinine Clearance: 70.6 mL/min (by C-G formula based on SCr of 0.79 mg/dL). Liver & Pancreas: Recent Labs  Lab 12/14/20 0530  ALBUMIN 2.6*   No results for input(s): LIPASE, AMYLASE in the last 168 hours. No results for input(s): AMMONIA in the last 168 hours. Diabetic: No results for input(s): HGBA1C in the last 72 hours. Recent Labs  Lab 12/11/20 0500 12/12/20 0644 12/13/20 0509 12/14/20 0519 12/15/20 0515  GLUCAP 90 87 76 83 87   Cardiac Enzymes: No results for input(s): CKTOTAL, CKMB, CKMBINDEX, TROPONINI in the last 168 hours. No results for input(s): PROBNP in the last 8760 hours. Coagulation Profile: No results for input(s): INR, PROTIME in the last 168 hours. Thyroid Function Tests: No results for input(s): TSH, T4TOTAL, FREET4, T3FREE, THYROIDAB in the last 72 hours. Lipid Profile: No results for input(s): CHOL, HDL, LDLCALC, TRIG, CHOLHDL, LDLDIRECT in the last 72 hours. Anemia Panel: No results for input(s): VITAMINB12, FOLATE, FERRITIN, TIBC, IRON, RETICCTPCT in the last 72 hours. Urine analysis:    Component Value Date/Time   COLORURINE YELLOW 11/22/2020 0204   APPEARANCEUR CLOUDY (A) 11/22/2020 0204    LABSPEC 1.009 11/22/2020 0204   PHURINE 7.0 11/22/2020 0204   GLUCOSEU NEGATIVE 11/22/2020 0204   HGBUR LARGE (A) 11/22/2020 0204   BILIRUBINUR NEGATIVE 11/22/2020 0204   KETONESUR NEGATIVE 11/22/2020 0204  PROTEINUR 100 (A) 11/22/2020 0204   NITRITE NEGATIVE 11/22/2020 0204   LEUKOCYTESUR MODERATE (A) 11/22/2020 0204   Sepsis Labs: Invalid input(s): PROCALCITONIN, [place]  Microbiology: No results found for this or any previous visit (from the past 240 hour(s)).  Radiology Studies: No results found.    Braylinn Gulden T. [place]  If 7PM-7AM, please contact night-coverage [email or web address] 12/15/2020, 2:27 PM

## 2020-12-16 DIAGNOSIS — I499 Cardiac arrhythmia, unspecified: Secondary | ICD-10-CM

## 2020-12-16 DIAGNOSIS — I951 Orthostatic hypotension: Secondary | ICD-10-CM

## 2020-12-16 DIAGNOSIS — E876 Hypokalemia: Secondary | ICD-10-CM

## 2020-12-16 LAB — CBC
HCT: 25 % — ABNORMAL LOW (ref 39.0–52.0)
Hemoglobin: 7.4 g/dL — ABNORMAL LOW (ref 13.0–17.0)
MCH: 27 pg (ref 26.0–34.0)
MCHC: 29.6 g/dL — ABNORMAL LOW (ref 30.0–36.0)
MCV: 91.2 fL (ref 80.0–100.0)
Platelets: 378 10*3/uL (ref 150–400)
RBC: 2.74 MIL/uL — ABNORMAL LOW (ref 4.22–5.81)
RDW: 15.9 % — ABNORMAL HIGH (ref 11.5–15.5)
WBC: 15.9 10*3/uL — ABNORMAL HIGH (ref 4.0–10.5)
nRBC: 0 % (ref 0.0–0.2)

## 2020-12-16 LAB — RENAL FUNCTION PANEL
Albumin: 2.6 g/dL — ABNORMAL LOW (ref 3.5–5.0)
Anion gap: 7 (ref 5–15)
BUN: 13 mg/dL (ref 8–23)
CO2: 23 mmol/L (ref 22–32)
Calcium: 11.7 mg/dL — ABNORMAL HIGH (ref 8.9–10.3)
Chloride: 107 mmol/L (ref 98–111)
Creatinine, Ser: 1.02 mg/dL (ref 0.61–1.24)
GFR, Estimated: >60 mL/min (ref >60)
Glucose, Bld: 87 mg/dL (ref 70–99)
Phosphorus: 1.9 mg/dL — ABNORMAL LOW (ref 2.5–4.6)
Potassium: 3.7 mmol/L (ref 3.5–5.1)
Sodium: 137 mmol/L (ref 135–145)

## 2020-12-16 LAB — MAGNESIUM: Magnesium: 1.7 mg/dL (ref 1.7–2.4)

## 2020-12-16 LAB — GLUCOSE, CAPILLARY
Glucose-Capillary: 93 mg/dL (ref 70–99)
Glucose-Capillary: 102 mg/dL — ABNORMAL HIGH (ref 70–99)

## 2020-12-16 MED ORDER — POTASSIUM PHOSPHATES 15 MMOLE/5ML IV SOLN
30.0000 mmol | Freq: Once | INTRAVENOUS | Status: AC
  Administered 2020-12-16: 30 mmol via INTRAVENOUS
  Filled 2020-12-16: qty 10

## 2020-12-16 MED ORDER — SODIUM CHLORIDE 0.9 % IV BOLUS
500.0000 mL | Freq: Once | INTRAVENOUS | Status: AC
  Administered 2020-12-16: 500 mL via INTRAVENOUS

## 2020-12-16 MED ORDER — MAGNESIUM SULFATE IN D5W 1-5 GM/100ML-% IV SOLN
1.0000 g | Freq: Once | INTRAVENOUS | Status: AC
  Administered 2020-12-16: 1 g via INTRAVENOUS
  Filled 2020-12-16: qty 100

## 2020-12-16 NOTE — Progress Notes (Signed)
   12/16/20 1323  MEWS Score  Pulse Rate (!) 133  BP 92/72  MEWS RR 0  MEWS Pulse 3  MEWS Systolic 1  MEWS LOC 0  MEWS Temp 0  MEWS Score 4  MEWS Score Color Red  MEWS ECG HR/ Pulse 133   Pt was working with PT who recorded these vital signs. He sat at edge of bed for 8 minutes wherein PT obtained BP of 69/46 with HR of 77. PT moved patient back to bed and placed him in trendelenberg. He was alert and oriented with mild nausea and dizziness while working with PT. A second BP was taken by PT once patient was supine and BP was 92/72 with HR pf 133. After 3 minutes supine the following BP was 117/69 and HR continued to be sustain at 124. After 10 additional minutes of resting in bed, BP was 107/67 and HR sustained at 123. MD made aware via Amion. Pt is currently resting in bed in no apparent distress. He denies N/V, chest pain, dizziness at this time. He nto diaphoretic and has no acute physical complaints at this time.

## 2020-12-16 NOTE — Progress Notes (Signed)
   12/16/20 1315  MEWS Score  Pulse Rate 77  BP (!) 69/46  MEWS RR 0  MEWS Pulse 0  MEWS Systolic 3  MEWS LOC 0  MEWS Temp 0  MEWS Score 3  MEWS Score Color Yellow  MEWS ECG HR/ Pulse 77  Pt was working with PT who recorded these vital signs. He sat at edge of bed for 8 minutes wherein PT obtained BP of 69/46 with HR of 77. PT moved patient back to bed and placed him in trendelenberg. He was alert and oriented with mild nausea and dizziness while working with PT. A second BP was taken by PT once patient was supine and BP was 92/72 with HR pf 133. After 3 minutes supine the following BP was 117/69 and HR continued to be sustain at 124. After 10 additional minutes of resting in bed, BP was 107/67 and HR sustained at 123. MD made aware via Amion. Pt is currently resting in bed in no apparent distress. He denies N/V, chest pain, dizziness at this time. He nto diaphoretic and has no acute physical complaints at this time.

## 2020-12-16 NOTE — Progress Notes (Signed)
Pt transferred as per MD order to progressive care unit [facility] RM 44. Pt stable for transport. Full report given and pt escorted to room with help of PCT and RN with belongings including dentures and Iphone.

## 2020-12-16 NOTE — Progress Notes (Signed)
Paged by patient's RN with concern about his hypotension and heart rate after working with physical therapy.  Blood pressure dropped to 69/46 after sitting on the edge of the bed for about 8 minutes.  HR was in 70s.  Blood pressure improved to 107/67 after resting in bed for about 10 minutes.  However, HR in 120s.  When I arrived, patient was lying in bed with RN and other staff at bedside.  He reports feeling lightheaded earlier when he worked with therapy but improved.  He is not feeling lightheaded now.  He denies chest pain or dyspnea.  He denies GI symptoms.  Does not appear to be in distress.  Objective Vitals:   12/16/20 1331 12/16/20 1339 12/16/20 1351 12/16/20 1427  BP: 117/69 107/67 91/60   Pulse: (!) 124 (!) 118 (!) 40 (!) 112  Resp:      Temp:      TempSrc:      SpO2:      Weight:      Height:       Physical exam Frail looking.  No apparent distress. No respiratory distress or increased work of breathing.  Normal saturation on RA. Bradycardic to 40s.  Irregular at times.  Good bilateral radial pulses.  No JVD. Awake and alert.  No focal neuro deficits.  Assessment and plan Orthostatic hypotension-positive orthostatic vitals while working with PT.  No chest pain or dyspnea.  Also has some tachybradycardia syndrome with PACs.  No acute ischemic finding on EKG. -Normal saline bolus 500 cc x 1 -Continue IV NS at 100 cc after bolus. -Discontinue IV Lasix  Tachybradycardia syndrome?  HR fluctuating from 40s to 130s.  Has some PACs as well.  Morning labs reviewed.  K3.7.  Mg 1.7.  Phosphorus 1.9. -Has already ordered potassium phosphate 30 mmol x 1 this morning -IV magnesium sulfate 1 g x 1   Disposition -Will transfer to telemetry if no improvement with the above interventions. -Given his underlying condition including urologic malignancy, fragility and poor prognosis, patient is not a candidate for pacemaker.

## 2020-12-16 NOTE — Progress Notes (Signed)
PROGRESS NOTE  Jamie Wilson WUJ:[id number] DOB: [birth year]   PCP: Patient, [name]  Patient is from: Home  DOA: 11/02/2020 LOS: 23  Chief complaints: Abdominal pain  Brief Narrative / Interim history: 65 y.o.malewith PMH ofbladder CA w/ chronic foley placement; bowel obstruction s/p partial bowel resection w/ colostomy. Patient presented secondary to abdominal pain and found to have evidence of sepsis possibly secondary to necrotic bladder mass. Empiric antibiotics initiated. Urology and oncology consulted aand recommendations given. Hospital course complicated by hypercalcemia,.. Bone scan does not show any lytic or blastic bone lesions suspicious for bone mets. AKI resolved.  Still with some degree of hypercalcemia.  He also had some GI symptoms including nausea, emesis and abdominal distention.  KUB on 1/19 raise concern for SBO but GI symptoms resolved the next day.  Hospitalization further complicated by ESBL bacteremia for which he completed IV meropenem for 10 days.   Subjective: Seen and examined earlier this morning. No major events overnight of this morning. He was just waking up. No complaints.  Objective: Vitals:   12/15/20 2144 12/15/20 2217 12/16/20 0159 12/16/20 0458  BP: 97/60 123/72 112/67 134/70  Pulse: (!) 104 (!) 102 (!) 105 (!) 102  Resp: 14  16 18   Temp: 98.3 F (36.8 C)  97.9 F (36.6 C) 97.6 F (36.4 C)  TempSrc: Oral  Oral Oral  SpO2: 97% 96% 97%   Weight:      Height:        Intake/Output Summary (Last 24 hours) at 12/16/2020 1147 Last data filed at 12/16/2020 1000 Gross per 24 hour  Intake 1779.81 ml  Output 2450 ml  Net -670.19 ml   Filed Weights   11/23/20 1017 11/24/20 0508 12/05/20 0644  Weight: 59.9 kg 58.1 kg 54.2 kg    Examination:  GENERAL: Frail looking elderly male. No apparent distress. HEENT: MMM.  Vision and hearing grossly intact.  NECK: Supple.  No apparent JVD.  RESP:  No IWOB.  Fair aeration bilaterally. CVS:  RRR.  Heart sounds normal.  ABD/GI/GU: BS+. Abd soft, NTND. Normal-looking ostomy output. Foley in place. MSK/EXT:  Moves extremities. Significant muscle mass and subcu fat loss. SKIN: no apparent skin lesion or wound NEURO: Awake, alert and oriented appropriately.  No apparent focal neuro deficit. PSYCH: Calm. Somewhat flat affect.  Procedures:  None  Microbiology summarized: 1/6-influenza and COVID-19 PCR nonreactive. 1/6-urine culture with multiple species 1/6-blood culture NGTD. 1/7-urine cultures with multiple species.  Assessment & Plan: Hypercalcemia of malignancy: Calcium > 15 on admit>>> 11.7 (corrects to 12.8).  This is probably not going to resolve completely due to underlying malignancy. Patient has about 3 L UOP on daily basis. -Received zoledronic acid x2 -Continue IV NS with IV Lasix.  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 Jeffie Pollock -Follow-up with oncology, Dr. Irene Limbo  Sepsis secondary to E. coli UTI: POA ESBL bacteremia  -likely from underlying urothelial carcinoma of bladder with chronic indwelling chronic Foley -Completed 2 weeks of Cipro for E. coli UTI per urology recommendation  -Completed 10 days of IV meropenem for ESBL bacteremia  Debility/physical deconditioning/increased risk of fall-noncompliance.  -Therapy recommended SNF for ongoing rehabilitation  Colostomy status Constipation-resolved. -Bowel regimen and colostomy care  AKI: Baseline Cr 0.8.  Resolved. -IV fluid as above  Anemia of chronic disease: Hgb relatively stable.  Anemia panel consistent  with ACD Recent Labs    12/06/20 0513 12/07/20 0418 12/08/20 0323 12/09/20 0524 12/10/20 0120 12/11/20 0430 12/12/20 0300 12/13/20 0319 12/14/20 0530 12/16/20 0315  HGB 8.3* 7.3* 7.2* 7.5* 8.0* 7.7* 7.7* 7.8*  7.5* 7.4*  -Monitor intermittently  Tobacco use disorder: -Nicotine patch  Hypokalemia/hypophosphatemia -IV potassium phosphates 30 mmol x 1.  Severe protein calorie malnutrition Body mass index is 15.76 kg/m. Nutrition Problem: Severe Malnutrition Etiology: chronic illness,cancer and cancer related treatments Signs/Symptoms: severe fat depletion,severe muscle depletion,percent weight loss Interventions: Ensure Enlive (each supplement provides 350kcal and 20 grams of protein),MVI,Prostat    Wt Readings from Last 10 Encounters:  12/05/20 54.2 kg  10/12/20 58.1 kg  08/28/20 61.1 kg  07/20/20 68 kg  06/26/20 68 kg  04/03/20 72.6 kg  10/17/19 72.6 kg   DVT prophylaxis:  SCDs Start: 11/03/20 2329  Code Status: Full code Family Communication: Patient and/or RN. Available if any question.  Status is: Inpatient  Remains inpatient appropriate because:Persistent severe electrolyte disturbances, Unsafe d/c plan, IV treatments appropriate due to intensity of illness or inability to take PO and Inpatient level of care appropriate due to severity of illness   Dispo: The patient is from: Home              Anticipated d/c is to: SNF              Anticipated d/c date is: > 3 days               Patient currently is not medically stable to d/c.    Difficult to place patient: Yes       Consultants:  Urology-resolved Oncology-resolved   Sch Meds:  Scheduled Meds: . (feeding supplement) PROSource Plus  30 mL Oral Daily  . B-complex with vitamin C  1 tablet Oral Daily  . Chlorhexidine Gluconate Cloth  6 each Topical Daily  . dronabinol  2.5 mg Oral BID AC  . feeding supplement  1 Container Oral TID BM  . feeding supplement  237 mL Oral BID BM  . folic acid  1 mg Oral Daily  . furosemide  40 mg Intravenous Daily  . hydrocerin   Topical BID  . nicotine  14 mg Transdermal Daily  . polyethylene glycol  17 g Oral BID  . senna-docusate  1 tablet Oral QHS  . tamsulosin  0.4 mg  Oral Nightly  . cyanocobalamin  1,000 mcg Oral Daily   Continuous Infusions: . 0.9 % NaCl with KCl 20 mEq / L 100 mL/hr at 12/15/20 1813  . potassium PHOSPHATE IVPB (in mmol)     PRN Meds:.acetaminophen (TYLENOL) oral liquid 160 mg/5 mL, hydrOXYzine, ondansetron **OR** ondansetron (ZOFRAN) IV, oxyCODONE, sodium chloride flush, sodium chloride flush, traMADol  Antimicrobials: Anti-infectives (From admission, onward)   Start     Dose/Rate Route Frequency Ordered Stop   11/23/20 1000  meropenem (MERREM) 1 g in sodium chloride 0.9 % 100 mL IVPB        1 g 200 mL/hr over 30 Minutes Intravenous Every 8 hours 11/23/20 0937 12/03/20 1857   11/12/20 2000  ciprofloxacin (CIPRO) tablet 500 mg        500 mg Oral 2 times daily 11/12/20 1543 11/17/20 2359   11/10/20 0800  ciprofloxacin (CIPRO) tablet 500 mg  Status:  Discontinued        500 mg Oral Daily with breakfast 11/09/20 1357 11/12/20 1543   11/06/20 1200  ciprofloxacin (CIPRO) tablet 500 mg  Status:  Discontinued  500 mg Oral 2 times daily 11/06/20 1114 11/09/20 1357   11/05/20 1500  cefdinir (OMNICEF) capsule 300 mg  Status:  Discontinued        300 mg Oral Every 12 hours 11/05/20 1337 11/06/20 1114   11/03/20 2200  vancomycin (VANCOREADY) IVPB 500 mg/100 mL  Status:  Discontinued        500 mg 100 mL/hr over 60 Minutes Intravenous Every 12 hours 11/03/20 1402 11/05/20 1335   11/03/20 1415  piperacillin-tazobactam (ZOSYN) IVPB 3.375 g  Status:  Discontinued        3.375 g 12.5 mL/hr over 240 Minutes Intravenous Every 8 hours 11/03/20 1401 11/05/20 1337   11/03/20 0645  vancomycin (VANCOCIN) IVPB 1000 mg/200 mL premix        1,000 mg 200 mL/hr over 60 Minutes Intravenous  Once 11/03/20 0631 11/03/20 1147   11/03/20 0600  piperacillin-tazobactam (ZOSYN) IVPB 4.5 g  Status:  Discontinued        4.5 g 200 mL/hr over 30 Minutes Intravenous  Once 11/03/20 0546 11/03/20 0547   11/03/20 0600  piperacillin-tazobactam (ZOSYN) IVPB 3.375 g         3.375 g 100 mL/hr over 30 Minutes Intravenous To Emergency Dept 11/03/20 0548 11/03/20 0650       I have personally reviewed the following labs and images: CBC: Recent Labs  Lab 12/11/20 0430 12/12/20 0300 12/13/20 0319 12/14/20 0530 12/16/20 0315  WBC 17.7* 17.7* 18.2* 16.7* 15.9*  HGB 7.7* 7.7* 7.8* 7.5* 7.4*  HCT 25.6* 25.3* 26.5* 25.0* 25.0*  MCV 90.5 90.0 89.8 90.6 91.2  PLT 373 375 402* 379 378   BMP &GFR Recent Labs  Lab 12/10/20 0120 12/11/20 0430 12/12/20 0300 12/13/20 0319 12/14/20 0530 12/16/20 0315  NA 138 138 136 136 137 137  K 4.0 3.9 3.8 3.9 3.8 3.7  CL 109 109 107 107 108 107  CO2 21* 22 22 20* 21* 23  GLUCOSE 90 100* 94 95 89 87  BUN 15 14 13 15 14 13   CREATININE 1.12 1.14 1.12 1.22 0.79 1.02  CALCIUM 12.2* 12.3* 12.0* 11.9* 11.7* 11.7*  MG 1.7 1.5* 2.0 2.0  --  1.7  PHOS  --   --   --   --   --  1.9*   Estimated Creatinine Clearance: 55.4 mL/min (by C-G formula based on SCr of 1.02 mg/dL). Liver & Pancreas: Recent Labs  Lab 12/14/20 0530 12/16/20 0315  ALBUMIN 2.6* 2.6*   No results for input(s): LIPASE, AMYLASE in the last 168 hours. No results for input(s): AMMONIA in the last 168 hours. Diabetic: No results for input(s): HGBA1C in the last 72 hours. Recent Labs  Lab 12/12/20 0644 12/13/20 0509 12/14/20 0519 12/15/20 0515 12/16/20 0445  GLUCAP 87 76 83 87 93   Cardiac Enzymes: No results for input(s): CKTOTAL, CKMB, CKMBINDEX, TROPONINI in the last 168 hours. No results for input(s): PROBNP in the last 8760 hours. Coagulation Profile: No results for input(s): INR, PROTIME in the last 168 hours. Thyroid Function Tests: No results for input(s): TSH, T4TOTAL, FREET4, T3FREE, THYROIDAB in the last 72 hours. Lipid Profile: No results for input(s): CHOL, HDL, LDLCALC, TRIG, CHOLHDL, LDLDIRECT in the last 72 hours. Anemia Panel: No results for input(s): VITAMINB12, FOLATE, FERRITIN, TIBC, IRON, RETICCTPCT in the last 72  hours. Urine analysis:    Component Value Date/Time   COLORURINE YELLOW 11/22/2020 0204   APPEARANCEUR CLOUDY (A) 11/22/2020 0204   LABSPEC 1.009 11/22/2020 0204   PHURINE  7.0 11/22/2020 0204   GLUCOSEU NEGATIVE 11/22/2020 0204   HGBUR LARGE (A) 11/22/2020 0204   BILIRUBINUR NEGATIVE 11/22/2020 [place] 11/22/2020 0204   PROTEINUR 100 (A) 11/22/2020 0204   NITRITE NEGATIVE 11/22/2020 0204   LEUKOCYTESUR MODERATE (A) 11/22/2020 0204   Sepsis Labs: Invalid input(s): PROCALCITONIN, [place]  Microbiology: No results found for this or any previous visit (from the past 240 hour(s)).  Radiology Studies: No results found.    Taye T. [place]  If 7PM-7AM, please contact night-coverage [email or web address] 12/16/2020, 11:47 AM

## 2020-12-16 NOTE — Progress Notes (Signed)
   12/16/20 1331  MEWS Score  Pulse Rate (!) 124  BP 117/69  MEWS RR 0  MEWS Pulse 2  MEWS Systolic 0  MEWS LOC 0  MEWS Temp 0  MEWS Score 2  MEWS Score Color Yellow  MEWS ECG HR/ Pulse 124  Pt was working with PT who recorded these vital signs. He sat at edge of bed for 8 minutes wherein PT obtained BP of 69/46 with HR of 77. PT moved patient back to bed and placed him in trendelenberg. He was alert and oriented with mild nausea and dizziness while working with PT. A second BP was taken by PT once patient was supine and BP was 92/72 with HR pf 133. After 3 minutes supine the following BP was 117/69 and HR continued to be sustain at 124. After 10 additional minutes of resting in bed, BP was 107/67 and HR sustained at 123. MD made aware via Amion. Pt is currently resting in bed in no apparent distress. He denies N/V, chest pain, dizziness at this time. He nto diaphoretic and has no acute physical complaints at this time.

## 2020-12-16 NOTE — Progress Notes (Addendum)
   12/16/20 1351  MEWS Score  Pulse Rate (!) 40  BP 91/60  Level of Consciousness Alert  MEWS RR 0  MEWS Pulse 1  MEWS Systolic 1  MEWS LOC 0  MEWS Temp 0  MEWS Score 2  MEWS Score Color Yellow  MEWS ECG HR/ Pulse 40  MD Gonfa at bedside and 12 lead EKG ordered. Pt stayed asymptomatic and denies chest pain, dizziness, nausea, diaphoresis. MD ordered 500cc NS bolus and bolus started immediately. MD would like new VS taken in 30 minutes and he will reassess at that time.

## 2020-12-16 NOTE — Progress Notes (Signed)
Physical Therapy Treatment Patient Details Name: Jamie Wilson MRN: [id number] DOB: September 19, 1956 Today's Date: 12/16/2020    History of Present Illness 65 y.o. 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 Patient presented secondary to abdominal pain and found to have evidence of sepsis possibly secondary to necrotic bladder mass. Empiric antibiotics initiated. Urology and oncology consulted.    PT Comments    Pt has refused several prior attempts to participate with PHYSICAL THERAPY.  Pt not getting OOB with nurses either. Pt promised me he would try at 1 pm today.  So I came at 1pm. Pt reluctantly agreed to try.  "but I'm not walking".     General bed mobility comments: increased time but pt self able with encouragement.  Nearly 12 min to transfer from supine to EOB.  MAX encouragement.  Once partially upright pt immediately started to c/o dizziness.  Allowed 3 to 4 min to pass and still dizzy.  BP taken was 69/46 with HR 133.  RN called to room.  Assisted back to bed.  BP 92/72   HR 129.  Allowed 2 min to pass still in supine BP 117/69 HR 124.  RN in room. Pt is NOT progressing with his  Mobility.    Follow Up Recommendations  SNF     Equipment Recommendations  None recommended by PT    Recommendations for Other Services       Precautions / Restrictions Precautions Precautions: Fall Precaution Comments: colostomy    Mobility  Bed Mobility Overal bed mobility: Needs Assistance Bed Mobility: Supine to Sit     Supine to sit: Mod assist Sit to supine: Mod assist;Max assist   General bed mobility comments: increased time but pt self able with encouragement.  Nearly 12 min to transfer from supine to EOB.  MAX encouragement.  Once partially upright pt immediately started to c/o dizziness.  Allowed 3 to 4 min to pass and still dizzy.  BP taken was 69/46 with HR 133.  RN called to room.   Assisted back to bed.  BP 92/72   HR 129.  Allowed 2 min to pass still in supine BP 117/69 HR 124.  RN in room.    Transfers                 General transfer comment: unable to attempt any OOB activity due to vitals  Ambulation/Gait                 Stairs             Wheelchair Mobility    Modified Rankin (Stroke Patients Only)       Balance                                            Cognition Arousal/Alertness: Awake/alert Behavior During Therapy: WFL for tasks assessed/performed Overall Cognitive Status: Within Functional Limits for tasks assessed                                 General Comments: AxO x 3 loves to complain about everything and required MAX encouragement to get OOB      Exercises      General Comments        Pertinent Vitals/Pain  Pain Assessment: Faces Pain Location: general Pain Descriptors / Indicators: Discomfort;Grimacing Pain Intervention(s): Monitored during session    Home Living                      Prior Function            PT Goals (current goals can now be found in the care plan section) Progress towards PT goals: Progressing toward goals    Frequency    Min 2X/week      PT Plan Current plan remains appropriate    Co-evaluation              AM-PAC PT "6 Clicks" Mobility   Outcome Measure  Help needed turning from your back to your side while in a flat bed without using bedrails?: A Lot Help needed moving from lying on your back to sitting on the side of a flat bed without using bedrails?: A Lot Help needed moving to and from a bed to a chair (including a wheelchair)?: A Lot Help needed standing up from a chair using your arms (e.g., wheelchair or bedside chair)?: A Lot Help needed to walk in hospital room?: Total Help needed climbing 3-5 steps with a railing? : Total 6 Click Score: 10    End of Session Equipment Utilized During Treatment: Gait  belt Activity Tolerance: Other (comment) (elevated HR with hypotension) Patient left: in bed;with bed alarm set;with nursing/sitter in room;with call bell/phone within reach Nurse Communication: Mobility status PT Visit Diagnosis: Other abnormalities of gait and mobility (R26.89);Muscle weakness (generalized) (M62.81)     Time: 8341-9622 PT Time Calculation (min) (ACUTE ONLY): 30 min  Charges:  $Therapeutic Activity: 23-37 mins                     Rica Koyanagi  PTA Acute  Rehabilitation Services Pager      [phone] Office      [phone]

## 2020-12-17 DIAGNOSIS — I495 Sick sinus syndrome: Secondary | ICD-10-CM

## 2020-12-17 DIAGNOSIS — R55 Syncope and collapse: Secondary | ICD-10-CM

## 2020-12-17 LAB — GLUCOSE, CAPILLARY
Glucose-Capillary: 80 mg/dL (ref 70–99)
Glucose-Capillary: 94 mg/dL (ref 70–99)
Glucose-Capillary: 94 mg/dL (ref 70–99)

## 2020-12-17 LAB — PREPARE RBC (CROSSMATCH)

## 2020-12-17 LAB — RENAL FUNCTION PANEL
Albumin: 2.5 g/dL — ABNORMAL LOW (ref 3.5–5.0)
Anion gap: 7 (ref 5–15)
BUN: 11 mg/dL (ref 8–23)
CO2: 21 mmol/L — ABNORMAL LOW (ref 22–32)
Calcium: 11.6 mg/dL — ABNORMAL HIGH (ref 8.9–10.3)
Chloride: 107 mmol/L (ref 98–111)
Creatinine, Ser: 0.88 mg/dL (ref 0.61–1.24)
GFR, Estimated: >60 mL/min (ref >60)
Glucose, Bld: 89 mg/dL (ref 70–99)
Phosphorus: 2.6 mg/dL (ref 2.5–4.6)
Potassium: 3.8 mmol/L (ref 3.5–5.1)
Sodium: 135 mmol/L (ref 135–145)

## 2020-12-17 LAB — T4, FREE: Free T4: 0.71 ng/dL (ref 0.61–1.12)

## 2020-12-17 LAB — MAGNESIUM: Magnesium: 1.7 mg/dL (ref 1.7–2.4)

## 2020-12-17 LAB — TSH: TSH: 1.866 u[IU]/mL (ref 0.350–4.500)

## 2020-12-17 MED ORDER — SODIUM CHLORIDE 0.9% IV SOLUTION: Freq: Once | INTRAVENOUS | Status: AC

## 2020-12-17 MED ORDER — MAGNESIUM SULFATE 2 GM/50ML IV SOLN
2.0000 g | Freq: Once | INTRAVENOUS | Status: AC
  Administered 2020-12-17: 2 g via INTRAVENOUS
  Filled 2020-12-17: qty 50

## 2020-12-17 MED ORDER — POTASSIUM CHLORIDE 20 MEQ PO PACK
40.0000 meq | PACK | Freq: Once | ORAL | Status: AC
  Administered 2020-12-18: 40 meq via ORAL
  Filled 2020-12-17 (×2): qty 2

## 2020-12-17 MED ORDER — ZOLEDRONIC ACID 5 MG/100ML IV SOLN
5.0000 mg | Freq: Once | INTRAVENOUS | Status: AC
  Administered 2020-12-17: 5 mg via INTRAVENOUS
  Filled 2020-12-17: qty 100

## 2020-12-17 NOTE — TOC Progression Note (Signed)
Transition of Care [facility]) - Progression Note    Patient Details  Name: Jamie Wilson MRN: [id number] Date of Birth: Oct 04, 1956  Transition of Care [facility]) CM/SW [place], [place] Phone Number: 12/17/2020, 9:34 AM  Clinical Narrative:    CSW and physician discussed patient current medical status. Physician inquired if SNF can provide daily IV fluids. [facility] to check if they can provide daily IV fluids. Misty will update on Monday.  TOC staff will continue to follow.   Expected Discharge Plan: Skilled Nursing Facility Barriers to Discharge: Continued Medical Work up  Expected Discharge Plan and Services Expected Discharge Plan: [place]         Expected Discharge Date:  (unknown)                                     Social Determinants of Health (SDOH) Interventions    Readmission Risk Interventions Readmission Risk Prevention Plan 10/04/2020  Transportation Screening Complete  PCP or Specialist Appt within 5-7 Days Complete  Home Care Screening Complete  Medication Review (RN CM) Complete

## 2020-12-17 NOTE — Progress Notes (Signed)
PROGRESS NOTE  Jamie Wilson HER:[id number] DOB: [birth year]   PCP: Patient, [name]  Patient is from: Home  DOA: 11/02/2020 LOS: 68  Chief complaints: Abdominal pain  Brief Narrative / Interim history: 65 y.o.malewith PMH ofbladder CA w/ chronic foley placement; bowel obstruction s/p partial bowel resection w/ colostomy. Patient presented secondary to abdominal pain and found to have evidence of sepsis possibly secondary to necrotic bladder mass. Empiric antibiotics initiated. Urology and oncology consulted aand recommendations given. Hospital course complicated by hypercalcemia,.. Bone scan does not show any lytic or blastic bone lesions suspicious for bone mets. AKI resolved.  Still with some degree of hypercalcemia.  He also had some GI symptoms including nausea, emesis and abdominal distention.  KUB on 1/19 raise concern for SBO but GI symptoms resolved the next day.  Hospitalization further complicated by ESBL bacteremia for which he completed IV meropenem for 10 days.   Subjective: Seen and examined earlier this morning.  Patient had an episode of near syncope and tachybradycardia syndrome yesterday while working with PT likely due to dehydration.  He was transferred to progressive care for telemetry monitoring.  Has not had any further episodes overnight but is slightly tachycardic to low 100s.  Not symptomatic from this.  Objective: Vitals:   12/17/20 0400 12/17/20 0504 12/17/20 1159 12/17/20 1205  BP: 110/62 109/62 100/69   Pulse:  (!) 104 93   Resp: 16  20   Temp:  98.3 F (36.8 C) (!) 96.8 F (36 C) 97.8 F (36.6 C)  TempSrc:  Oral Oral   SpO2:  98% 97%   Weight:  48.5 kg    Height:        Intake/Output Summary (Last 24 hours) at 12/17/2020 1458 Last data filed at 12/17/2020 1300 Gross per 24 hour  Intake 4082.97 ml  Output 1750 ml  Net 2332.97 ml   Filed Weights   11/24/20 0508 12/05/20 0644 12/17/20 0504  Weight: 58.1 kg 54.2 kg 48.5 kg     Examination:  GENERAL: Frail and chronically ill-appearing.  No apparent distress. HEENT: MMM.  Vision and hearing grossly intact.  NECK: Supple.  No apparent JVD.  RESP:  No IWOB.  Fair aeration bilaterally. CVS: HR 100.  Regular rhythm. Heart sounds normal.  ABD/GI/GU: BS+. Abd soft, NTND.  Normal ostomy output.  Foley in place. MSK/EXT:  Moves extremities.  Significant muscle mass and subcu fat loss. SKIN: no apparent skin lesion or wound NEURO: Awake, alert and oriented appropriately.  No apparent focal neuro deficit. PSYCH: Calm. Normal affect.  Procedures:  None  Microbiology summarized: 1/6-influenza and COVID-19 PCR nonreactive. 1/6-urine culture with multiple species 1/6-blood culture NGTD. 1/7-urine cultures with multiple species.  Assessment & Plan: Hypercalcemia of malignancy: Calcium > 15 on admit>>> 11.7 (corrects to 12.8).  This is probably not going to resolve completely due to underlying malignancy. Patient has about 3 L UOP on daily basis. -Received zoledronic acid x2.  Will give additional dose today -Continue IV normal saline.  IV Lasix discontinued due to near syncope and dehydration. -TOC to ask if SNF can do IV normal saline on daily basis  Orthostatic hypotension/near syncope/tachycardia syndrome: Improved.  No further bradycardia overnight.  HR about 105 on bedside monitor.  Appears regular rhythm. -Continue IV fluids -Discontinued IV Lasix yesterday. -Optimize electrolytes.  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 Jeffie Pollock -Follow-up with oncology, Dr. Irene Limbo outpatient  Sepsis secondary to  E. coli UTI: POA ESBL bacteremia  -likely from underlying urothelial carcinoma of bladder with chronic indwelling chronic Foley -Completed 2 weeks of Cipro for E. coli UTI per urology recommendation  -Completed 10 days of IV meropenem  for ESBL bacteremia  Debility/physical deconditioning/increased risk of fall-noncompliance.  -Therapy recommended SNF for ongoing rehabilitation  Colostomy status Constipation-resolved. -Bowel regimen and colostomy care  AKI: Baseline Cr 0.8.  Resolved. -IV fluid as above  Anemia of chronic disease: Hgb relatively stable.  Anemia panel consistent with ACD Recent Labs    12/06/20 0513 12/07/20 0418 12/08/20 0323 12/09/20 0524 12/10/20 0120 12/11/20 0430 12/12/20 0300 12/13/20 0319 12/14/20 0530 12/16/20 0315  HGB 8.3* 7.3* 7.2* 7.5* 8.0* 7.7* 7.7* 7.8* 7.5* 7.4*  -Monitor intermittently -Transfuse 1 unit  Tobacco use disorder: -Nicotine patch  Hypokalemia/hypophosphatemia/hypomagnesemia -K-Dur 40 mEq x 1 -IV magnesium sulfate 2 g x 1  Severe protein calorie malnutrition Body mass index is 14.11 kg/m. Nutrition Problem: Severe Malnutrition Etiology: chronic illness,cancer and cancer related treatments Signs/Symptoms: severe fat depletion,severe muscle depletion,percent weight loss Interventions: Ensure Enlive (each supplement provides 350kcal and 20 grams of protein),MVI,Prostat    Wt Readings from Last 10 Encounters:  12/17/20 48.5 kg  10/12/20 58.1 kg  08/28/20 61.1 kg  07/20/20 68 kg  06/26/20 68 kg  04/03/20 72.6 kg  10/17/19 72.6 kg   DVT prophylaxis:  SCDs Start: 11/03/20 2329  Code Status: Full code Family Communication: Patient and/or RN. Available if any question.  Status is: Inpatient  Remains inpatient appropriate because:Persistent severe electrolyte disturbances, Unsafe d/c plan, IV treatments appropriate due to intensity of illness or inability to take PO and Inpatient level of care appropriate due to severity of illness   Dispo: The patient is from: Home              Anticipated d/c is to: SNF              Anticipated d/c date is: > 3 days               Patient currently is not medically stable to d/c.    Difficult to place patient:  Yes       Consultants:  Urology-resolved Oncology-resolved   Sch Meds:  Scheduled Meds: . (feeding supplement) PROSource Plus  30 mL Oral Daily  . B-complex with vitamin C  1 tablet Oral Daily  . Chlorhexidine Gluconate Cloth  6 each Topical Daily  . dronabinol  2.5 mg Oral BID AC  . feeding supplement  1 Container Oral TID BM  . feeding supplement  237 mL Oral BID BM  . folic acid  1 mg Oral Daily  . hydrocerin   Topical BID  . nicotine  14 mg Transdermal Daily  . polyethylene glycol  17 g Oral BID  . senna-docusate  1 tablet Oral QHS  . tamsulosin  0.4 mg Oral Nightly  . cyanocobalamin  1,000 mcg Oral Daily   Continuous Infusions: . 0.9 % NaCl with KCl 20 mEq / L 100 mL/hr at 12/17/20 0954   PRN Meds:.acetaminophen (TYLENOL) oral liquid 160 mg/5 mL, hydrOXYzine, ondansetron **OR** ondansetron (ZOFRAN) IV, oxyCODONE, sodium chloride flush, sodium chloride flush, traMADol  Antimicrobials: Anti-infectives (From admission, onward)   Start     Dose/Rate Route Frequency Ordered Stop   11/23/20 1000  meropenem (MERREM) 1 g in sodium chloride 0.9 % 100 mL IVPB        1 g 200 mL/hr over 30 Minutes Intravenous Every 8 hours  11/23/20 0937 12/03/20 1857   11/12/20 2000  ciprofloxacin (CIPRO) tablet 500 mg        500 mg Oral 2 times daily 11/12/20 1543 11/17/20 2359   11/10/20 0800  ciprofloxacin (CIPRO) tablet 500 mg  Status:  Discontinued        500 mg Oral Daily with breakfast 11/09/20 1357 11/12/20 1543   11/06/20 1200  ciprofloxacin (CIPRO) tablet 500 mg  Status:  Discontinued        500 mg Oral 2 times daily 11/06/20 1114 11/09/20 1357   11/05/20 1500  cefdinir (OMNICEF) capsule 300 mg  Status:  Discontinued        300 mg Oral Every 12 hours 11/05/20 1337 11/06/20 1114   11/03/20 2200  vancomycin (VANCOREADY) IVPB 500 mg/100 mL  Status:  Discontinued        500 mg 100 mL/hr over 60 Minutes Intravenous Every 12 hours 11/03/20 1402 11/05/20 1335   11/03/20 1415   piperacillin-tazobactam (ZOSYN) IVPB 3.375 g  Status:  Discontinued        3.375 g 12.5 mL/hr over 240 Minutes Intravenous Every 8 hours 11/03/20 1401 11/05/20 1337   11/03/20 0645  vancomycin (VANCOCIN) IVPB 1000 mg/200 mL premix        1,000 mg 200 mL/hr over 60 Minutes Intravenous  Once 11/03/20 0631 11/03/20 1147   11/03/20 0600  piperacillin-tazobactam (ZOSYN) IVPB 4.5 g  Status:  Discontinued        4.5 g 200 mL/hr over 30 Minutes Intravenous  Once 11/03/20 0546 11/03/20 0547   11/03/20 0600  piperacillin-tazobactam (ZOSYN) IVPB 3.375 g        3.375 g 100 mL/hr over 30 Minutes Intravenous To Emergency Dept 11/03/20 0548 11/03/20 0650       I have personally reviewed the following labs and images: CBC: Recent Labs  Lab 12/11/20 0430 12/12/20 0300 12/13/20 0319 12/14/20 0530 12/16/20 0315  WBC 17.7* 17.7* 18.2* 16.7* 15.9*  HGB 7.7* 7.7* 7.8* 7.5* 7.4*  HCT 25.6* 25.3* 26.5* 25.0* 25.0*  MCV 90.5 90.0 89.8 90.6 91.2  PLT 373 375 402* 379 378   BMP &GFR Recent Labs  Lab 12/11/20 0430 12/12/20 0300 12/13/20 0319 12/14/20 0530 12/16/20 0315 12/17/20 0400  NA 138 136 136 137 137 135  K 3.9 3.8 3.9 3.8 3.7 3.8  CL 109 107 107 108 107 107  CO2 22 22 20* 21* 23 21*  GLUCOSE 100* 94 95 89 87 89  BUN 14 13 15 14 13 11   CREATININE 1.14 1.12 1.22 0.79 1.02 0.88  CALCIUM 12.3* 12.0* 11.9* 11.7* 11.7* 11.6*  MG 1.5* 2.0 2.0  --  1.7 1.7  PHOS  --   --   --   --  1.9* 2.6   Estimated Creatinine Clearance: 57.4 mL/min (by C-G formula based on SCr of 0.88 mg/dL). Liver & Pancreas: Recent Labs  Lab 12/14/20 0530 12/16/20 0315 12/17/20 0400  ALBUMIN 2.6* 2.6* 2.5*   No results for input(s): LIPASE, AMYLASE in the last 168 hours. No results for input(s): AMMONIA in the last 168 hours. Diabetic: No results for input(s): HGBA1C in the last 72 hours. Recent Labs  Lab 12/15/20 0515 12/16/20 0445 12/16/20 2157 12/17/20 0742 12/17/20 1137  GLUCAP 87 93 102* 80 94    Cardiac Enzymes: No results for input(s): CKTOTAL, CKMB, CKMBINDEX, TROPONINI in the last 168 hours. No results for input(s): PROBNP in the last 8760 hours. Coagulation Profile: No results for input(s): INR, PROTIME in the  last 168 hours. Thyroid Function Tests: Recent Labs    12/17/20 0400  TSH 1.866  FREET4 0.71   Lipid Profile: No results for input(s): CHOL, HDL, LDLCALC, TRIG, CHOLHDL, LDLDIRECT in the last 72 hours. Anemia Panel: No results for input(s): VITAMINB12, FOLATE, FERRITIN, TIBC, IRON, RETICCTPCT in the last 72 hours. Urine analysis:    Component Value Date/Time   COLORURINE YELLOW 11/22/2020 0204   APPEARANCEUR CLOUDY (A) 11/22/2020 0204   LABSPEC 1.009 11/22/2020 0204   PHURINE 7.0 11/22/2020 0204   GLUCOSEU NEGATIVE 11/22/2020 0204   HGBUR LARGE (A) 11/22/2020 0204   BILIRUBINUR NEGATIVE 11/22/2020 0204   KETONESUR NEGATIVE 11/22/2020 0204   PROTEINUR 100 (A) 11/22/2020 0204   NITRITE NEGATIVE 11/22/2020 0204   LEUKOCYTESUR MODERATE (A) 11/22/2020 0204   Sepsis Labs: Invalid input(s): PROCALCITONIN, [place]  Microbiology: No results found for this or any previous visit (from the past 240 hour(s)).  Radiology Studies: No results found.    Otha Monical T. [place]  If 7PM-7AM, please contact night-coverage [email or web address] 12/17/2020, 2:58 PM

## 2020-12-18 LAB — TYPE AND SCREEN
ABO/RH(D): O POS
Antibody Screen: NEGATIVE
Unit division: 0

## 2020-12-18 LAB — CBC
HCT: 29.7 % — ABNORMAL LOW (ref 39.0–52.0)
Hemoglobin: 9.3 g/dL — ABNORMAL LOW (ref 13.0–17.0)
MCH: 27.6 pg (ref 26.0–34.0)
MCHC: 31.3 g/dL (ref 30.0–36.0)
MCV: 88.1 fL (ref 80.0–100.0)
Platelets: 360 10*3/uL (ref 150–400)
RBC: 3.37 MIL/uL — ABNORMAL LOW (ref 4.22–5.81)
RDW: 15.7 % — ABNORMAL HIGH (ref 11.5–15.5)
WBC: 14.6 10*3/uL — ABNORMAL HIGH (ref 4.0–10.5)
nRBC: 0 % (ref 0.0–0.2)

## 2020-12-18 LAB — BPAM RBC
Blood Product Expiration Date: 202203212359
ISSUE DATE / TIME: 202202191657
Unit Type and Rh: 5100

## 2020-12-18 LAB — RENAL FUNCTION PANEL
Albumin: 2.5 g/dL — ABNORMAL LOW (ref 3.5–5.0)
Anion gap: 6 (ref 5–15)
BUN: 10 mg/dL (ref 8–23)
CO2: 21 mmol/L — ABNORMAL LOW (ref 22–32)
Calcium: 11.4 mg/dL — ABNORMAL HIGH (ref 8.9–10.3)
Chloride: 107 mmol/L (ref 98–111)
Creatinine, Ser: 0.83 mg/dL (ref 0.61–1.24)
GFR, Estimated: >60 mL/min (ref >60)
Glucose, Bld: 91 mg/dL (ref 70–99)
Phosphorus: 2 mg/dL — ABNORMAL LOW (ref 2.5–4.6)
Potassium: 3.9 mmol/L (ref 3.5–5.1)
Sodium: 134 mmol/L — ABNORMAL LOW (ref 135–145)

## 2020-12-18 LAB — MAGNESIUM: Magnesium: 2.3 mg/dL (ref 1.7–2.4)

## 2020-12-18 NOTE — Progress Notes (Signed)
PROGRESS NOTE  Sante Biedermann KGU:[id number] DOB: 06/23/1956   PCP: Patient, [name]  Patient is from: Home  DOA: 11/02/2020 LOS: 23  Chief complaints: Abdominal pain  Brief Narrative / Interim history: 65 y.o.malewith PMH ofbladder CA w/ chronic foley placement; bowel obstruction s/p partial bowel resection w/ colostomy. Patient presented secondary to abdominal pain and found to have evidence of sepsis possibly secondary to UTI in the setting of necrotic bladder mass. Empiric antibiotics initiated. Urology and oncology consulted and recommended outpatient follow-up.  He completed 2 weeks of antibiotics for presumed UTI.  Hospital course complicated by anemia, AKI, persistent hypercalcemia and ESBL bacteremia.  AKI resolved.  Completed 10 days of IV meropenem for ESBL bacteremia.  Anemia relatively stable.  However, prolonged hospitalization due to persistent hypercalcemia despite bisphosphonates and continued IV fluid.  His PTH was appropriately suppressed.  PTHrP low.  Bone scan did not not show any lytic or blastic bone lesions suspicious for bone mets.   Subjective: Seen and examined earlier this morning.  No major events overnight of this morning.  Complains about vital signs check, CBG monitoring... Also asking if his pain medication will be increased for abdominal pain.  However, he understands that opiates could slow his bowel function and cause more problem.  Objective: Vitals:   12/17/20 2002 12/17/20 2153 12/18/20 0400 12/18/20 1204  BP:  125/73  124/71  Pulse:  94 88 (!) 101  Resp: 18 18  18   Temp:  97.9 F (36.6 C) 98 F (36.7 C) (!) 97.2 F (36.2 C)  TempSrc:  Oral Oral Oral  SpO2:  98%  98%  Weight:      Height:        Intake/Output Summary (Last 24 hours) at 12/18/2020 1423 Last data filed at 12/18/2020 1224 Gross per 24 hour  Intake 1818.55 ml  Output 2100 ml  Net -281.45 ml   Filed Weights   11/24/20 0508 12/05/20 0644 12/17/20 0504  Weight: 58.1 kg 54.2 kg  48.5 kg    Examination:  GENERAL: Frail and chronically ill-appearing.  Nontoxic. HEENT: MMM.  Vision and hearing grossly intact.  NECK: Supple.  No apparent JVD.  RESP:  No IWOB.  Fair aeration bilaterally. CVS:  RRR. Heart sounds normal.  ABD/GI/GU: BS+. Abd soft, NTND.  Normal ostomy output.  Foley in place. MSK/EXT:  Moves extremities.  Significant muscle mass and subcu fat loss. SKIN: no apparent skin lesion or wound NEURO: Awake, alert and oriented appropriately.  No apparent focal neuro deficit. PSYCH: Calm.  Flat affect.  Procedures:  None  Microbiology summarized: 1/6-influenza and COVID-19 PCR nonreactive. 1/6-urine culture with multiple species 1/6-blood culture NGTD. 1/7-urine cultures with multiple species.  Assessment & Plan: Persistent hypercalcemia: Calcium > 15 on admit>>> 11.7 (corrects to 12.8).  Thought to be due to underlying malignancy.  PTH and PTHrP low. Patient has about 3 L UOP on daily basis. -Received zoledronic acid x3 -Continue IV normal saline. -TOC to ask if SNF can do IV normal saline on daily basis -Check LDH, ACE, myeloma panel for completeness.  Orthostatic hypotension/near syncope/tachycardia syndrome: Improved.  No further significant arrhythmia.  Regular rhythm on telemetry.  -Continue IV fluids -Optimize electrolytes. 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 Jeffie Pollock -Follow-up with oncology, Dr. Irene Limbo outpatient  Sepsis secondary to E. coli UTI: POA ESBL bacteremia  -likely from underlying urothelial carcinoma of  bladder with chronic indwelling chronic Foley -Completed 2 weeks of Cipro for E. coli UTI per urology recommendation  -Completed 10 days of IV meropenem for ESBL bacteremia  Debility/physical deconditioning/increased risk of fall-noncompliance.   -Therapy recommended SNF for ongoing rehabilitation  Colostomy status Constipation-resolved. -Bowel regimen and colostomy care  AKI: Baseline Cr 0.8.  Resolved. -IV fluid as above  Anemia of chronic disease: Hgb relatively stable.  Anemia panel consistent with ACD Recent Labs    12/07/20 0418 12/08/20 0323 12/09/20 0524 12/10/20 0120 12/11/20 0430 12/12/20 0300 12/13/20 0319 12/14/20 0530 12/16/20 0315 12/18/20 0501  HGB 7.3* 7.2* 7.5* 8.0* 7.7* 7.7* 7.8* 7.5* 7.4* 9.3*  -Monitor intermittently  Tobacco use disorder: -Nicotine patch  Hypokalemia/hypophosphatemia/hypomagnesemia -Monitor and replenish as appropriate.  Severe protein calorie malnutrition Body mass index is 14.11 kg/m. Nutrition Problem: Severe Malnutrition Etiology: chronic illness,cancer and cancer related treatments Signs/Symptoms: severe fat depletion,severe muscle depletion,percent weight loss Interventions: Ensure Enlive (each supplement provides 350kcal and 20 grams of protein),MVI,Prostat    Wt Readings from Last 10 Encounters:  12/17/20 48.5 kg  10/12/20 58.1 kg  08/28/20 61.1 kg  07/20/20 68 kg  06/26/20 68 kg  04/03/20 72.6 kg  10/17/19 72.6 kg   DVT prophylaxis:  SCDs Start: 11/03/20 2329  Code Status: Full code Family Communication: Patient and/or RN. Available if any question.  Status is: Inpatient  Remains inpatient appropriate because:Persistent severe electrolyte disturbances, Unsafe d/c plan, IV treatments appropriate due to intensity of illness or inability to take PO and Inpatient level of care appropriate due to severity of illness   Dispo: The patient is from: Home              Anticipated d/c is to: SNF              Anticipated d/c date is: > 3 days               Patient currently is not medically stable to d/c.    Difficult to place patient: Yes       Consultants:  Urology-resolved Oncology-resolved   Sch Meds:  Scheduled Meds: . (feeding supplement)  PROSource Plus  30 mL Oral Daily  . B-complex with vitamin C  1 tablet Oral Daily  . Chlorhexidine Gluconate Cloth  6 each Topical Daily  . dronabinol  2.5 mg Oral BID AC  . feeding supplement  1 Container Oral TID BM  . feeding supplement  237 mL Oral BID BM  . folic acid  1 mg Oral Daily  . hydrocerin   Topical BID  . nicotine  14 mg Transdermal Daily  . polyethylene glycol  17 g Oral BID  . potassium chloride  40 mEq Oral Once  . senna-docusate  1 tablet Oral QHS  . tamsulosin  0.4 mg Oral Nightly  . cyanocobalamin  1,000 mcg Oral Daily   Continuous Infusions: . 0.9 % NaCl with KCl 20 mEq / L 100 mL/hr at 12/18/20 0012   PRN Meds:.acetaminophen (TYLENOL) oral liquid 160 mg/5 mL, hydrOXYzine, ondansetron **OR** ondansetron (ZOFRAN) IV, oxyCODONE, sodium chloride flush, sodium chloride flush, traMADol  Antimicrobials: Anti-infectives (From admission, onward)   Start     Dose/Rate Route Frequency Ordered Stop   11/23/20 1000  meropenem (MERREM) 1 g in sodium chloride 0.9 % 100 mL IVPB        1 g 200 mL/hr over 30 Minutes Intravenous Every 8 hours 11/23/20 0937 12/03/20 1857   11/12/20 2000  ciprofloxacin (CIPRO) tablet 500 mg  500 mg Oral 2 times daily 11/12/20 1543 11/17/20 2359   11/10/20 0800  ciprofloxacin (CIPRO) tablet 500 mg  Status:  Discontinued        500 mg Oral Daily with breakfast 11/09/20 1357 11/12/20 1543   11/06/20 1200  ciprofloxacin (CIPRO) tablet 500 mg  Status:  Discontinued        500 mg Oral 2 times daily 11/06/20 1114 11/09/20 1357   11/05/20 1500  cefdinir (OMNICEF) capsule 300 mg  Status:  Discontinued        300 mg Oral Every 12 hours 11/05/20 1337 11/06/20 1114   11/03/20 2200  vancomycin (VANCOREADY) IVPB 500 mg/100 mL  Status:  Discontinued        500 mg 100 mL/hr over 60 Minutes Intravenous Every 12 hours 11/03/20 1402 11/05/20 1335   11/03/20 1415  piperacillin-tazobactam (ZOSYN) IVPB 3.375 g  Status:  Discontinued        3.375 g 12.5 mL/hr  over 240 Minutes Intravenous Every 8 hours 11/03/20 1401 11/05/20 1337   11/03/20 0645  vancomycin (VANCOCIN) IVPB 1000 mg/200 mL premix        1,000 mg 200 mL/hr over 60 Minutes Intravenous  Once 11/03/20 0631 11/03/20 1147   11/03/20 0600  piperacillin-tazobactam (ZOSYN) IVPB 4.5 g  Status:  Discontinued        4.5 g 200 mL/hr over 30 Minutes Intravenous  Once 11/03/20 0546 11/03/20 0547   11/03/20 0600  piperacillin-tazobactam (ZOSYN) IVPB 3.375 g        3.375 g 100 mL/hr over 30 Minutes Intravenous To Emergency Dept 11/03/20 0548 11/03/20 0650       I have personally reviewed the following labs and images: CBC: Recent Labs  Lab 12/12/20 0300 12/13/20 0319 12/14/20 0530 12/16/20 0315 12/18/20 0501  WBC 17.7* 18.2* 16.7* 15.9* 14.6*  HGB 7.7* 7.8* 7.5* 7.4* 9.3*  HCT 25.3* 26.5* 25.0* 25.0* 29.7*  MCV 90.0 89.8 90.6 91.2 88.1  PLT 375 402* 379 378 360   BMP &GFR Recent Labs  Lab 12/12/20 0300 12/13/20 0319 12/14/20 0530 12/16/20 0315 12/17/20 0400 12/18/20 0501  NA 136 136 137 137 135 134*  K 3.8 3.9 3.8 3.7 3.8 3.9  CL 107 107 108 107 107 107  CO2 22 20* 21* 23 21* 21*  GLUCOSE 94 95 89 87 89 91  BUN 13 15 14 13 11 10   CREATININE 1.12 1.22 0.79 1.02 0.88 0.83  CALCIUM 12.0* 11.9* 11.7* 11.7* 11.6* 11.4*  MG 2.0 2.0  --  1.7 1.7 2.3  PHOS  --   --   --  1.9* 2.6 2.0*   Estimated Creatinine Clearance: 60.9 mL/min (by C-G formula based on SCr of 0.83 mg/dL). Liver & Pancreas: Recent Labs  Lab 12/14/20 0530 12/16/20 0315 12/17/20 0400 12/18/20 0501  ALBUMIN 2.6* 2.6* 2.5* 2.5*   No results for input(s): LIPASE, AMYLASE in the last 168 hours. No results for input(s): AMMONIA in the last 168 hours. Diabetic: No results for input(s): HGBA1C in the last 72 hours. Recent Labs  Lab 12/16/20 0445 12/16/20 2157 12/17/20 0742 12/17/20 1137 12/17/20 1651  GLUCAP 93 102* 80 94 94   Cardiac Enzymes: No results for input(s): CKTOTAL, CKMB, CKMBINDEX,  TROPONINI in the last 168 hours. No results for input(s): PROBNP in the last 8760 hours. Coagulation Profile: No results for input(s): INR, PROTIME in the last 168 hours. Thyroid Function Tests: Recent Labs    12/17/20 0400  TSH 1.866  FREET4 0.71  Lipid Profile: No results for input(s): CHOL, HDL, LDLCALC, TRIG, CHOLHDL, LDLDIRECT in the last 72 hours. Anemia Panel: No results for input(s): VITAMINB12, FOLATE, FERRITIN, TIBC, IRON, RETICCTPCT in the last 72 hours. Urine analysis:    Component Value Date/Time   COLORURINE YELLOW 11/22/2020 0204   APPEARANCEUR CLOUDY (A) 11/22/2020 0204   LABSPEC 1.009 11/22/2020 0204   PHURINE 7.0 11/22/2020 0204   GLUCOSEU NEGATIVE 11/22/2020 0204   HGBUR LARGE (A) 11/22/2020 0204   BILIRUBINUR NEGATIVE 11/22/2020 0204   KETONESUR NEGATIVE 11/22/2020 0204   PROTEINUR 100 (A) 11/22/2020 0204   NITRITE NEGATIVE 11/22/2020 0204   LEUKOCYTESUR MODERATE (A) 11/22/2020 0204   Sepsis Labs: Invalid input(s): PROCALCITONIN, [place]  Microbiology: No results found for this or any previous visit (from the past 240 hour(s)).  Radiology Studies: No results found.    Jullian Clayson T. [place]  If 7PM-7AM, please contact night-coverage [email or web address] 12/18/2020, 2:23 PM

## 2020-12-18 NOTE — Plan of Care (Signed)

## 2020-12-19 LAB — LACTATE DEHYDROGENASE: LDH: 127 U/L (ref 98–192)

## 2020-12-19 LAB — GLUCOSE, CAPILLARY: Glucose-Capillary: 74 mg/dL (ref 70–99)

## 2020-12-19 NOTE — Progress Notes (Signed)
Pt complained about night shift nurse stating that she wasn't bringing his pain medication on time on purpose, stated that she purposely waited 2-3 hours later after it was due to spite him. I informed pt that his pain medication wasn't scheduled but ordered as needed meaning that he has to ask for it.  Jerene Pitch

## 2020-12-19 NOTE — Progress Notes (Signed)
Physical Therapy Treatment Patient Details Name: Jamie Wilson MRN: [id number] DOB: November 07, 1955 Today's Date: 12/19/2020    History of Present Illness 65 y.o. 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 Patient presented secondary to abdominal pain and found to have evidence of sepsis possibly secondary to necrotic bladder mass. Empiric antibiotics initiated. Urology and oncology consulted.    PT Comments    Patient with limited participation due to pain, difficulty getting comfortable in bed and declined even EOB and in bed LE exercises this session.  He has not progressed towards goals in the past two weeks and has refused more than he has participated.  He did not want, however, to terminate PT services and his goal is to try SNF rehab.  PT will continue attempts for mobilization and may pre-medicate prior to next session for improved participation.    Follow Up Recommendations  SNF     Equipment Recommendations  None recommended by PT    Recommendations for Other Services       Precautions / Restrictions Precautions Precautions: Fall Precaution Comments: colostomy    Mobility  Bed Mobility               General bed mobility comments: declined mobility stating he was in a decently comfortable position and did not want to get his hips out of alignment, even refusing LE therex in the bed    Transfers                    Ambulation/Gait                 Stairs             Wheelchair Mobility    Modified Rankin (Stroke Patients Only)       Balance                                            Cognition Arousal/Alertness: Awake/alert Behavior During Therapy: WFL for tasks assessed/performed Overall Cognitive Status: Within Functional Limits for tasks assessed                                 General Comments:  difficult to motivate      Exercises General Exercises - Upper Extremity Shoulder Flexion: AROM;5 reps;Supine Elbow Flexion: AROM;5 reps;Supine;Strengthening (using milk and ensure bottles for resistance)    General Comments General comments (skin integrity, edema, etc.): Encouragement throughout to mobilize or even to do LE Therex in bed, but continue to refuse, stating "fell out" the last time he got up (hypotensive); and that his pain is exacerbated even with UE therex in the bed.  However, when asked if he wanted PT to sign off, said no, he wants to keep getting PT.      Pertinent Vitals/Pain Pain Assessment: Faces Faces Pain Scale: Hurts little more Pain Location: abdominal Pain Descriptors / Indicators: Discomfort;Grimacing Pain Intervention(s): Monitored during session;Limited activity within patient's tolerance    Home Living                      Prior Function            PT Goals (current goals can now be found in the care plan section)  Acute Rehab PT Goals Patient Stated Goal: maybe down the road, in the distant future, to walk more PT Goal Formulation: With patient Time For Goal Achievement: 01/02/21 Potential to Achieve Goals: Fair Progress towards PT goals: Not progressing toward goals - comment    Frequency    Min 2X/week      PT Plan Current plan remains appropriate    Co-evaluation              AM-PAC PT "6 Clicks" Mobility   Outcome Measure  Help needed turning from your back to your side while in a flat bed without using bedrails?: Total Help needed moving from lying on your back to sitting on the side of a flat bed without using bedrails?: Total Help needed moving to and from a bed to a chair (including a wheelchair)?: Total Help needed standing up from a chair using your arms (e.g., wheelchair or bedside chair)?: Total Help needed to walk in hospital room?: Total Help needed climbing 3-5 steps with a railing? : Total 6 Click  Score: 6    End of Session   Activity Tolerance: Patient limited by pain;Patient limited by fatigue Patient left: in bed   PT Visit Diagnosis: Other abnormalities of gait and mobility (R26.89);Muscle weakness (generalized) (M62.81)     Time: 8875-7972 PT Time Calculation (min) (ACUTE ONLY): 11 min  Charges:  $Therapeutic Activity: 8-22 mins                     Jamie Wilson, PT Acute Rehabilitation Services QASUO:[phone] Office:[phone] 12/19/2020    Jamie Wilson 12/19/2020, 3:59 PM

## 2020-12-19 NOTE — Progress Notes (Signed)
PROGRESS NOTE  Jamie Wilson MBW:[id number] DOB: 1956-04-12   PCP: Patient, [name]  Patient is from: Home  DOA: 11/02/2020 LOS: 44  Chief complaints: Abdominal pain  Brief Narrative / Interim history: 65 y.o.malewith PMH ofbladder CA w/ chronic foley placement; bowel obstruction s/p partial bowel resection w/ colostomy. Patient presented secondary to abdominal pain and found to have evidence of sepsis possibly secondary to UTI in the setting of necrotic bladder mass. Empiric antibiotics initiated. Urology and oncology consulted and recommended outpatient follow-up.  He completed 2 weeks of antibiotics for presumed UTI.  Hospital course complicated by anemia, AKI, persistent hypercalcemia and ESBL bacteremia.  AKI resolved.  Completed 10 days of IV meropenem for ESBL bacteremia.  Anemia relatively stable.  However, prolonged hospitalization due to persistent hypercalcemia despite bisphosphonates and continued IV fluid.  His PTH was appropriately suppressed.  PTHrP low.  Bone scan did not not show any lytic or blastic bone lesions suspicious for bone mets.   Subjective: Seen and examined earlier this morning.  Reports abdominal pain overnight.  He feels distended.  He also noted some debris in urine.  Good output from ostomy.  He denies nausea or vomiting.  Objective: Vitals:   12/18/20 0400 12/18/20 1204 12/18/20 2101 12/19/20 0612  BP:  124/71 113/64 117/70  Pulse: 88 (!) 101 94 (!) 102  Resp:  18 18 16   Temp: 98 F (36.7 C) (!) 97.2 F (36.2 C) 97.8 F (36.6 C) 98.1 F (36.7 C)  TempSrc: Oral Oral Oral Oral  SpO2:  98% 95% 98%  Weight:      Height:        Intake/Output Summary (Last 24 hours) at 12/19/2020 1238 Last data filed at 12/19/2020 0600 Gross per 24 hour  Intake 240 ml  Output 800 ml  Net -560 ml   Filed Weights   11/24/20 0508 12/05/20 0644 12/17/20 0504  Weight: 58.1 kg 54.2 kg 48.5 kg    Examination:  GENERAL: Frail and chronically ill-appearing.   Nontoxic. HEENT: MMM.  Vision and hearing grossly intact.  NECK: Supple.  No apparent JVD.  RESP: On RA.  No IWOB.  Fair aeration bilaterally. CVS:  RRR. Heart sounds normal.  ABD/GI/GU: BS+. Abd soft, NTND but some discomfort across lower abdomen. Some dark looking debris's in urine bag but no hematuria.  Normal looking output from ostomy bag. MSK/EXT:  Moves extremities.  Significant muscle mass and subcu fat loss. SKIN: no apparent skin lesion or wound NEURO: Awake, alert and oriented appropriately.  No apparent focal neuro deficit. PSYCH: Calm.  Flat affect.  Procedures:  None  Microbiology summarized: 1/6-influenza and COVID-19 PCR nonreactive. 1/6-urine culture with multiple species 1/6-blood culture NGTD. 1/7-urine cultures with multiple species.  Assessment & Plan: Persistent hypercalcemia: Calcium > 15 on admit>>> 11.7 (corrects to 12.8).  Thought to be due to underlying malignancy.  PTH and PTHrP low.  LDH within normal.  Patient has about 3 L UOP on daily basis. -Received zoledronic acid x3-last dose on 2/19 -Continue IV NS at 100 cc an hour. -TOC to ask if SNF can do IV NS on daily basis -Follow ACE, myeloma panel and repeat PTHrP  Orthostatic hypotension/near syncope/tachycardia syndrome: Improved.  No further significant arrhythmia.  Slightly tachycardic but regular rhythm on telemetry.  -Continue IV fluids as above -Optimize electrolytes.  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  Some debris in urine -Continue Foley catheter-exchanged on 11/23/2019.  He may need irrigation -Closely monitor urine  output -Outpatient elective radical cystectomy, Dr. Jeffie Pollock -Follow-up with oncology, Dr. Irene Limbo outpatient  Sepsis secondary to E. coli UTI: POA ESBL bacteremia  -likely from underlying urothelial carcinoma of bladder with chronic indwelling chronic Foley -Completed 2 weeks of Cipro for E. coli UTI per urology recommendation  -Completed  10 days of IV meropenem for ESBL bacteremia  Debility/physical deconditioning/increased risk of fall-noncompliance.  -Therapy recommended SNF for ongoing rehabilitation  Colostomy status Constipation-resolved. -Bowel regimen and colostomy care  AKI: Baseline Cr 0.8.  Resolved. -IV fluid as above  Anemia of chronic disease: Hgb relatively stable.  Anemia panel consistent with ACD Recent Labs    12/07/20 0418 12/08/20 0323 12/09/20 0524 12/10/20 0120 12/11/20 0430 12/12/20 0300 12/13/20 0319 12/14/20 0530 12/16/20 0315 12/18/20 0501  HGB 7.3* 7.2* 7.5* 8.0* 7.7* 7.7* 7.8* 7.5* 7.4* 9.3*  -Monitor intermittently  Tobacco use disorder: -Nicotine patch  Hypokalemia/hypophosphatemia/hypomagnesemia -Monitor and replenish as appropriate.  Severe protein calorie malnutrition Body mass index is 14.11 kg/m. Nutrition Problem: Severe Malnutrition Etiology: chronic illness,cancer and cancer related treatments Signs/Symptoms: severe fat depletion,severe muscle depletion,percent weight loss Interventions: Ensure Enlive (each supplement provides 350kcal and 20 grams of protein),MVI,Prostat    Wt Readings from Last 10 Encounters:  12/17/20 48.5 kg  10/12/20 58.1 kg  08/28/20 61.1 kg  07/20/20 68 kg  06/26/20 68 kg  04/03/20 72.6 kg  10/17/19 72.6 kg   DVT prophylaxis:  SCDs Start: 11/03/20 2329  Code Status: Full code Family Communication: Patient and/or RN. Available if any question.  Status is: Inpatient  Remains inpatient appropriate because:Persistent severe electrolyte disturbances, Unsafe d/c plan, IV treatments appropriate due to intensity of illness or inability to take PO and Inpatient level of care appropriate due to severity of illness   Dispo: The patient is from: Home              Anticipated d/c is to: SNF              Anticipated d/c date is: > 3 days               Patient currently is not medically stable to d/c.    Difficult to place patient:  Yes       Consultants:  Urology-resolved Oncology-resolved   Sch Meds:  Scheduled Meds: . (feeding supplement) PROSource Plus  30 mL Oral Daily  . B-complex with vitamin C  1 tablet Oral Daily  . Chlorhexidine Gluconate Cloth  6 each Topical Daily  . dronabinol  2.5 mg Oral BID AC  . feeding supplement  1 Container Oral TID BM  . feeding supplement  237 mL Oral BID BM  . folic acid  1 mg Oral Daily  . hydrocerin   Topical BID  . nicotine  14 mg Transdermal Daily  . polyethylene glycol  17 g Oral BID  . senna-docusate  1 tablet Oral QHS  . tamsulosin  0.4 mg Oral Nightly  . cyanocobalamin  1,000 mcg Oral Daily   Continuous Infusions: . 0.9 % NaCl with KCl 20 mEq / L 100 mL/hr at 12/19/20 0603   PRN Meds:.acetaminophen (TYLENOL) oral liquid 160 mg/5 mL, hydrOXYzine, ondansetron **OR** ondansetron (ZOFRAN) IV, oxyCODONE, sodium chloride flush, sodium chloride flush, traMADol  Antimicrobials: Anti-infectives (From admission, onward)   Start     Dose/Rate Route Frequency Ordered Stop   11/23/20 1000  meropenem (MERREM) 1 g in sodium chloride 0.9 % 100 mL IVPB        1 g 200 mL/hr  over 30 Minutes Intravenous Every 8 hours 11/23/20 0937 12/03/20 1857   11/12/20 2000  ciprofloxacin (CIPRO) tablet 500 mg        500 mg Oral 2 times daily 11/12/20 1543 11/17/20 2359   11/10/20 0800  ciprofloxacin (CIPRO) tablet 500 mg  Status:  Discontinued        500 mg Oral Daily with breakfast 11/09/20 1357 11/12/20 1543   11/06/20 1200  ciprofloxacin (CIPRO) tablet 500 mg  Status:  Discontinued        500 mg Oral 2 times daily 11/06/20 1114 11/09/20 1357   11/05/20 1500  cefdinir (OMNICEF) capsule 300 mg  Status:  Discontinued        300 mg Oral Every 12 hours 11/05/20 1337 11/06/20 1114   11/03/20 2200  vancomycin (VANCOREADY) IVPB 500 mg/100 mL  Status:  Discontinued        500 mg 100 mL/hr over 60 Minutes Intravenous Every 12 hours 11/03/20 1402 11/05/20 1335   11/03/20 1415   piperacillin-tazobactam (ZOSYN) IVPB 3.375 g  Status:  Discontinued        3.375 g 12.5 mL/hr over 240 Minutes Intravenous Every 8 hours 11/03/20 1401 11/05/20 1337   11/03/20 0645  vancomycin (VANCOCIN) IVPB 1000 mg/200 mL premix        1,000 mg 200 mL/hr over 60 Minutes Intravenous  Once 11/03/20 0631 11/03/20 1147   11/03/20 0600  piperacillin-tazobactam (ZOSYN) IVPB 4.5 g  Status:  Discontinued        4.5 g 200 mL/hr over 30 Minutes Intravenous  Once 11/03/20 0546 11/03/20 0547   11/03/20 0600  piperacillin-tazobactam (ZOSYN) IVPB 3.375 g        3.375 g 100 mL/hr over 30 Minutes Intravenous To Emergency Dept 11/03/20 0548 11/03/20 0650       I have personally reviewed the following labs and images: CBC: Recent Labs  Lab 12/13/20 0319 12/14/20 0530 12/16/20 0315 12/18/20 0501  WBC 18.2* 16.7* 15.9* 14.6*  HGB 7.8* 7.5* 7.4* 9.3*  HCT 26.5* 25.0* 25.0* 29.7*  MCV 89.8 90.6 91.2 88.1  PLT 402* 379 378 360   BMP &GFR Recent Labs  Lab 12/13/20 0319 12/14/20 0530 12/16/20 0315 12/17/20 0400 12/18/20 0501  NA 136 137 137 135 134*  K 3.9 3.8 3.7 3.8 3.9  CL 107 108 107 107 107  CO2 20* 21* 23 21* 21*  GLUCOSE 95 89 87 89 91  BUN 15 14 13 11 10   CREATININE 1.22 0.79 1.02 0.88 0.83  CALCIUM 11.9* 11.7* 11.7* 11.6* 11.4*  MG 2.0  --  1.7 1.7 2.3  PHOS  --   --  1.9* 2.6 2.0*   Estimated Creatinine Clearance: 60.9 mL/min (by C-G formula based on SCr of 0.83 mg/dL). Liver & Pancreas: Recent Labs  Lab 12/14/20 0530 12/16/20 0315 12/17/20 0400 12/18/20 0501  ALBUMIN 2.6* 2.6* 2.5* 2.5*   No results for input(s): LIPASE, AMYLASE in the last 168 hours. No results for input(s): AMMONIA in the last 168 hours. Diabetic: No results for input(s): HGBA1C in the last 72 hours. Recent Labs  Lab 12/16/20 2157 12/17/20 0742 12/17/20 1137 12/17/20 1651 12/19/20 0854  GLUCAP 102* 80 94 94 74   Cardiac Enzymes: No results for input(s): CKTOTAL, CKMB, CKMBINDEX,  TROPONINI in the last 168 hours. No results for input(s): PROBNP in the last 8760 hours. Coagulation Profile: No results for input(s): INR, PROTIME in the last 168 hours. Thyroid Function Tests: Recent Labs    12/17/20 0400  TSH 1.866  FREET4 0.71   Lipid Profile: No results for input(s): CHOL, HDL, LDLCALC, TRIG, CHOLHDL, LDLDIRECT in the last 72 hours. Anemia Panel: No results for input(s): VITAMINB12, FOLATE, FERRITIN, TIBC, IRON, RETICCTPCT in the last 72 hours. Urine analysis:    Component Value Date/Time   COLORURINE YELLOW 11/22/2020 0204   APPEARANCEUR CLOUDY (A) 11/22/2020 0204   LABSPEC 1.009 11/22/2020 0204   PHURINE 7.0 11/22/2020 0204   GLUCOSEU NEGATIVE 11/22/2020 0204   HGBUR LARGE (A) 11/22/2020 0204   BILIRUBINUR NEGATIVE 11/22/2020 0204   KETONESUR NEGATIVE 11/22/2020 0204   PROTEINUR 100 (A) 11/22/2020 0204   NITRITE NEGATIVE 11/22/2020 0204   LEUKOCYTESUR MODERATE (A) 11/22/2020 0204   Sepsis Labs: Invalid input(s): PROCALCITONIN, Ashley  Microbiology: No results found for this or any previous visit (from the past 240 hour(s)).  Radiology Studies: No results found.    Zierra Laroque T. [place]  If 7PM-7AM, please contact night-coverage [email or web address] 12/19/2020, 12:38 PM

## 2020-12-20 ENCOUNTER — Encounter (HOSPITAL_COMMUNITY): Payer: Self-pay | Admitting: Internal Medicine

## 2020-12-20 LAB — MULTIPLE MYELOMA PANEL, SERUM
Albumin SerPl Elph-Mcnc: 2.2 g/dL — ABNORMAL LOW (ref 2.9–4.4)
Albumin/Glob SerPl: 0.6 — ABNORMAL LOW (ref 0.7–1.7)
Alpha 1: 0.4 g/dL (ref 0.0–0.4)
Alpha2 Glob SerPl Elph-Mcnc: 0.9 g/dL (ref 0.4–1.0)
B-Globulin SerPl Elph-Mcnc: 0.9 g/dL (ref 0.7–1.3)
Gamma Glob SerPl Elph-Mcnc: 2.1 g/dL — ABNORMAL HIGH (ref 0.4–1.8)
Globulin, Total: 4.2 g/dL — ABNORMAL HIGH (ref 2.2–3.9)
IgA: 454 mg/dL — ABNORMAL HIGH (ref 61–437)
IgG (Immunoglobin G), Serum: 1872 mg/dL — ABNORMAL HIGH (ref 603–1613)
IgM (Immunoglobulin M), Srm: 334 mg/dL — ABNORMAL HIGH (ref 20–172)
Total Protein ELP: 6.4 g/dL (ref 6.0–8.5)

## 2020-12-20 LAB — RENAL FUNCTION PANEL
Albumin: 2.3 g/dL — ABNORMAL LOW (ref 3.5–5.0)
Anion gap: 5 (ref 5–15)
BUN: 12 mg/dL (ref 8–23)
CO2: 19 mmol/L — ABNORMAL LOW (ref 22–32)
Calcium: 10.9 mg/dL — ABNORMAL HIGH (ref 8.9–10.3)
Chloride: 112 mmol/L — ABNORMAL HIGH (ref 98–111)
Creatinine, Ser: 0.94 mg/dL (ref 0.61–1.24)
GFR, Estimated: >60 mL/min (ref >60)
Glucose, Bld: 90 mg/dL (ref 70–99)
Phosphorus: 1.6 mg/dL — ABNORMAL LOW (ref 2.5–4.6)
Potassium: 4.3 mmol/L (ref 3.5–5.1)
Sodium: 136 mmol/L (ref 135–145)

## 2020-12-20 LAB — MAGNESIUM: Magnesium: 1.7 mg/dL (ref 1.7–2.4)

## 2020-12-20 LAB — GLUCOSE, CAPILLARY: Glucose-Capillary: 81 mg/dL (ref 70–99)

## 2020-12-20 LAB — ANGIOTENSIN CONVERTING ENZYME: Angiotensin-Converting Enzyme: 23 U/L (ref 14–82)

## 2020-12-20 MED ORDER — DRONABINOL 2.5 MG PO CAPS
2.5000 mg | ORAL_CAPSULE | Freq: Every day | ORAL | Status: DC
  Administered 2020-12-21 – 2020-12-23 (×3): 2.5 mg via ORAL
  Filled 2020-12-20 (×3): qty 1

## 2020-12-20 MED ORDER — DRONABINOL 2.5 MG PO CAPS
5.0000 mg | ORAL_CAPSULE | Freq: Every day | ORAL | Status: DC
  Administered 2020-12-20 – 2020-12-23 (×4): 5 mg via ORAL
  Filled 2020-12-20 (×4): qty 2

## 2020-12-20 MED ORDER — PROSOURCE PLUS PO LIQD
30.0000 mL | Freq: Two times a day (BID) | ORAL | Status: DC
  Administered 2020-12-21: 30 mL via ORAL
  Filled 2020-12-20 (×4): qty 30

## 2020-12-20 NOTE — TOC Progression Note (Signed)
Transition of Care [facility]) - Progression Note    Patient Details  Name: [name] MRN: [id number] Date of Birth: [birth year]  [facility]) CM/SW Contact  Purcell Mouton, RN Phone Number: 12/20/2020, 1:23 PM  Clinical Narrative:    [facility] SNF/x3 with no answer left VM.    Expected Discharge Plan: Skilled Nursing Facility Barriers to Discharge: Continued Medical Work up  Expected Discharge Plan and Services Expected Discharge Plan: [place]         Expected Discharge Date:  (unknown)                                     Social Determinants of Health (SDOH) Interventions    Readmission Risk Interventions Readmission Risk Prevention Plan 10/04/2020  Transportation Screening Complete  PCP or Specialist Appt within 5-7 Days Complete  Home Care Screening Complete  Medication Review (RN CM) Complete

## 2020-12-20 NOTE — TOC Progression Note (Signed)
Transition of Care [facility]) - Progression Note    Patient Details  Name: Jamie Wilson MRN: [id number] Date of Birth: [birth year]  Transition of Care [facility]) CM/SW Contact  Purcell Mouton, RN Phone Number: 12/20/2020, 2:26 PM  Clinical Narrative:    [place] admission coordinator returned call. SNF can administer IVF. Also will need a few days before pt can return related to facility needing to get authorization from  [facility].   Expected Discharge Plan: Skilled Nursing Facility Barriers to Discharge: Continued Medical Work up  Expected Discharge Plan and Services Expected Discharge Plan: [place]         Expected Discharge Date:  (unknown)                                     Social Determinants of Health (SDOH) Interventions    Readmission Risk Interventions Readmission Risk Prevention Plan 10/04/2020  Transportation Screening Complete  PCP or Specialist Appt within 5-7 Days Complete  Home Care Screening Complete  Medication Review (RN CM) Complete

## 2020-12-20 NOTE — Progress Notes (Signed)
PROGRESS NOTE  Jamie Wilson OIN:[id number] DOB: 1956-01-18   PCP: Patient, [name]  Patient is from: Home  DOA: 11/02/2020 LOS: 54  Chief complaints: Abdominal pain  Brief Narrative / Interim history: 65 y.o.malewith PMH ofbladder CA w/ chronic foley placement; bowel obstruction s/p partial bowel resection w/ colostomy. Patient presented secondary to abdominal pain and found to have evidence of sepsis possibly secondary to UTI in the setting of necrotic bladder mass. Empiric antibiotics initiated. Urology and oncology consulted and recommended outpatient follow-up.  He completed 2 weeks of antibiotics for presumed UTI.  Hospital course complicated by anemia, AKI, persistent hypercalcemia and ESBL bacteremia.  AKI resolved.  Completed 10 days of IV meropenem for ESBL bacteremia.  Anemia relatively stable.  However, prolonged hospitalization due to persistent hypercalcemia despite bisphosphonates and continued IV fluid.  His PTH was appropriately suppressed.  PTHrP low.  Bone scan did not not show any lytic or blastic bone lesions suspicious for bone mets.   Subjective: Seen and examined earlier this morning.  Reports abdominal pain overnight.  He feels distended.  He also noted some debris in urine.  Good output from ostomy.  He denies nausea or vomiting.  Objective: Vitals:   12/19/20 1315 12/19/20 2133 12/20/20 0624 12/20/20 1449  BP: 110/71 122/69 115/68 112/67  Pulse: (!) 109 (!) 110 (!) 105 (!) 110  Resp: (!) 28 20 20 15   Temp: 98.2 F (36.8 C) 98.2 F (36.8 C) 98.1 F (36.7 C) 98.5 F (36.9 C)  TempSrc: Oral Oral Oral Oral  SpO2: 97% 95% 96% 96%  Weight:      Height:        Intake/Output Summary (Last 24 hours) at 12/20/2020 1526 Last data filed at 12/20/2020 0631 Gross per 24 hour  Intake -  Output 1000 ml  Net -1000 ml   Filed Weights   11/24/20 0508 12/05/20 0644 12/17/20 0504  Weight: 58.1 kg 54.2 kg 48.5 kg    Examination:  GENERAL: Frail and chronically  ill-appearing.  Nontoxic. HEENT: MMM.  Vision and hearing grossly intact.  NECK: Supple.  No apparent JVD.  RESP: On RA.  No IWOB.  Fair aeration bilaterally. CVS:  RRR. Heart sounds normal.  ABD/GI/GU: BS+. Abd soft, NTND but some discomfort across lower abdomen. Some dark looking debris's in urine bag but no hematuria.  Normal looking output from ostomy bag. MSK/EXT:  Moves extremities.  Significant muscle mass and subcu fat loss. SKIN: no apparent skin lesion or wound NEURO: Awake, alert and oriented appropriately.  No apparent focal neuro deficit. PSYCH: Calm.  Flat affect.  Procedures:  None  Microbiology summarized: 1/6-influenza and COVID-19 PCR nonreactive. 1/6-urine culture with multiple species 1/6-blood culture NGTD. 1/7-urine cultures with multiple species.  Assessment & Plan: Persistent hypercalcemia: Calcium > 15 on admit>>> 10.9 (corrects to 12.3). Low PTH, low phosphorus and history of bladder cancer suggest this hypercalcemia of malignancy.  PTHrP low but won't exclude hypercalcemia of malignancy. Could be due to immobility as well.  LDH within normal.  -Received zoledronic acid x3-last dose on 2/19 -Follow ACE, MM panel, vit D levels, ionized Ca level and repeat PTHrP -Nephrology suggested discussing with oncology-see below -Continue IV NS at 100 cc an hour (more than maintenance rate).  Orthostatic hypotension/near syncope/tachycardia syndrome: Improved.  No further significant arrhythmia.  Slightly tachycardic but regular rhythm on telemetry.  -Continue IV fluids as above -Optimize electrolytes.  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 No mets on body scan.  -  Both urology and oncology recommended outpatient follow up earlier in his hospitalizations. -Reached out to both experts on 2/21 to see if something can be done while here at least to help with hypercalcemia. Dr. Irene Limbo suggesting palliative radiation if urologic surgery is  completely off the table -Will follow further recs -Continue Foley catheter-exchanged on 11/23/2019.  -Closely monitor urine output  Sepsis secondary to E. coli UTI: POA ESBL bacteremia  -likely from underlying urothelial carcinoma of bladder with chronic indwelling chronic Foley -Completed 2 weeks of Cipro for E. coli UTI per urology recommendation  -Completed 10 days of IV meropenem for ESBL bacteremia  Debility/physical deconditioning/increased risk of fall-noncompliance.  -Therapy recommended SNF for ongoing rehabilitation  Colostomy status Constipation-resolved. -Bowel regimen and colostomy care  AKI: Baseline Cr 0.8.  Resolved. -IV fluid as above  Anemia of chronic disease: Hgb relatively stable.  Anemia panel consistent with ACD Recent Labs    12/07/20 0418 12/08/20 0323 12/09/20 0524 12/10/20 0120 12/11/20 0430 12/12/20 0300 12/13/20 0319 12/14/20 0530 12/16/20 0315 12/18/20 0501  HGB 7.3* 7.2* 7.5* 8.0* 7.7* 7.7* 7.8* 7.5* 7.4* 9.3*  -Monitor intermittently  Tobacco use disorder: -Nicotine patch  Hypokalemia/hypophosphatemia/hypomagnesemia -Replenish and recheck.  Severe protein calorie malnutrition Body mass index is 14.11 kg/m. Nutrition Problem: Severe Malnutrition Etiology: chronic illness,cancer and cancer related treatments Signs/Symptoms: severe fat depletion,severe muscle depletion,percent weight loss Interventions: Ensure Enlive (each supplement provides 350kcal and 20 grams of protein),MVI,Prostat    Wt Readings from Last 10 Encounters:  12/17/20 48.5 kg  10/12/20 58.1 kg  08/28/20 61.1 kg  07/20/20 68 kg  06/26/20 68 kg  04/03/20 72.6 kg  10/17/19 72.6 kg   DVT prophylaxis:  SCDs Start: 11/03/20 2329  Code Status: Full code Family Communication: Patient and/or RN. Available if any question.  Status is: Inpatient  Remains inpatient appropriate because:Persistent severe electrolyte disturbances, Unsafe d/c plan, IV treatments  appropriate due to intensity of illness or inability to take PO and Inpatient level of care appropriate due to severity of illness   Dispo: The patient is from: Home              Anticipated d/c is to: SNF              Anticipated d/c date is: > 3 days               Patient currently is not medically stable to d/c.    Difficult to place patient: Yes       Consultants:  Urology Oncology   Sch Meds:  Scheduled Meds: . (feeding supplement) PROSource Plus  30 mL Oral BID BM  . B-complex with vitamin C  1 tablet Oral Daily  . Chlorhexidine Gluconate Cloth  6 each Topical Daily  . dronabinol  2.5 mg Oral BID AC  . feeding supplement  1 Container Oral TID BM  . feeding supplement  237 mL Oral BID BM  . folic acid  1 mg Oral Daily  . hydrocerin   Topical BID  . nicotine  14 mg Transdermal Daily  . polyethylene glycol  17 g Oral BID  . senna-docusate  1 tablet Oral QHS  . tamsulosin  0.4 mg Oral Nightly  . cyanocobalamin  1,000 mcg Oral Daily   Continuous Infusions: . 0.9 % NaCl with KCl 20 mEq / L 100 mL/hr at 12/20/20 0140   PRN Meds:.acetaminophen (TYLENOL) oral liquid 160 mg/5 mL, hydrOXYzine, ondansetron **OR** ondansetron (ZOFRAN) IV, oxyCODONE, sodium chloride flush, sodium chloride flush,  traMADol  Antimicrobials: Anti-infectives (From admission, onward)   Start     Dose/Rate Route Frequency Ordered Stop   11/23/20 1000  meropenem (MERREM) 1 g in sodium chloride 0.9 % 100 mL IVPB        1 g 200 mL/hr over 30 Minutes Intravenous Every 8 hours 11/23/20 0937 12/03/20 1857   11/12/20 2000  ciprofloxacin (CIPRO) tablet 500 mg        500 mg Oral 2 times daily 11/12/20 1543 11/17/20 2359   11/10/20 0800  ciprofloxacin (CIPRO) tablet 500 mg  Status:  Discontinued        500 mg Oral Daily with breakfast 11/09/20 1357 11/12/20 1543   11/06/20 1200  ciprofloxacin (CIPRO) tablet 500 mg  Status:  Discontinued        500 mg Oral 2 times daily 11/06/20 1114 11/09/20 1357    11/05/20 1500  cefdinir (OMNICEF) capsule 300 mg  Status:  Discontinued        300 mg Oral Every 12 hours 11/05/20 1337 11/06/20 1114   11/03/20 2200  vancomycin (VANCOREADY) IVPB 500 mg/100 mL  Status:  Discontinued        500 mg 100 mL/hr over 60 Minutes Intravenous Every 12 hours 11/03/20 1402 11/05/20 1335   11/03/20 1415  piperacillin-tazobactam (ZOSYN) IVPB 3.375 g  Status:  Discontinued        3.375 g 12.5 mL/hr over 240 Minutes Intravenous Every 8 hours 11/03/20 1401 11/05/20 1337   11/03/20 0645  vancomycin (VANCOCIN) IVPB 1000 mg/200 mL premix        1,000 mg 200 mL/hr over 60 Minutes Intravenous  Once 11/03/20 0631 11/03/20 1147   11/03/20 0600  piperacillin-tazobactam (ZOSYN) IVPB 4.5 g  Status:  Discontinued        4.5 g 200 mL/hr over 30 Minutes Intravenous  Once 11/03/20 0546 11/03/20 0547   11/03/20 0600  piperacillin-tazobactam (ZOSYN) IVPB 3.375 g        3.375 g 100 mL/hr over 30 Minutes Intravenous To Emergency Dept 11/03/20 0548 11/03/20 0650       I have personally reviewed the following labs and images: CBC: Recent Labs  Lab 12/14/20 0530 12/16/20 0315 12/18/20 0501  WBC 16.7* 15.9* 14.6*  HGB 7.5* 7.4* 9.3*  HCT 25.0* 25.0* 29.7*  MCV 90.6 91.2 88.1  PLT 379 378 360   BMP &GFR Recent Labs  Lab 12/14/20 0530 12/16/20 0315 12/17/20 0400 12/18/20 0501 12/20/20 1213  NA 137 137 135 134* 136  K 3.8 3.7 3.8 3.9 4.3  CL 108 107 107 107 112*  CO2 21* 23 21* 21* 19*  GLUCOSE 89 87 89 91 90  BUN 14 13 11 10 12   CREATININE 0.79 1.02 0.88 0.83 0.94  CALCIUM 11.7* 11.7* 11.6* 11.4* 10.9*  MG  --  1.7 1.7 2.3 1.7  PHOS  --  1.9* 2.6 2.0* 1.6*   Estimated Creatinine Clearance: 53.7 mL/min (by C-G formula based on SCr of 0.94 mg/dL). Liver & Pancreas: Recent Labs  Lab 12/14/20 0530 12/16/20 0315 12/17/20 0400 12/18/20 0501 12/20/20 1213  ALBUMIN 2.6* 2.6* 2.5* 2.5* 2.3*   No results for input(s): LIPASE, AMYLASE in the last 168 hours. No results  for input(s): AMMONIA in the last 168 hours. Diabetic: No results for input(s): HGBA1C in the last 72 hours. Recent Labs  Lab 12/17/20 0742 12/17/20 1137 12/17/20 1651 12/19/20 0854 12/20/20 0752  GLUCAP 80 94 94 74 81   Cardiac Enzymes: No results for input(s): CKTOTAL,  CKMB, CKMBINDEX, TROPONINI in the last 168 hours. No results for input(s): PROBNP in the last 8760 hours. Coagulation Profile: No results for input(s): INR, PROTIME in the last 168 hours. Thyroid Function Tests: No results for input(s): TSH, T4TOTAL, FREET4, T3FREE, THYROIDAB in the last 72 hours. Lipid Profile: No results for input(s): CHOL, HDL, LDLCALC, TRIG, CHOLHDL, LDLDIRECT in the last 72 hours. Anemia Panel: No results for input(s): VITAMINB12, FOLATE, FERRITIN, TIBC, IRON, RETICCTPCT in the last 72 hours. Urine analysis:    Component Value Date/Time   COLORURINE YELLOW 11/22/2020 0204   APPEARANCEUR CLOUDY (A) 11/22/2020 0204   LABSPEC 1.009 11/22/2020 0204   PHURINE 7.0 11/22/2020 0204   GLUCOSEU NEGATIVE 11/22/2020 0204   HGBUR LARGE (A) 11/22/2020 0204   BILIRUBINUR NEGATIVE 11/22/2020 0204   KETONESUR NEGATIVE 11/22/2020 0204   PROTEINUR 100 (A) 11/22/2020 0204   NITRITE NEGATIVE 11/22/2020 0204   LEUKOCYTESUR MODERATE (A) 11/22/2020 0204   Sepsis Labs: Invalid input(s): PROCALCITONIN, Cash  Microbiology: No results found for this or any previous visit (from the past 240 hour(s)).  Radiology Studies: No results found.    Citlalli Weikel T. Ivor  If 7PM-7AM, please contact night-coverage [email or web address] 12/20/2020, 3:26 PM

## 2020-12-20 NOTE — Progress Notes (Signed)
Nutrition Follow-up  DOCUMENTATION CODES:   Severe malnutrition in context of chronic illness,Underweight  INTERVENTION:  - continue Ensure Enlive BID and Boost Breeze TID. - will increase 30 ml Prosource Plus from once/day to BID.  - if PO intakes remain poor, recommend small bore NGT and initiation of TF.    NUTRITION DIAGNOSIS:   Severe Malnutrition related to chronic illness,cancer and cancer related treatments as evidenced by severe fat depletion,severe muscle depletion,percent weight loss. -ongoing  GOAL:   Patient will meet greater than or equal to 90% of their needs -unmet  MONITOR:   PO intake,Supplement acceptance,Labs,I & O's,Weight trends  ASSESSMENT:   65 y.o. 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 Patient presented secondary to abdominal pain and found to have evidence of sepsis possibly secondary to necrotic bladder mass.  The most recently documented meal intakes were 0% of all meals 2/17; 20% of lunch 2/18; 0% of breakfast and 30% of lunch on 2/19.   Per review of orders, he has been accepting Ensure ~75% of the time offered over the past week, Boost Breeze ~90% of the time offered over the past week, and Prosource 100% of the time offered over the past week.  Patient laying in bed. He reports that he has intermittent abdominal cramping that seems to occur when he has been laying in the same position for a prolonged time.   He denies nausea or overt abdominal pain. He denies any chewing or swallowing issues but states that he has been experiencing taste alteration (lack of taste and "off" taste) for the past 2 months; he is unsure if it has worsened over time.   Patient reports becoming burned out on oral nutrition supplements and on the hospital menu. He does not have any family or any friends who would be able to bring him food fro outside of the hospital.   He  feels that marinol sometimes helps with appetite and he is able to to eat well and other times he notices no effect from it.   He was last weighed on 2/19 at which time he weighed 107 lb and prior to that the most recent weight was on 2/7 when he weighed 119 lb. This indicates 12 lb weight loss (10% body weight) in 2 weeks.   MD note from yesterday indicated that plan at the time of d/c is for SNF and that d/c is likely >3 days away.    Labs reviewed; Cl: 112 mmol/l, Ca: 10.9 mg/dl, Phos: 1.6 mg/dl.  Medications reviewed; 1 tablet vitamin B complex-vitamin C/day, 2.5 mg marinol BID, 1 mg folvite/day, 17 g miralax BID, 1 tablet senokot/day, 1000 mcg oral cyanocobalamin/day.  IVF; NS-20 mEq KCl @ 100 ml/hr.    Diet Order:   Diet Order            DIET SOFT Room service appropriate? Yes; Fluid consistency: Thin  Diet effective now                 EDUCATION NEEDS:   No education needs have been identified at this time  Skin:  Skin Assessment: Reviewed RN Assessment  Last BM:  2/15  Height:   Ht Readings from Last 1 Encounters:  11/02/20 6\' 1"  (1.854 m)    Weight:   Wt Readings from Last 1 Encounters:  12/17/20 48.5 kg    Estimated Nutritional Needs:  Kcal:  2100-2300 Protein:  100-115g Fluid:  2.1L/day      Jarome Matin, MS, RD, LDN, CNSC Inpatient Clinical Dietitian RD pager # available in [place]  After hours/weekend pager # available in [facility]

## 2020-12-21 LAB — RENAL FUNCTION PANEL
Albumin: 2.3 g/dL — ABNORMAL LOW (ref 3.5–5.0)
Anion gap: 4 — ABNORMAL LOW (ref 5–15)
BUN: 12 mg/dL (ref 8–23)
CO2: 20 mmol/L — ABNORMAL LOW (ref 22–32)
Calcium: 11.4 mg/dL — ABNORMAL HIGH (ref 8.9–10.3)
Chloride: 111 mmol/L (ref 98–111)
Creatinine, Ser: 0.81 mg/dL (ref 0.61–1.24)
GFR, Estimated: >60 mL/min (ref >60)
Glucose, Bld: 94 mg/dL (ref 70–99)
Phosphorus: 1.6 mg/dL — ABNORMAL LOW (ref 2.5–4.6)
Potassium: 4.2 mmol/L (ref 3.5–5.1)
Sodium: 135 mmol/L (ref 135–145)

## 2020-12-21 LAB — GLUCOSE, CAPILLARY: Glucose-Capillary: 83 mg/dL (ref 70–99)

## 2020-12-21 LAB — CBC
HCT: 27.2 % — ABNORMAL LOW (ref 39.0–52.0)
Hemoglobin: 8.2 g/dL — ABNORMAL LOW (ref 13.0–17.0)
MCH: 27.3 pg (ref 26.0–34.0)
MCHC: 30.1 g/dL (ref 30.0–36.0)
MCV: 90.7 fL (ref 80.0–100.0)
Platelets: 358 10*3/uL (ref 150–400)
RBC: 3 MIL/uL — ABNORMAL LOW (ref 4.22–5.81)
RDW: 16 % — ABNORMAL HIGH (ref 11.5–15.5)
WBC: 15.8 10*3/uL — ABNORMAL HIGH (ref 4.0–10.5)
nRBC: 0 % (ref 0.0–0.2)

## 2020-12-21 LAB — VITAMIN D 25 HYDROXY (VIT D DEFICIENCY, FRACTURES): Vit D, 25-Hydroxy: 34.32 ng/mL (ref 30–100)

## 2020-12-21 LAB — MAGNESIUM: Magnesium: 1.8 mg/dL (ref 1.7–2.4)

## 2020-12-21 MED ORDER — FUROSEMIDE 20 MG PO TABS
20.0000 mg | ORAL_TABLET | Freq: Every day | ORAL | Status: DC
  Administered 2020-12-21 – 2020-12-23 (×3): 20 mg via ORAL
  Filled 2020-12-21 (×3): qty 1

## 2020-12-21 MED ORDER — K PHOS MONO-SOD PHOS DI & MONO 155-852-130 MG PO TABS
250.0000 mg | ORAL_TABLET | Freq: Three times a day (TID) | ORAL | Status: DC
  Administered 2020-12-21: 250 mg via ORAL
  Filled 2020-12-21 (×9): qty 1

## 2020-12-21 MED ORDER — K PHOS MONO-SOD PHOS DI & MONO 155-852-130 MG PO TABS: 250.0000 mg | ORAL_TABLET | Freq: Three times a day (TID) | ORAL | Status: DC

## 2020-12-21 NOTE — Progress Notes (Signed)
PROGRESS NOTE    Jamie Wilson  EEF:[id number] DOB: 01-Feb-1956 DOA: 11/02/2020 PCP: Patient, [name]    Chief Complaint  Patient presents with  . Pain  . Bladder Cancer    Brief Narrative:  65 year old gentleman with prior history of bladder cancer with chronic Foley placement, bowel obstruction s/p partial bowel resection with colostomy presents with abdominal pain found to have evidence secondary to UTI in the setting of necrotic bladder mass.  Urology, oncology consulted and he completed a course of 2 weeks of IV antibiotics.  Hospital course complicated by anemia, hypercalcemia, AKI and ESBL bacteremia patient further completed another 10 days of IV meropenem for ESBL bacteremia.  Bone scan was done did not show any lytic or blastic bone lesions at this time. PT evaluation recommending SNF and TOC on board. Assessment & Plan:   Principal Problem:   ESBL (extended spectrum beta-lactamase) producing bacteria infection Active Problems:   Malnutrition (HCC)   Sepsis (HCC)   Protein-calorie malnutrition, severe   Lower urinary tract infectious disease   Palliative care by specialist   Goals of care, counseling/discussion  Persistent hypercalcemia Calcium on admission was 15, improved to 11. 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 Alendronic acid x3 last dose on 12/17/2020 Oncology consulted and recommendations given.     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 Oncology recommend outpatient follow-up.    Sepsis secondary to E. coli UTI, present on admission and hospital course complicated by ESBL bacteremia Sepsis physiology resolved Patient completed the course of antibiotics.  Patient currently is afebrile. Persistent leukocytosis probably reactive.     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   AKI Resolved.   Anemia of chronic  disease Transfuse to keep hemoglobin greater than 7   Hypokalemia, hypophosphatemia, hypomagnesemia Replaced.   Severe protein calorie malnutrition Secondary to chronic illness, cancer. Dietary consulted and recommendations given.      DVT prophylaxis: SCDs Code Status: (FullCode) Family Communication: none at bedside.  Disposition:   Status is: Inpatient  Remains inpatient appropriate because:Unsafe d/c plan and IV treatments appropriate due to intensity of illness or inability to take PO   Dispo: The patient is from: Home              Anticipated d/c is to: SNF              Anticipated d/c date is: 2 days              Patient currently is not medically stable to d/c.   Difficult to place patient No       Level of care: Med-Surg Consultants:   Palliative care  Procedures: None Antimicrobials: None  Subjective: Patient requested not to work with PT today. Reports having pain all over. No chest pain or sob. No nausea, vomiting   Objective: Vitals:   12/20/20 2302 12/21/20 0500 12/21/20 0504 12/21/20 1251  BP: 104/68  122/74 119/71  Pulse: 97  (!) 105 (!) 103  Resp: 15  15 (!) 24  Temp: 98.5 F (36.9 C)  97.8 F (36.6 C) 98.4 F (36.9 C)  TempSrc:   Oral Oral  SpO2: 97%  96% 96%  Weight:  56.4 kg    Height:        Intake/Output Summary (Last 24 hours) at 12/21/2020 1329 Last data filed at 12/21/2020 0504 Gross per 24 hour  Intake --  Output 2500  ml  Net -2500 ml   Filed Weights   12/05/20 0644 12/17/20 0504 12/21/20 0500  Weight: 54.2 kg 48.5 kg 56.4 kg    Examination:  General exam: Appears calm and comfortable  Respiratory system: Clear to auscultation. Respiratory effort normal. Cardiovascular system: S1 & S2 heard, RRR. No JVD,. No pedal edema. Gastrointestinal system: Abdomen is nondistended, soft and nontender. Normal bowel sounds heard. Central nervous system: Alert and oriented. No focal neurological deficits. Extremities:  Symmetric 5 x 5 power. Skin: No rashes,  Psychiatry: flat affect.     Data Reviewed: I have personally reviewed following labs and imaging studies  CBC: Recent Labs  Lab 12/16/20 0315 12/18/20 0501 12/21/20 0439  WBC 15.9* 14.6* 15.8*  HGB 7.4* 9.3* 8.2*  HCT 25.0* 29.7* 27.2*  MCV 91.2 88.1 90.7  PLT 378 360 324    Basic Metabolic Panel: Recent Labs  Lab 12/16/20 0315 12/17/20 0400 12/18/20 0501 12/20/20 1213 12/21/20 0439  NA 137 135 134* 136 135  K 3.7 3.8 3.9 4.3 4.2  CL 107 107 107 112* 111  CO2 23 21* 21* 19* 20*  GLUCOSE 87 89 91 90 94  BUN 13 11 10 12 12   CREATININE 1.02 0.88 0.83 0.94 0.81  CALCIUM 11.7* 11.6* 11.4* 10.9* 11.4*  MG 1.7 1.7 2.3 1.7 1.8  PHOS 1.9* 2.6 2.0* 1.6* 1.6*    GFR: Estimated Creatinine Clearance: 72.5 mL/min (by C-G formula based on SCr of 0.81 mg/dL).  Liver Function Tests: Recent Labs  Lab 12/16/20 0315 12/17/20 0400 12/18/20 0501 12/20/20 1213 12/21/20 0439  ALBUMIN 2.6* 2.5* 2.5* 2.3* 2.3*    CBG: Recent Labs  Lab 12/17/20 1137 12/17/20 1651 12/19/20 0854 12/20/20 0752 12/21/20 0849  GLUCAP 94 94 74 81 83     No results found for this or any previous visit (from the past 240 hour(s)).       Radiology Studies: No results found.      Scheduled Meds: . (feeding supplement) PROSource Plus  30 mL Oral BID BM  . B-complex with vitamin C  1 tablet Oral Daily  . Chlorhexidine Gluconate Cloth  6 each Topical Daily  . dronabinol  2.5 mg Oral QAC lunch   And  . dronabinol  5 mg Oral QAC supper  . feeding supplement  1 Container Oral TID BM  . feeding supplement  237 mL Oral BID BM  . folic acid  1 mg Oral Daily  . hydrocerin   Topical BID  . nicotine  14 mg Transdermal Daily  . polyethylene glycol  17 g Oral BID  . senna-docusate  1 tablet Oral QHS  . tamsulosin  0.4 mg Oral Nightly  . cyanocobalamin  1,000 mcg Oral Daily   Continuous Infusions: . 0.9 % NaCl with KCl 20 mEq / L 100 mL/hr at  12/21/20 0140     LOS: 93 days       [name], MD [facility]   To contact the attending provider between 7A-7P or the covering provider during after hours 7P-7A, please log into the web site [email or web address] and access using universal  password for that web site. If you do not have the password, please call the hospital operator.  12/21/2020, 1:29 PM

## 2020-12-21 NOTE — Progress Notes (Signed)
PT Cancellation Note  Patient Details Name: Jamie Wilson MRN: [id number] DOB: September 03, 1956   Cancelled Treatment:    Reason Eval/Treat Not Completed: Fatigue/lethargy limiting ability to participate. Pt refused therapy stating he is tired and doesn't want to do it. Will attempt again another day.   Lelon Mast 12/21/2020, 1:17 PM

## 2020-12-22 LAB — BASIC METABOLIC PANEL
Anion gap: 8 (ref 5–15)
BUN: 12 mg/dL (ref 8–23)
CO2: 20 mmol/L — ABNORMAL LOW (ref 22–32)
Calcium: 11.8 mg/dL — ABNORMAL HIGH (ref 8.9–10.3)
Chloride: 107 mmol/L (ref 98–111)
Creatinine, Ser: 0.95 mg/dL (ref 0.61–1.24)
GFR, Estimated: >60 mL/min (ref >60)
Glucose, Bld: 102 mg/dL — ABNORMAL HIGH (ref 70–99)
Potassium: 3.9 mmol/L (ref 3.5–5.1)
Sodium: 135 mmol/L (ref 135–145)

## 2020-12-22 LAB — SARS CORONAVIRUS 2 (TAT 6-24 HRS): SARS Coronavirus 2: NEGATIVE

## 2020-12-22 LAB — GLUCOSE, CAPILLARY: Glucose-Capillary: 88 mg/dL (ref 70–99)

## 2020-12-22 LAB — CALCITRIOL (1,25 DI-OH VIT D): Vit D, 1,25-Dihydroxy: 108 pg/mL — ABNORMAL HIGH (ref 19.9–79.3)

## 2020-12-22 LAB — CALCIUM, IONIZED: Calcium, Ionized, Serum: 7.9 mg/dL — ABNORMAL HIGH (ref 4.5–5.6)

## 2020-12-22 NOTE — Progress Notes (Signed)
PROGRESS NOTE    Jamie Wilson  JQB:[id number] DOB: 1956/09/23 DOA: 11/02/2020 PCP: Patient, [name]    Chief Complaint  Patient presents with   Pain   Bladder Cancer    Brief Narrative:  65 year old gentleman with prior history of bladder cancer with chronic Foley placement, bowel obstruction s/p partial bowel resection with colostomy presents with abdominal pain found to have evidence secondary to UTI in the setting of necrotic bladder mass.  Urology, oncology consulted and he completed a course of 2 weeks of IV antibiotics.  Hospital course complicated by anemia, hypercalcemia, AKI and ESBL bacteremia patient further completed another 10 days of IV meropenem for ESBL bacteremia.  Bone scan was done did not show any lytic or blastic bone lesions at this time. PT evaluation recommending SNF and TOC on board. Pt refused working with PT, refused palliative care.  Assessment & Plan:   Principal Problem:   ESBL (extended spectrum beta-lactamase) producing bacteria infection Active Problems:   Malnutrition (HCC)   Sepsis (HCC)   Protein-calorie malnutrition, severe   Lower urinary tract infectious disease   Palliative care by specialist   Goals of care, counseling/discussion  Persistent hypercalcemia Calcium on admission was 15, improved to 11. Persistently high, recommend to continue with IV fluids today and recheck calcium in am.  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 Alendronic acid x3 last dose on 12/17/2020 Oncology consulted and recommendations given.     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 Oncology recommend outpatient follow-up.    Sepsis secondary to E. coli UTI, present on admission and hospital course complicated by ESBL bacteremia Sepsis physiology resolved Patient completed the course of antibiotics.  Patient currently is afebrile. Persistent leukocytosis  probably reactive. He does not appear to be toxic.     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 No abdominal pain.   AKI Resolved.   Anemia of chronic disease Transfuse to keep hemoglobin greater than 7   Hypokalemia, hypophosphatemia, hypomagnesemia Replaced.   Severe protein calorie malnutrition Secondary to chronic illness, cancer. Dietary consulted and recommendations given.      DVT prophylaxis: SCDs Code Status: (FullCode) Family Communication: none at bedside.  Disposition:   Status is: Inpatient  Remains inpatient appropriate because:Unsafe d/c plan and IV treatments appropriate due to intensity of illness or inability to take PO   Dispo: The patient is from: Home              Anticipated d/c is to: SNF              Anticipated d/c date is: 1 day              Patient currently is not medically stable to d/c.   Difficult to place patient No       Level of care: Med-Surg Consultants:   Palliative care  Procedures: None Antimicrobials: None  Subjective: Patient reports pain in the bladder, low back pain, no nausea vomiting or abdominal pain  Objective: Vitals:   12/21/20 2123 12/22/20 0500 12/22/20 0600 12/22/20 1152  BP: 103/65   111/70  Pulse: 97  (!) 108 (!) 109  Resp: 19  18 16   Temp: 98.4 F (36.9 C)  98.7 F (37.1 C) 98.7 F (37.1 C)  TempSrc: Oral  Oral Oral  SpO2: 96%  96% 96%  Weight:  55.4 kg    Height:  Intake/Output Summary (Last 24 hours) at 12/22/2020 1516 Last data filed at 12/22/2020 4196 Gross per 24 hour  Intake 360 ml  Output 2650 ml  Net -2290 ml   Filed Weights   12/17/20 0504 12/21/20 0500 12/22/20 0500  Weight: 48.5 kg 56.4 kg 55.4 kg    Examination:  General exam: Alert and comfortable, not in distress Respiratory system: Diminished air entry at bases, no wheezing or rhonchi Cardiovascular system: S1-S2 heard, regular rate rhythm Gastrointestinal system: Abdomen is soft,  nontender bowel sounds normal Central nervous system: Alert and oriented Extremities: No cyanosis Skin: No rashes,  Psychiatry: flat affect.     Data Reviewed: I have personally reviewed following labs and imaging studies  CBC: Recent Labs  Lab 12/16/20 0315 12/18/20 0501 12/21/20 0439  WBC 15.9* 14.6* 15.8*  HGB 7.4* 9.3* 8.2*  HCT 25.0* 29.7* 27.2*  MCV 91.2 88.1 90.7  PLT 378 360 222    Basic Metabolic Panel: Recent Labs  Lab 12/16/20 0315 12/17/20 0400 12/18/20 0501 12/20/20 1213 12/21/20 0439 12/22/20 0428  NA 137 135 134* 136 135 135  K 3.7 3.8 3.9 4.3 4.2 3.9  CL 107 107 107 112* 111 107  CO2 23 21* 21* 19* 20* 20*  GLUCOSE 87 89 91 90 94 102*  BUN 13 11 10 12 12 12   CREATININE 1.02 0.88 0.83 0.94 0.81 0.95  CALCIUM 11.7* 11.6* 11.4* 10.9* 11.4* 11.8*  MG 1.7 1.7 2.3 1.7 1.8  --   PHOS 1.9* 2.6 2.0* 1.6* 1.6*  --     GFR: Estimated Creatinine Clearance: 60.7 mL/min (by C-G formula based on SCr of 0.95 mg/dL).  Liver Function Tests: Recent Labs  Lab 12/16/20 0315 12/17/20 0400 12/18/20 0501 12/20/20 1213 12/21/20 0439  ALBUMIN 2.6* 2.5* 2.5* 2.3* 2.3*    CBG: Recent Labs  Lab 12/17/20 1651 12/19/20 0854 12/20/20 0752 12/21/20 0849 12/22/20 0755  GLUCAP 94 74 81 83 88     No results found for this or any previous visit (from the past 240 hour(s)).       Radiology Studies: No results found.      Scheduled Meds:  (feeding supplement) PROSource Plus  30 mL Oral BID BM   B-complex with vitamin C  1 tablet Oral Daily   Chlorhexidine Gluconate Cloth  6 each Topical Daily   dronabinol  2.5 mg Oral QAC lunch   And   dronabinol  5 mg Oral QAC supper   feeding supplement  1 Container Oral TID BM   feeding supplement  237 mL Oral BID BM   folic acid  1 mg Oral Daily   furosemide  20 mg Oral Daily   hydrocerin   Topical BID   nicotine  14 mg Transdermal Daily   phosphorus  250 mg Oral Q8H   polyethylene glycol  17  g Oral BID   senna-docusate  1 tablet Oral QHS   tamsulosin  0.4 mg Oral Nightly   cyanocobalamin  1,000 mcg Oral Daily   Continuous Infusions:  0.9 % NaCl with KCl 20 mEq / L 100 mL/hr at 12/22/20 0818     LOS: 54 days       [name], MD [facility]   To contact the attending provider between 7A-7P or the covering provider during after hours 7P-7A, please log into the web site [email or web address] and access using universal [place] password for that web site. If you do not have the password, please call the hospital operator.  12/22/2020, 3:16 PM

## 2020-12-22 NOTE — TOC Progression Note (Signed)
Transition of Care [facility]) - Progression Note    Patient Details  Name: Jamie Wilson MRN: [id number] Date of Birth: [birth year]  [facility]) CM/SW Contact  Purcell Mouton, RN Phone Number: 12/22/2020, 3:49 PM  Clinical Narrative:     Need COVID test. Pt may go to [facility] in AM if COVID test is Negative.   Expected Discharge Plan: Skilled Nursing Facility Barriers to Discharge: Continued Medical Work up  Expected Discharge Plan and Services Expected Discharge Plan: [place]         Expected Discharge Date:  (unknown)                                     Social Determinants of Health (SDOH) Interventions    Readmission Risk Interventions Readmission Risk Prevention Plan 10/04/2020  Transportation Screening Complete  PCP or Specialist Appt within 5-7 Days Complete  Home Care Screening Complete  Medication Review (RN CM) Complete

## 2020-12-22 NOTE — Plan of Care (Signed)
Patient continues with very poor appetite, refusing all supplements and refusing most meals.  Did take a few bites of a grilled cheese and drank one 4 oz juice on 7 a to 7 p shift.  Continues to c/o abdominal pain, controlled by prn oxycodone.

## 2020-12-23 ENCOUNTER — Other Ambulatory Visit: Payer: Self-pay | Admitting: Oncology

## 2020-12-23 DIAGNOSIS — C678 Malignant neoplasm of overlapping sites of bladder: Secondary | ICD-10-CM

## 2020-12-23 LAB — BASIC METABOLIC PANEL
Anion gap: 5 (ref 5–15)
BUN: 14 mg/dL (ref 8–23)
CO2: 21 mmol/L — ABNORMAL LOW (ref 22–32)
Calcium: 11.1 mg/dL — ABNORMAL HIGH (ref 8.9–10.3)
Chloride: 109 mmol/L (ref 98–111)
Creatinine, Ser: 0.89 mg/dL (ref 0.61–1.24)
GFR, Estimated: >60 mL/min (ref >60)
Glucose, Bld: 94 mg/dL (ref 70–99)
Potassium: 4.4 mmol/L (ref 3.5–5.1)
Sodium: 135 mmol/L (ref 135–145)

## 2020-12-23 LAB — CALCIUM: Calcium: 11.1 mg/dL — ABNORMAL HIGH (ref 8.9–10.3)

## 2020-12-23 LAB — GLUCOSE, CAPILLARY: Glucose-Capillary: 84 mg/dL (ref 70–99)

## 2020-12-23 MED ORDER — HYDROCERIN EX CREA: 1.0000 "application " | TOPICAL_CREAM | Freq: Two times a day (BID) | CUTANEOUS | 0 refills | Status: AC

## 2020-12-23 MED ORDER — ACETAMINOPHEN 160 MG/5ML PO SOLN: 650.0000 mg | Freq: Four times a day (QID) | ORAL | 0 refills | Status: AC | PRN

## 2020-12-23 MED ORDER — ENSURE ENLIVE PO LIQD: 237.0000 mL | Freq: Two times a day (BID) | ORAL | 12 refills | Status: AC

## 2020-12-23 MED ORDER — PROSOURCE PLUS PO LIQD: 30.0000 mL | Freq: Two times a day (BID) | ORAL | Status: AC

## 2020-12-23 MED ORDER — OXYCODONE HCL 5 MG/5ML PO SOLN: 5.0000 mg | ORAL | 0 refills | Status: AC | PRN

## 2020-12-23 MED ORDER — NICOTINE 14 MG/24HR TD PT24: 14.0000 mg | MEDICATED_PATCH | Freq: Every day | TRANSDERMAL | 0 refills | Status: AC

## 2020-12-23 MED ORDER — MEGESTROL ACETATE 400 MG/10ML PO SUSP: 400.0000 mg | Freq: Every day | ORAL | 0 refills | Status: AC

## 2020-12-23 NOTE — Discharge Summary (Signed)
Physician Discharge Summary  Leith Szafranski TKZ:[id number] DOB: Feb 11, 1956 DOA: 11/02/2020  PCP: Patient, [name]  Admit date: 11/02/2020 Discharge date: 12/23/2020  Admitted From:HOME.  Disposition:  SNF  Recommendations for Outpatient Follow-up:  1. Follow up with PCP in 1-2 weeks 2. Please obtain BMP/CBC in one week Please follow up with Dr Irene Limbo in the office as recommended and scheduled.  Please follow up with Urology as needed.  Please follow up with palliative care services at the facility on discharge.    Discharge Condition:Guarded.  CODE STATUS: full code.  Diet recommendation: regular diet.   Brief/Interim Summary: 65 year old gentleman with prior history of bladder cancer with chronic Foley placement, bowel obstruction s/p partial bowel resection with colostomy presents with abdominal pain found to have evidence secondary to UTI in the setting of necrotic bladder mass.  Urology, oncology consulted and he completed a course of 2 weeks of IV antibiotics.  Hospital course complicated by anemia, hypercalcemia, AKI and ESBL bacteremia , patient further completed another 10 days of IV meropenem for ESBL bacteremia.  Bone scan was done did not show any lytic or blastic bone lesions at this time. PT evaluation recommending SNF and TOC on board.   Discharge Diagnoses:  Principal Problem:   ESBL (extended spectrum beta-lactamase) producing bacteria infection Active Problems:   Malnutrition (HCC)   Sepsis ([place])   Protein-calorie malnutrition, severe   Lower urinary tract infectious disease   Palliative care by specialist   Goals of care, counseling/discussion  Persistent hypercalcemia Calcium on admission was 15, improved to 11. Persistently high, recommend to continue with IV fluids today and recheck calcium in am.  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 zometa x3 last dose on 12/17/2020. Recommend outpatient follow up with oncology.     Urothelial carcinoma of the bladder  with chronic indwelling Foley catheter Urology recommended that patient is not a candidate for any surgery at this time. Discussed with patient if he wants to follow up at tertiary care center like [facility] for surgery. Pt said he does not have the support system or anyone to take him to tertiary care centers and do not want to do that.  Bone scan did not show any lytic or blastic lesions at this time. Oncology recommend outpatient follow-up/ with radiation oncology referral for palliative radiation.  Replace foley catheter prior to discharge.     Sepsis secondary to E. coli UTI, present on admission and hospital course complicated by ESBL bacteremia Sepsis physiology resolved Patient completed the course of antibiotics.  Patient currently is afebrile. Persistent leukocytosis probably reactive. He does not appear to be toxic.     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 No nausea, vomiting.   AKI Resolved.   Anemia of chronic disease Transfuse to keep hemoglobin greater than 7   Hypokalemia, hypophosphatemia, hypomagnesemia Replaced.   Severe protein calorie malnutrition Secondary to chronic illness, cancer. Dietary consulted and recommendations given.    Discharge Instructions  Discharge Instructions    Diet - low sodium heart healthy   Complete by: As directed    Increase activity slowly   Complete by: As directed    No wound care   Complete by: As directed      Allergies as of 12/23/2020      Reactions   Silvadene [silver Sulfadiazine] Rash      Medication List    STOP taking these medications   acetaminophen 500 MG tablet Commonly known as: TYLENOL Replaced by: acetaminophen 160 MG/5ML  solution   bisacodyl 5 MG EC tablet Commonly known as: DULCOLAX   oxyCODONE 5 MG immediate release tablet Commonly known as: Oxy IR/ROXICODONE Replaced by: oxyCODONE 5 MG/5ML solution     TAKE these medications   (feeding  supplement) PROSource Plus liquid Take 30 mLs by mouth 2 (two) times daily between meals.   feeding supplement Liqd Take 237 mLs by mouth 2 (two) times daily between meals.   acetaminophen 160 MG/5ML solution Commonly known as: TYLENOL Take 20.3 mLs (650 mg total) by mouth every 6 (six) hours as needed for mild pain, headache or fever. Replaces: acetaminophen 500 MG tablet   B-complex with vitamin C tablet Take 1 tablet by mouth daily.   cyanocobalamin 1000 MCG tablet Take 1 tablet (1,000 mcg total) by mouth daily.   folic acid 1 MG tablet Commonly known as: FOLVITE Take 1 tablet (1 mg total) by mouth daily.   hydrocerin Crea Apply 1 application topically 2 (two) times daily.   megestrol 400 MG/10ML suspension Commonly known as: MEGACE Take 10 mLs (400 mg total) by mouth daily.   nicotine 14 mg/24hr patch Commonly known as: NICODERM CQ - dosed in mg/24 hours Place 1 patch (14 mg total) onto the skin daily. Start taking on: December 24, 2020   oxyCODONE 5 MG/5ML solution Commonly known as: ROXICODONE Take 5-7.5 mLs (5-7.5 mg total) by mouth every 4 (four) hours as needed for severe pain or moderate pain (5 mg for moderate and 7.5mg  for severe). Replaces: oxyCODONE 5 MG immediate release tablet   polyethylene glycol 17 g packet Commonly known as: MIRALAX / GLYCOLAX Take 17 g by mouth 2 (two) times daily.   senna-docusate 8.6-50 MG tablet Commonly known as: Senokot-S Take 1 tablet by mouth 2 (two) times daily.   tamsulosin 0.4 MG Caps capsule Commonly known as: FLOMAX Take 1 capsule (0.4 mg total) by mouth daily.       Contact information for after-discharge care    [facility] .   Service: Skilled Nursing Contact information: [place] [phone]                 Allergies  Allergen Reactions  . Silvadene [Silver Sulfadiazine] Rash     Consultations:  Urology  Palliative care  Oncology     Procedures/Studies: No results found. Subjective: Reports his pain is controlled by oxycodone.   Discharge Exam: Vitals:   12/22/20 1152 12/22/20 2120  BP: 111/70 103/71  Pulse: (!) 109 (!) 107  Resp: 16 20  Temp: 98.7 F (37.1 C) 97.9 F (36.6 C)  SpO2: 96% 95%   Vitals:   12/22/20 0600 12/22/20 1152 12/22/20 2120 12/23/20 0500  BP:  111/70 103/71   Pulse: (!) 108 (!) 109 (!) 107   Resp: 18 16 20    Temp: 23.3 F (37.1 C) 98.7 F (37.1 C) 97.9 F (36.6 C)   TempSrc: Oral Oral Oral   SpO2: 96% 96% 95%   Weight:    55.6 kg  Height:        General: Pt is alert, awake, not in acute distress Cardiovascular: RRR, S1/S2 +, no rubs, no gallops Respiratory: CTA bilaterally, no wheezing, no rhonchi Abdominal: Soft, NT, ND, bowel sounds + Extremities: no edema, no cyanosis    The results of significant diagnostics from this hospitalization (including imaging, microbiology, ancillary and laboratory) are listed below for reference.     Microbiology: Recent Results (  from the past 240 hour(s))  SARS CORONAVIRUS 2 (TAT 6-24 HRS) Nasopharyngeal Nasopharyngeal Swab     Status: None   Collection Time: 12/22/20  4:30 PM   Specimen: Nasopharyngeal Swab  Result Value Ref Range Status   SARS Coronavirus 2 NEGATIVE NEGATIVE Final    Comment: (NOTE) SARS-CoV-2 target nucleic acids are NOT DETECTED.  The SARS-CoV-2 RNA is generally detectable in upper and lower respiratory specimens during the acute phase of infection. Negative results do not preclude SARS-CoV-2 infection, do not rule out co-infections with other pathogens, and should not be used as the sole basis for treatment or other patient management decisions. Negative results must be combined with clinical observations, patient history, and epidemiological information. The expected result is Negative.  Fact Sheet for  Patients: SugarRoll.be  Fact Sheet for Healthcare Providers: https://www.woods-mathews.com/  This test is not yet approved or cleared by the [place] FDA and  has been authorized for detection and/or diagnosis of SARS-CoV-2 by FDA under an Emergency Use Authorization (EUA). This EUA will remain  in effect (meaning this test can be used) for the duration of the COVID-19 declaration under Se ction 564(b)(1) of the Act, 21 U.S.C. section 360bbb-3(b)(1), unless the authorization is terminated or revoked sooner.  Performed at [facility], [place] [address], [place], [place] [address]      Labs: BNP (last 3 results) No results for input(s): BNP in the last 8760 hours. Basic Metabolic Panel: Recent Labs  Lab 12/17/20 0400 12/18/20 0501 12/20/20 1213 12/21/20 0439 12/22/20 0428 12/23/20 0305  NA 135 134* 136 135 135 135  K 3.8 3.9 4.3 4.2 3.9 4.4  CL 107 107 112* 111 107 109  CO2 21* 21* 19* 20* 20* 21*  GLUCOSE 89 91 90 94 102* 94  BUN 11 10 12 12 12 14   CREATININE 0.88 0.83 0.94 0.81 0.95 0.89  CALCIUM 11.6* 11.4* 10.9* 11.4* 11.8* 11.1*  MG 1.7 2.3 1.7 1.8  --   --   PHOS 2.6 2.0* 1.6* 1.6*  --   --    Liver Function Tests: Recent Labs  Lab 12/17/20 0400 12/18/20 0501 12/20/20 1213 12/21/20 0439  ALBUMIN 2.5* 2.5* 2.3* 2.3*   No results for input(s): LIPASE, AMYLASE in the last 168 hours. No results for input(s): AMMONIA in the last 168 hours. CBC: Recent Labs  Lab 12/18/20 0501 12/21/20 0439  WBC 14.6* 15.8*  HGB 9.3* 8.2*  HCT 29.7* 27.2*  MCV 88.1 90.7  PLT 360 358   Cardiac Enzymes: No results for input(s): CKTOTAL, CKMB, CKMBINDEX, TROPONINI in the last 168 hours. BNP: Invalid input(s): POCBNP CBG: Recent Labs  Lab 12/19/20 0854 12/20/20 0752 12/21/20 0849 12/22/20 0755 12/23/20 0807  GLUCAP 74 81 83 88 84   D-Dimer No results for input(s): DDIMER in the last 72 hours. Hgb A1c No results  for input(s): HGBA1C in the last 72 hours. Lipid Profile No results for input(s): CHOL, HDL, LDLCALC, TRIG, CHOLHDL, LDLDIRECT in the last 72 hours. Thyroid function studies No results for input(s): TSH, T4TOTAL, T3FREE, THYROIDAB in the last 72 hours.  Invalid input(s): FREET3 Anemia work up No results for input(s): VITAMINB12, FOLATE, FERRITIN, TIBC, IRON, RETICCTPCT in the last 72 hours. Urinalysis    Component Value Date/Time   COLORURINE YELLOW 11/22/2020 0204   APPEARANCEUR CLOUDY (A) 11/22/2020 0204   LABSPEC 1.009 11/22/2020 0204   PHURINE 7.0 11/22/2020 0204   GLUCOSEU NEGATIVE 11/22/2020 0204   HGBUR LARGE (A) 11/22/2020 3419  BILIRUBINUR NEGATIVE 11/22/2020 [place] 11/22/2020 0204   PROTEINUR 100 (A) 11/22/2020 0204   NITRITE NEGATIVE 11/22/2020 0204   LEUKOCYTESUR MODERATE (A) 11/22/2020 0204   Sepsis Labs Invalid input(s): PROCALCITONIN,  WBC,  LACTICIDVEN Microbiology Recent Results (from the past 240 hour(s))  SARS CORONAVIRUS 2 (TAT 6-24 HRS) Nasopharyngeal Nasopharyngeal Swab     Status: None   Collection Time: 12/22/20  4:30 PM   Specimen: Nasopharyngeal Swab  Result Value Ref Range Status   SARS Coronavirus 2 NEGATIVE NEGATIVE Final    Comment: (NOTE) SARS-CoV-2 target nucleic acids are NOT DETECTED.  The SARS-CoV-2 RNA is generally detectable in upper and lower respiratory specimens during the acute phase of infection. Negative results do not preclude SARS-CoV-2 infection, do not rule out co-infections with other pathogens, and should not be used as the sole basis for treatment or other patient management decisions. Negative results must be combined with clinical observations, patient history, and epidemiological information. The expected result is Negative.  Fact Sheet for Patients: SugarRoll.be  Fact Sheet for Healthcare Providers: https://www.woods-mathews.com/  This test is not yet  approved or cleared by the [place] FDA and  has been authorized for detection and/or diagnosis of SARS-CoV-2 by FDA under an Emergency Use Authorization (EUA). This EUA will remain  in effect (meaning this test can be used) for the duration of the COVID-19 declaration under Se ction 564(b)(1) of the Act, 21 U.S.C. section 360bbb-3(b)(1), unless the authorization is terminated or revoked sooner.  Performed at [facility], [place] [address], [place], [place] [address]      Time coordinating discharge: 33 minutes.  SIGNED:   [name], MD  [facility] 12/23/2020, 12:44 PM

## 2020-12-23 NOTE — Progress Notes (Signed)
Report called to Georgiann Cocker RN.  [facility] called for transport.  Possible that patient may not get transferred until tomorrow.  Will leave IV and fluids running until patient is leaving hospital per MD.

## 2020-12-23 NOTE — TOC Progression Note (Signed)
Transition of Care [facility]) - Progression Note    Patient Details  Name: Jamie Wilson MRN: [id number] Date of Birth: [birth year]  [facility]) CM/SW Contact  Purcell Mouton, RN Phone Number: 12/23/2020, 1:44 PM  Clinical Narrative:    Pt's friend Randall Hiss was call to make aware that pt would transfer to [facility] SNF, telephone was given.    Expected Discharge Plan: Skilled Nursing Facility Barriers to Discharge: Continued Medical Work up  Expected Discharge Plan and Services Expected Discharge Plan: [place]         Expected Discharge Date: 12/23/20                                     Social Determinants of Health (SDOH) Interventions    Readmission Risk Interventions Readmission Risk Prevention Plan 10/04/2020  Transportation Screening Complete  PCP or Specialist Appt within 5-7 Days Complete  Home Care Screening Complete  Medication Review (RN CM) Complete

## 2020-12-25 LAB — PTH-RELATED PEPTIDE: PTH-related peptide: 4.8 pmol/L — ABNORMAL HIGH

## 2021-01-02 ENCOUNTER — Other Ambulatory Visit: Payer: Medicaid Other

## 2021-01-02 ENCOUNTER — Ambulatory Visit: Payer: Medicaid Other | Admitting: Hematology

## 2021-01-12 NOTE — Progress Notes (Incomplete)
HEMATOLOGY/ONCOLOGY CONSULTATION NOTE  Date of Service: 01/12/2021  Patient Care Team: Patient, [name] as PCP - General ([place]) Freada Bergeron, MD as PCP - Cardiology (Cardiology)  CHIEF COMPLAINTS/PURPOSE OF CONSULTATION:  Bladder Cancer  HISTORY OF PRESENTING ILLNESS:  Jamie Wilson is a wonderful 65 y.o. male who is here after being seen in the hospital for evaluation and management of bladder cancer.  The pt reports that he feels that his kidney stones have resolved. He continues to report white mucous in his catheter that is about the same volume as before. He has started moving his bowels again. Pt is having difficulty walking due to severe Gout which has left him bedbound. Pt is having discomfort in many of his toes. This flare started upon his hospital discharge. He is scheduled for his first visit with a PCP on 11/07/2020. The pt is taking a significant amount of Tylenol to control his lower abdominal pain. His appetite has been lacking and he has lost weight since hospitalization. He also does not appear to have access to adequate nutrition.   Of note prior to the patient's visit today, pt has had CT Abd/Pel ([id number]) completed on 10/08/2020 with results revealing "1. Irregular thickening of the bladder wall with enhancement. Gas and nonenhancing material centrally within the bladder. Bladder neoplasm is certainly a consideration. 2. Bilateral nephrolithiasis. No ureterolithiasis or obstructive uropathy. 3. 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 4. Aortic Atherosclerosis (ICD10-I70.0)."  Pt has had CT Chest ([id number]) completed on 10/11/2020 with results revealing "1. Borderline enlarged mediastinal and hilar lymph nodes. These are likely related to the patient has emphysema but attention on future studies or PET-CT may be helpful for further evaluation. 2. No findings for pulmonary metastatic disease. 3. Emphysematous changes and pulmonary  scarring. 4. Debris in the trachea, likely mucous. 5. Stable hepatic and left renal cysts. 6. Emphysema and aortic atherosclerosis."  On review of systems, pt reports lower abdominal discomfort, night sweats, weight loss, low appetite, SOB and denies hematuria, fevers, chills, constipation and any other symptoms.   On PMHx the pt reports Transurethral Resection of Bladder Tumor, Colostomy.  INTERVAL HISTORY Jamie Wilson is a wonderful 65 y.o. male who is here for evaluation and management of bladder cancer. The patient's last visit with [place] was on 11/01/2020. The pt reports that he is doing well overall.  The pt reports ***  Lab results today 01/13/2021 of CBC w/diff and CMP is as follows: all values are WNL except for ***  On review of systems, pt reports *** and denies *** and any other symptoms.   MEDICAL HISTORY:  Past Medical History:  Diagnosis Date  . Bladder cancer ([place])   . Colostomy care ([place])   . Toe infection     SURGICAL HISTORY: Past Surgical History:  Procedure Laterality Date  . ABDOMINAL SURGERY    . CYSTOSCOPY/URETEROSCOPY/HOLMIUM LASER/STENT PLACEMENT Right 10/06/2020   Procedure: CYSTO;  Surgeon: Irine Seal, MD;  Location: [facility];  Service: Urology;  Laterality: Right;  . TRANSURETHRAL RESECTION OF BLADDER TUMOR N/A 10/06/2020   Procedure: POSSIBLE TRANSURETHRAL RESECTION OF BLADDER TUMOR (TURBT);  Surgeon: Irine Seal, MD;  Location: [facility];  Service: Urology;  Laterality: N/A;    SOCIAL HISTORY: Social History   Socioeconomic History  . Marital status: Divorced    Spouse name: Not on file  . Number of children: Not on file  . Years of education: Not on file  . Highest education level: Not on  file  Occupational History  . Not on file  Tobacco Use  . Smoking status: Current Some Day Smoker  . Smokeless tobacco: Never Used  Substance and Sexual Activity  . Alcohol use: Yes  . Drug use: Yes    Types: Marijuana  . Sexual activity: Not on file  Other  Topics Concern  . Not on file  Social History Narrative  . Not on file   Social Determinants of Health   Financial Resource Strain: Not on file  Food Insecurity: Not on file  Transportation Needs: Not on file  Physical Activity: Not on file  Stress: Not on file  Social Connections: Not on file  Intimate Partner Violence: Not on file    FAMILY HISTORY: Family History  Problem Relation Age of Onset  . CAD Neg Hx   . Heart failure Neg Hx     ALLERGIES:  is allergic to silvadene [silver sulfadiazine].  MEDICATIONS:  Current Outpatient Medications  Medication Sig Dispense Refill  . acetaminophen (TYLENOL) 160 MG/5ML solution Take 20.3 mLs (650 mg total) by mouth every 6 (six) hours as needed for mild pain, headache or fever. 120 mL 0  . B Complex-C (B-COMPLEX WITH VITAMIN C) tablet Take 1 tablet by mouth daily. 100 tablet 0  . feeding supplement (ENSURE ENLIVE / ENSURE PLUS) LIQD Take 237 mLs by mouth 2 (two) times daily between meals. 681 mL 12  . folic acid (FOLVITE) 1 MG tablet Take 1 tablet (1 mg total) by mouth daily. 100 tablet 0  . hydrocerin (EUCERIN) CREA Apply 1 application topically 2 (two) times daily.  0  . megestrol (MEGACE) 400 MG/10ML suspension Take 10 mLs (400 mg total) by mouth daily. 240 mL 0  . nicotine (NICODERM CQ - DOSED IN MG/24 HOURS) 14 mg/24hr patch Place 1 patch (14 mg total) onto the skin daily. 28 patch 0  . Nutritional Supplements (,FEEDING SUPPLEMENT, PROSOURCE PLUS) liquid Take 30 mLs by mouth 2 (two) times daily between meals.    Marland Kitchen oxyCODONE (ROXICODONE) 5 MG/5ML solution Take 5-7.5 mLs (5-7.5 mg total) by mouth every 4 (four) hours as needed for severe pain or moderate pain (5 mg for moderate and 7.5mg  for severe). 15 mL 0  . polyethylene glycol (MIRALAX / GLYCOLAX) 17 g packet Take 17 g by mouth 2 (two) times daily. (Patient not taking: No sig reported) 14 each 0  . senna-docusate (SENOKOT-S) 8.6-50 MG tablet Take 1 tablet by mouth 2 (two) times  daily. (Patient not taking: Reported on 11/03/2020) 60 tablet 2  . tamsulosin (FLOMAX) 0.4 MG CAPS capsule Take 1 capsule (0.4 mg total) by mouth daily. 30 capsule 2  . vitamin B-12 1000 MCG tablet Take 1 tablet (1,000 mcg total) by mouth daily. 100 tablet 0   No current facility-administered medications for this visit.    REVIEW OF SYSTEMS:   10 Point review of Systems was done is negative except as noted above.  PHYSICAL EXAMINATION: ECOG PERFORMANCE STATUS: 4 - Bedbound  .There were no vitals filed for this visit. There were no vitals filed for this visit. .There is no height or weight on file to calculate BMI.   *** GENERAL:alert, in no acute distress and comfortable SKIN: no acute rashes, no significant lesions EYES: conjunctiva are pink and non-injected, sclera anicteric OROPHARYNX: MMM, no exudates, no oropharyngeal erythema or ulceration NECK: supple, no JVD LYMPH:  no palpable lymphadenopathy in the cervical, axillary or inguinal regions LUNGS: clear to auscultation b/l with normal respiratory  effort HEART: regular rate & rhythm ABDOMEN:  normoactive bowel sounds , non tender, not distended. Extremity: no pedal edema PSYCH: alert & oriented x 3 with fluent speech NEURO: no focal motor/sensory deficits  LABORATORY DATA:  I have reviewed the data as listed  . CBC Latest Ref Rng & Units 12/21/2020 12/18/2020 12/16/2020  WBC 4.0 - 10.5 K/uL 15.8(H) 14.6(H) 15.9(H)  Hemoglobin 13.0 - 17.0 g/dL 8.2(L) 9.3(L) 7.4(L)  Hematocrit 39.0 - 52.0 % 27.2(L) 29.7(L) 25.0(L)  Platelets 150 - 400 K/uL 358 360 378    . CMP Latest Ref Rng & Units 12/23/2020 12/23/2020 12/22/2020  Glucose 70 - 99 mg/dL - 94 102(H)  BUN 8 - 23 mg/dL - 14 12  Creatinine 0.61 - 1.24 mg/dL - 0.89 0.95  Sodium 135 - 145 mmol/L - 135 135  Potassium 3.5 - 5.1 mmol/L - 4.4 3.9  Chloride 98 - 111 mmol/L - 109 107  CO2 22 - 32 mmol/L - 21(L) 20(L)  Calcium 8.9 - 10.3 mg/dL 11.1(H) 11.1(H) 11.8(H)  Total Protein  6.5 - 8.1 g/dL - - -  Total Bilirubin 0.3 - 1.2 mg/dL - - -  Alkaline Phos 38 - 126 U/L - - -  AST 15 - 41 U/L - - -  ALT 0 - 44 U/L - - -     RADIOGRAPHIC STUDIES: I have personally reviewed the radiological images as listed and agreed with the findings in the report. No results found.  ASSESSMENT & PLAN:   65 yo with   1) newly diagnosed muscle invasive bladder cancer tx,nx,Mx , poorly , multiple morphologies. 2) ?mediastinal/hilar LN - borderline ? Mets 3) poor functional status ECOG PS 3 4) Severe protein calorie malnutrition 5) Poor social support system-- was incarcerated for a long time and currently lives in halfway house. 6) Recurent UTI's-- recently with hematuria and sepsis.  PLAN: -Discussed pt labwork today, 01/13/2021; ***  -Currently patient is not a good candidate for neoadjuvant cisplatin based chemotherapy. Will need to have urology weigh in regarding candidacy for cystectomy followed by adjuvant rx options. Alternative might need to consider chemo RT or palliative RT alone. -Will see back in ***   FOLLOW UP: ***   All of the patients questions were answered with apparent satisfaction. The patient knows to call the clinic with any problems, questions or concerns.   The total time spent in the appointment was *** minutes and more than 50% was on counseling and direct patient cares.     Sullivan Lone MD Bernice AAHIVMS [facility] Hematology/Oncology Physician [facility]  (Office):       [phone] (Work cell):  [phone] (Fax):           [phone]  01/12/2021 9:29 PM  I, Reinaldo Raddle, am acting as scribe for Dr. Sullivan Lone, MD.

## 2021-01-13 ENCOUNTER — Telehealth: Payer: Self-pay

## 2021-01-13 ENCOUNTER — Inpatient Hospital Stay: Payer: Medicaid Other | Admitting: Hematology

## 2021-01-13 ENCOUNTER — Inpatient Hospital Stay: Payer: Medicaid Other | Attending: Hematology

## 2021-01-13 NOTE — Telephone Encounter (Signed)
Pt. not seen at his Lab/MD visit today. TCT pt. with no response, LVM.

## 2021-01-27 DEATH — deceased

## 2022-02-07 IMAGING — CT CT RENAL STONE PROTOCOL
2 of 4 series · 15 of 46 positions shown, 17 images · non-contrast
Comparison: None.

CLINICAL DATA: Bilateral flank pain

EXAM:
CT ABDOMEN AND PELVIS WITHOUT CONTRAST
TECHNIQUE: Multidetector CT imaging of the abdomen and pelvis was performed
following the standard protocol without IV contrast.

[Series 2: axial st · axial · 0.70mm/px · z∈[-524,-89]mm · 12 of 99 slices shown, 14 images]
[im 6/99  soft-tissue]
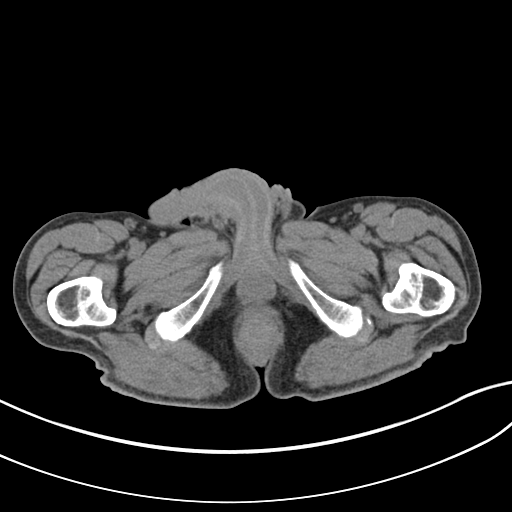
[im 6/99  bone]
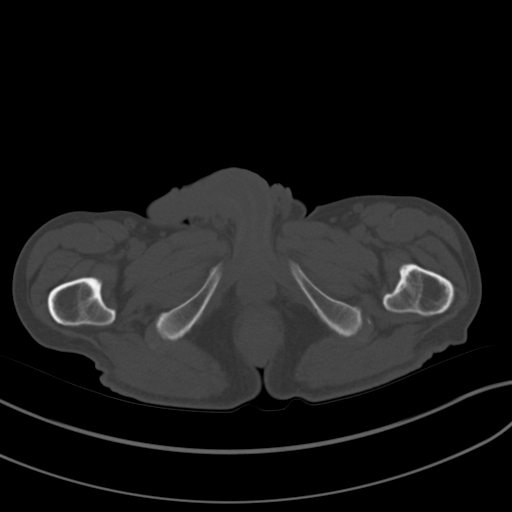
[im 17/99  soft-tissue]
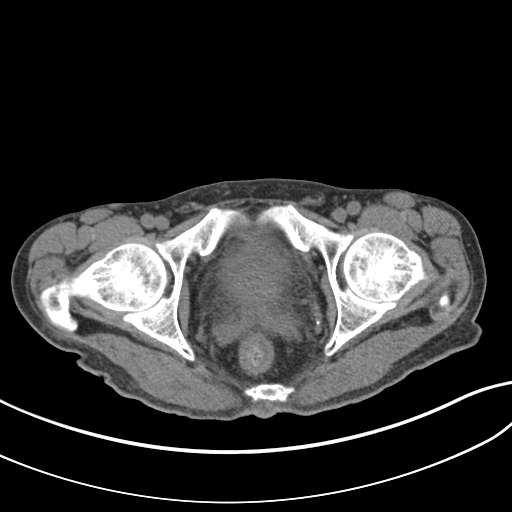
[im 22/99  soft-tissue]
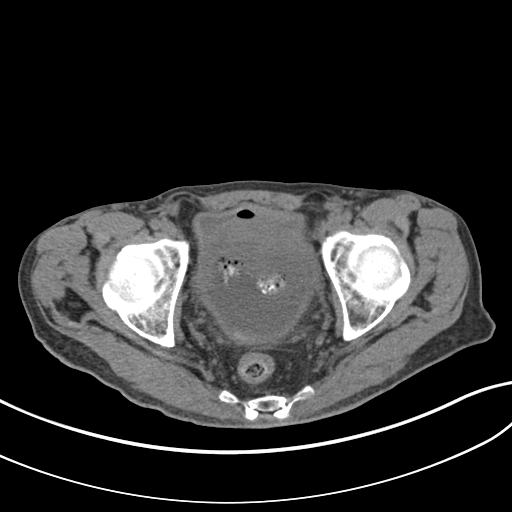
[im 28/99  soft-tissue]
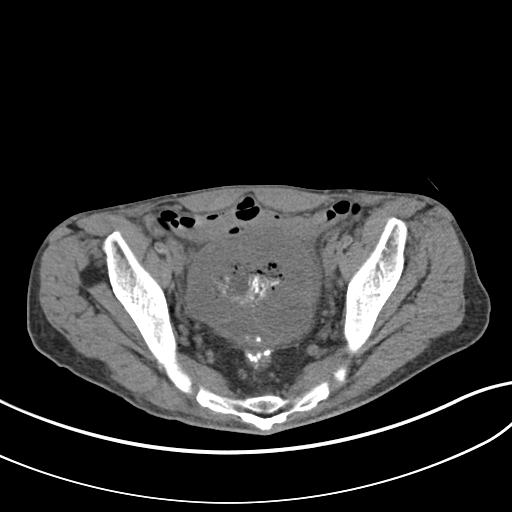
[im 39/99  soft-tissue]
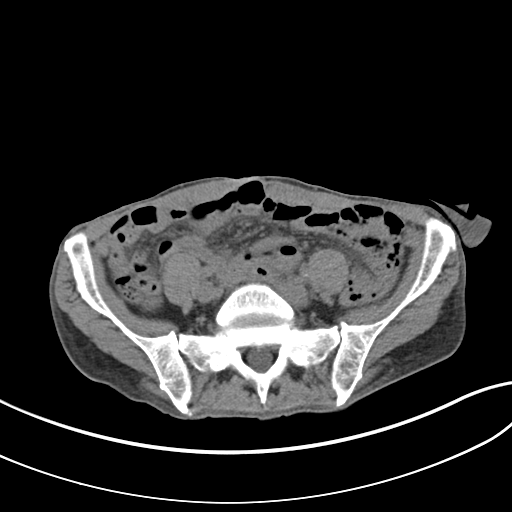
[im 44/99  soft-tissue]
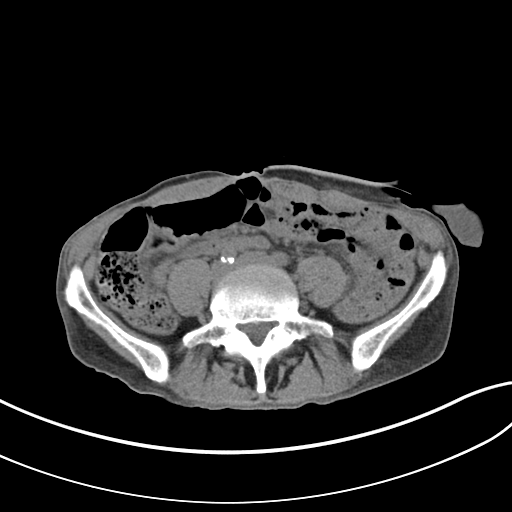
[im 55/99  soft-tissue]
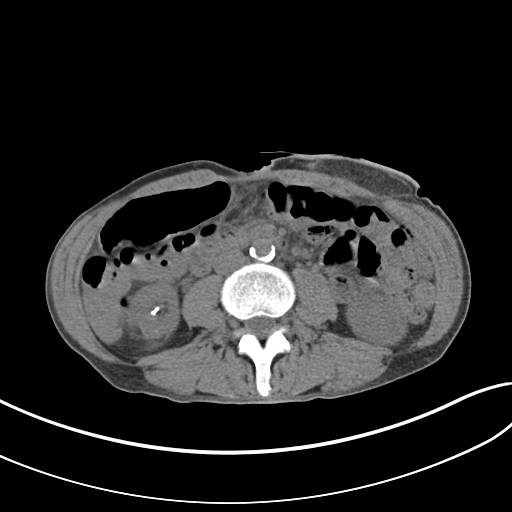
[im 60/99  soft-tissue]
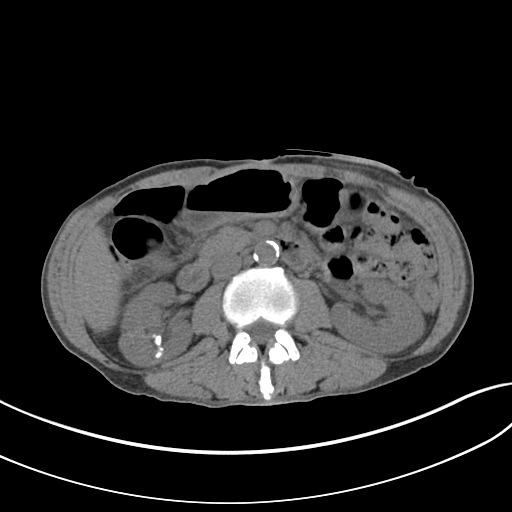
[im 71/99  soft-tissue]
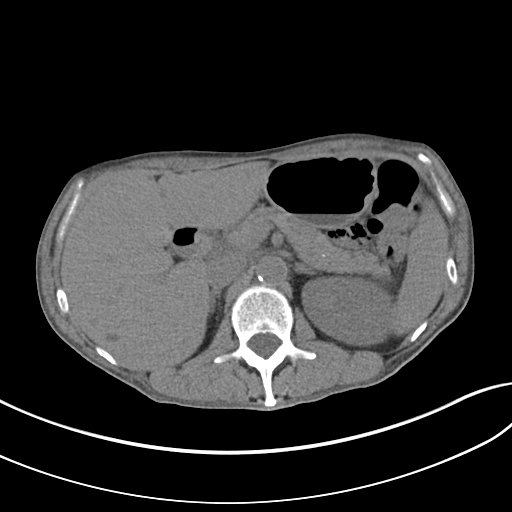
[im 71/99  bone]
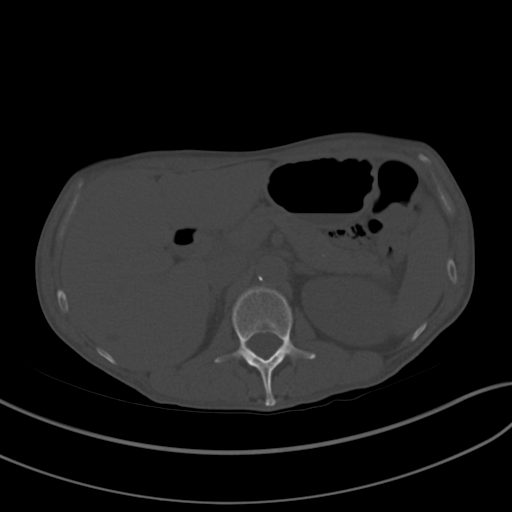
[im 77/99  soft-tissue]
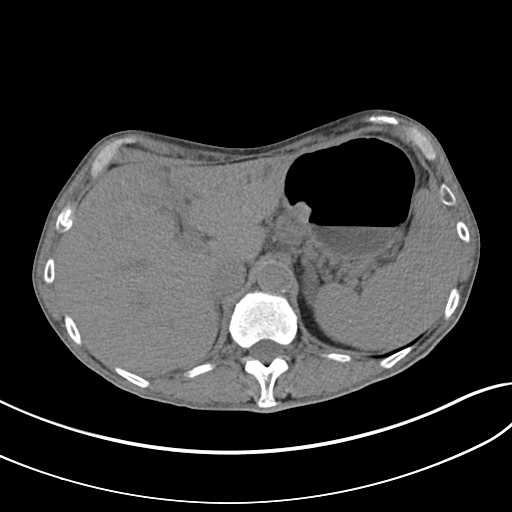
[im 82/99  soft-tissue]
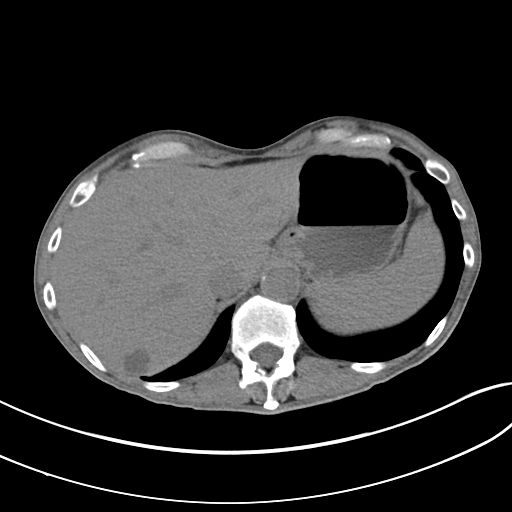
[im 93/99  soft-tissue]
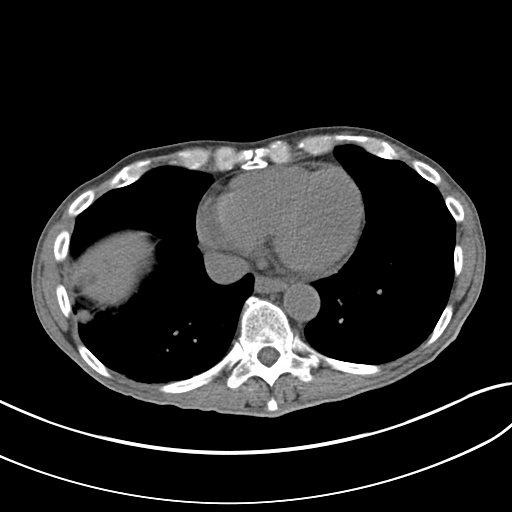

[Series 5: coronal · coronal · 0.68mm/px · 3 of 114 slices shown]
[im 38/114  soft-tissue]
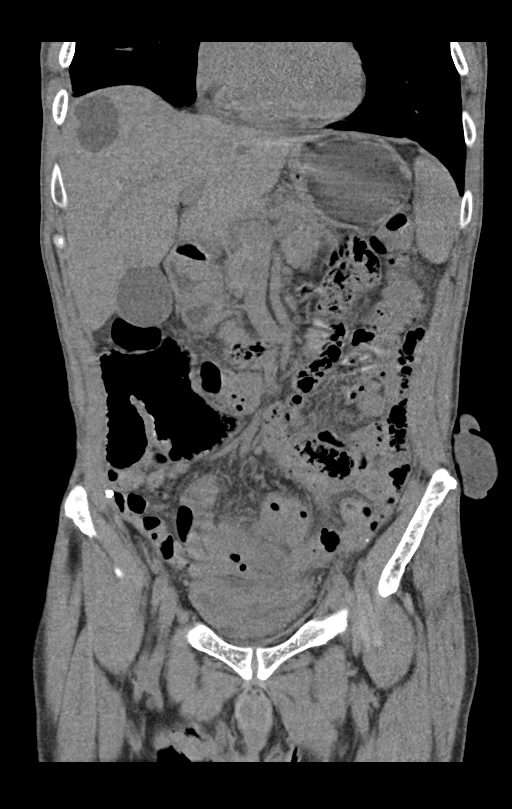
[im 51/114  soft-tissue]
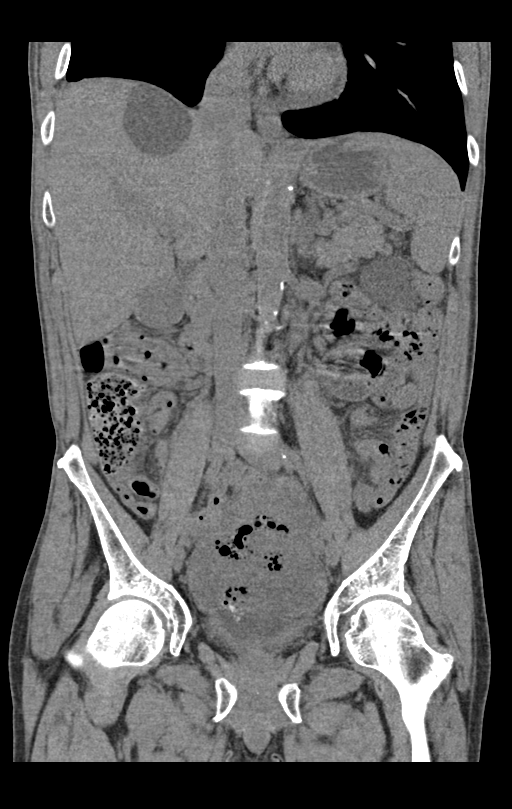
[im 63/114  soft-tissue]
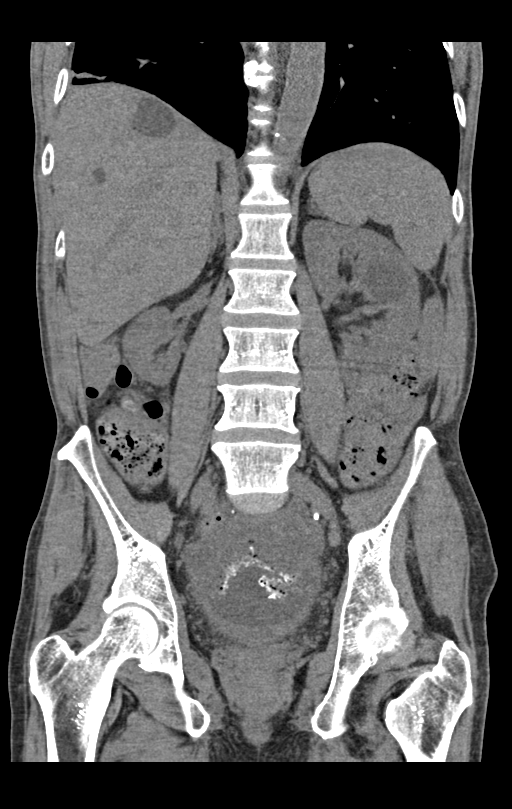

[15 of 46 positions shown; findings below may reference images not displayed]

FINDINGS: Lower chest: The visualized heart size within normal limits. No
pericardial fluid/thickening.

No hiatal hernia.

The visualized portions of the lungs are clear.

Hepatobiliary: Multiple low-density lesions are seen throughout the
liver parenchyma the largest in the right hepatic dome measures
cm. No evidence of calcified gallstones or biliary ductal
dilatation.

Pancreas:  Unremarkable.  No surrounding inflammatory changes.

Spleen: Normal in size. Although limited due to the lack of
intravenous contrast, normal in appearance.

Adrenals/Urinary Tract: Both adrenal glands appear normal there is
mild right-sided pelviectasis with stranding changes. There are
multiple coarse calcifications throughout the right kidney the
largest measuring 8 mm within the upper pole. Within the proximal
right ureter there is a 3 mm calculus present. There is
calcifications seen in the lower pole the left kidney. Again noted
within the bladder is heterogeneous soft tissue masslike area and
wall thickening extending superiorly which appears to have a
fistulous connection to the overlying small bowel and colonic loops.
There now appears to be small foci of air and debris.

Stomach/Bowel: The stomach and small bowel are unremarkable. There
is scattered colonic diverticula with a moderate amount of colonic
stool present. A left lower quadrant ostomy is present.

Vascular/Lymphatic: There are no enlarged abdominal or pelvic lymph
nodes. Scattered aortic atherosclerotic calcifications are seen
without aneurysmal dilatation.

Reproductive: The patient is status post hysterectomy. No adnexal
masses or collections seen.

Other: No evidence of abdominal wall mass or hernia.

Musculoskeletal: No acute or significant osseous findings.
IMPRESSION: Heterogeneous soft tissue mass extending from the bladder superiorly
which appears to likely have a fistulous connection to the overlying
small bowel and colonic loops, concerning for bladder neoplasm.

Mild right pelviectasis with a proximal 3 mm ureteral calculi.

Multiple bilateral non-obstructing renal calculi.

Aortic Atherosclerosis ([id number]-BSA.A).
